# Patient Record
Sex: Male | Born: 1940 | Race: Black or African American | Hispanic: No | Smoking: Former smoker
Health system: Southern US, Community
[De-identification: ages and names within clinical notes are randomized; demographics above are authoritative.]

## PROBLEM LIST (undated history)

## (undated) DIAGNOSIS — I10 Essential (primary) hypertension: Secondary | ICD-10-CM

## (undated) DIAGNOSIS — M889 Osteitis deformans of unspecified bone: Secondary | ICD-10-CM

## (undated) DIAGNOSIS — N200 Calculus of kidney: Secondary | ICD-10-CM

## (undated) DIAGNOSIS — F209 Schizophrenia, unspecified: Secondary | ICD-10-CM

## (undated) DIAGNOSIS — W19XXXA Unspecified fall, initial encounter: Secondary | ICD-10-CM

## (undated) DIAGNOSIS — E7800 Pure hypercholesterolemia, unspecified: Secondary | ICD-10-CM

## (undated) DIAGNOSIS — IMO0001 Reserved for inherently not codable concepts without codable children: Secondary | ICD-10-CM

## (undated) DIAGNOSIS — Z87898 Personal history of other specified conditions: Secondary | ICD-10-CM

## (undated) DIAGNOSIS — K219 Gastro-esophageal reflux disease without esophagitis: Secondary | ICD-10-CM

## (undated) DIAGNOSIS — K8020 Calculus of gallbladder without cholecystitis without obstruction: Secondary | ICD-10-CM

## (undated) DIAGNOSIS — K565 Intestinal adhesions [bands], unspecified as to partial versus complete obstruction: Secondary | ICD-10-CM

## (undated) HISTORY — PX: BACK SURGERY: SHX140

## (undated) HISTORY — DX: Gastro-esophageal reflux disease without esophagitis: K21.9

## (undated) HISTORY — PX: PELVIC FRACTURE SURGERY: SHX119

## (undated) HISTORY — DX: Personal history of other specified conditions: Z87.898

## (undated) HISTORY — DX: Intestinal adhesions (bands), unspecified as to partial versus complete obstruction: K56.50

## (undated) HISTORY — DX: Calculus of kidney: N20.0

## (undated) HISTORY — PX: TOTAL HIP ARTHROPLASTY: SHX124

## (undated) HISTORY — PX: LIVER SURGERY: SHX698

## (undated) HISTORY — DX: Calculus of gallbladder without cholecystitis without obstruction: K80.20

## (undated) HISTORY — DX: Osteitis deformans of unspecified bone: M88.9

---

## 2013-03-16 ENCOUNTER — Emergency Department (HOSPITAL_COMMUNITY): Payer: Medicare (Managed Care)

## 2013-03-16 ENCOUNTER — Encounter (HOSPITAL_COMMUNITY): Payer: Self-pay | Admitting: Emergency Medicine

## 2013-03-16 ENCOUNTER — Inpatient Hospital Stay (HOSPITAL_COMMUNITY)
Admission: EM | Admit: 2013-03-16 | Discharge: 2013-03-27 | DRG: 388 | Disposition: A | Discharge status: Skilled Nursing Facility | Payer: Medicare (Managed Care) | Attending: Internal Medicine | Admitting: Internal Medicine

## 2013-03-16 DIAGNOSIS — E872 Acidosis, unspecified: Secondary | ICD-10-CM | POA: Diagnosis present

## 2013-03-16 DIAGNOSIS — E785 Hyperlipidemia, unspecified: Secondary | ICD-10-CM | POA: Diagnosis present

## 2013-03-16 DIAGNOSIS — F209 Schizophrenia, unspecified: Secondary | ICD-10-CM | POA: Diagnosis present

## 2013-03-16 DIAGNOSIS — Z7902 Long term (current) use of antithrombotics/antiplatelets: Secondary | ICD-10-CM

## 2013-03-16 DIAGNOSIS — Z96649 Presence of unspecified artificial hip joint: Secondary | ICD-10-CM

## 2013-03-16 DIAGNOSIS — K5900 Constipation, unspecified: Secondary | ICD-10-CM | POA: Diagnosis present

## 2013-03-16 DIAGNOSIS — K8020 Calculus of gallbladder without cholecystitis without obstruction: Secondary | ICD-10-CM | POA: Diagnosis present

## 2013-03-16 DIAGNOSIS — I129 Hypertensive chronic kidney disease with stage 1 through stage 4 chronic kidney disease, or unspecified chronic kidney disease: Secondary | ICD-10-CM | POA: Diagnosis present

## 2013-03-16 DIAGNOSIS — Z87891 Personal history of nicotine dependence: Secondary | ICD-10-CM

## 2013-03-16 DIAGNOSIS — Z79899 Other long term (current) drug therapy: Secondary | ICD-10-CM

## 2013-03-16 DIAGNOSIS — E8729 Other acidosis: Secondary | ICD-10-CM | POA: Diagnosis present

## 2013-03-16 DIAGNOSIS — N179 Acute kidney failure, unspecified: Secondary | ICD-10-CM | POA: Diagnosis present

## 2013-03-16 DIAGNOSIS — N289 Disorder of kidney and ureter, unspecified: Secondary | ICD-10-CM

## 2013-03-16 DIAGNOSIS — I959 Hypotension, unspecified: Secondary | ICD-10-CM

## 2013-03-16 DIAGNOSIS — E876 Hypokalemia: Secondary | ICD-10-CM | POA: Diagnosis not present

## 2013-03-16 DIAGNOSIS — I252 Old myocardial infarction: Secondary | ICD-10-CM

## 2013-03-16 DIAGNOSIS — N390 Urinary tract infection, site not specified: Secondary | ICD-10-CM | POA: Diagnosis present

## 2013-03-16 DIAGNOSIS — I10 Essential (primary) hypertension: Secondary | ICD-10-CM

## 2013-03-16 DIAGNOSIS — F2089 Other schizophrenia: Secondary | ICD-10-CM | POA: Diagnosis present

## 2013-03-16 DIAGNOSIS — R339 Retention of urine, unspecified: Secondary | ICD-10-CM | POA: Diagnosis not present

## 2013-03-16 DIAGNOSIS — K56609 Unspecified intestinal obstruction, unspecified as to partial versus complete obstruction: Principal | ICD-10-CM | POA: Diagnosis present

## 2013-03-16 DIAGNOSIS — N2581 Secondary hyperparathyroidism of renal origin: Secondary | ICD-10-CM | POA: Diagnosis present

## 2013-03-16 DIAGNOSIS — N189 Chronic kidney disease, unspecified: Secondary | ICD-10-CM | POA: Diagnosis present

## 2013-03-16 DIAGNOSIS — R17 Unspecified jaundice: Secondary | ICD-10-CM | POA: Diagnosis present

## 2013-03-16 DIAGNOSIS — N200 Calculus of kidney: Secondary | ICD-10-CM

## 2013-03-16 DIAGNOSIS — N170 Acute kidney failure with tubular necrosis: Secondary | ICD-10-CM | POA: Diagnosis present

## 2013-03-16 HISTORY — DX: Essential (primary) hypertension: I10

## 2013-03-16 HISTORY — DX: Schizophrenia, unspecified: F20.9

## 2013-03-16 HISTORY — DX: Pure hypercholesterolemia, unspecified: E78.00

## 2013-03-16 LAB — CBC WITH DIFFERENTIAL/PLATELET
Basophils Absolute: 0 10*3/uL (ref 0.0–0.1)
Eosinophils Absolute: 0.1 10*3/uL (ref 0.0–0.7)
Hemoglobin: 14.1 g/dL (ref 13.0–17.0)
Lymphs Abs: 0.6 10*3/uL — ABNORMAL LOW (ref 0.7–4.0)
MCH: 29.9 pg (ref 26.0–34.0)
MCHC: 36 g/dL (ref 30.0–36.0)
Monocytes Relative: 20 % — ABNORMAL HIGH (ref 3–12)
Neutro Abs: 4.5 10*3/uL (ref 1.7–7.7)
Neutrophils Relative %: 70 % (ref 43–77)
Platelets: 148 10*3/uL — ABNORMAL LOW (ref 150–400)
RBC: 4.72 MIL/uL (ref 4.22–5.81)
WBC: 6.4 10*3/uL (ref 4.0–10.5)

## 2013-03-16 LAB — COMPREHENSIVE METABOLIC PANEL
ALT: 12 U/L (ref 0–53)
AST: 28 U/L (ref 0–37)
Alkaline Phosphatase: 118 U/L — ABNORMAL HIGH (ref 39–117)
CO2: 15 mEq/L — ABNORMAL LOW (ref 19–32)
Chloride: 99 mEq/L (ref 96–112)
GFR calc non Af Amer: 10 mL/min — ABNORMAL LOW (ref >90)
Potassium: 4.4 mEq/L (ref 3.5–5.1)
Sodium: 135 mEq/L (ref 135–145)
Total Bilirubin: 3.3 mg/dL — ABNORMAL HIGH (ref 0.3–1.2)

## 2013-03-16 LAB — URINALYSIS, ROUTINE W REFLEX MICROSCOPIC
Hgb urine dipstick: NEGATIVE
Ketones, ur: 15 mg/dL — AB
Nitrite: POSITIVE — AB
Protein, ur: NEGATIVE mg/dL
Urobilinogen, UA: 1 mg/dL (ref 0.0–1.0)

## 2013-03-16 LAB — URINE MICROSCOPIC-ADD ON

## 2013-03-16 LAB — POCT I-STAT TROPONIN I

## 2013-03-16 MED ORDER — MIRTAZAPINE 30 MG PO TABS
30.0000 mg | ORAL_TABLET | Freq: Every day | ORAL | Status: DC
  Administered 2013-03-17 – 2013-03-26 (×10): 30 mg via ORAL
  Filled 2013-03-16 (×14): qty 1

## 2013-03-16 MED ORDER — SODIUM CHLORIDE 0.9 % IV BOLUS (SEPSIS)
1000.0000 mL | Freq: Once | INTRAVENOUS | Status: AC
  Administered 2013-03-16: 1000 mL via INTRAVENOUS

## 2013-03-16 MED ORDER — SODIUM CHLORIDE 0.9 % IV SOLN
INTRAVENOUS | Status: DC
  Administered 2013-03-16: 18:00:00 via INTRAVENOUS

## 2013-03-16 MED ORDER — ONDANSETRON HCL 4 MG PO TABS: 4.0000 mg | ORAL_TABLET | Freq: Four times a day (QID) | ORAL | Status: DC | PRN

## 2013-03-16 MED ORDER — HEPARIN SODIUM (PORCINE) 5000 UNIT/ML IJ SOLN
5000.0000 [IU] | Freq: Three times a day (TID) | INTRAMUSCULAR | Status: DC
  Administered 2013-03-17 – 2013-03-27 (×32): 5000 [IU] via SUBCUTANEOUS
  Filled 2013-03-16 (×39): qty 1

## 2013-03-16 MED ORDER — RISPERIDONE 3 MG PO TABS
3.0000 mg | ORAL_TABLET | Freq: Every day | ORAL | Status: DC
  Administered 2013-03-17 – 2013-03-26 (×10): 3 mg via ORAL
  Filled 2013-03-16 (×15): qty 1

## 2013-03-16 MED ORDER — ONDANSETRON HCL 4 MG/2ML IJ SOLN
4.0000 mg | Freq: Once | INTRAMUSCULAR | Status: AC
  Administered 2013-03-16: 4 mg via INTRAVENOUS
  Filled 2013-03-16: qty 2

## 2013-03-16 MED ORDER — ONDANSETRON HCL 4 MG/2ML IJ SOLN: 4.0000 mg | Freq: Four times a day (QID) | INTRAMUSCULAR | Status: DC | PRN

## 2013-03-16 MED ORDER — SODIUM CHLORIDE 0.9 % IV SOLN
INTRAVENOUS | Status: DC
  Administered 2013-03-17: 02:00:00 via INTRAVENOUS

## 2013-03-16 MED ORDER — DEXTROSE 5 % IV SOLN
1.0000 g | Freq: Once | INTRAVENOUS | Status: AC
  Administered 2013-03-16: 1 g via INTRAVENOUS
  Filled 2013-03-16: qty 10

## 2013-03-16 MED ORDER — FENTANYL CITRATE 0.05 MG/ML IJ SOLN
12.5000 ug | Freq: Once | INTRAMUSCULAR | Status: AC
  Administered 2013-03-16: 12.5 ug via INTRAVENOUS
  Filled 2013-03-16: qty 2

## 2013-03-16 MED ORDER — SODIUM CHLORIDE 0.9 % IV SOLN: INTRAVENOUS | Status: DC

## 2013-03-16 MED ORDER — PANTOPRAZOLE SODIUM 40 MG PO TBEC: 40.0000 mg | DELAYED_RELEASE_TABLET | Freq: Every day | ORAL | Status: DC

## 2013-03-16 NOTE — Consult Note (Signed)
Reason for Consult:SBO Referring Physician: Dr Gwyneth Sprout  Norman Davis is an 72 y.o. male.  HPI: pt is a poor historian.  Asked to see at request of Dr Anitra Lauth for abdominal pain.  Pt states 3 week history of abdominal pain diffuse constant.  Last BM two  weeks ago.  Nausea and vomiting. History fragmented and he is unclear on the events. Previous surgery includes some sort of liver  And  bowel resection after accident. CT shows SBO and a lot of stool in colon as well.  Not clear if he is having flatus.    Past Medical History  Diagnosis Date  . Hypertension   . Hypercholesteremia   . Myocardial infarction   . Schizophrenia     Past Surgical History  Procedure Laterality Date  . Total hip arthroplasty    . Pelvic fracture surgery    . Liver surgery      History reviewed. No pertinent family history.  Social History:  reports that he has quit smoking. He has never used smokeless tobacco. He reports that he does not drink alcohol or use illicit drugs.  Allergies:  Allergies  Allergen Reactions  . Aspirin     unknown  . Penicillins     unknown    Medications: I have reviewed the patient's current medications.  Results for orders placed during the hospital encounter of 03/16/13 (from the past 48 hour(s))  CBC WITH DIFFERENTIAL     Status: Abnormal   Collection Time    03/16/13 12:19 PM      Result Value Range   WBC 6.4  4.0 - 10.5 K/uL   RBC 4.72  4.22 - 5.81 MIL/uL   Hemoglobin 14.1  13.0 - 17.0 g/dL   HCT 16.1  09.6 - 04.5 %   MCV 83.1  78.0 - 100.0 fL   MCH 29.9  26.0 - 34.0 pg   MCHC 36.0  30.0 - 36.0 g/dL   RDW 40.9  81.1 - 91.4 %   Platelets 148 (*) 150 - 400 K/uL   Neutrophils Relative % 70  43 - 77 %   Neutro Abs 4.5  1.7 - 7.7 K/uL   Lymphocytes Relative 9 (*) 12 - 46 %   Lymphs Abs 0.6 (*) 0.7 - 4.0 K/uL   Monocytes Relative 20 (*) 3 - 12 %   Monocytes Absolute 1.3 (*) 0.1 - 1.0 K/uL   Eosinophils Relative 1  0 - 5 %   Eosinophils Absolute 0.1   0.0 - 0.7 K/uL   Basophils Relative 0  0 - 1 %   Basophils Absolute 0.0  0.0 - 0.1 K/uL   WBC Morphology INCREASED BANDS (>20% BANDS)     Comment: MILD LEFT SHIFT (1-5% METAS, OCC MYELO, OCC BANDS)     TOXIC GRANULATION   RBC Morphology BURR CELLS    COMPREHENSIVE METABOLIC PANEL     Status: Abnormal   Collection Time    03/16/13 12:19 PM      Result Value Range   Sodium 135  135 - 145 mEq/L   Potassium 4.4  3.5 - 5.1 mEq/L   Chloride 99  96 - 112 mEq/L   CO2 15 (*) 19 - 32 mEq/L   Glucose, Bld 136 (*) 70 - 99 mg/dL   BUN 88 (*) 6 - 23 mg/dL   Creatinine, Ser 7.82 (*) 0.50 - 1.35 mg/dL   Calcium 7.5 (*) 8.4 - 10.5 mg/dL   Total Protein 7.6  6.0 -  8.3 g/dL   Albumin 3.1 (*) 3.5 - 5.2 g/dL   AST 28  0 - 37 U/L   ALT 12  0 - 53 U/L   Alkaline Phosphatase 118 (*) 39 - 117 U/L   Total Bilirubin 3.3 (*) 0.3 - 1.2 mg/dL   GFR calc non Af Amer 10 (*) >90 mL/min   GFR calc Af Amer 12 (*) >90 mL/min   Comment: (NOTE)     The eGFR has been calculated using the CKD EPI equation.     This calculation has not been validated in all clinical situations.     eGFR's persistently <90 mL/min signify possible Chronic Kidney     Disease.  POCT I-STAT TROPONIN I     Status: None   Collection Time    03/16/13 12:36 PM      Result Value Range   Troponin i, poc 0.02  0.00 - 0.08 ng/mL   Comment 3            Comment: Due to the release kinetics of cTnI,     a negative result within the first hours     of the onset of symptoms does not rule out     myocardial infarction with certainty.     If myocardial infarction is still suspected,     repeat the test at appropriate intervals.  CG4 I-STAT (LACTIC ACID)     Status: None   Collection Time    03/16/13 12:38 PM      Result Value Range   Lactic Acid, Venous 1.90  0.5 - 2.2 mmol/L  URINALYSIS, ROUTINE W REFLEX MICROSCOPIC     Status: Abnormal   Collection Time    03/16/13  4:02 PM      Result Value Range   Color, Urine RED (*) YELLOW   Comment:  BIOCHEMICALS MAY BE AFFECTED BY COLOR   APPearance CLOUDY (*) CLEAR   Specific Gravity, Urine 1.028  1.005 - 1.030   pH 5.0  5.0 - 8.0   Glucose, UA NEGATIVE  NEGATIVE mg/dL   Hgb urine dipstick NEGATIVE  NEGATIVE   Bilirubin Urine MODERATE (*) NEGATIVE   Ketones, ur 15 (*) NEGATIVE mg/dL   Protein, ur NEGATIVE  NEGATIVE mg/dL   Urobilinogen, UA 1.0  0.0 - 1.0 mg/dL   Nitrite POSITIVE (*) NEGATIVE   Leukocytes, UA SMALL (*) NEGATIVE  URINE MICROSCOPIC-ADD ON     Status: Abnormal   Collection Time    03/16/13  4:02 PM      Result Value Range   Casts HYALINE CASTS (*) NEGATIVE   Comment: GRANULAR CAST   Crystals CA OXALATE CRYSTALS (*) NEGATIVE    Ct Abdomen Pelvis Wo Contrast  03/16/2013   CLINICAL DATA:  Lower abdominal pain, distention, nausea and vomiting, constipation and diarrhea  EXAM: CT ABDOMEN AND PELVIS WITHOUT CONTRAST  TECHNIQUE: Multidetector CT imaging of the abdomen and pelvis was performed following the standard protocol without intravenous contrast.  COMPARISON:  Left rib radiographic series-earlier same day; abdominal radiographs - earlier same day  FINDINGS: Nearly all of the ingested enteric contrast remains within the stomach and duodenum. There is moderate-to-marked fluid in gaseous distention of the small bowel all with abrupt transition point suspected within the central aspect of the left mid abdomen (axial image 63, series 2; coronal images the 114 and 118) with associated decompression of the more distal downstream loops of small bowel, findings worrisome for small bowel obstruction.  Additionally, there is a large  amount of stool colon with associated apparent wall thickening primarily involving the descending and sigmoid colon (representative axial images 59, 60, and 73). No pneumoperitoneum, pneumatosis or portal venous gas. Normal noncontrast appearance of the appendix. No definable fluid collection within the abdomen or pelvis.  Normal hepatic contour. Layering  gallstones within a distended but otherwise normal-appearing gallbladder. No definite pericholecystic fluid. No ascites.  There is an approximately 0.6 x 0.4 cm nonobstructing stone within the mid aspect of the right kidney (image 42, series 2). No definite left-sided renal stones. No stones are seen along the expected course of either ureter or within the urinary bladder which is underdistended. Normal noncontrast appearance of the bilateral adrenal glands. The pancreas is largely atrophic. Normal noncontrast appearance of the spleen.  Scattered atherosclerotic plaque within normal-caliber abdominal aorta. An IVC filter is tilted within in the infrarenal IVC. Several of the legs of the IVC filter extend beyond the walls of the IVC. There is no associated adjacent stranding or evidence of hemorrhage. Scattered shotty retroperitoneal lymph nodes are not individually not enlarged by size criteria. No definite retroperitoneal, mesenteric, pelvic or inguinal lymphadenopathy on this noncontrast examination.  Limited visualization of the lower thorax demonstrates advanced mixed centrilobular and paraseptal emphysematous change. There are 2 adjacent pneumatic seal/ cysts adjacent to the right heart border with dominant cysts measuring approximately 4.8 x 3.2 cm. No definite pneumothorax. There is minimal subsegmental atelectasis within the imaged bilateral lung bases, left greater than right. No definite pleural effusion. Borderline cardiomegaly. Coronary artery calcifications. No pericardial effusion.  No acute osseous abnormalities with special attention paid to the imaged right-sided ribs. 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  IMPRESSION: 1. Findings worrisome for small bowel obstruction with apparent transition point located within the ventral left mid hemi abdomen,  the etiology of which is not depicted on this examination and thus presumably secondary to adhesions. 2. Large amount of stool within the colon with associated nonspecific wall thickening of primarily involving the descending and sigmoid colon. Correlation for symptoms of concomitant enteritis is recommended. Further evaluation with colonoscopy after the resolution of acute symptoms may be performed as clinically indicated 3. 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 4. Right-sided nonobstructing nephrolithiasis. 5. Cholelithiasis without evidence of cholecystitis on this noncontrast examination.   Electronically Signed   By: Simonne Come M.D.   On: 03/16/2013 17:09   Dg Ribs Unilateral W/chest Left  03/16/2013   CLINICAL DATA:  Left rib pain.  EXAM: LEFT RIBS AND CHEST - 3+ VIEW  COMPARISON:  None.  FINDINGS: No fracture or other bone lesions are seen involving the ribs. There is no evidence of pneumothorax or pleural effusion. There is no focal consolidation, pleural effusion or pneumothorax. There is bilateral diffuse mild interstitial thickening likely chronic. Heart size and mediastinal contours are within normal limits.  IMPRESSION: No acute osseous injury of the left ribs.   Electronically Signed   By: Elige Ko   On: 03/16/2013 14:03   Dg Hip Complete Left  03/16/2013   CLINICAL DATA:  Recent fall 1 week on the left side from bed. Most painful along the left lateral aspect of the hip.  EXAM: LEFT HIP - COMPLETE 2+ VIEW  COMPARISON:  None.  FINDINGS: There is posttraumatic deformity of the knee. Left ilium with numerous screws and multiple  malleable plates transfixing a healed ilium fracture. There are at least 2 screws which traverse the left sacroiliac joint. There is posterior spinal fusion at L5-S1.  There is generalized osteopenia. There are moderate degenerative changes of bilateral hips, left greater than right. There is no acute fracture or  dislocation.  There is relative trabecular thickening of free right proximal femur and the pelvis as can be seen with Paget's disease.  IMPRESSION: No acute osseous injury of the left hip.  Prior posttraumatic deformity with internal fixation of the left ilium.   Electronically Signed   By: Elige Ko   On: 03/16/2013 14:00   Dg Abd 2 Views  03/16/2013   CLINICAL DATA:  Distended abdomen.  EXAM: ABDOMEN - 2 VIEW  COMPARISON:  None.  FINDINGS: There is gaseous distention of small bowel and colon. There is no pneumatosis, pneumoperitoneum or portal venous gas. There are no pathologic calcifications.  An IVC filter is noted. Again noted is orthopedic hardware transfixing a healed iliac fracture and transfixing the left sacroiliac joint. There is posterior spinal fixation hardware at L5-S1. There is trabecular thickening of the right proximal femur and pelvis as can be seen with Paget's disease.  IMPRESSION: Nonspecific gaseous distention of small bowel and colon which may reflect an ileus versus low bowel obstruction.   Electronically Signed   By: Elige Ko   On: 03/16/2013 14:02    Review of Systems  Unable to perform ROS  Blood pressure 112/68, pulse 110, temperature 98.3 F (36.8 C), temperature source Oral, resp. rate 20, SpO2 93.00%. Physical Exam  Constitutional: He is oriented to person, place, and time. No distress.  HENT:  Head: Normocephalic and atraumatic.  Eyes: No scleral icterus.  Neck: Normal range of motion. Neck supple.  Cardiovascular: Tachycardia present.   No murmur heard. Respiratory: Effort normal and breath sounds normal.  GI: He exhibits distension. There is generalized tenderness. There is no rigidity, no rebound and no guarding. No hernia.  Musculoskeletal: Normal range of motion.  Neurological: He is alert and oriented to person, place, and time.  Skin: Skin is warm and dry.  Psychiatric: His affect is blunt. His speech is delayed. He is slowed. Cognition and  memory are impaired. He exhibits abnormal recent memory.    Assessment/Plan: SBO ARF ?CRI  Schizophrenia  Dehydration History of traumatic accident requiring abdominal surgery Needs NGT and fluid replacement Needs IM care Repeat films in am NPO No peritonitis at this point. Question constipation as well.    Elvan Ebron A. 03/16/2013, 5:53 PM
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
## 2013-03-16 NOTE — ED Notes (Signed)
Patient is unable to give an urine specimen at this time. The patient has been advised to use call light for assistance. The tech has reported to the RN in charge.
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
## 2013-03-16 NOTE — ED Notes (Signed)
1st cup of contrast completed; starting 2nd CT contrast cup

## 2013-03-16 NOTE — Progress Notes (Signed)
Pt. pulled NGT out stated "it was chocking me and I feel better now" not open for reinsertion at this time...will try again later

## 2013-03-16 NOTE — ED Provider Notes (Addendum)
CSN: [id number]     Arrival date & time 03/16/13  1203 History   First MD Initiated Contact with Patient 03/16/13 1204     Chief Complaint  Patient presents with  . Abdominal Pain   (Consider location/radiation/quality/duration/timing/severity/associated sxs/prior Treatment) Patient is a 72 y.o. male presenting with abdominal pain and fall. The history is provided by the patient.  Abdominal Pain Pain location:  Generalized Pain quality: aching, bloating, cramping and fullness   Pain radiates to:  Does not radiate Pain severity:  Severe Onset quality:  Gradual Duration:  1 week Timing:  Constant Progression:  Worsening Chronicity:  New Context comment:  Worse with eating and has not had BM for 1 week Relieved by:  Nothing Worsened by:  Eating Ineffective treatments:  Vomiting Associated symptoms: anorexia, constipation, flatus, nausea, shortness of breath and vomiting   Associated symptoms: no chest pain, no cough, no diarrhea and no dysuria   Risk factors: being elderly   Risk factors: no alcohol abuse and no NSAID use   Fall This is a new problem. The current episode started yesterday. The problem has been resolved. Associated symptoms include abdominal pain and shortness of breath. Pertinent negatives include no chest pain. Associated symptoms comments: Larey Seat out of bed yesterday and since that time has had left rib pain and left hip pain. The symptoms are aggravated by walking. The treatment provided no relief.    No past medical history on file. No past surgical history on file. No family history on file. History  Substance Use Topics  . Smoking status: Not on file  . Smokeless tobacco: Not on file  . Alcohol Use: Not on file    Review of Systems  Respiratory: Positive for shortness of breath. Negative for cough.   Cardiovascular: Negative for chest pain.  Gastrointestinal: Positive for nausea, vomiting, abdominal pain, constipation, anorexia and flatus. Negative for  diarrhea.  Genitourinary: Negative for dysuria.  All other systems reviewed and are negative.    Allergies  Review of patient's allergies indicates not on file.  Home Medications   Current Outpatient Rx  Name  Route  Sig  Dispense  Refill  . amLODipine (NORVASC) 10 MG tablet   Oral   Take 10 mg by mouth daily.         . brimonidine (ALPHAGAN) 0.2 % ophthalmic solution   Both Eyes   Place 1 drop into both eyes 2 (two) times daily.         . clopidogrel (PLAVIX) 75 MG tablet   Oral   Take 75 mg by mouth daily with breakfast.         . doxazosin (CARDURA) 8 MG tablet   Oral   Take 8 mg by mouth daily.         . finasteride (PROSCAR) 5 MG tablet   Oral   Take 5 mg by mouth daily.         . mirtazapine (REMERON) 30 MG tablet   Oral   Take 30 mg by mouth at bedtime.         . pantoprazole (PROTONIX) 40 MG tablet   Oral   Take 40 mg by mouth daily.         . pravastatin (PRAVACHOL) 40 MG tablet   Oral   Take 40 mg by mouth daily.         . risperiDONE (RISPERDAL) 3 MG tablet   Oral   Take 3 mg by mouth at bedtime.         Marland Kitchen  senna (SENOKOT) 8.6 MG TABS tablet   Oral   Take 1 tablet by mouth daily.         . sulindac (CLINORIL) 200 MG tablet   Oral   Take 200 mg by mouth 2 (two) times daily.         . traZODone (DESYREL) 50 MG tablet   Oral   Take 50 mg by mouth at bedtime.         . trospium (SANCTURA) 20 MG tablet   Oral   Take 20 mg by mouth 2 (two) times daily.          BP 88/72  Pulse 110  Temp(Src) 98.3 F (36.8 C) (Oral)  Resp 19  SpO2 94% Physical Exam  Nursing note and vitals reviewed. Constitutional: He is oriented to person, place, and time. He appears well-developed and well-nourished. No distress.  HENT:  Head: Normocephalic and atraumatic.  Mouth/Throat: Oropharynx is clear and moist.  Eyes: Conjunctivae and EOM are normal. Pupils are equal, round, and reactive to light.  Neck: Normal range of motion. Neck  supple.  Cardiovascular: Regular rhythm and intact distal pulses.  Tachycardia present.   No murmur heard. Pulmonary/Chest: Effort normal and breath sounds normal. No respiratory distress. He has no wheezes. He has no rales. He exhibits tenderness. He exhibits no crepitus.    Abdominal: Soft. He exhibits distension. Bowel sounds are absent. There is tenderness. There is no rebound and no guarding.  Musculoskeletal: Normal range of motion. He exhibits tenderness. He exhibits no edema.       Left hip: He exhibits tenderness and bony tenderness. He exhibits normal range of motion and no deformity.  Pt can flex/extend the hip and pain with internal and external rotation  Neurological: He is alert and oriented to person, place, and time.  Skin: Skin is warm and dry. No rash noted. No erythema.  Psychiatric: He has a normal mood and affect. His behavior is normal.    ED Course  Procedures (including critical care time) Labs Review Labs Reviewed  CBC WITH DIFFERENTIAL - Abnormal; Notable for the following:    Platelets 148 (*)    Lymphocytes Relative 9 (*)    Lymphs Abs 0.6 (*)    Monocytes Relative 20 (*)    Monocytes Absolute 1.3 (*)    All other components within normal limits  COMPREHENSIVE METABOLIC PANEL - Abnormal; Notable for the following:    CO2 15 (*)    Glucose, Bld 136 (*)    BUN 88 (*)    Creatinine, Ser 4.93 (*)    Calcium 7.5 (*)    Albumin 3.1 (*)    Alkaline Phosphatase 118 (*)    Total Bilirubin 3.3 (*)    GFR calc non Af Amer 10 (*)    GFR calc Af Amer 12 (*)    All other components within normal limits  URINALYSIS, ROUTINE W REFLEX MICROSCOPIC  POCT I-STAT TROPONIN I  CG4 I-STAT (LACTIC ACID)   Imaging Review Ct Abdomen Pelvis Wo Contrast  03/16/2013   CLINICAL DATA:  Lower abdominal pain, distention, nausea and vomiting, constipation and diarrhea  EXAM: CT ABDOMEN AND PELVIS WITHOUT CONTRAST  TECHNIQUE: Multidetector CT imaging of the abdomen and pelvis was  performed following the standard protocol without intravenous contrast.  COMPARISON:  Left rib radiographic series-earlier same day; abdominal radiographs - earlier same day  FINDINGS: Nearly all of the ingested enteric contrast remains within the stomach and duodenum. There is moderate-to-marked fluid in  gaseous distention of the small bowel all with abrupt transition point suspected within the central aspect of the left mid abdomen (axial image 63, series 2; coronal images the 114 and 118) with associated decompression of the more distal downstream loops of small bowel, findings worrisome for small bowel obstruction.  Additionally, there is a large amount of stool colon with associated apparent wall thickening primarily involving the descending and sigmoid colon (representative axial images 59, 60, and 73). No pneumoperitoneum, pneumatosis or portal venous gas. Normal noncontrast appearance of the appendix. No definable fluid collection within the abdomen or pelvis.  Normal hepatic contour. Layering gallstones within a distended but otherwise normal-appearing gallbladder. No definite pericholecystic fluid. No ascites.  There is an approximately 0.6 x 0.4 cm nonobstructing stone within the mid aspect of the right kidney (image 42, series 2). No definite left-sided renal stones. No stones are seen along the expected course of either ureter or within the urinary bladder which is underdistended. Normal noncontrast appearance of the bilateral adrenal glands. The pancreas is largely atrophic. Normal noncontrast appearance of the spleen.  Scattered atherosclerotic plaque within normal-caliber abdominal aorta. An IVC filter is tilted within in the infrarenal IVC. Several of the legs of the IVC filter extend beyond the walls of the IVC. There is no associated adjacent stranding or evidence of hemorrhage. Scattered shotty retroperitoneal lymph nodes are not individually not enlarged by size criteria. No definite  retroperitoneal, mesenteric, pelvic or inguinal lymphadenopathy on this noncontrast examination.  Limited visualization of the lower thorax demonstrates advanced mixed centrilobular and paraseptal emphysematous change. There are 2 adjacent pneumatic seal/ cysts adjacent to the right heart border with dominant cysts measuring approximately 4.8 x 3.2 cm. No definite pneumothorax. There is minimal subsegmental atelectasis within the imaged bilateral lung bases, left greater than right. No definite pleural effusion. Borderline cardiomegaly. Coronary artery calcifications. No pericardial effusion.  No acute osseous abnormalities with special attention paid to the imaged right-sided ribs. 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  IMPRESSION: 1. Findings worrisome for small bowel obstruction with apparent transition point located within the ventral left mid hemi abdomen, the etiology of which is not depicted on this examination and thus presumably secondary to adhesions. 2. Large amount of stool within the colon with associated nonspecific wall thickening of primarily involving the descending and sigmoid colon. Correlation for symptoms of concomitant enteritis is recommended. Further evaluation with colonoscopy after the resolution of acute symptoms may be performed as clinically indicated 3. 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 4. Right-sided nonobstructing nephrolithiasis. 5. Cholelithiasis without evidence of cholecystitis on this noncontrast examination.   Electronically Signed   By: Simonne Come M.D.   On: 03/16/2013 17:09   Dg Ribs Unilateral W/chest Left  03/16/2013   CLINICAL DATA:  Left rib pain.  EXAM: LEFT RIBS AND CHEST - 3+ VIEW  COMPARISON:  None.  FINDINGS: No fracture or other bone lesions are seen  involving the ribs. There is no evidence of pneumothorax or pleural effusion. There is no focal consolidation, pleural effusion or pneumothorax. There is bilateral diffuse mild interstitial thickening likely chronic. Heart size and mediastinal contours are within normal limits.  IMPRESSION: No acute osseous injury of the left ribs.   Electronically Signed   By: Elige Ko   On: 03/16/2013 14:03   Dg Hip Complete  Left  03/16/2013   CLINICAL DATA:  Recent fall 1 week on the left side from bed. Most painful along the left lateral aspect of the hip.  EXAM: LEFT HIP - COMPLETE 2+ VIEW  COMPARISON:  None.  FINDINGS: There is posttraumatic deformity of the knee. Left ilium with numerous screws and multiple malleable plates transfixing a healed ilium fracture. There are at least 2 screws which traverse the left sacroiliac joint. There is posterior spinal fusion at L5-S1.  There is generalized osteopenia. There are moderate degenerative changes of bilateral hips, left greater than right. There is no acute fracture or dislocation.  There is relative trabecular thickening of free right proximal femur and the pelvis as can be seen with Paget's disease.  IMPRESSION: No acute osseous injury of the left hip.  Prior posttraumatic deformity with internal fixation of the left ilium.   Electronically Signed   By: Elige Ko   On: 03/16/2013 14:00   Dg Abd 2 Views  03/16/2013   CLINICAL DATA:  Distended abdomen.  EXAM: ABDOMEN - 2 VIEW  COMPARISON:  None.  FINDINGS: There is gaseous distention of small bowel and colon. There is no pneumatosis, pneumoperitoneum or portal venous gas. There are no pathologic calcifications.  An IVC filter is noted. Again noted is orthopedic hardware transfixing a healed iliac fracture and transfixing the left sacroiliac joint. There is posterior spinal fixation hardware at L5-S1. There is trabecular thickening of the right proximal femur and pelvis as can be seen with Paget's disease.   IMPRESSION: Nonspecific gaseous distention of small bowel and colon which may reflect an ileus versus low bowel obstruction.   Electronically Signed   By: Elige Ko   On: 03/16/2013 14:02    EKG Interpretation     Ventricular Rate:  109 PR Interval:  158 QRS Duration: 148 QT Interval:  387 QTC Calculation: 521 R Axis:   -101 Text Interpretation:  Sinus tachycardia Nonspecific IVCD with LAD roaming baseline Nonspecific ST abnormality No previous tracing            MDM   1. Renal insufficiency   2. Small bowel obstruction   3. Hypotension     Patient presented with symptoms concerning for a bowel obstruction with abdominal distention, vomiting and no bowel movement for one week. She is tachycardic and hypotensive and states that he has not eaten in several days. However he has normal mental status denies chest pain but is complaining of mild shortness of breath. 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 Also he fell out of bed yesterday and is complaining of left hip and left rib pain. He denies any leg pain and can range his left hip with mild tenderness and no deformity.  Possible injury from father bed will image the left hip and left ribs. Abdominal series, CBC, CMP, UA, troponin, lactate pending. EKG shows right bundle branch block with roaming baseline. Troponin is within normal limits and lactate is within normal limits. Patient started on IV fluids hopefully improved blood pressure and tachycardia.  2:25 PM Labs show renal failure however unclear what baseline creatinine is (pt gets most of his care at the [place]).  Plain films concerning for obstruction.  CT ordered.  CXR and hip films neg for acute injury.  5:14 PM Ct with signs of SBO most likely from adhesion and NGT will be placed.  Will discuss with surgery.  UA also consistent with UTI and will give rocephin.   After IVF BP improved to 120's systolic.    5:37 PM Spoke with Dr. Luisa Hart with surgery and they will consult on the pt.  Gwyneth Sprout, MD 03/16/13 1715  Gwyneth Sprout, MD 03/16/13 1737  Gwyneth Sprout, MD 03/16/13 [phone]

## 2013-03-16 NOTE — ED Notes (Signed)
NOTIFIED DR. PLUNKETT IN PERSON OF PATIENTS LAB RESULTS OF CG4 LACTIC ACID 1.90 mmoI/L @12 :55PM , 03/16/2013.

## 2013-03-17 ENCOUNTER — Inpatient Hospital Stay (HOSPITAL_COMMUNITY): Payer: Medicare (Managed Care)

## 2013-03-17 DIAGNOSIS — N200 Calculus of kidney: Secondary | ICD-10-CM

## 2013-03-17 DIAGNOSIS — R109 Unspecified abdominal pain: Secondary | ICD-10-CM

## 2013-03-17 DIAGNOSIS — E872 Acidosis: Secondary | ICD-10-CM

## 2013-03-17 DIAGNOSIS — K8020 Calculus of gallbladder without cholecystitis without obstruction: Secondary | ICD-10-CM | POA: Insufficient documentation

## 2013-03-17 DIAGNOSIS — K56609 Unspecified intestinal obstruction, unspecified as to partial versus complete obstruction: Secondary | ICD-10-CM

## 2013-03-17 DIAGNOSIS — N179 Acute kidney failure, unspecified: Secondary | ICD-10-CM

## 2013-03-17 LAB — BASIC METABOLIC PANEL
CO2: 15 mEq/L — ABNORMAL LOW (ref 19–32)
Calcium: 6.6 mg/dL — ABNORMAL LOW (ref 8.4–10.5)
GFR calc Af Amer: 15 mL/min — ABNORMAL LOW (ref >90)
GFR calc non Af Amer: 13 mL/min — ABNORMAL LOW (ref >90)
Potassium: 3.2 mEq/L — ABNORMAL LOW (ref 3.5–5.1)
Sodium: 139 mEq/L (ref 135–145)

## 2013-03-17 LAB — COMPREHENSIVE METABOLIC PANEL
ALT: 14 U/L (ref 0–53)
AST: 31 U/L (ref 0–37)
Albumin: 2.6 g/dL — ABNORMAL LOW (ref 3.5–5.2)
Alkaline Phosphatase: 115 U/L (ref 39–117)
BUN: 99 mg/dL — ABNORMAL HIGH (ref 6–23)
Chloride: 103 mEq/L (ref 96–112)
GFR calc non Af Amer: 12 mL/min — ABNORMAL LOW (ref >90)
Potassium: 4.5 mEq/L (ref 3.5–5.1)
Total Protein: 7.2 g/dL (ref 6.0–8.3)

## 2013-03-17 LAB — CBC
HCT: 37.1 % — ABNORMAL LOW (ref 39.0–52.0)
MCH: 28.9 pg (ref 26.0–34.0)
MCHC: 34.2 g/dL (ref 30.0–36.0)
MCV: 84.3 fL (ref 78.0–100.0)
Platelets: 195 10*3/uL (ref 150–400)
RBC: 4.4 MIL/uL (ref 4.22–5.81)
WBC: 7.2 10*3/uL (ref 4.0–10.5)

## 2013-03-17 LAB — LACTATE DEHYDROGENASE: LDH: 319 U/L — ABNORMAL HIGH (ref 94–250)

## 2013-03-17 LAB — PHOSPHORUS: Phosphorus: 4.9 mg/dL — ABNORMAL HIGH (ref 2.3–4.6)

## 2013-03-17 LAB — LACTIC ACID, PLASMA: Lactic Acid, Venous: 1.3 mmol/L (ref 0.5–2.2)

## 2013-03-17 MED ORDER — CIPROFLOXACIN IN D5W 400 MG/200ML IV SOLN
400.0000 mg | Freq: Two times a day (BID) | INTRAVENOUS | Status: DC
  Administered 2013-03-17 – 2013-03-21 (×10): 400 mg via INTRAVENOUS
  Filled 2013-03-17 (×12): qty 200

## 2013-03-17 MED ORDER — METRONIDAZOLE IN NACL 5-0.79 MG/ML-% IV SOLN
500.0000 mg | Freq: Three times a day (TID) | INTRAVENOUS | Status: DC
  Administered 2013-03-17 – 2013-03-22 (×14): 500 mg via INTRAVENOUS
  Filled 2013-03-17 (×17): qty 100

## 2013-03-17 MED ORDER — POTASSIUM CHLORIDE 10 MEQ/100ML IV SOLN
10.0000 meq | INTRAVENOUS | Status: AC
  Administered 2013-03-17 (×2): 10 meq via INTRAVENOUS
  Filled 2013-03-17: qty 100

## 2013-03-17 MED ORDER — PANTOPRAZOLE SODIUM 40 MG IV SOLR
40.0000 mg | Freq: Two times a day (BID) | INTRAVENOUS | Status: DC
  Administered 2013-03-17 – 2013-03-23 (×13): 40 mg via INTRAVENOUS
  Filled 2013-03-17 (×15): qty 40

## 2013-03-17 MED ORDER — SODIUM CHLORIDE 0.9 % IV BOLUS (SEPSIS)
250.0000 mL | Freq: Once | INTRAVENOUS | Status: AC
  Administered 2013-03-17: 250 mL via INTRAVENOUS

## 2013-03-17 MED ORDER — MORPHINE SULFATE 2 MG/ML IJ SOLN
0.5000 mg | INTRAMUSCULAR | Status: DC | PRN
  Administered 2013-03-17: 0.5 mg via INTRAVENOUS
  Filled 2013-03-17: qty 1

## 2013-03-17 MED ORDER — PNEUMOCOCCAL VAC POLYVALENT 25 MCG/0.5ML IJ INJ
0.5000 mL | INJECTION | INTRAMUSCULAR | Status: AC
  Administered 2013-03-18: 0.5 mL via INTRAMUSCULAR
  Filled 2013-03-17: qty 0.5

## 2013-03-17 MED ORDER — MORPHINE SULFATE 2 MG/ML IJ SOLN
2.0000 mg | INTRAMUSCULAR | Status: DC | PRN
  Administered 2013-03-18: 2 mg via INTRAVENOUS
  Filled 2013-03-17: qty 1

## 2013-03-17 MED ORDER — INFLUENZA VAC SPLIT QUAD 0.5 ML IM SUSP
0.5000 mL | INTRAMUSCULAR | Status: AC
  Administered 2013-03-18: 0.5 mL via INTRAMUSCULAR
  Filled 2013-03-17: qty 0.5

## 2013-03-17 MED ORDER — STERILE WATER FOR INJECTION IV SOLN: INTRAVENOUS | Status: DC

## 2013-03-17 MED ORDER — SODIUM BICARBONATE 8.4 % IV SOLN
INTRAVENOUS | Status: DC
  Administered 2013-03-17 – 2013-03-19 (×5): via INTRAVENOUS
  Filled 2013-03-17 (×7): qty 100

## 2013-03-17 NOTE — Progress Notes (Signed)
Pt sounds wheezy, ? Fluid overload.  O2 sat = 94%, pulse 106.  O2 started at 2L n/c.  Nurse will notify MD.

## 2013-03-17 NOTE — Progress Notes (Signed)
CRITICAL VALUE ALERT  Critical value received: co2 10 Date of notification:  03/17/2013  Time of notification:  0815  Critical value read back:{yes  Nurse who received alert Terri RN MD notified (1st page):  [place]  Time of first page:  0817  MD notified (2nd page):  Time of second page:  Responding MD:  Sunnie Nielsen  Time MD responded:  [phone]

## 2013-03-17 NOTE — Progress Notes (Signed)
AKI (acute kidney injury)  Assessment: Abd pain , relatively diffuse with: Apparent partial SBO Cholelithiasis slt elevation bilirubin Thickened descending sigmoid colon, large amount of retained feces Renal insufficiency, baseline creatinine unknown Low CO2, repeat lactic acid pending Repeat abd films pending  Plan: Agree with starting antibiotics. Repeat films labs pending. He has bile out NG and over a liter so should have some improvement. Abd still somewhat tender, but wbc is normal.    Subjective: Still with abd pain, not well localized, no nausea with ng  Objective: Vital signs in last 24 hours: Temp:  [97.7 F (36.5 C)-98.3 F (36.8 C)] 97.7 F (36.5 C) (11/16 0440) Pulse Rate:  [104-111] 106 (11/16 0440) Resp:  [16-24] 18 (11/16 0440) BP: (88-128)/(50-92) 100/74 mmHg (11/16 0440) SpO2:  [89 %-100 %] 93 % (11/16 0440) Weight:  [212 lb (96.163 kg)] 212 lb (96.163 kg) (11/15 2052) Last BM Date: 03/16/13  Intake/Output from previous day: 11/15 0701 - 11/16 0700 In: 1333 [I.V.:1333] Out: 1150 [Urine:700; Emesis/NG output:300]  General appearance: alert, cooperative and mild distress Resp: clear to auscultation bilaterally GI: Distended mainly soft, some tender throughout, but more on the right than the left. No BS  Lab Results:  Results for orders placed during the hospital encounter of 03/16/13 (from the past 24 hour(s))  CBC WITH DIFFERENTIAL     Status: Abnormal   Collection Time    03/16/13 12:19 PM      Result Value Range   WBC 6.4  4.0 - 10.5 K/uL   RBC 4.72  4.22 - 5.81 MIL/uL   Hemoglobin 14.1  13.0 - 17.0 g/dL   HCT 86.5  78.4 - 69.6 %   MCV 83.1  78.0 - 100.0 fL   MCH 29.9  26.0 - 34.0 pg   MCHC 36.0  30.0 - 36.0 g/dL   RDW 29.5  28.4 - 13.2 %   Platelets 148 (*) 150 - 400 K/uL   Neutrophils Relative % 70  43 - 77 %   Neutro Abs 4.5  1.7 - 7.7 K/uL   Lymphocytes Relative 9 (*) 12 - 46 %   Lymphs Abs 0.6 (*) 0.7 - 4.0 K/uL   Monocytes Relative  20 (*) 3 - 12 %   Monocytes Absolute 1.3 (*) 0.1 - 1.0 K/uL   Eosinophils Relative 1  0 - 5 %   Eosinophils Absolute 0.1  0.0 - 0.7 K/uL   Basophils Relative 0  0 - 1 %   Basophils Absolute 0.0  0.0 - 0.1 K/uL   WBC Morphology INCREASED BANDS (>20% BANDS)     RBC Morphology BURR CELLS    COMPREHENSIVE METABOLIC PANEL     Status: Abnormal   Collection Time    03/16/13 12:19 PM      Result Value Range   Sodium 135  135 - 145 mEq/L   Potassium 4.4  3.5 - 5.1 mEq/L   Chloride 99  96 - 112 mEq/L   CO2 15 (*) 19 - 32 mEq/L   Glucose, Bld 136 (*) 70 - 99 mg/dL   BUN 88 (*) 6 - 23 mg/dL   Creatinine, Ser 4.40 (*) 0.50 - 1.35 mg/dL   Calcium 7.5 (*) 8.4 - 10.5 mg/dL   Total Protein 7.6  6.0 - 8.3 g/dL   Albumin 3.1 (*) 3.5 - 5.2 g/dL   AST 28  0 - 37 U/L   ALT 12  0 - 53 U/L   Alkaline Phosphatase 118 (*) 39 -  117 U/L   Total Bilirubin 3.3 (*) 0.3 - 1.2 mg/dL   GFR calc non Af Amer 10 (*) >90 mL/min   GFR calc Af Amer 12 (*) >90 mL/min  POCT I-STAT TROPONIN I     Status: None   Collection Time    03/16/13 12:36 PM      Result Value Range   Troponin i, poc 0.02  0.00 - 0.08 ng/mL   Comment 3           CG4 I-STAT (LACTIC ACID)     Status: None   Collection Time    03/16/13 12:38 PM      Result Value Range   Lactic Acid, Venous 1.90  0.5 - 2.2 mmol/L  URINALYSIS, ROUTINE W REFLEX MICROSCOPIC     Status: Abnormal   Collection Time    03/16/13  4:02 PM      Result Value Range   Color, Urine RED (*) YELLOW   APPearance CLOUDY (*) CLEAR   Specific Gravity, Urine 1.028  1.005 - 1.030   pH 5.0  5.0 - 8.0   Glucose, UA NEGATIVE  NEGATIVE mg/dL   Hgb urine dipstick NEGATIVE  NEGATIVE   Bilirubin Urine MODERATE (*) NEGATIVE   Ketones, ur 15 (*) NEGATIVE mg/dL   Protein, ur NEGATIVE  NEGATIVE mg/dL   Urobilinogen, UA 1.0  0.0 - 1.0 mg/dL   Nitrite POSITIVE (*) NEGATIVE   Leukocytes, UA SMALL (*) NEGATIVE  URINE MICROSCOPIC-ADD ON     Status: Abnormal   Collection Time    03/16/13   4:02 PM      Result Value Range   Casts HYALINE CASTS (*) NEGATIVE   Crystals CA OXALATE CRYSTALS (*) NEGATIVE  COMPREHENSIVE METABOLIC PANEL     Status: Abnormal   Collection Time    03/17/13  5:45 AM      Result Value Range   Sodium 135  135 - 145 mEq/L   Potassium 4.5  3.5 - 5.1 mEq/L   Chloride 103  96 - 112 mEq/L   CO2 10 (*) 19 - 32 mEq/L   Glucose, Bld 133 (*) 70 - 99 mg/dL   BUN 99 (*) 6 - 23 mg/dL   Creatinine, Ser 1.61 (*) 0.50 - 1.35 mg/dL   Calcium 6.7 (*) 8.4 - 10.5 mg/dL   Total Protein 7.2  6.0 - 8.3 g/dL   Albumin 2.6 (*) 3.5 - 5.2 g/dL   AST 31  0 - 37 U/L   ALT 14  0 - 53 U/L   Alkaline Phosphatase 115  39 - 117 U/L   Total Bilirubin 2.5 (*) 0.3 - 1.2 mg/dL   GFR calc non Af Amer 12 (*) >90 mL/min   GFR calc Af Amer 14 (*) >90 mL/min  CBC     Status: Abnormal   Collection Time    03/17/13  5:45 AM      Result Value Range   WBC 7.2  4.0 - 10.5 K/uL   RBC 4.40  4.22 - 5.81 MIL/uL   Hemoglobin 12.7 (*) 13.0 - 17.0 g/dL   HCT 09.6 (*) 04.5 - 40.9 %   MCV 84.3  78.0 - 100.0 fL   MCH 28.9  26.0 - 34.0 pg   MCHC 34.2  30.0 - 36.0 g/dL   RDW 81.1 (*) 91.4 - 78.2 %   Platelets 195  150 - 400 K/uL     Studies/Results Radiology     MEDS, Scheduled . ciprofloxacin  400  mg Intravenous Q12H  . heparin  5,000 Units Subcutaneous Q8H  . [START ON 03/18/2013] influenza vac split quadrivalent PF  0.5 mL Intramuscular Tomorrow-1000  . metronidazole  500 mg Intravenous Q8H  . mirtazapine  30 mg Oral QHS  . pantoprazole (PROTONIX) IV  40 mg Intravenous Q12H  . [START ON 03/18/2013] pneumococcal 23 valent vaccine  0.5 mL Intramuscular Tomorrow-1000  . risperiDONE  3 mg Oral QHS  . sodium chloride  250 mL Intravenous Once       LOS: 1 day    Currie Paris, MD, [facility], [place] [phone]   03/17/2013 9:30 AM

## 2013-03-17 NOTE — Consult Note (Signed)
[facility] Renal Consultation Note  Requesting MD: Regalado Indication for Consultation:  Elevated creatinine  HPI:  Norman Davis is a 72 y.o. male with no previous interactions with the [facility] system.  He has a reported PMhx of HTN not on an Ace- schizophrenia, hyperlipidemia and multiple previous abdominal surgeries that started after an accident.  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  He presented to the ER yesterday with complaints of abdominal pain and distention. He had also not been able to keep PO's down.  He continued to take his BP meds.    His creatinine was noted to be 4.93 and baseline is unknown. He says that he goes to the [place] and they do check blood work and he is not aware of any previous kidney weakness.   He had been on sulindac to assist with his abdominal pain and was taking it.  He has now been admitted for a bowel obstruction, had NGT placed with much output.  His initial BP was low SBP of 88, did get fluids overnight and is making reasonable urine.  BP is still soft and he is tachycardic indicating he may still be dry.  He was started on a bicarb drip for metabolic acidosis.  Creatinine today is 4.5.  U/A was negative for blood and protein, he did have some crystals and hyaline casts.  CT scan did show a non obstructing stone on the right.    Creatinine, Ser  Date/Time Value Range Status  03/17/2013  5:45 AM 4.50* 0.50 - 1.35 mg/dL Final  81/19/1478 29:56 PM 4.93* 0.50 - 1.35 mg/dL Final     PMHx:   Past Medical History  Diagnosis Date  . Hypertension   . Hypercholesteremia   . Myocardial infarction   . Schizophrenia     Past Surgical History  Procedure Laterality Date  . Total hip arthroplasty    . Pelvic fracture surgery    . Liver surgery      Family Hx:  Family History  Problem Relation Age of Onset  . Cancer Mother   . Cancer Father     Social History:  reports that he has quit smoking. His  smoking use included Cigarettes. He smoked 1.00 pack per day. He has never used smokeless tobacco. He reports that he does not drink alcohol or use illicit drugs.  Allergies:  Allergies  Allergen Reactions  . Aspirin     unknown  . Penicillins     unknown    Medications: Prior to Admission medications   Medication Sig Start Date End Date Taking? Authorizing Provider  amLODipine (NORVASC) 10 MG tablet Take 10 mg by mouth daily.   Yes Historical Provider, MD  brimonidine (ALPHAGAN) 0.2 % ophthalmic solution Place 1 drop into both eyes 2 (two) times daily.   Yes Historical Provider, MD  clopidogrel (PLAVIX) 75 MG tablet Take 75 mg by mouth daily with breakfast.   Yes Historical Provider, MD  doxazosin (CARDURA) 8 MG tablet Take 8 mg by mouth daily.   Yes Historical Provider, MD  finasteride (PROSCAR) 5 MG tablet Take 5 mg by mouth daily.   Yes Historical Provider, MD  mirtazapine (REMERON) 30 MG tablet Take 30 mg by mouth at bedtime.   Yes Historical Provider, MD  pantoprazole (PROTONIX) 40 MG tablet Take 40 mg by mouth daily.   Yes Historical Provider, MD  pravastatin (PRAVACHOL) 40 MG tablet Take 40 mg by mouth daily.  Yes Historical Provider, MD  risperiDONE (RISPERDAL) 3 MG tablet Take 3 mg by mouth at bedtime.   Yes Historical Provider, MD  senna (SENOKOT) 8.6 MG TABS tablet Take 1 tablet by mouth daily.   Yes Historical Provider, MD  sulindac (CLINORIL) 200 MG tablet Take 200 mg by mouth 2 (two) times daily.   Yes Historical Provider, MD  traZODone (DESYREL) 50 MG tablet Take 50 mg by mouth at bedtime.   Yes Historical Provider, MD  trospium (SANCTURA) 20 MG tablet Take 20 mg by mouth 2 (two) times daily.   Yes Historical Provider, MD    I have reviewed the patient's current medications.  Labs:  Results for orders placed during the hospital encounter of 03/16/13 (from the past 48 hour(s))  CBC WITH DIFFERENTIAL     Status: Abnormal   Collection Time    03/16/13 12:19 PM       Result Value Range   WBC 6.4  4.0 - 10.5 K/uL   RBC 4.72  4.22 - 5.81 MIL/uL   Hemoglobin 14.1  13.0 - 17.0 g/dL   HCT 30.8  65.7 - 84.6 %   MCV 83.1  78.0 - 100.0 fL   MCH 29.9  26.0 - 34.0 pg   MCHC 36.0  30.0 - 36.0 g/dL   RDW 96.2  95.2 - 84.1 %   Platelets 148 (*) 150 - 400 K/uL   Neutrophils Relative % 70  43 - 77 %   Neutro Abs 4.5  1.7 - 7.7 K/uL   Lymphocytes Relative 9 (*) 12 - 46 %   Lymphs Abs 0.6 (*) 0.7 - 4.0 K/uL   Monocytes Relative 20 (*) 3 - 12 %   Monocytes Absolute 1.3 (*) 0.1 - 1.0 K/uL   Eosinophils Relative 1  0 - 5 %   Eosinophils Absolute 0.1  0.0 - 0.7 K/uL   Basophils Relative 0  0 - 1 %   Basophils Absolute 0.0  0.0 - 0.1 K/uL   WBC Morphology INCREASED BANDS (>20% BANDS)     Comment: MILD LEFT SHIFT (1-5% METAS, OCC MYELO, OCC BANDS)     TOXIC GRANULATION   RBC Morphology BURR CELLS    COMPREHENSIVE METABOLIC PANEL     Status: Abnormal   Collection Time    03/16/13 12:19 PM      Result Value Range   Sodium 135  135 - 145 mEq/L   Potassium 4.4  3.5 - 5.1 mEq/L   Chloride 99  96 - 112 mEq/L   CO2 15 (*) 19 - 32 mEq/L   Glucose, Bld 136 (*) 70 - 99 mg/dL   BUN 88 (*) 6 - 23 mg/dL   Creatinine, Ser 3.24 (*) 0.50 - 1.35 mg/dL   Calcium 7.5 (*) 8.4 - 10.5 mg/dL   Total Protein 7.6  6.0 - 8.3 g/dL   Albumin 3.1 (*) 3.5 - 5.2 g/dL   AST 28  0 - 37 U/L   ALT 12  0 - 53 U/L   Alkaline Phosphatase 118 (*) 39 - 117 U/L   Total Bilirubin 3.3 (*) 0.3 - 1.2 mg/dL   GFR calc non Af Amer 10 (*) >90 mL/min   GFR calc Af Amer 12 (*) >90 mL/min   Comment: (NOTE)     The eGFR has been calculated using the CKD EPI equation.     This calculation has not been validated in all clinical situations.     eGFR's persistently <90 mL/min signify possible  Chronic Kidney     Disease.  POCT I-STAT TROPONIN I     Status: None   Collection Time    03/16/13 12:36 PM      Result Value Range   Troponin i, poc 0.02  0.00 - 0.08 ng/mL   Comment 3            Comment: Due to  the release kinetics of cTnI,     a negative result within the first hours     of the onset of symptoms does not rule out     myocardial infarction with certainty.     If myocardial infarction is still suspected,     repeat the test at appropriate intervals.  CG4 I-STAT (LACTIC ACID)     Status: None   Collection Time    03/16/13 12:38 PM      Result Value Range   Lactic Acid, Venous 1.90  0.5 - 2.2 mmol/L  URINALYSIS, ROUTINE W REFLEX MICROSCOPIC     Status: Abnormal   Collection Time    03/16/13  4:02 PM      Result Value Range   Color, Urine RED (*) YELLOW   Comment: BIOCHEMICALS MAY BE AFFECTED BY COLOR   APPearance CLOUDY (*) CLEAR   Specific Gravity, Urine 1.028  1.005 - 1.030   pH 5.0  5.0 - 8.0   Glucose, UA NEGATIVE  NEGATIVE mg/dL   Hgb urine dipstick NEGATIVE  NEGATIVE   Bilirubin Urine MODERATE (*) NEGATIVE   Ketones, ur 15 (*) NEGATIVE mg/dL   Protein, ur NEGATIVE  NEGATIVE mg/dL   Urobilinogen, UA 1.0  0.0 - 1.0 mg/dL   Nitrite POSITIVE (*) NEGATIVE   Leukocytes, UA SMALL (*) NEGATIVE  URINE MICROSCOPIC-ADD ON     Status: Abnormal   Collection Time    03/16/13  4:02 PM      Result Value Range   Casts HYALINE CASTS (*) NEGATIVE   Comment: GRANULAR CAST   Crystals CA OXALATE CRYSTALS (*) NEGATIVE  COMPREHENSIVE METABOLIC PANEL     Status: Abnormal   Collection Time    03/17/13  5:45 AM      Result Value Range   Sodium 135  135 - 145 mEq/L   Potassium 4.5  3.5 - 5.1 mEq/L   Comment: SLIGHT HEMOLYSIS   Chloride 103  96 - 112 mEq/L   CO2 10 (*) 19 - 32 mEq/L   Comment: CRITICAL RESULT CALLED TO, READ BACK BY AND VERIFIED WITH:     DENTONTRN [id number] MCCAULEG   Glucose, Bld 133 (*) 70 - 99 mg/dL   BUN 99 (*) 6 - 23 mg/dL   Creatinine, Ser 2.13 (*) 0.50 - 1.35 mg/dL   Calcium 6.7 (*) 8.4 - 10.5 mg/dL   Total Protein 7.2  6.0 - 8.3 g/dL   Albumin 2.6 (*) 3.5 - 5.2 g/dL   AST 31  0 - 37 U/L   ALT 14  0 - 53 U/L   Alkaline Phosphatase 115  39 - 117 U/L    Total Bilirubin 2.5 (*) 0.3 - 1.2 mg/dL   GFR calc non Af Amer 12 (*) >90 mL/min   GFR calc Af Amer 14 (*) >90 mL/min   Comment: (NOTE)     The eGFR has been calculated using the CKD EPI equation.     This calculation has not been validated in all clinical situations.     eGFR's persistently <90 mL/min signify possible Chronic Kidney  Disease.  CBC     Status: Abnormal   Collection Time    03/17/13  5:45 AM      Result Value Range   WBC 7.2  4.0 - 10.5 K/uL   Comment: WHITE COUNT CONFIRMED ON SMEAR   RBC 4.40  4.22 - 5.81 MIL/uL   Hemoglobin 12.7 (*) 13.0 - 17.0 g/dL   HCT 16.1 (*) 09.6 - 04.5 %   MCV 84.3  78.0 - 100.0 fL   MCH 28.9  26.0 - 34.0 pg   MCHC 34.2  30.0 - 36.0 g/dL   RDW 40.9 (*) 81.1 - 91.4 %   Platelets 195  150 - 400 K/uL   Comment: PLATELET COUNT CONFIRMED BY SMEAR  PHOSPHORUS     Status: Abnormal   Collection Time    03/17/13 10:20 AM      Result Value Range   Phosphorus 4.9 (*) 2.3 - 4.6 mg/dL  LACTIC ACID, PLASMA     Status: None   Collection Time    03/17/13 10:28 AM      Result Value Range   Lactic Acid, Venous 1.3  0.5 - 2.2 mmol/L     ROS:  A comprehensive review of systems was negative except for: Gastrointestinal: positive for abdominal pain and vomiting  Physical Exam: Filed Vitals:   03/17/13 0440  BP: 100/74  Pulse: 106  Temp: 97.7 F (36.5 C)  Resp: 18     General: well developed BM, is alert and can answer my questions.  He has a slightly elevated respiratory rate- NGT in place HEENT: PERRLA, mucous membranes are moist Neck: no jvd, no bruits, no LAD Heart: tachy, no murmer Lungs: mostly clear Abdomen: very distended, rebound tenderness.  Decreased BS Extremities: no obvious edema Skin: warm and dry  Neuro: alert, the remainder of the neurologic exam is nonfocal  Assessment/Plan: 72 year old BM with HTN, schizophrenia and multiple episodes of bowel obstruction presents with recurrent bowel obstruction with volume  depletion/hypotension and what is presumed to be AKI 1.Renal- No baseline information is known regarding kidney function.  Patient reports that he has had part of kidney removed but this is not noted on the CT scan.  He has a fairly bland urinalysis and is non oliguric at this time.  Hopefully these are good signs.  I agree with holding the NSAID as well as his BP meds and hydrating aggressively.  CT scan does not show obstruction.  There are no current indications for HD.  We will continue to follow along with you.   2. Hypertension/volume  - seems dry to me.  I agree with aggressive hydration at this time and holding BP meds 3. Acidosis  - agree with bicarb drip and watching 4. Anemia- not evident at this time, likely because is hemoconcentrated.  Continue to follow   Thank you for this consult.  Renal will continue to follow along with you.    Seynabou Fults A 03/17/2013, 12:59 PM

## 2013-03-17 NOTE — Progress Notes (Signed)
[facility] PROGRESS NOTE  Norman Davis JXB:[id number] DOB: 1941/01/29 DOA: 03/16/2013 PCP: No PCP Per Patient  Assessment/Plan: 1-Acute Renal Failure: unclear if on chronic. Suspect ATN secondary to hypotension, decrease volume, NSAID use. Also component of post obstructive uropathy in setting of SBO. Will change IV fluids to D 5 with bicarb secondary to  decrease CO-2. Awaiting records from [place]. Continue to follow I and O. IV bolus depending on balance and out put. Repeat B-met this afternoon.   2-SBO: continue with NGT, IV fluids, IV protonix. CT show nonspecific wall thickening of primarily involving the descending and sigmoid colon. I will start IV cipro and IV flagyl. Patient will need Colonoscopy at some point.   3-UTI ?: UA with positive nitrates. Will cover with Ciprofloxacin. Follow urine culture.  4- Metabolic acidosis: increase anion gap in setting renal failure. Metabolic acidosis could be related to intestinal loss also. IV fluids with bicarb. Repeat lactic acid. Check phosphorus level.  5-Urinary retention: in setting of SBO. Will insert foley catheter.  6-Schizophrenia in remission: will probably have to hold medications in setting of SBO.  7-Hypertension: hold Norvasc.  8-DVT prophylaxis: Heparin.  9-Hyperbilirubinemia: trending down, could be hepatic congestion. Follow trend.    Code Status: Full Code.  Family Communication: care discussed with patient.  Disposition Plan: Remain inpatient.    Consultants:  [facility]  Renal  Procedures:  none  Antibiotics:  Ciprofloxacin 11-16  Flagyl 11-16  HPI/Subjective: Patient complaining of abdominal pain 8/10, not worse or better than yesterday.  He denies dyspnea.   Objective: Filed Vitals:   03/17/13 0440  BP: 100/74  Pulse: 106  Temp: 97.7 F (36.5 C)  Resp: 18    Intake/Output Summary (Last 24 hours) at 03/17/13 0842 Last data filed at 03/17/13 0500  Gross per 24 hour  Intake   1333 ml  Output    1150 ml  Net    183 ml   Filed Weights   03/16/13 2052  Weight: 96.163 kg (212 lb)    Exam:   General:  Alert, awake, appears acutely  ill, no distress. NG tube in place.   Cardiovascular: S 1, S 2 RRR  Respiratory: bilateral ronchus, crackles.   Abdomen: Bs decrease, distended, generalized tender to palpation.   Musculoskeletal: trace edema.   Data Reviewed: Basic Metabolic Panel:  Recent Labs Lab 03/16/13 1219 03/17/13 0545  NA 135 135  K 4.4 4.5  CL 99 103  CO2 15* 10*  GLUCOSE 136* 133*  BUN 88* 99*  CREATININE 4.93* 4.50*  CALCIUM 7.5* 6.7*   Liver Function Tests:  Recent Labs Lab 03/16/13 1219 03/17/13 0545  AST 28 31  ALT 12 14  ALKPHOS 118* 115  BILITOT 3.3* 2.5*  PROT 7.6 7.2  ALBUMIN 3.1* 2.6*   No results found for this basename: LIPASE, AMYLASE,  in the last 168 hours No results found for this basename: AMMONIA,  in the last 168 hours CBC:  Recent Labs Lab 03/16/13 1219 03/17/13 0545  WBC 6.4 7.2  NEUTROABS 4.5  --   HGB 14.1 12.7*  HCT 39.2 37.1*  MCV 83.1 84.3  PLT 148* 195   Cardiac Enzymes: No results found for this basename: CKTOTAL, CKMB, CKMBINDEX, TROPONINI,  in the last 168 hours BNP (last 3 results) No results found for this basename: PROBNP,  in the last 8760 hours CBG: No results found for this basename: GLUCAP,  in the last 168 hours  No results found for this or any previous  visit (from the past 240 hour(s)).   Studies: Ct Abdomen Pelvis Wo Contrast  03/16/2013   CLINICAL DATA:  Lower abdominal pain, distention, nausea and vomiting, constipation and diarrhea  EXAM: CT ABDOMEN AND PELVIS WITHOUT CONTRAST  TECHNIQUE: Multidetector CT imaging of the abdomen and pelvis was performed following the standard protocol without intravenous contrast.  COMPARISON:  Left rib radiographic series-earlier same day; abdominal radiographs - earlier same day  FINDINGS: Nearly all of the ingested enteric contrast remains within the  stomach and duodenum. There is moderate-to-marked fluid in gaseous distention of the small bowel all with abrupt transition point suspected within the central aspect of the left mid abdomen (axial image 63, series 2; coronal images the 114 and 118) with associated decompression of the more distal downstream loops of small bowel, findings worrisome for small bowel obstruction.  Additionally, there is a large amount of stool colon with associated apparent wall thickening primarily involving the descending and sigmoid colon (representative axial images 59, 60, and 73). No pneumoperitoneum, pneumatosis or portal venous gas. Normal noncontrast appearance of the appendix. No definable fluid collection within the abdomen or pelvis.  Normal hepatic contour. Layering gallstones within a distended but otherwise normal-appearing gallbladder. No definite pericholecystic fluid. No ascites.  There is an approximately 0.6 x 0.4 cm nonobstructing stone within the mid aspect of the right kidney (image 42, series 2). No definite left-sided renal stones. No stones are seen along the expected course of either ureter or within the urinary bladder which is underdistended. Normal noncontrast appearance of the bilateral adrenal glands. The pancreas is largely atrophic. Normal noncontrast appearance of the spleen.  Scattered atherosclerotic plaque within normal-caliber abdominal aorta. An IVC filter is tilted within in the infrarenal IVC. Several of the legs of the IVC filter extend beyond the walls of the IVC. There is no associated adjacent stranding or evidence of hemorrhage. Scattered shotty retroperitoneal lymph nodes are not individually not enlarged by size criteria. No definite retroperitoneal, mesenteric, pelvic or inguinal lymphadenopathy on this noncontrast examination.  Limited visualization of the lower thorax demonstrates advanced mixed centrilobular and paraseptal emphysematous change. There are 2 adjacent pneumatic seal/  cysts adjacent to the right heart border with dominant cysts measuring approximately 4.8 x 3.2 cm. No definite pneumothorax. There is minimal subsegmental atelectasis within the imaged bilateral lung bases, left greater than right. No definite pleural effusion. Borderline cardiomegaly. Coronary artery calcifications. No pericardial effusion.  No acute osseous abnormalities with special attention paid to the imaged right-sided ribs. 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  IMPRESSION: 1. Findings worrisome for small bowel obstruction with apparent transition point located within the ventral left mid hemi abdomen, the etiology of which is not depicted on this examination and thus presumably secondary to adhesions. 2. Large amount of stool within the colon with associated nonspecific wall thickening of primarily involving the descending and sigmoid colon. Correlation for symptoms of concomitant enteritis is recommended. Further evaluation with colonoscopy after the resolution of acute symptoms may be performed as clinically indicated 3. 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 4. Right-sided nonobstructing nephrolithiasis. 5. Cholelithiasis without evidence of cholecystitis on this noncontrast examination.   Electronically Signed   By: Simonne Come M.D.   On: 03/16/2013 17:09   Dg Ribs Unilateral W/chest Left  03/16/2013   CLINICAL DATA:  Left rib  pain.  EXAM: LEFT RIBS AND CHEST - 3+ VIEW  COMPARISON:  None.  FINDINGS: No fracture or other bone lesions are seen involving the ribs. There is no evidence of pneumothorax or pleural effusion. There is no focal consolidation, pleural effusion or pneumothorax. There is bilateral diffuse mild interstitial thickening likely chronic. Heart size and mediastinal contours are within  normal limits.  IMPRESSION: No acute osseous injury of the left ribs.   Electronically Signed   By: Elige Ko   On: 03/16/2013 14:03   Dg Hip Complete Left  03/16/2013   CLINICAL DATA:  Recent fall 1 week on the left side from bed. Most painful along the left lateral aspect of the hip.  EXAM: LEFT HIP - COMPLETE 2+ VIEW  COMPARISON:  None.  FINDINGS: There is posttraumatic deformity of the knee. Left ilium with numerous screws and multiple malleable plates transfixing a healed ilium fracture. There are at least 2 screws which traverse the left sacroiliac joint. There is posterior spinal fusion at L5-S1.  There is generalized osteopenia. There are moderate degenerative changes of bilateral hips, left greater than right. There is no acute fracture or dislocation.  There is relative trabecular thickening of free right proximal femur and the pelvis as can be seen with Paget's disease.  IMPRESSION: No acute osseous injury of the left hip.  Prior posttraumatic deformity with internal fixation of the left ilium.   Electronically Signed   By: Elige Ko   On: 03/16/2013 14:00   Dg Abd 2 Views  03/16/2013   CLINICAL DATA:  Distended abdomen.  EXAM: ABDOMEN - 2 VIEW  COMPARISON:  None.  FINDINGS: There is gaseous distention of small bowel and colon. There is no pneumatosis, pneumoperitoneum or portal venous gas. There are no pathologic calcifications.  An IVC filter is noted. Again noted is orthopedic hardware transfixing a healed iliac fracture and transfixing the left sacroiliac joint. There is posterior spinal fixation hardware at L5-S1. There is trabecular thickening of the right proximal femur and pelvis as can be seen with Paget's disease.  IMPRESSION: Nonspecific gaseous distention of small bowel and colon which may reflect an ileus versus low bowel obstruction.   Electronically Signed   By: Elige Ko   On: 03/16/2013 14:02    Scheduled Meds: . ciprofloxacin  400 mg Intravenous Q12H  . heparin   5,000 Units Subcutaneous Q8H  . [START ON 03/18/2013] influenza vac split quadrivalent PF  0.5 mL Intramuscular Tomorrow-1000  . metronidazole  500 mg Intravenous Q8H  . mirtazapine  30 mg Oral QHS  . pantoprazole (PROTONIX) IV  40 mg Intravenous Q12H  . [START ON 03/18/2013] pneumococcal 23 valent vaccine  0.5 mL Intramuscular Tomorrow-1000  . risperiDONE  3 mg Oral QHS   Continuous Infusions:   Principal Problem:   AKI (acute kidney injury) Active Problems:   SBO (small bowel obstruction)   High anion gap metabolic acidosis   Schizophrenia in remission    Time spent: 35 minutes.     Allissa Albright  [facility] Pager [phone]. If 7PM-7AM, please contact night-coverage at [email or web address], password [facility] 03/17/2013, 8:42 AM  LOS: 1 day

## 2013-03-18 ENCOUNTER — Inpatient Hospital Stay (HOSPITAL_COMMUNITY): Payer: Medicare (Managed Care)

## 2013-03-18 LAB — COMPREHENSIVE METABOLIC PANEL
ALT: 14 U/L (ref 0–53)
Albumin: 2.2 g/dL — ABNORMAL LOW (ref 3.5–5.2)
Alkaline Phosphatase: 95 U/L (ref 39–117)
BUN: 80 mg/dL — ABNORMAL HIGH (ref 6–23)
CO2: 18 mEq/L — ABNORMAL LOW (ref 19–32)
Calcium: 7 mg/dL — ABNORMAL LOW (ref 8.4–10.5)
GFR calc Af Amer: 26 mL/min — ABNORMAL LOW (ref >90)
GFR calc non Af Amer: 22 mL/min — ABNORMAL LOW (ref >90)
Potassium: 3.1 mEq/L — ABNORMAL LOW (ref 3.5–5.1)
Sodium: 142 mEq/L (ref 135–145)
Total Protein: 6.3 g/dL (ref 6.0–8.3)

## 2013-03-18 LAB — CBC
Hemoglobin: 12 g/dL — ABNORMAL LOW (ref 13.0–17.0)
MCH: 28.7 pg (ref 26.0–34.0)
MCV: 82.3 fL (ref 78.0–100.0)
Platelets: 136 10*3/uL — ABNORMAL LOW (ref 150–400)
RBC: 4.18 MIL/uL — ABNORMAL LOW (ref 4.22–5.81)
WBC: 6.3 10*3/uL (ref 4.0–10.5)

## 2013-03-18 LAB — URINE CULTURE

## 2013-03-18 LAB — MAGNESIUM: Magnesium: 2.8 mg/dL — ABNORMAL HIGH (ref 1.5–2.5)

## 2013-03-18 MED ORDER — POTASSIUM CHLORIDE 10 MEQ/100ML IV SOLN
10.0000 meq | INTRAVENOUS | Status: AC
  Administered 2013-03-18 (×3): 10 meq via INTRAVENOUS
  Filled 2013-03-18: qty 100

## 2013-03-18 MED ORDER — FLUCONAZOLE 100MG IVPB
100.0000 mg | INTRAVENOUS | Status: DC
  Administered 2013-03-18 – 2013-03-19 (×2): 100 mg via INTRAVENOUS
  Filled 2013-03-18 (×4): qty 50

## 2013-03-18 MED ORDER — BISACODYL 10 MG RE SUPP
10.0000 mg | Freq: Every day | RECTAL | Status: DC | PRN
  Administered 2013-03-20: 10 mg via RECTAL
  Filled 2013-03-18: qty 1

## 2013-03-18 NOTE — Progress Notes (Signed)
Hopefully will get better on abx alone, agree with above

## 2013-03-18 NOTE — Progress Notes (Signed)
Subjective: Interval History: has complaints dry mouth, stom still sore, no gas.  Objective: Vital signs in last 24 hours: Temp:  [97.4 F (36.3 C)-99.4 F (37.4 C)] 98.9 F (37.2 C) (11/17 0938) Pulse Rate:  [98-106] 98 (11/17 0938) Resp:  [16-20] 17 (11/17 0938) BP: (112-128)/(75-85) 114/79 mmHg (11/17 0938) SpO2:  [94 %-98 %] 95 % (11/17 0938) Weight change:   Intake/Output from previous day: 11/16 0701 - 11/17 0700 In: 1014.6 [I.V.:464.6; IV Piggyback:550] Out: 4900 [Urine:2600; Emesis/NG output:2300] Intake/Output this shift:    General appearance: alert, cooperative, mild distress and moderately obese Resp: diminished breath sounds bilaterally Cardio: S1, S2 normal and systolic murmur: holosystolic 2/6, blowing at apex GI: distended, no bs, tender Extremities: edema 1+  Lab Results:  Recent Labs  03/17/13 0545 03/18/13 0558  WBC 7.2 6.3  HGB 12.7* 12.0*  HCT 37.1* 34.4*  PLT 195 136*   BMET:  Recent Labs  03/17/13 1325 03/18/13 0558  NA 139 142  K 3.2* 3.1*  CL 105 109  CO2 15* 18*  GLUCOSE 137* 144*  BUN 96* 80*  CREATININE 4.04* 2.63*  CALCIUM 6.6* 7.0*   No results found for this basename: PTH,  in the last 72 hours Iron Studies: No results found for this basename: IRON, TIBC, TRANSFERRIN, FERRITIN,  in the last 72 hours  Studies/Results: Ct Abdomen Pelvis Wo Contrast  03/16/2013   CLINICAL DATA:  Lower abdominal pain, distention, nausea and vomiting, constipation and diarrhea  EXAM: CT ABDOMEN AND PELVIS WITHOUT CONTRAST  TECHNIQUE: Multidetector CT imaging of the abdomen and pelvis was performed following the standard protocol without intravenous contrast.  COMPARISON:  Left rib radiographic series-earlier same day; abdominal radiographs - earlier same day  FINDINGS: Nearly all of the ingested enteric contrast remains within the stomach and duodenum. There is moderate-to-marked fluid in gaseous distention of the small bowel all with abrupt  transition point suspected within the central aspect of the left mid abdomen (axial image 63, series 2; coronal images the 114 and 118) with associated decompression of the more distal downstream loops of small bowel, findings worrisome for small bowel obstruction.  Additionally, there is a large amount of stool colon with associated apparent wall thickening primarily involving the descending and sigmoid colon (representative axial images 59, 60, and 73). No pneumoperitoneum, pneumatosis or portal venous gas. Normal noncontrast appearance of the appendix. No definable fluid collection within the abdomen or pelvis.  Normal hepatic contour. Layering gallstones within a distended but otherwise normal-appearing gallbladder. No definite pericholecystic fluid. No ascites.  There is an approximately 0.6 x 0.4 cm nonobstructing stone within the mid aspect of the right kidney (image 42, series 2). No definite left-sided renal stones. No stones are seen along the expected course of either ureter or within the urinary bladder which is underdistended. Normal noncontrast appearance of the bilateral adrenal glands. The pancreas is largely atrophic. Normal noncontrast appearance of the spleen.  Scattered atherosclerotic plaque within normal-caliber abdominal aorta. An IVC filter is tilted within in the infrarenal IVC. Several of the legs of the IVC filter extend beyond the walls of the IVC. There is no associated adjacent stranding or evidence of hemorrhage. Scattered shotty retroperitoneal lymph nodes are not individually not enlarged by size criteria. No definite retroperitoneal, mesenteric, pelvic or inguinal lymphadenopathy on this noncontrast examination.  Limited visualization of the lower thorax demonstrates advanced mixed centrilobular and paraseptal emphysematous change. There are 2 adjacent pneumatic seal/ cysts adjacent to the right heart border with dominant  cysts measuring approximately 4.8 x 3.2 cm. No definite  pneumothorax. There is minimal subsegmental atelectasis within the imaged bilateral lung bases, left greater than right. No definite pleural effusion. Borderline cardiomegaly. Coronary artery calcifications. No pericardial effusion.  No acute osseous abnormalities with special attention paid to the imaged right-sided ribs. 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  IMPRESSION: 1. Findings worrisome for small bowel obstruction with apparent transition point located within the ventral left mid hemi abdomen, the etiology of which is not depicted on this examination and thus presumably secondary to adhesions. 2. Large amount of stool within the colon with associated nonspecific wall thickening of primarily involving the descending and sigmoid colon. Correlation for symptoms of concomitant enteritis is recommended. Further evaluation with colonoscopy after the resolution of acute symptoms may be performed as clinically indicated 3. 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 4. Right-sided nonobstructing nephrolithiasis. 5. Cholelithiasis without evidence of cholecystitis on this noncontrast examination.   Electronically Signed   By: Simonne Come M.D.   On: 03/16/2013 17:09   Dg Ribs Unilateral W/chest Left  03/16/2013   CLINICAL DATA:  Left rib pain.  EXAM: LEFT RIBS AND CHEST - 3+ VIEW  COMPARISON:  None.  FINDINGS: No fracture or other bone lesions are seen involving the ribs. There is no evidence of pneumothorax or pleural effusion. There is no focal consolidation, pleural effusion or pneumothorax. There is bilateral diffuse mild interstitial thickening likely chronic. Heart size and mediastinal contours are within normal limits.  IMPRESSION: No acute osseous injury of the left ribs.   Electronically Signed   By: Elige Ko   On: 03/16/2013 14:03   Dg Hip Complete Left  03/16/2013   CLINICAL DATA:  Recent fall 1 week on the left side from bed. Most painful along the left lateral aspect of the hip.  EXAM: LEFT HIP - COMPLETE 2+ VIEW  COMPARISON:  None.  FINDINGS: There is posttraumatic deformity of the knee. Left ilium with numerous screws and multiple malleable plates transfixing a healed ilium fracture. There are at least 2 screws which traverse the left sacroiliac joint. There is posterior spinal fusion at L5-S1.  There is generalized osteopenia. There are moderate degenerative changes of bilateral hips, left greater than right. There is no acute fracture or dislocation.  There is relative trabecular thickening of free right proximal femur and the pelvis as can be seen with Paget's disease.  IMPRESSION: No acute osseous injury of the left hip.  Prior posttraumatic deformity with internal fixation of the left ilium.   Electronically Signed   By: Elige Ko   On: 03/16/2013 14:00   Dg Abd 2 Views  03/18/2013   CLINICAL DATA:  Abdominal distention  EXAM: ABDOMEN - 2 VIEW  COMPARISON:  03/17/2013  FINDINGS: Old dilated loops of small bowel are again noted. There is air in stool within a normal caliber colon and in the rectum. There are only a few air-fluid levels on the decubitus view. No free air.  Nasogastric tube is stable. Changes from a lumbar spine fusion and the ORIF of a left pelvic fracture are unchanged. Stable vena cava filter.  IMPRESSION: Persistent small bowel obstruction. No significant change from the previous exam. No free air.   Electronically Signed   By: Amie Portland M.D.   On: 03/18/2013 07:50  Dg Abd 2 Views  03/17/2013   CLINICAL DATA:  Followup small bowel obstruction.  EXAM: ABDOMEN - 2 VIEW  COMPARISON:  CT, 03/16/2013  FINDINGS: Distended loops of small bowel with air-fluid levels are noted consistent with the partial small bowel obstruction noted on the previous day's CT. The degree of  obstruction is similar. Some air in stool is seen within nondistended colon. There is no free air.  A nasogastric tube passes below the diaphragm into the stomach.  IMPRESSION: Persistent high-grade partial small bowel obstruction.  No free air.   Electronically Signed   By: Amie Portland M.D.   On: 03/17/2013 10:05   Dg Abd 2 Views  03/16/2013   CLINICAL DATA:  Distended abdomen.  EXAM: ABDOMEN - 2 VIEW  COMPARISON:  None.  FINDINGS: There is gaseous distention of small bowel and colon. There is no pneumatosis, pneumoperitoneum or portal venous gas. There are no pathologic calcifications.  An IVC filter is noted. Again noted is orthopedic hardware transfixing a healed iliac fracture and transfixing the left sacroiliac joint. There is posterior spinal fixation hardware at L5-S1. There is trabecular thickening of the right proximal femur and pelvis as can be seen with Paget's disease.  IMPRESSION: Nonspecific gaseous distention of small bowel and colon which may reflect an ileus versus low bowel obstruction.   Electronically Signed   By: Elige Ko   On: 03/16/2013 14:02    I have reviewed the patient's current medications.  Assessment/Plan: 1 AKI improving with vol and away from NSAIDS. Still acidemic but better with recovery and bicarb 2 SBO ongoing ???cause 3 Hx schizo P lower ivf, cont bicarb.    LOS: 2 days   Norman Davis L 03/18/2013,12:01 PM

## 2013-03-18 NOTE — Progress Notes (Signed)
Subjective: Notes some improvement in pain, nausea, vomiting.  Had out of NG/24hours tube.  Hasn't ambulated much.  Urinating, last BM 2 days ago.  ?flatus.     Objective: Vital signs in last 24 hours: Temp:  [97.4 F (36.3 C)-99.4 F (37.4 C)] 98.4 F (36.9 C) (11/17 0617) Pulse Rate:  [99-106] 99 (11/17 0617) Resp:  [16-20] 16 (11/17 0216) BP: (112-128)/(75-85) 128/85 mmHg (11/17 0617) SpO2:  [94 %-98 %] 97 % (11/17 0617) Last BM Date: 03/16/13  Intake/Output from previous day: 11/16 0701 - 11/17 0700 In: 1014.6 [I.V.:464.6; IV Piggyback:550] Out: 4900 [Urine:2600; Emesis/NG output:2300] Intake/Output this shift:    PE: Gen:  Alert, NAD, pleasant Abd: Soft, mild tenderness diffusely, moderately distended and tympanic, +BS, no HSM, large RUQ abdominal scar noted   Lab Results:   Recent Labs  03/17/13 0545 03/18/13 0558  WBC 7.2 6.3  HGB 12.7* 12.0*  HCT 37.1* 34.4*  PLT 195 136*   BMET  Recent Labs  03/17/13 1325 03/18/13 0558  NA 139 142  K 3.2* 3.1*  CL 105 109  CO2 15* 18*  GLUCOSE 137* 144*  BUN 96* 80*  CREATININE 4.04* 2.63*  CALCIUM 6.6* 7.0*   PT/INR No results found for this basename: LABPROT, INR,  in the last 72 hours CMP     Component Value Date/Time   NA 142 03/18/2013 0558   K 3.1* 03/18/2013 0558   CL 109 03/18/2013 0558   CO2 18* 03/18/2013 0558   GLUCOSE 144* 03/18/2013 0558   BUN 80* 03/18/2013 0558   CREATININE 2.63* 03/18/2013 0558   CALCIUM 7.0* 03/18/2013 0558   PROT 6.3 03/18/2013 0558   ALBUMIN 2.2* 03/18/2013 0558   AST 20 03/18/2013 0558   ALT 14 03/18/2013 0558   ALKPHOS 95 03/18/2013 0558   BILITOT 0.5 03/18/2013 0558   GFRNONAA 22* 03/18/2013 0558   GFRAA 26* 03/18/2013 0558   Lipase  No results found for this basename: lipase       Studies/Results: Ct Abdomen Pelvis Wo Contrast  03/16/2013   CLINICAL DATA:  Lower abdominal pain, distention, nausea and vomiting, constipation and diarrhea   EXAM: CT ABDOMEN AND PELVIS WITHOUT CONTRAST  TECHNIQUE: Multidetector CT imaging of the abdomen and pelvis was performed following the standard protocol without intravenous contrast.  COMPARISON:  Left rib radiographic series-earlier same day; abdominal radiographs - earlier same day  FINDINGS: Nearly all of the ingested enteric contrast remains within the stomach and duodenum. There is moderate-to-marked fluid in gaseous distention of the small bowel all with abrupt transition point suspected within the central aspect of the left mid abdomen (axial image 63, series 2; coronal images the 114 and 118) with associated decompression of the more distal downstream loops of small bowel, findings worrisome for small bowel obstruction.  Additionally, there is a large amount of stool colon with associated apparent wall thickening primarily involving the descending and sigmoid colon (representative axial images 59, 60, and 73). No pneumoperitoneum, pneumatosis or portal venous gas. Normal noncontrast appearance of the appendix. No definable fluid collection within the abdomen or pelvis.  Normal hepatic contour. Layering gallstones within a distended but otherwise normal-appearing gallbladder. No definite pericholecystic fluid. No ascites.  There is an approximately 0.6 x 0.4 cm nonobstructing stone within the mid aspect of the right kidney (image 42, series 2). No definite left-sided renal stones. No stones are seen along the expected course of either ureter or within the urinary bladder which is underdistended. Normal noncontrast  appearance of the bilateral adrenal glands. The pancreas is largely atrophic. Normal noncontrast appearance of the spleen.  Scattered atherosclerotic plaque within normal-caliber abdominal aorta. An IVC filter is tilted within in the infrarenal IVC. Several of the legs of the IVC filter extend beyond the walls of the IVC. There is no associated adjacent stranding or evidence of hemorrhage.  Scattered shotty retroperitoneal lymph nodes are not individually not enlarged by size criteria. No definite retroperitoneal, mesenteric, pelvic or inguinal lymphadenopathy on this noncontrast examination.  Limited visualization of the lower thorax demonstrates advanced mixed centrilobular and paraseptal emphysematous change. There are 2 adjacent pneumatic seal/ cysts adjacent to the right heart border with dominant cysts measuring approximately 4.8 x 3.2 cm. No definite pneumothorax. There is minimal subsegmental atelectasis within the imaged bilateral lung bases, left greater than right. No definite pleural effusion. Borderline cardiomegaly. Coronary artery calcifications. No pericardial effusion.  No acute osseous abnormalities with special attention paid to the imaged right-sided ribs. 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  IMPRESSION: 1. Findings worrisome for small bowel obstruction with apparent transition point located within the ventral left mid hemi abdomen, the etiology of which is not depicted on this examination and thus presumably secondary to adhesions. 2. Large amount of stool within the colon with associated nonspecific wall thickening of primarily involving the descending and sigmoid colon. Correlation for symptoms of concomitant enteritis is recommended. Further evaluation with colonoscopy after the resolution of acute symptoms may be performed as clinically indicated 3. 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 4. Right-sided nonobstructing nephrolithiasis. 5. Cholelithiasis without evidence of cholecystitis on this noncontrast examination.   Electronically Signed   By: Simonne Come M.D.   On: 03/16/2013 17:09   Dg Ribs Unilateral W/chest Left  03/16/2013   CLINICAL DATA:  Left rib pain.  EXAM:  LEFT RIBS AND CHEST - 3+ VIEW  COMPARISON:  None.  FINDINGS: No fracture or other bone lesions are seen involving the ribs. There is no evidence of pneumothorax or pleural effusion. There is no focal consolidation, pleural effusion or pneumothorax. There is bilateral diffuse mild interstitial thickening likely chronic. Heart size and mediastinal contours are within normal limits.  IMPRESSION: No acute osseous injury of the left ribs.   Electronically Signed   By: Elige Ko   On: 03/16/2013 14:03   Dg Hip Complete Left  03/16/2013   CLINICAL DATA:  Recent fall 1 week on the left side from bed. Most painful along the left lateral aspect of the hip.  EXAM: LEFT HIP - COMPLETE 2+ VIEW  COMPARISON:  None.  FINDINGS: There is posttraumatic deformity of the knee. Left ilium with numerous screws and multiple malleable plates transfixing a healed ilium fracture. There are at least 2 screws which traverse the left sacroiliac joint. There is posterior spinal fusion at L5-S1.  There is generalized osteopenia. There are moderate degenerative changes of bilateral hips, left greater than right. There is no acute fracture or dislocation.  There is relative trabecular thickening of free right proximal femur and the pelvis as can be seen with Paget's disease.  IMPRESSION: No acute osseous injury of the left hip.  Prior posttraumatic deformity with internal fixation of the left ilium.   Electronically Signed   By: Elige Ko   On: 03/16/2013 14:00   Dg Abd 2 Views  03/18/2013   CLINICAL DATA:  Abdominal distention  EXAM: ABDOMEN - 2 VIEW  COMPARISON:  03/17/2013  FINDINGS: Old dilated loops of small bowel are again noted. There is air in stool within a normal caliber colon and in the rectum. There are only a few air-fluid levels on the decubitus view. No free air.  Nasogastric tube is stable. Changes from a lumbar spine fusion and the ORIF of a left pelvic fracture are unchanged. Stable vena cava filter.  IMPRESSION:  Persistent small bowel obstruction. No significant change from the previous exam. No free air.   Electronically Signed   By: Amie Portland M.D.   On: 03/18/2013 07:50   Dg Abd 2 Views  03/17/2013   CLINICAL DATA:  Followup small bowel obstruction.  EXAM: ABDOMEN - 2 VIEW  COMPARISON:  CT, 03/16/2013  FINDINGS: Distended loops of small bowel with air-fluid levels are noted consistent with the partial small bowel obstruction noted on the previous day's CT. The degree of obstruction is similar. Some air in stool is seen within nondistended colon. There is no free air.  A nasogastric tube passes below the diaphragm into the stomach.  IMPRESSION: Persistent high-grade partial small bowel obstruction.  No free air.   Electronically Signed   By: Amie Portland M.D.   On: 03/17/2013 10:05   Dg Abd 2 Views  03/16/2013   CLINICAL DATA:  Distended abdomen.  EXAM: ABDOMEN - 2 VIEW  COMPARISON:  None.  FINDINGS: There is gaseous distention of small bowel and colon. There is no pneumatosis, pneumoperitoneum or portal venous gas. There are no pathologic calcifications.  An IVC filter is noted. Again noted is orthopedic hardware transfixing a healed iliac fracture and transfixing the left sacroiliac joint. There is posterior spinal fixation hardware at L5-S1. There is trabecular thickening of the right proximal femur and pelvis as can be seen with Paget's disease.  IMPRESSION: Nonspecific gaseous distention of small bowel and colon which may reflect an ileus versus low bowel obstruction.   Electronically Signed   By: Elige Ko   On: 03/16/2013 14:02    Anti-infectives: Anti-infectives   Start     Dose/Rate Route Frequency Ordered Stop   03/17/13 0845  ciprofloxacin (CIPRO) IVPB 400 mg     400 mg 200 mL/hr over 60 Minutes Intravenous Every 12 hours 03/17/13 0841     03/17/13 0845  metroNIDAZOLE (FLAGYL) IVPB 500 mg     500 mg 100 mL/hr over 60 Minutes Intravenous Every 8 hours 03/17/13 0841     03/16/13 1715   cefTRIAXone (ROCEPHIN) 1 g in dextrose 5 % 50 mL IVPB     1 g 100 mL/hr over 30 Minutes Intravenous  Once 03/16/13 1713 03/16/13 1804       Assessment/Plan pSBO  Abdominal pain Cholelithiasis Slight elevated bilirubin - normal today Thickened descending sigmoid colon, large amount of retained feses ARF ?CRI - Cr down to 2.63 today Schizophrenia  History of traumatic accident requiring abdominal surgery Hypokalemia & Hypomagnesemia - per primary service  Plan: 1.  Conservative mgt - IVF, pain control, antiemetics, on antibiotics (cipro/flagyl) 2.  Ambulate and IS 3.  SCD's and heparin 4.  Hopefully will resolve without surgery 5.  Try dulcolax    LOS: 2 days    Aris Georgia 03/18/2013, 8:52 AM Pager: [phone]

## 2013-03-18 NOTE — Progress Notes (Signed)
[facility] PROGRESS NOTE  Norman Davis ZOX:[id number] DOB: [birth year] DOA: 03/16/2013 PCP: No PCP Per Patient  Assessment/Plan: 1-Acute Renal Failure: Unclear if on chronic. Suspect ATN secondary to hypotension, decrease volume, NSAID use. Also component of post obstructive uropathy in setting of SBO. -Renal function improving with IV fluids, bicarb.  -Cr peak to 4.9 on admission. Cr has decrease to 2.6 today.  -Appreciate renal help.  -Urine out put 2.6 L.  -replete k with IV 3 runs.   2-SBO: Continue with NGT, IV fluids, IV protonix. CT show nonspecific wall thickening of primarily involving the descending and sigmoid colon. Continue with V cipro and IV flagyl day 2.  Patient will need Colonoscopy at some point.   3-UTI ?: UA with positive nitrates. Will cover with Ciprofloxacin.  urine culture pending.   4- Metabolic acidosis: increase anion gap in setting renal failure. Metabolic acidosis could be related to intestinal loss also. IV fluids with bicarb. Lactic acid 1.3.  5-Urinary retention: in setting of SBO. Continue with foley. Need voiding trial when SBO resolved.    6-Schizophrenia in remission: will probably have to hold medications in setting of SBO.  7-Hypertension: hold Norvasc.  8-DVT prophylaxis: Heparin.  9-Hyperbilirubinemia: resolved. could be hepatic congestion.    Code Status: Full Code.  Family Communication: care discussed with patient.  Disposition Plan: Remain inpatient.    Consultants:  [facility]  Renal  Procedures:  none  Antibiotics:  Ciprofloxacin 11-16  Flagyl 11-16  HPI/Subjective: Feels better today. Abdomen less distended. Pain some what improved.   Objective: Filed Vitals:   03/18/13 0938  BP: 114/79  Pulse: 98  Temp: 98.9 F (37.2 C)  Resp: 17    Intake/Output Summary (Last 24 hours) at 03/18/13 1337 Last data filed at 03/18/13 0500  Gross per 24 hour  Intake 1014.58 ml  Output   3250 ml  Net -2235.42 ml   Filed  Weights   03/16/13 2052  Weight: 96.163 kg (212 lb)    Exam:   General:  Alert, awake, appears acutely  ill, no distress. NG tube in place.   Cardiovascular: S 1, S 2 RRR  Respiratory: bilateral ronchus, crackles.   Abdomen: Bs present, soft, less distended. Mild tender.   Musculoskeletal: trace edema.   Data Reviewed: Basic Metabolic Panel:  Recent Labs Lab 03/16/13 1219 03/17/13 0545 03/17/13 1020 03/17/13 1325 03/18/13 0558  NA 135 135  --  139 142  K 4.4 4.5  --  3.2* 3.1*  CL 99 103  --  105 109  CO2 15* 10*  --  15* 18*  GLUCOSE 136* 133*  --  137* 144*  BUN 88* 99*  --  96* 80*  CREATININE 4.93* 4.50*  --  4.04* 2.63*  CALCIUM 7.5* 6.7*  --  6.6* 7.0*  MG  --   --   --   --  2.8*  PHOS  --   --  4.9*  --  2.8   Liver Function Tests:  Recent Labs Lab 03/16/13 1219 03/17/13 0545 03/18/13 0558  AST 28 31 20   ALT 12 14 14   ALKPHOS 118* 115 95  BILITOT 3.3* 2.5* 0.5  PROT 7.6 7.2 6.3  ALBUMIN 3.1* 2.6* 2.2*   No results found for this basename: LIPASE, AMYLASE,  in the last 168 hours No results found for this basename: AMMONIA,  in the last 168 hours CBC:  Recent Labs Lab 03/16/13 1219 03/17/13 0545 03/18/13 0558  WBC 6.4 7.2 6.3  NEUTROABS  4.5  --   --   HGB 14.1 12.7* 12.0*  HCT 39.2 37.1* 34.4*  MCV 83.1 84.3 82.3  PLT 148* 195 136*   Cardiac Enzymes: No results found for this basename: CKTOTAL, CKMB, CKMBINDEX, TROPONINI,  in the last 168 hours BNP (last 3 results) No results found for this basename: PROBNP,  in the last 8760 hours CBG: No results found for this basename: GLUCAP,  in the last 168 hours  No results found for this or any previous visit (from the past 240 hour(s)).   Studies: Ct Abdomen Pelvis Wo Contrast  03/16/2013   CLINICAL DATA:  Lower abdominal pain, distention, nausea and vomiting, constipation and diarrhea  EXAM: CT ABDOMEN AND PELVIS WITHOUT CONTRAST  TECHNIQUE: Multidetector CT imaging of the abdomen and  pelvis was performed following the standard protocol without intravenous contrast.  COMPARISON:  Left rib radiographic series-earlier same day; abdominal radiographs - earlier same day  FINDINGS: Nearly all of the ingested enteric contrast remains within the stomach and duodenum. There is moderate-to-marked fluid in gaseous distention of the small bowel all with abrupt transition point suspected within the central aspect of the left mid abdomen (axial image 63, series 2; coronal images the 114 and 118) with associated decompression of the more distal downstream loops of small bowel, findings worrisome for small bowel obstruction.  Additionally, there is a large amount of stool colon with associated apparent wall thickening primarily involving the descending and sigmoid colon (representative axial images 59, 60, and 73). No pneumoperitoneum, pneumatosis or portal venous gas. Normal noncontrast appearance of the appendix. No definable fluid collection within the abdomen or pelvis.  Normal hepatic contour. Layering gallstones within a distended but otherwise normal-appearing gallbladder. No definite pericholecystic fluid. No ascites.  There is an approximately 0.6 x 0.4 cm nonobstructing stone within the mid aspect of the right kidney (image 42, series 2). No definite left-sided renal stones. No stones are seen along the expected course of either ureter or within the urinary bladder which is underdistended. Normal noncontrast appearance of the bilateral adrenal glands. The pancreas is largely atrophic. Normal noncontrast appearance of the spleen.  Scattered atherosclerotic plaque within normal-caliber abdominal aorta. An IVC filter is tilted within in the infrarenal IVC. Several of the legs of the IVC filter extend beyond the walls of the IVC. There is no associated adjacent stranding or evidence of hemorrhage. Scattered shotty retroperitoneal lymph nodes are not individually not enlarged by size criteria. No definite  retroperitoneal, mesenteric, pelvic or inguinal lymphadenopathy on this noncontrast examination.  Limited visualization of the lower thorax demonstrates advanced mixed centrilobular and paraseptal emphysematous change. There are 2 adjacent pneumatic seal/ cysts adjacent to the right heart border with dominant cysts measuring approximately 4.8 x 3.2 cm. No definite pneumothorax. There is minimal subsegmental atelectasis within the imaged bilateral lung bases, left greater than right. No definite pleural effusion. Borderline cardiomegaly. Coronary artery calcifications. No pericardial effusion.  No acute osseous abnormalities with special attention paid to the imaged right-sided ribs. 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  IMPRESSION: 1. Findings worrisome for small bowel obstruction with apparent transition point located within the ventral left mid hemi abdomen, the etiology of which is not depicted on this examination and thus presumably secondary to adhesions. 2. Large amount of stool within the colon with associated nonspecific wall thickening of primarily involving the descending  and sigmoid colon. Correlation for symptoms of concomitant enteritis is recommended. Further evaluation with colonoscopy after the resolution of acute symptoms may be performed as clinically indicated 3. 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 4. Right-sided nonobstructing nephrolithiasis. 5. Cholelithiasis without evidence of cholecystitis on this noncontrast examination.   Electronically Signed   By: Simonne Come M.D.   On: 03/16/2013 17:09   Dg Ribs Unilateral W/chest Left  03/16/2013   CLINICAL DATA:  Left rib pain.  EXAM: LEFT RIBS AND CHEST - 3+ VIEW  COMPARISON:  None.  FINDINGS: No fracture or other bone lesions are seen  involving the ribs. There is no evidence of pneumothorax or pleural effusion. There is no focal consolidation, pleural effusion or pneumothorax. There is bilateral diffuse mild interstitial thickening likely chronic. Heart size and mediastinal contours are within normal limits.  IMPRESSION: No acute osseous injury of the left ribs.   Electronically Signed   By: Elige Ko   On: 03/16/2013 14:03   Dg Hip Complete Left  03/16/2013   CLINICAL DATA:  Recent fall 1 week on the left side from bed. Most painful along the left lateral aspect of the hip.  EXAM: LEFT HIP - COMPLETE 2+ VIEW  COMPARISON:  None.  FINDINGS: There is posttraumatic deformity of the knee. Left ilium with numerous screws and multiple malleable plates transfixing a healed ilium fracture. There are at least 2 screws which traverse the left sacroiliac joint. There is posterior spinal fusion at L5-S1.  There is generalized osteopenia. There are moderate degenerative changes of bilateral hips, left greater than right. There is no acute fracture or dislocation.  There is relative trabecular thickening of free right proximal femur and the pelvis as can be seen with Paget's disease.  IMPRESSION: No acute osseous injury of the left hip.  Prior posttraumatic deformity with internal fixation of the left ilium.   Electronically Signed   By: Elige Ko   On: 03/16/2013 14:00   Dg Abd 2 Views  03/18/2013   CLINICAL DATA:  Abdominal distention  EXAM: ABDOMEN - 2 VIEW  COMPARISON:  03/17/2013  FINDINGS: Old dilated loops of small bowel are again noted. There is air in stool within a normal caliber colon and in the rectum. There are only a few air-fluid levels on the decubitus view. No free air.  Nasogastric tube is stable. Changes from a lumbar spine fusion and the ORIF of a left pelvic fracture are unchanged. Stable vena cava filter.  IMPRESSION: Persistent small bowel obstruction. No significant change from the previous exam. No free air.    Electronically Signed   By: Amie Portland M.D.   On: 03/18/2013 07:50   Dg Abd 2 Views  03/17/2013   CLINICAL DATA:  Followup small bowel obstruction.  EXAM: ABDOMEN - 2 VIEW  COMPARISON:  CT, 03/16/2013  FINDINGS: Distended loops of small bowel with air-fluid levels are noted consistent with the partial small bowel obstruction noted on the previous day's CT. The degree of obstruction is similar. Some air in stool is seen within nondistended colon. There is no free air.  A nasogastric tube passes below the diaphragm into the stomach.  IMPRESSION: Persistent high-grade partial small bowel obstruction.  No free air.   Electronically Signed   By: Amie Portland M.D.   On: 03/17/2013 10:05   Dg Abd 2 Views  03/16/2013   CLINICAL DATA:  Distended abdomen.  EXAM: ABDOMEN - 2 VIEW  COMPARISON:  None.  FINDINGS: There is gaseous distention of small bowel and colon. There is no pneumatosis, pneumoperitoneum or portal venous gas. There are no pathologic calcifications.  An IVC filter is noted. Again noted is orthopedic hardware transfixing a healed iliac fracture and transfixing the left sacroiliac joint. There is posterior spinal fixation hardware at L5-S1. There is trabecular thickening of the right proximal femur and pelvis as can be seen with Paget's disease.  IMPRESSION: Nonspecific gaseous distention of small bowel and colon which may reflect an ileus versus low bowel obstruction.   Electronically Signed   By: Elige Ko   On: 03/16/2013 14:02    Scheduled Meds: . ciprofloxacin  400 mg Intravenous Q12H  . fluconazole (DIFLUCAN) IV  100 mg Intravenous Q24H  . heparin  5,000 Units Subcutaneous Q8H  . metronidazole  500 mg Intravenous Q8H  . mirtazapine  30 mg Oral QHS  . pantoprazole (PROTONIX) IV  40 mg Intravenous Q12H  . risperiDONE  3 mg Oral QHS   Continuous Infusions: .  sodium bicarbonate  infusion 1000 mL 125 mL/hr at 03/18/13 1130    Principal Problem:   AKI (acute kidney injury) Active  Problems:   SBO (small bowel obstruction)   High anion gap metabolic acidosis   Schizophrenia in remission    Time spent: 30 minutes.     Fredericka Bottcher  [facility] Pager [phone]. If 7PM-7AM, please contact night-coverage at [email or web address], password [facility] 03/18/2013, 1:37 PM  LOS: 2 days

## 2013-03-19 ENCOUNTER — Inpatient Hospital Stay (HOSPITAL_COMMUNITY): Payer: Medicare (Managed Care)

## 2013-03-19 DIAGNOSIS — I959 Hypotension, unspecified: Secondary | ICD-10-CM

## 2013-03-19 DIAGNOSIS — K56609 Unspecified intestinal obstruction, unspecified as to partial versus complete obstruction: Principal | ICD-10-CM

## 2013-03-19 DIAGNOSIS — N289 Disorder of kidney and ureter, unspecified: Secondary | ICD-10-CM

## 2013-03-19 DIAGNOSIS — N179 Acute kidney failure, unspecified: Secondary | ICD-10-CM

## 2013-03-19 DIAGNOSIS — E872 Acidosis: Secondary | ICD-10-CM

## 2013-03-19 LAB — IRON AND TIBC
Iron: 79 ug/dL (ref 42–135)
Saturation Ratios: 55 % (ref 20–55)
UIBC: 64 ug/dL — ABNORMAL LOW (ref 125–400)

## 2013-03-19 LAB — CBC
HCT: 35.1 % — ABNORMAL LOW (ref 39.0–52.0)
Hemoglobin: 12.4 g/dL — ABNORMAL LOW (ref 13.0–17.0)
MCHC: 35.3 g/dL (ref 30.0–36.0)
MCV: 82.4 fL (ref 78.0–100.0)
RBC: 4.26 MIL/uL (ref 4.22–5.81)
RDW: 15.6 % — ABNORMAL HIGH (ref 11.5–15.5)
WBC: 7.4 10*3/uL (ref 4.0–10.5)

## 2013-03-19 LAB — COMPREHENSIVE METABOLIC PANEL
ALT: 14 U/L (ref 0–53)
Alkaline Phosphatase: 93 U/L (ref 39–117)
BUN: 47 mg/dL — ABNORMAL HIGH (ref 6–23)
CO2: 27 mEq/L (ref 19–32)
GFR calc Af Amer: 45 mL/min — ABNORMAL LOW (ref >90)
GFR calc non Af Amer: 38 mL/min — ABNORMAL LOW (ref >90)
Glucose, Bld: 123 mg/dL — ABNORMAL HIGH (ref 70–99)
Potassium: 3.2 mEq/L — ABNORMAL LOW (ref 3.5–5.1)
Total Bilirubin: 0.5 mg/dL (ref 0.3–1.2)
Total Protein: 6.1 g/dL (ref 6.0–8.3)

## 2013-03-19 MED ORDER — KCL IN DEXTROSE-NACL 20-5-0.45 MEQ/L-%-% IV SOLN
INTRAVENOUS | Status: DC
  Administered 2013-03-19 – 2013-03-21 (×3): via INTRAVENOUS
  Filled 2013-03-19 (×7): qty 1000

## 2013-03-19 MED ORDER — POTASSIUM CHLORIDE 10 MEQ/100ML IV SOLN
10.0000 meq | INTRAVENOUS | Status: AC
  Administered 2013-03-19 (×2): 10 meq via INTRAVENOUS
  Filled 2013-03-19 (×3): qty 100

## 2013-03-19 MED ORDER — DEXTROSE 5 % IV SOLN
INTRAVENOUS | Status: DC
  Administered 2013-03-19: 12:00:00 via INTRAVENOUS

## 2013-03-19 NOTE — Progress Notes (Signed)
Subjective: Interval History: has complaints concern of operation.  Objective: Vital signs in last 24 hours: Temp:  [98.6 F (37 C)-98.8 F (37.1 C)] 98.8 F (37.1 C) 04-16-2023 0518) Pulse Rate:  [91-96] 91 2023-04-16 0518) Resp:  [15-18] 18 2023-04-16 0518) BP: (115-125)/(70-79) 125/72 mmHg 2023/04/16 0518) SpO2:  [95 %-98 %] 95 % 16-Apr-2023 0518) Weight change:   Intake/Output from previous day: 11/17 0701 - 04-16-23 0700 In: 4710.4 [I.V.:3960.4; IV Piggyback:750] Out: 3000 [Urine:2100; Emesis/NG output:900] Intake/Output this shift:    General appearance: cooperative, mildly obese and slowed mentation Resp: diminished breath sounds bilaterally Cardio: S1, S2 normal and systolic murmur: holosystolic 2/6, blowing at apex GI: distended, no bs tender Extremities: extremities normal, atraumatic, no cyanosis or edema  Lab Results:  Recent Labs  03/18/13 0558 April 15, 2013 0512  WBC 6.3 7.4  HGB 12.0* 12.4*  HCT 34.4* 35.1*  PLT 136* 138*   BMET:  Recent Labs  03/18/13 0558 04-15-2013 0512  NA 142 147*  K 3.1* 3.2*  CL 109 111  CO2 18* 27  GLUCOSE 144* 123*  BUN 80* 47*  CREATININE 2.63* 1.68*  CALCIUM 7.0* 8.2*   No results found for this basename: PTH,  in the last 72 hours Iron Studies:  Recent Labs  04/15/2013 0512  IRON 79  TIBC 143*    Studies/Results: Dg Abd 2 Views  2013/04/15   CLINICAL DATA:  Abdominal pain.  History of small bowel obstruction.  EXAM: ABDOMEN - 2 VIEW  COMPARISON:  03/18/2013.  FINDINGS: There is a nasogastric tube with the tip in the stomach. There is gaseous distention of small bowel and colon. The left lateral aspect the abdomen is excluded from the field of view. There is interval decrease in the amount a gas is distention of small bowel.  Again noted is orthopedic hardware transfixing a old healed comminuted left iliac fracture. There is posterior spinal fusion at L5-S1. There is an IVC filter noted.  There are degenerative changes of bilateral hips.   IMPRESSION: Nasogastric tube in the stomach. Interval improvement in the degree of small bowel distension.   Electronically Signed   By: Elige Ko   On: 04/15/2013 11:18   Dg Abd 2 Views  03/18/2013   CLINICAL DATA:  Abdominal distention  EXAM: ABDOMEN - 2 VIEW  COMPARISON:  03/17/2013  FINDINGS: Old dilated loops of small bowel are again noted. There is air in stool within a normal caliber colon and in the rectum. There are only a few air-fluid levels on the decubitus view. No free air.  Nasogastric tube is stable. Changes from a lumbar spine fusion and the ORIF of a left pelvic fracture are unchanged. Stable vena cava filter.  IMPRESSION: Persistent small bowel obstruction. No significant change from the previous exam. No free air.   Electronically Signed   By: Amie Portland M.D.   On: 03/18/2013 07:50    I have reviewed the patient's current medications.  Assessment/Plan: 1 AKI resolving . Related to vol depletion and NSAID  . Can replace what is losing now.  Bicarb better with recovery and replacement. Needs k for losses 2 SBO primary issue  P will s/o, change to D5 1/2 NS with K   LOS: 3 days   Norman Davis April 15, 2013,1:31 PM

## 2013-03-19 NOTE — Progress Notes (Signed)
Subjective: States no flatus or bm, unchanged   Objective: Vital signs in last 24 hours: Temp:  [98.6 F (37 C)-98.9 F (37.2 C)] 98.8 F (37.1 C) (11/18 0518) Pulse Rate:  [91-98] 91 (11/18 0518) Resp:  [15-18] 18 (11/18 0518) BP: (114-125)/(70-79) 125/72 mmHg (11/18 0518) SpO2:  [95 %-98 %] 95 % (11/18 0518) Last BM Date: 03/16/13  Intake/Output from previous day: 16-Apr-2023 0701 - 11/18 0700 In: 4710.4 [I.V.:3960.4; IV Piggyback:750] Out: 3000 [Urine:2100; Emesis/NG output:900] Intake/Output this shift:    General appearance: no distress GI: distended, mild tenderness llq, no bs present  Lab Results:   Recent Labs  04-15-13 0558 03/19/13 0512  WBC 6.3 7.4  HGB 12.0* 12.4*  HCT 34.4* 35.1*  PLT 136* 138*   BMET  Recent Labs  April 15, 2013 0558 03/19/13 0512  NA 142 147*  K 3.1* 3.2*  CL 109 111  CO2 18* 27  GLUCOSE 144* 123*  BUN 80* 47*  CREATININE 2.63* 1.68*  CALCIUM 7.0* 8.2*   PT/INR No results found for this basename: LABPROT, INR,  in the last 72 hours ABG No results found for this basename: PHART, PCO2, PO2, HCO3,  in the last 72 hours  Studies/Results: Dg Abd 2 Views  Apr 15, 2013   CLINICAL DATA:  Abdominal distention  EXAM: ABDOMEN - 2 VIEW  COMPARISON:  03/17/2013  FINDINGS: Old dilated loops of small bowel are again noted. There is air in stool within a normal caliber colon and in the rectum. There are only a few air-fluid levels on the decubitus view. No free air.  Nasogastric tube is stable. Changes from a lumbar spine fusion and the ORIF of a left pelvic fracture are unchanged. Stable vena cava filter.  IMPRESSION: Persistent small bowel obstruction. No significant change from the previous exam. No free air.   Electronically Signed   By: Amie Portland M.D.   On: Apr 15, 2013 07:50   Dg Abd 2 Views  03/17/2013   CLINICAL DATA:  Followup small bowel obstruction.  EXAM: ABDOMEN - 2 VIEW  COMPARISON:  CT, 03/16/2013  FINDINGS: Distended loops of  small bowel with air-fluid levels are noted consistent with the partial small bowel obstruction noted on the previous day's CT. The degree of obstruction is similar. Some air in stool is seen within nondistended colon. There is no free air.  A nasogastric tube passes below the diaphragm into the stomach.  IMPRESSION: Persistent high-grade partial small bowel obstruction.  No free air.   Electronically Signed   By: Amie Portland M.D.   On: 03/17/2013 10:05    Anti-infectives: Anti-infectives   Start     Dose/Rate Route Frequency Ordered Stop   04/15/13 1400  fluconazole (DIFLUCAN) IVPB 100 mg     100 mg 50 mL/hr over 60 Minutes Intravenous Every 24 hours April 15, 2013 1153     03/17/13 0845  ciprofloxacin (CIPRO) IVPB 400 mg     400 mg 200 mL/hr over 60 Minutes Intravenous Every 12 hours 03/17/13 0841     03/17/13 0845  metroNIDAZOLE (FLAGYL) IVPB 500 mg     500 mg 100 mL/hr over 60 Minutes Intravenous Every 8 hours 03/17/13 0841     03/16/13 1715  cefTRIAXone (ROCEPHIN) 1 g in dextrose 5 % 50 mL IVPB     1 g 100 mL/hr over 30 Minutes Intravenous  Once 03/16/13 1713 03/16/13 1804      Assessment/Plan: sbo  I think he will likely need surgery. He has some films pending this am.  If these are unchanged I will discuss elap with loa with him which is what I think he will likely need.  I am unsure of indication for flagyl that he had started previously.  [facility] 03/19/2013

## 2013-03-19 NOTE — Progress Notes (Signed)
[facility] PROGRESS NOTE  Norman Davis WUJ:[id number] DOB: 1940/12/04 DOA: 03/16/2013 PCP: No PCP Per Patient  Assessment/Plan: 1-Acute Renal Failure: Unclear if on chronic. Suspect ATN secondary to hypotension, decrease volume, NSAID use. Also component of post obstructive uropathy in setting of SBO. -Renal function improving with IV fluids. Will discontinue Bicarb Gtt. Change IV fluids to D 5 due to increase sodium level.  -Cr peak to 4.9 on admission. Cr has decrease to 1.6 today.  -Appreciate renal help.  -Urine out put 2.1 L.  -replete k with IV 3 runs.   2-SBO: Continue with NGT, IV fluids, IV protonix. CT show nonspecific wall thickening of primarily involving the descending and sigmoid colon. Continue with V cipro and IV flagyl day 3. Patient will need Colonoscopy at some point. Appreciate surgery evaluation. KUB with decrease small bowel distension.   3-UTI ?: UA with positive nitrates. Will cover with Ciprofloxacin.  urine culture no growth.   4- Metabolic acidosis: increase anion gap in setting renal failure. Metabolic acidosis could be related to intestinal loss also. Resolved with IV bicarb. I will discontinue Bicarb gtt today, CO 2 at 27.  5-Urinary retention: in setting of SBO. Continue with foley. Need voiding trial when SBO resolved.    6-Schizophrenia in remission: will probably have to hold medications in setting of SBO.  7-Hypertension: hold Norvasc.  8-DVT prophylaxis: Heparin.  9-Hyperbilirubinemia: resolved. could be hepatic congestion.    Code Status: Full Code.  Family Communication: care discussed with patient.  Disposition Plan: Remain inpatient.    Consultants:  [facility]  Renal  Procedures:  none  Antibiotics:  Ciprofloxacin 11-16  Flagyl 11-16  HPI/Subjective: Feels better today. Abdomen less distended. Pain some what improved.   Objective: Filed Vitals:   03/19/13 0518  BP: 125/72  Pulse: 91  Temp: 98.8 F (37.1 C)  Resp: 18     Intake/Output Summary (Last 24 hours) at 03/19/13 0917 Last data filed at 03/19/13 0600  Gross per 24 hour  Intake 4710.42 ml  Output   3000 ml  Net 1710.42 ml   Filed Weights   03/16/13 2052  Weight: 96.163 kg (212 lb)    Exam:   General:  Alert, awake, appears acutely  ill, no distress. NG tube in place.   Cardiovascular: S 1, S 2 RRR  Respiratory: bilateral air movement,  Abdomen: Bs present, soft, less distended. Mild tender.   Musculoskeletal: trace edema.   Data Reviewed: Basic Metabolic Panel:  Recent Labs Lab 03/16/13 1219 03/17/13 0545 03/17/13 1020 03/17/13 1325 03/18/13 0558 03/19/13 0512  NA 135 135  --  139 142 147*  K 4.4 4.5  --  3.2* 3.1* 3.2*  CL 99 103  --  105 109 111  CO2 15* 10*  --  15* 18* 27  GLUCOSE 136* 133*  --  137* 144* 123*  BUN 88* 99*  --  96* 80* 47*  CREATININE 4.93* 4.50*  --  4.04* 2.63* 1.68*  CALCIUM 7.5* 6.7*  --  6.6* 7.0* 8.2*  MG  --   --   --   --  2.8*  --   PHOS  --   --  4.9*  --  2.8 2.6   Liver Function Tests:  Recent Labs Lab 03/16/13 1219 03/17/13 0545 03/18/13 0558 03/19/13 0512  AST 28 31 20 18   ALT 12 14 14 14   ALKPHOS 118* 115 95 93  BILITOT 3.3* 2.5* 0.5 0.5  PROT 7.6 7.2 6.3 6.1  ALBUMIN  3.1* 2.6* 2.2* 2.2*   No results found for this basename: LIPASE, AMYLASE,  in the last 168 hours No results found for this basename: AMMONIA,  in the last 168 hours CBC:  Recent Labs Lab 03/16/13 1219 03/17/13 0545 03/18/13 0558 03/19/13 0512  WBC 6.4 7.2 6.3 7.4  NEUTROABS 4.5  --   --   --   HGB 14.1 12.7* 12.0* 12.4*  HCT 39.2 37.1* 34.4* 35.1*  MCV 83.1 84.3 82.3 82.4  PLT 148* 195 136* 138*   Cardiac Enzymes: No results found for this basename: CKTOTAL, CKMB, CKMBINDEX, TROPONINI,  in the last 168 hours BNP (last 3 results) No results found for this basename: PROBNP,  in the last 8760 hours CBG: No results found for this basename: GLUCAP,  in the last 168 hours  Recent Results  (from the past 240 hour(s))  URINE CULTURE     Status: None   Collection Time    03/17/13  9:13 AM      Result Value Range Status   Specimen Description URINE, CATHETERIZED   Final   Special Requests NONE   Final   Culture  Setup Time     Final   Value: 03/17/2013 18:26     Performed at [facility]   Culture     Final   Value: NO GROWTH     Performed at [facility]   Report Status 03/18/2013 FINAL   Final     Studies: Dg Abd 2 Views  03/18/2013   CLINICAL DATA:  Abdominal distention  EXAM: ABDOMEN - 2 VIEW  COMPARISON:  03/17/2013  FINDINGS: Old dilated loops of small bowel are again noted. There is air in stool within a normal caliber colon and in the rectum. There are only a few air-fluid levels on the decubitus view. No free air.  Nasogastric tube is stable. Changes from a lumbar spine fusion and the ORIF of a left pelvic fracture are unchanged. Stable vena cava filter.  IMPRESSION: Persistent small bowel obstruction. No significant change from the previous exam. No free air.   Electronically Signed   By: Amie Portland M.D.   On: 03/18/2013 07:50   Dg Abd 2 Views  03/17/2013   CLINICAL DATA:  Followup small bowel obstruction.  EXAM: ABDOMEN - 2 VIEW  COMPARISON:  CT, 03/16/2013  FINDINGS: Distended loops of small bowel with air-fluid levels are noted consistent with the partial small bowel obstruction noted on the previous day's CT. The degree of obstruction is similar. Some air in stool is seen within nondistended colon. There is no free air.  A nasogastric tube passes below the diaphragm into the stomach.  IMPRESSION: Persistent high-grade partial small bowel obstruction.  No free air.   Electronically Signed   By: Amie Portland M.D.   On: 03/17/2013 10:05    Scheduled Meds: . ciprofloxacin  400 mg Intravenous Q12H  . fluconazole (DIFLUCAN) IV  100 mg Intravenous Q24H  . heparin  5,000 Units Subcutaneous Q8H  . metronidazole  500 mg Intravenous Q8H  . mirtazapine   30 mg Oral QHS  . pantoprazole (PROTONIX) IV  40 mg Intravenous Q12H  . potassium chloride  10 mEq Intravenous Q1 Hr x 3  . risperiDONE  3 mg Oral QHS   Continuous Infusions: . dextrose      Principal Problem:   AKI (acute kidney injury) Active Problems:   SBO (small bowel obstruction)   High anion gap metabolic acidosis   Schizophrenia  in remission    Time spent: 30 minutes.     REGALADO,BELKYS  [facility] Pager [phone]. If 7PM-7AM, please contact night-coverage at [email or web address], password [facility] 03/19/2013, 9:17 AM  LOS: 3 days

## 2013-03-20 ENCOUNTER — Inpatient Hospital Stay (HOSPITAL_COMMUNITY): Payer: Medicare (Managed Care)

## 2013-03-20 DIAGNOSIS — I10 Essential (primary) hypertension: Secondary | ICD-10-CM

## 2013-03-20 LAB — CBC
HCT: 35.5 % — ABNORMAL LOW (ref 39.0–52.0)
Hemoglobin: 11.8 g/dL — ABNORMAL LOW (ref 13.0–17.0)
MCH: 28.2 pg (ref 26.0–34.0)
MCHC: 33.2 g/dL (ref 30.0–36.0)
MCV: 84.7 fL (ref 78.0–100.0)
RDW: 16.2 % — ABNORMAL HIGH (ref 11.5–15.5)

## 2013-03-20 LAB — BASIC METABOLIC PANEL
BUN: 28 mg/dL — ABNORMAL HIGH (ref 6–23)
CO2: 24 mEq/L (ref 19–32)
GFR calc non Af Amer: 56 mL/min — ABNORMAL LOW (ref >90)
Glucose, Bld: 154 mg/dL — ABNORMAL HIGH (ref 70–99)
Potassium: 3.3 mEq/L — ABNORMAL LOW (ref 3.5–5.1)

## 2013-03-20 MED ORDER — IOHEXOL 300 MG/ML  SOLN
200.0000 mL | Freq: Once | INTRAMUSCULAR | Status: AC | PRN
  Administered 2013-03-20: 200 mL

## 2013-03-20 MED ORDER — BIOTENE DRY MOUTH MT LIQD
15.0000 mL | Freq: Two times a day (BID) | OROMUCOSAL | Status: DC
  Administered 2013-03-20 – 2013-03-22 (×6): 15 mL via OROMUCOSAL

## 2013-03-20 MED ORDER — CHLORHEXIDINE GLUCONATE 0.12 % MT SOLN
15.0000 mL | Freq: Two times a day (BID) | OROMUCOSAL | Status: DC
  Administered 2013-03-20 – 2013-03-22 (×6): 15 mL via OROMUCOSAL
  Filled 2013-03-20 (×5): qty 15
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
## 2013-03-20 NOTE — Progress Notes (Signed)
I think he will need surgery today.  Does not want to call family.  Will await film and then discuss.

## 2013-03-21 DIAGNOSIS — K5900 Constipation, unspecified: Secondary | ICD-10-CM

## 2013-03-21 LAB — BASIC METABOLIC PANEL
BUN: 16 mg/dL (ref 6–23)
Calcium: 8.5 mg/dL (ref 8.4–10.5)
Creatinine, Ser: 1.08 mg/dL (ref 0.50–1.35)
GFR calc non Af Amer: 66 mL/min — ABNORMAL LOW (ref >90)
Glucose, Bld: 162 mg/dL — ABNORMAL HIGH (ref 70–99)
Potassium: 3.5 mEq/L (ref 3.5–5.1)
Sodium: 147 mEq/L — ABNORMAL HIGH (ref 135–145)

## 2013-03-21 MED ORDER — HYDROCODONE-ACETAMINOPHEN 5-325 MG PO TABS
1.0000 | ORAL_TABLET | ORAL | Status: DC | PRN
  Administered 2013-03-25 – 2013-03-26 (×2): 1 via ORAL
  Filled 2013-03-21 (×2): qty 1

## 2013-03-21 MED ORDER — TAMSULOSIN HCL 0.4 MG PO CAPS
0.4000 mg | ORAL_CAPSULE | Freq: Every day | ORAL | Status: DC
  Administered 2013-03-21 – 2013-03-22 (×2): 0.4 mg via ORAL
  Filled 2013-03-21 (×3): qty 1

## 2013-03-21 MED ORDER — DEXTROSE 5 % IV SOLN
INTRAVENOUS | Status: DC
  Administered 2013-03-21: 09:00:00 via INTRAVENOUS

## 2013-03-21 MED ORDER — POTASSIUM CL IN DEXTROSE 5% 20 MEQ/L IV SOLN
20.0000 meq | INTRAVENOUS | Status: DC
  Administered 2013-03-21 – 2013-03-22 (×2): 20 meq via INTRAVENOUS
  Filled 2013-03-21 (×5): qty 1000

## 2013-03-21 NOTE — Progress Notes (Signed)
His sbft still has dilated bowel but contrast does flow through and clinically he really looks well this am.  He is passing flatus and having numerous bms.  He is hungry. Has no abdominal pain, abdomen is soft. I think he is at risk of failing still but given clinical status I think that dc ng and give him trial would be reasonable.
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
## 2013-03-22 ENCOUNTER — Inpatient Hospital Stay (HOSPITAL_COMMUNITY): Payer: Medicare (Managed Care)

## 2013-03-22 LAB — BASIC METABOLIC PANEL
BUN: 11 mg/dL (ref 6–23)
Calcium: 8.2 mg/dL — ABNORMAL LOW (ref 8.4–10.5)
Chloride: 110 mEq/L (ref 96–112)
Creatinine, Ser: 0.85 mg/dL (ref 0.50–1.35)
GFR calc Af Amer: >90 mL/min (ref >90)
GFR calc non Af Amer: 84 mL/min — ABNORMAL LOW (ref >90)
Potassium: 3.3 mEq/L — ABNORMAL LOW (ref 3.5–5.1)

## 2013-03-22 MED ORDER — AMLODIPINE BESYLATE 2.5 MG PO TABS
2.5000 mg | ORAL_TABLET | Freq: Every day | ORAL | Status: DC
  Administered 2013-03-22 – 2013-03-27 (×6): 2.5 mg via ORAL
  Filled 2013-03-22 (×6): qty 1

## 2013-03-22 MED ORDER — FINASTERIDE 5 MG PO TABS
5.0000 mg | ORAL_TABLET | Freq: Every day | ORAL | Status: DC
  Administered 2013-03-22 – 2013-03-27 (×6): 5 mg via ORAL
  Filled 2013-03-22 (×6): qty 1

## 2013-03-22 MED ORDER — POTASSIUM CL IN DEXTROSE 5% 20 MEQ/L IV SOLN
20.0000 meq | INTRAVENOUS | Status: DC
  Administered 2013-03-22: 20 meq via INTRAVENOUS
  Filled 2013-03-22: qty 1000
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
## 2013-03-22 NOTE — Care Management Note (Signed)
  Page 1 of 1   03/22/2013     3:56:11 PM   CARE MANAGEMENT NOTE 03/22/2013  Patient:  Norman Davis,Norman Davis   Account Number:  [id number]  Date Initiated:  03/22/2013  Documentation initiated by:  Ronny Flurry  Subjective/Objective Assessment:     Action/Plan:   Anticipated DC Date:  03/23/2013   Anticipated DC Plan:  Home health   In-house referral  Clinical Social Worker         Choice offered to / List presented to:             Status of service:   Medicare Important Message given?   (If response is "NO", the following Medicare IM given date fields will be blank) Date Medicare IM given:   Date Additional Medicare IM given:    Discharge Disposition:    Per UR Regulation:    If discussed at Long Length of Stay Meetings, dates discussed:    Comments:  03-22-13 Patient and sister refusing SNF , Patient  is a VA patient . Patient and sister unsure of VA MD name . Sister provided patient's VA nurse name Carllita [phone] ,called same left voice mail just for her to call me back .  Called April at [facility] [phone] ext [phone] , patinet ' s [place] MD is Dr Ena Dawley at [facility] , VA social worker is Victorino Dike [phone] ext [phone] left voice mail for [employer] .  Spoke with [facility] , she is leaving Dr Ena Dawley a note to order home health PT . Per [place] a [place] nurse will follow up with patient on Monday November 224, 2014 and set up home health .  Patient and sister Norman Davis ) aware .  Ronny Flurry RN BSN  [phone]

## 2013-03-22 NOTE — Progress Notes (Signed)
Agree with above.  Clinically he is back to his normal self he states.  I think he is at risk for recurrence but would not offer any surgery to him unless needed.
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
## 2013-03-23 MED ORDER — TAMSULOSIN HCL 0.4 MG PO CAPS: 0.4000 mg | ORAL_CAPSULE | Freq: Every day | ORAL | Status: DC

## 2013-03-23 MED ORDER — DOCUSATE SODIUM 100 MG PO CAPS: 100.0000 mg | ORAL_CAPSULE | Freq: Two times a day (BID) | ORAL | Status: DC | PRN

## 2013-03-23 MED ORDER — DOXAZOSIN MESYLATE 4 MG PO TABS
4.0000 mg | ORAL_TABLET | Freq: Every day | ORAL | Status: DC
  Administered 2013-03-23: 4 mg via ORAL
  Filled 2013-03-23 (×2): qty 1

## 2013-03-23 MED ORDER — PANTOPRAZOLE SODIUM 40 MG PO TBEC
40.0000 mg | DELAYED_RELEASE_TABLET | Freq: Two times a day (BID) | ORAL | Status: DC
  Administered 2013-03-23 – 2013-03-27 (×8): 40 mg via ORAL
  Filled 2013-03-23 (×8): qty 1

## 2013-03-23 MED ORDER — CLOPIDOGREL BISULFATE 75 MG PO TABS
75.0000 mg | ORAL_TABLET | Freq: Every day | ORAL | Status: DC
  Administered 2013-03-24 – 2013-03-27 (×4): 75 mg via ORAL
  Filled 2013-03-23 (×5): qty 1

## 2013-03-23 MED ORDER — BISACODYL 10 MG RE SUPP: 10.0000 mg | Freq: Every day | RECTAL | Status: DC | PRN
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
## 2013-03-23 NOTE — Progress Notes (Signed)
Clinical Social Work Department BRIEF PSYCHOSOCIAL ASSESSMENT 03/23/2013  Patient:  Norman Davis, Norman Davis     Account Number:  [id number]     Admit date:  03/16/2013  Clinical Social Worker:  Hendricks Milo  Date/Time:  03/23/2013 03:41 PM  Referred by:  Physician  Date Referred:  03/23/2013 Referred for  SNF Placement   Other Referral:   Interview type:  Family Other interview type:    PSYCHOSOCIAL DATA Living Status:  [place] Admitted from facility:   Level of care:   Primary support name:  Norman Davis [phone] Primary support relationship to patient:  SIBLING Degree of support available:   Very supportive, patient lives with his sister Norman Davis.    CURRENT CONCERNS  Other Concerns:    SOCIAL WORK ASSESSMENT / PLAN Clinical Child psychotherapist (CSW) received call from RN stating that patient was suppose to D/C to home today but the sister reported to the RN that she could not care for the patient if he can't walk. CSW contacted sister Norman Davis to discuss a safe D/C plan for patient. Sister was agreeable to SNF search in [place].   Assessment/plan status:  Psychosocial Support/Ongoing Assessment of Needs Other assessment/ plan:   Information/referral to community resources:    PATIENT'S/FAMILY'S RESPONSE TO PLAN OF CARE: Sister thanked CSW for phone call.

## 2013-03-23 NOTE — Progress Notes (Signed)
Clinical Social Work Department CLINICAL SOCIAL WORK PLACEMENT NOTE 03/23/2013  Patient:  LAMEL, MCCARLEY  Account Number:  [id number] Admit date:  03/16/2013  Clinical Social Worker:  Jetta Lout, Theresia Majors  Date/time:  03/23/2013 03:47 PM  Clinical Social Work is seeking post-discharge placement for this patient at the following level of care:   SKILLED NURSING   (*CSW will update this form in Epic as items are completed)   03/23/2013  Patient/family provided with [facility] Department of Clinical Social Work's list of facilities offering this level of care within the geographic area requested by the patient (or if unable, by the patient's family).  03/23/2013  Patient/family informed of their freedom to choose among providers that offer the needed level of care, that participate in Medicare, Medicaid or managed care program needed by the patient, have an available bed and are willing to accept the patient.  03/23/2013  Patient/family informed of [facility]' ownership interest in [facility], as well as of the fact that they are under no obligation to receive care at this facility.  PASARR submitted to [employer] on  PASARR number received from EDS on   FL2 transmitted to all facilities in geographic area requested by pt/family on  03/23/2013 FL2 transmitted to all facilities within larger geographic area on   Patient informed that his/her managed care company has contracts with or will negotiate with  certain facilities, including the following:     Patient/family informed of bed offers received:   Patient chooses bed at  Physician recommends and patient chooses bed at    Patient to be transferred to  on   Patient to be transferred to facility by   The following physician request were entered in Epic:   Additional Comments: CSW waited to submit PASARR with a signed 30 day note. CSW put 30 day note on shadow chart.
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
## 2013-03-23 NOTE — Progress Notes (Signed)
Called sister to inform abouit the discharge , patient asked this writer if the patient is walking on his own at this time,  This Clinical research associate stated that I havent seen patient walked, per Pt recommendation is SNF placement but i told her that she had a discussion with SW about it and that she is refusing SNF. Patient sister stated that she is not able to care for the patient if the patient is not able to walk and be able to go to the bathroom by himself.SW made aware.

## 2013-03-23 NOTE — Plan of Care (Signed)
Problem: Phase II Progression Outcomes Goal: Obtain order to discontinue catheter if appropriate Outcome: Not Progressing Patient having urinary retention

## 2013-03-24 MED ORDER — DOXAZOSIN MESYLATE 8 MG PO TABS
8.0000 mg | ORAL_TABLET | Freq: Every day | ORAL | Status: DC
  Administered 2013-03-24 – 2013-03-27 (×4): 8 mg via ORAL
  Filled 2013-03-24 (×3): qty 1

## 2013-03-24 NOTE — Progress Notes (Signed)
Pt has not voided since foley discontinued at 1330.  Bladder scanned for 98 ml.  Dr. York Spaniel texted and informed.  We will try to not insert foley but if pt has >250 cc, may insert foley for urinary retention.
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
## 2013-03-25 MED ORDER — DSS 100 MG PO CAPS: 100.0000 mg | ORAL_CAPSULE | Freq: Two times a day (BID) | ORAL | Status: DC | PRN

## 2013-03-25 NOTE — Clinical Social Work Note (Signed)
CSW spoke to pt's sister, Teresa Pelton ([phone]), regarding pt's SNF bed offers. Jonna Clark stated that she is legally blind and cannot tour facilities, and expressed concern to CSW about placing brother without any knowledge regarding SNF's. CSW informed Jonna Clark that CSW is not able to provide opinions or recommendations to SNF placement and advised Jonna Clark to reach out to friends and family for help. Jonna Clark stated that she does have friends and family, but was not "comfortable" with them touring since they did not "know my brother like I do." CSW and Jonna Clark agreed on a compromise for SNF whom offered a bed to pt to be contacted by CSW and request for the SNF to call and speak with Jonna Clark about their facility. Jonna Clark was understanding and agreeable to this compromise. CSW contacted [facility], [facility], and [facility] (the bed placement options for pt). These facilities are to call and speak to [place] regarding their facilities. CSW to continue to follow and assist with discharge planning needs.  Darlyn Chamber, LCSWA Clinical Social Worker [phone]
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
## 2013-03-25 NOTE — Clinical Social Work Note (Addendum)
CSW and [facility] medical team have completed necessary paperwork items in order for pt to discharge to SNF on 03/25/2013. Per pt's sister, Teresa Pelton, pt's sister has chosen [facility] as SNF placement for pt. Currently [facility] has a bed available for pt, but it clarifying insurance information. Pt's sister and SNF are working together to [employer] approval. Due to shift change for CSW, admissions coordinator is to call 6N RN when facility is ready for RN to call for transportation with [facility] ([phone]).  CSW followed-up with RN regarding information above. RN is aware that CSW has completed discharge packet and transportation form for discharge to SNF. RN was given [facility] number. CSW has also electronically sent pt's discharge information to [facility]. If pt does not discharge this evening, CSW will follow-up with SNF in the morning (03/26/2013).  03/26/2013 2:18pm CSW received call from [place] liaison regarding insurance information. CSW called Marylene Land [phone], *[phone]) with [employer]. Marylene Land stated that [employer] needed PT and OT notes from the last 24 hours for pt.  CSW faxed PT notes Marylene Land, and informed RN of need for OT notes. CSW to fax OT notes to Marylene Land once completed by RN. CSW to continue to follow and assist with discharge planning needs.  3:29pm CSW received call from [place] with [facility], stating that pt's PT notes were not "sufficient information." CSW requested that Marylene Land speak with pt's RN regarding mobility for pt. Marylene Land was agreeable to calling [facility] 6N unit to speak with pt's RN. CSW to continue to follow and assist with discharge planning needs.  Darlyn Chamber, LCSWA Clinical Social Worker [phone]
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
## 2013-03-26 NOTE — Progress Notes (Signed)
Patient needs maximum assist with his ADL's such as grooming, bathing, toileting and dressing and eating.
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
## 2013-03-27 NOTE — Progress Notes (Signed)
Discharge to [facility]. Report given to [place]. RN . Patient was transported by [facility] Alert and oriented, not in any distress during transfer,

## 2013-03-27 NOTE — Clinical Social Work Note (Signed)
CSW received call from admission liaison for [facility] regarding pt's approval from insurance for admission to SNF today 03/27/2013. CSW updated discharge packet and sent clinicals to [facility]. CSW followed-up with RN and has been given approval to call for [facility]. [facility] has been called.  Darlyn Chamber, LCSWA Clinical Social Worker [phone]

## 2013-04-02 ENCOUNTER — Encounter: Payer: Self-pay | Admitting: Internal Medicine

## 2013-04-02 ENCOUNTER — Non-Acute Institutional Stay (SKILLED_NURSING_FACILITY): Payer: Medicare Other | Admitting: Internal Medicine

## 2013-04-02 DIAGNOSIS — F209 Schizophrenia, unspecified: Secondary | ICD-10-CM

## 2013-04-02 DIAGNOSIS — K56609 Unspecified intestinal obstruction, unspecified as to partial versus complete obstruction: Secondary | ICD-10-CM

## 2013-04-02 DIAGNOSIS — Z87898 Personal history of other specified conditions: Secondary | ICD-10-CM | POA: Insufficient documentation

## 2013-04-02 DIAGNOSIS — N179 Acute kidney failure, unspecified: Secondary | ICD-10-CM

## 2013-04-02 DIAGNOSIS — E7800 Pure hypercholesterolemia, unspecified: Secondary | ICD-10-CM

## 2013-04-02 DIAGNOSIS — E872 Acidosis: Secondary | ICD-10-CM

## 2013-04-02 DIAGNOSIS — I10 Essential (primary) hypertension: Secondary | ICD-10-CM

## 2013-04-02 DIAGNOSIS — I219 Acute myocardial infarction, unspecified: Secondary | ICD-10-CM | POA: Insufficient documentation

## 2013-04-02 DIAGNOSIS — Z87448 Personal history of other diseases of urinary system: Secondary | ICD-10-CM

## 2013-04-02 NOTE — Assessment & Plan Note (Signed)
On plavix because allergy to ASA

## 2013-04-02 NOTE — Assessment & Plan Note (Signed)
On risperdol;will continue

## 2013-04-02 NOTE — Assessment & Plan Note (Signed)
Resolved in hosp with IVF and bicarb drip

## 2013-04-02 NOTE — Assessment & Plan Note (Signed)
From ATN/NSAID- had hyper Na, hypo K+ AG acidosis - resolved with IVF and bicarb drip

## 2013-04-02 NOTE — Progress Notes (Signed)
MRN: [id number] Name: Norman Davis  Sex: male Age: 72 y.o. DOB: 18-Jul-1940  PSC #: Ronni Rumble Facility/Room:  Level Of Care: SNF Provider: Merrilee Seashore D Emergency Contacts: Extended Emergency Contact Information Primary Emergency Contact: Matthews,Lillie Address: [address]          [place], [place] [address] [place] Home Phone: [phone] Relation: Sister Secondary Emergency Contact: Leona Carry Address: [address]          [place], [place] [address] [place] Home Phone: [phone] Mobile Phone: [phone] Relation: Relative  Code Status:   Allergies: Aspirin and Penicillins  Chief Complaint  Patient presents with  . nursing home admission    HPI: Patient is 72 y.o. male who is admitted after hospitalization for SBO and ARF.  Past Medical History  Diagnosis Date  . Schizophrenia   . Hypercholesteremia   . Hypertension   . H/O urinary retention   . Myocardial infarction     Past Surgical History  Procedure Laterality Date  . Total hip arthroplasty    . Pelvic fracture surgery    . Liver surgery        Medication List       This list is accurate as of: 04/02/13  9:51 PM.  Always use your most recent med list.               amLODipine 10 MG tablet  Commonly known as:  NORVASC  Take 10 mg by mouth daily.     bisacodyl 10 MG suppository  Commonly known as:  DULCOLAX  Place 1 suppository (10 mg total) rectally daily as needed for moderate constipation.     brimonidine 0.2 % ophthalmic solution  Commonly known as:  ALPHAGAN  Place 1 drop into both eyes 2 (two) times daily.     clopidogrel 75 MG tablet  Commonly known as:  PLAVIX  Take 75 mg by mouth daily with breakfast.     doxazosin 8 MG tablet  Commonly known as:  CARDURA  Take 8 mg by mouth daily.     DSS 100 MG Caps  Take 100 mg by mouth 2 (two) times daily as needed for mild constipation.     finasteride 5 MG tablet  Commonly  known as:  PROSCAR  Take 5 mg by mouth daily.     mirtazapine 30 MG tablet  Commonly known as:  REMERON  Take 30 mg by mouth at bedtime.     pantoprazole 40 MG tablet  Commonly known as:  PROTONIX  Take 40 mg by mouth daily.     pravastatin 40 MG tablet  Commonly known as:  PRAVACHOL  Take 40 mg by mouth daily.     risperiDONE 3 MG tablet  Commonly known as:  RISPERDAL  Take 3 mg by mouth at bedtime.     senna 8.6 MG Tabs tablet  Commonly known as:  SENOKOT  Take 1 tablet by mouth daily.     traZODone 50 MG tablet  Commonly known as:  DESYREL  Take 50 mg by mouth at bedtime.        No orders of the defined types were placed in this encounter.    Immunization History  Administered Date(s) Administered  . Influenza,inj,Quad PF,36+ Mos 03/18/2013  . Pneumococcal Polysaccharide-23 03/18/2013    History  Substance Use Topics  . Smoking status: Former Smoker -- 1.00 packs/day    Types: Cigarettes  . Smokeless tobacco: Never Used  . Alcohol  Use: No    Family history is noncontributory    Review of Systems - unable to obtain meaningful sec to dementia   Filed Vitals:   04/02/13 1409  BP: 140/79  Pulse: 80  Temp: 98 F (36.7 C)  Resp: 20    Physical Exam  GENERAL APPEARANCE: Alert,minimally  conversant. Appropriately groomed. No acute distress.  SKIN: No diaphoresis rash HEAD: Normocephalic, atraumatic  EYES: Conjunctiva/lids clear. Pupils round, reactive. EOMs intact.  EARS: External exam WNL, canals clear. Hearing grossly normal.  NOSE: No deformity or discharge.  MOUTH/THROAT: Lips w/o lesions.   RESPIRATORY: Breathing is even, unlabored. Lung sounds are clear   CARDIOVASCULAR: Heart RRR no murmurs, rubs or gallops. trace peripheral edema.  GASTROINTESTINAL: Abdomen is soft, non-tender, not distended w/ normal bowel sounds. GENITOURINARY: Bladder non tender, not distended  MUSCULOSKELETAL: No abnormal joints or musculature NEUROLOGIC: Oriented X1.  Cranial nerves 2-12 grossly intact. Moves all extremities no tremor. PSYCHIATRIC: Mood and affect appropriate to situation, no behavioral issues  Patient Active Problem List   Diagnosis Date Noted  . Hypercholesteremia   . Hypertension   . H/O urinary retention   . Myocardial infarction   . Gallstones 03/17/2013  . Renal stone 03/17/2013  . SBO (small bowel obstruction) 03/16/2013  . AKI (acute kidney injury) 03/16/2013  . High anion gap metabolic acidosis 03/16/2013  . Schizophrenia in remission 03/16/2013    CBC    Component Value Date/Time   WBC 8.1 03/20/2013 0845   RBC 4.19* 03/20/2013 0845   HGB 11.8* 03/20/2013 0845   HCT 35.5* 03/20/2013 0845   PLT 149* 03/20/2013 0845   MCV 84.7 03/20/2013 0845   LYMPHSABS 0.6* 03/16/2013 1219   MONOABS 1.3* 03/16/2013 1219   EOSABS 0.1 03/16/2013 1219   BASOSABS 0.0 03/16/2013 1219    CMP     Component Value Date/Time   NA 141 03/22/2013 0605   K 3.3* 03/22/2013 0605   CL 110 03/22/2013 0605   CO2 22 03/22/2013 0605   GLUCOSE 105* 03/22/2013 0605   BUN 11 03/22/2013 0605   CREATININE 0.85 03/22/2013 0605   CALCIUM 8.2* 03/22/2013 0605   PROT 6.1 03/19/2013 0512   ALBUMIN 2.2* 03/19/2013 0512   AST 18 03/19/2013 0512   ALT 14 03/19/2013 0512   ALKPHOS 93 03/19/2013 0512   BILITOT 0.5 03/19/2013 0512   GFRNONAA 84* 03/22/2013 0605   GFRAA >90 03/22/2013 0605    Assessment and Plan  SBO (small bowel obstruction) Presented with nausea and vomiting-resolved in the hospital-able to eat now  AKI (acute kidney injury) From ATN/NSAID- had hyper Na, hypo K+ AG acidosis - resolved with IVF and bicarb drip  High anion gap metabolic acidosis Resolved in hosp with IVF and bicarb drip  Schizophrenia in remission On risperdol;will continue  Hypertension Continue Norvasc  H/O urinary retention Continue proscar and cardura  Hypercholesteremia Continue pravachol  Myocardial infarction On plavix because allergy to  ASA    Margit Hanks, MD

## 2013-04-02 NOTE — Assessment & Plan Note (Signed)
Presented with nausea and vomiting-resolved in the hospital-able to eat now

## 2013-04-02 NOTE — Assessment & Plan Note (Signed)
Continue pravachol.  

## 2013-04-02 NOTE — Assessment & Plan Note (Signed)
Continue Norvasc

## 2013-04-02 NOTE — Assessment & Plan Note (Signed)
Continue proscar and cardura  

## 2013-04-09 ENCOUNTER — Encounter: Payer: Self-pay | Admitting: Internal Medicine

## 2013-04-11 ENCOUNTER — Non-Acute Institutional Stay (SKILLED_NURSING_FACILITY): Payer: Medicare Other | Admitting: Internal Medicine

## 2013-04-11 ENCOUNTER — Encounter: Payer: Self-pay | Admitting: Internal Medicine

## 2013-04-11 DIAGNOSIS — J449 Chronic obstructive pulmonary disease, unspecified: Secondary | ICD-10-CM

## 2013-04-11 DIAGNOSIS — Z87448 Personal history of other diseases of urinary system: Secondary | ICD-10-CM

## 2013-04-11 DIAGNOSIS — Z87898 Personal history of other specified conditions: Secondary | ICD-10-CM

## 2013-04-11 DIAGNOSIS — N179 Acute kidney failure, unspecified: Secondary | ICD-10-CM

## 2013-04-11 DIAGNOSIS — F209 Schizophrenia, unspecified: Secondary | ICD-10-CM

## 2013-04-11 DIAGNOSIS — E7800 Pure hypercholesterolemia, unspecified: Secondary | ICD-10-CM

## 2013-04-11 DIAGNOSIS — I10 Essential (primary) hypertension: Secondary | ICD-10-CM

## 2013-04-11 NOTE — Progress Notes (Signed)
MRN: [id number] Name: Norman Davis  Sex: male Age: 72 y.o. DOB: 1940/12/16  PSC #: Ronni Rumble Facility/Room: 125A Level Of Care: SNF Provider: Merrilee Seashore D Emergency Contacts: Extended Emergency Contact Information Primary Emergency Contact: Matthews,Lillie Address: [address]          [place], [place] [address] [place] Home Phone: [phone] Relation: Sister Secondary Emergency Contact: Leona Carry Address: [address]          [place], [place] [address] [place] Home Phone: [phone] Mobile Phone: [phone] Relation: Relative  Code Status: Full  Allergies: Aspirin and Penicillins  Chief Complaint  Patient presents with  . Discharge Note    HPI: Patient is 72 y.o. male who was admitted after a SBO and ARF who is now ready to be discharged to home. Past Medical History  Diagnosis Date  . Schizophrenia   . Hypercholesteremia   . Hypertension   . H/O urinary retention   . Myocardial infarction   . GERD (gastroesophageal reflux disease)   . Small bowel obstruction due to adhesions   . 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 Nephrolithiasis     right  . Cholelithiasis     Past Surgical History  Procedure Laterality Date  . Total hip arthroplasty    . Pelvic fracture surgery    . Liver surgery        Medication List       This list is accurate as of: 04/11/13  4:12 PM.  Always use your most recent med list.               albuterol 108 (90 BASE) MCG/ACT inhaler  Commonly known as:  PROVENTIL HFA;VENTOLIN HFA  Inhale 2 puffs into the lungs every 4 (four) hours as needed for wheezing or shortness of breath.     amLODipine 10 MG tablet  Commonly known as:  NORVASC  Take 10 mg by mouth daily.     bisacodyl 10 MG suppository  Commonly known as:  DULCOLAX  Place 1 suppository (10 mg total) rectally daily as needed for moderate constipation.     brimonidine 0.2 % ophthalmic  solution  Commonly known as:  ALPHAGAN  Place 1 drop into both eyes 2 (two) times daily.     clopidogrel 75 MG tablet  Commonly known as:  PLAVIX  Take 75 mg by mouth daily with breakfast.     doxazosin 8 MG tablet  Commonly known as:  CARDURA  Take 8 mg by mouth daily.     finasteride 5 MG tablet  Commonly known as:  PROSCAR  Take 5 mg by mouth daily.     mirtazapine 30 MG tablet  Commonly known as:  REMERON  Take 30 mg by mouth at bedtime.     omeprazole 40 MG capsule  Commonly known as:  PRILOSEC  Take 40 mg by mouth daily.     pravastatin 40 MG tablet  Commonly known as:  PRAVACHOL  Take 40 mg by mouth daily.     ranitidine 150 MG capsule  Commonly known as:  ZANTAC  Take 150 mg by mouth every evening.     risperiDONE 3 MG tablet  Commonly known as:  RISPERDAL  Take 3 mg by mouth at bedtime.     senna 8.6 MG Tabs tablet  Commonly known as:  SENOKOT  Take 1 tablet by mouth daily.     traZODone 50 MG  tablet  Commonly known as:  DESYREL  Take 50 mg by mouth at bedtime.        Meds ordered this encounter  Medications  . omeprazole (PRILOSEC) 40 MG capsule    Sig: Take 40 mg by mouth daily.  . ranitidine (ZANTAC) 150 MG capsule    Sig: Take 150 mg by mouth every evening.  Marland Kitchen albuterol (PROVENTIL HFA;VENTOLIN HFA) 108 (90 BASE) MCG/ACT inhaler    Sig: Inhale 2 puffs into the lungs every 4 (four) hours as needed for wheezing or shortness of breath.    Immunization History  Administered Date(s) Administered  . Influenza,inj,Quad PF,36+ Mos 03/18/2013  . Pneumococcal Polysaccharide-23 03/18/2013    History  Substance Use Topics  . Smoking status: Former Smoker -- 1.00 packs/day    Types: Cigarettes  . Smokeless tobacco: Never Used  . Alcohol Use: No    Filed Vitals:   04/11/13 1407  BP: 132/70  Pulse: 70  Temp: 98.1 F (36.7 C)  Resp: 20    Physical Exam  GENERAL APPEARANCE: Alert, conversant. Appropriately groomed. No acute distress.   HEENT: Unremarkable. RESPIRATORY: Breathing is even, unlabored. Lung sounds are clear   CARDIOVASCULAR: Heart RRR no murmurs, rubs or gallops. No peripheral edema.  GASTROINTESTINAL: Abdomen is soft, non-tender, not distended w/ normal bowel sounds.  NEUROLOGIC: Cranial nerves 2-12 grossly intact. Moves all extremities no tremor.  Patient Active Problem List   Diagnosis Date Noted  . Hypercholesteremia   . Hypertension   . H/O urinary retention   . Myocardial infarction   . Gallstones 03/17/2013  . Renal stone 03/17/2013  . SBO (small bowel obstruction) 03/16/2013  . AKI (acute kidney injury) 03/16/2013  . High anion gap metabolic acidosis 03/16/2013  . Schizophrenia in remission 03/16/2013    CBC    Component Value Date/Time   WBC 8.1 03/20/2013 0845   RBC 4.19* 03/20/2013 0845   HGB 11.8* 03/20/2013 0845   HCT 35.5* 03/20/2013 0845   PLT 149* 03/20/2013 0845   MCV 84.7 03/20/2013 0845   LYMPHSABS 0.6* 03/16/2013 1219   MONOABS 1.3* 03/16/2013 1219   EOSABS 0.1 03/16/2013 1219   BASOSABS 0.0 03/16/2013 1219    CMP     Component Value Date/Time   NA 141 03/22/2013 0605   K 3.3* 03/22/2013 0605   CL 110 03/22/2013 0605   CO2 22 03/22/2013 0605   GLUCOSE 105* 03/22/2013 0605   BUN 11 03/22/2013 0605   CREATININE 0.85 03/22/2013 0605   CALCIUM 8.2* 03/22/2013 0605   PROT 6.1 03/19/2013 0512   ALBUMIN 2.2* 03/19/2013 0512   AST 18 03/19/2013 0512   ALT 14 03/19/2013 0512   ALKPHOS 93 03/19/2013 0512   BILITOT 0.5 03/19/2013 0512   GFRNONAA 84* 03/22/2013 0605   GFRAA >90 03/22/2013 0605    Assessment and Plan  Patient is being discharged to home in stable and improved condition with HH, PT, and OT. During exam pt stated he had been using an alb MDI before he went to the hospital so alb MDI was ordered and written for pt today.  Margit Hanks, MD

## 2013-04-12 ENCOUNTER — Ambulatory Visit: Payer: Medicare (Managed Care) | Admitting: Internal Medicine

## 2015-04-28 ENCOUNTER — Emergency Department (HOSPITAL_COMMUNITY): Payer: Medicare Other

## 2015-04-28 ENCOUNTER — Encounter (HOSPITAL_COMMUNITY): Payer: Self-pay | Admitting: Vascular Surgery

## 2015-04-28 ENCOUNTER — Inpatient Hospital Stay (HOSPITAL_COMMUNITY)
Admission: EM | Admit: 2015-04-28 | Discharge: 2015-05-01 | DRG: 281 | Disposition: A | Discharge status: Skilled Nursing Facility | Payer: Medicare Other | Attending: Internal Medicine | Admitting: Internal Medicine

## 2015-04-28 ENCOUNTER — Other Ambulatory Visit: Payer: Self-pay

## 2015-04-28 ENCOUNTER — Emergency Department (HOSPITAL_COMMUNITY): Admit: 2015-04-28 | Discharge: 2015-04-28 | Disposition: A | Discharge status: Home / Self Care | Payer: Medicare Other

## 2015-04-28 DIAGNOSIS — E872 Acidosis: Secondary | ICD-10-CM

## 2015-04-28 DIAGNOSIS — R079 Chest pain, unspecified: Secondary | ICD-10-CM | POA: Diagnosis not present

## 2015-04-28 DIAGNOSIS — R7989 Other specified abnormal findings of blood chemistry: Secondary | ICD-10-CM | POA: Insufficient documentation

## 2015-04-28 DIAGNOSIS — R791 Abnormal coagulation profile: Secondary | ICD-10-CM | POA: Diagnosis not present

## 2015-04-28 DIAGNOSIS — N179 Acute kidney failure, unspecified: Secondary | ICD-10-CM | POA: Diagnosis not present

## 2015-04-28 DIAGNOSIS — F259 Schizoaffective disorder, unspecified: Secondary | ICD-10-CM | POA: Diagnosis present

## 2015-04-28 DIAGNOSIS — F329 Major depressive disorder, single episode, unspecified: Secondary | ICD-10-CM | POA: Diagnosis present

## 2015-04-28 DIAGNOSIS — Z886 Allergy status to analgesic agent status: Secondary | ICD-10-CM

## 2015-04-28 DIAGNOSIS — K219 Gastro-esophageal reflux disease without esophagitis: Secondary | ICD-10-CM | POA: Diagnosis present

## 2015-04-28 DIAGNOSIS — R29898 Other symptoms and signs involving the musculoskeletal system: Secondary | ICD-10-CM | POA: Diagnosis present

## 2015-04-28 DIAGNOSIS — I2510 Atherosclerotic heart disease of native coronary artery without angina pectoris: Secondary | ICD-10-CM | POA: Diagnosis present

## 2015-04-28 DIAGNOSIS — E785 Hyperlipidemia, unspecified: Secondary | ICD-10-CM | POA: Diagnosis present

## 2015-04-28 DIAGNOSIS — F209 Schizophrenia, unspecified: Secondary | ICD-10-CM | POA: Diagnosis not present

## 2015-04-28 DIAGNOSIS — J449 Chronic obstructive pulmonary disease, unspecified: Secondary | ICD-10-CM | POA: Diagnosis present

## 2015-04-28 DIAGNOSIS — Z880 Allergy status to penicillin: Secondary | ICD-10-CM

## 2015-04-28 DIAGNOSIS — K8020 Calculus of gallbladder without cholecystitis without obstruction: Secondary | ICD-10-CM

## 2015-04-28 DIAGNOSIS — I319 Disease of pericardium, unspecified: Secondary | ICD-10-CM | POA: Diagnosis present

## 2015-04-28 DIAGNOSIS — R109 Unspecified abdominal pain: Secondary | ICD-10-CM | POA: Diagnosis present

## 2015-04-28 DIAGNOSIS — M4806 Spinal stenosis, lumbar region: Secondary | ICD-10-CM | POA: Diagnosis not present

## 2015-04-28 DIAGNOSIS — K56609 Unspecified intestinal obstruction, unspecified as to partial versus complete obstruction: Secondary | ICD-10-CM

## 2015-04-28 DIAGNOSIS — I214 Non-ST elevation (NSTEMI) myocardial infarction: Secondary | ICD-10-CM | POA: Diagnosis not present

## 2015-04-28 DIAGNOSIS — M79605 Pain in left leg: Secondary | ICD-10-CM | POA: Diagnosis present

## 2015-04-28 DIAGNOSIS — Z87891 Personal history of nicotine dependence: Secondary | ICD-10-CM

## 2015-04-28 DIAGNOSIS — E860 Dehydration: Secondary | ICD-10-CM | POA: Diagnosis present

## 2015-04-28 DIAGNOSIS — M7989 Other specified soft tissue disorders: Secondary | ICD-10-CM | POA: Diagnosis not present

## 2015-04-28 DIAGNOSIS — M549 Dorsalgia, unspecified: Secondary | ICD-10-CM | POA: Diagnosis present

## 2015-04-28 DIAGNOSIS — Z96649 Presence of unspecified artificial hip joint: Secondary | ICD-10-CM | POA: Diagnosis present

## 2015-04-28 DIAGNOSIS — G544 Lumbosacral root disorders, not elsewhere classified: Secondary | ICD-10-CM | POA: Diagnosis present

## 2015-04-28 DIAGNOSIS — M791 Myalgia: Secondary | ICD-10-CM | POA: Diagnosis present

## 2015-04-28 DIAGNOSIS — N400 Enlarged prostate without lower urinary tract symptoms: Secondary | ICD-10-CM | POA: Diagnosis present

## 2015-04-28 DIAGNOSIS — I509 Heart failure, unspecified: Secondary | ICD-10-CM | POA: Diagnosis not present

## 2015-04-28 DIAGNOSIS — Z7902 Long term (current) use of antithrombotics/antiplatelets: Secondary | ICD-10-CM | POA: Diagnosis not present

## 2015-04-28 DIAGNOSIS — J439 Emphysema, unspecified: Secondary | ICD-10-CM | POA: Diagnosis not present

## 2015-04-28 DIAGNOSIS — E7800 Pure hypercholesterolemia, unspecified: Secondary | ICD-10-CM

## 2015-04-28 DIAGNOSIS — M79609 Pain in unspecified limb: Secondary | ICD-10-CM

## 2015-04-28 DIAGNOSIS — I10 Essential (primary) hypertension: Secondary | ICD-10-CM

## 2015-04-28 DIAGNOSIS — N200 Calculus of kidney: Secondary | ICD-10-CM

## 2015-04-28 DIAGNOSIS — Z87898 Personal history of other specified conditions: Secondary | ICD-10-CM

## 2015-04-28 DIAGNOSIS — E8729 Other acidosis: Secondary | ICD-10-CM

## 2015-04-28 DIAGNOSIS — Z79899 Other long term (current) drug therapy: Secondary | ICD-10-CM | POA: Diagnosis not present

## 2015-04-28 HISTORY — DX: Reserved for inherently not codable concepts without codable children: IMO0001

## 2015-04-28 LAB — CBC WITH DIFFERENTIAL/PLATELET
Basophils Absolute: 0 10*3/uL (ref 0.0–0.1)
Basophils Relative: 0 %
Eosinophils Absolute: 0.1 10*3/uL (ref 0.0–0.7)
Eosinophils Relative: 1 %
HCT: 40.5 % (ref 39.0–52.0)
Hemoglobin: 13 g/dL (ref 13.0–17.0)
Lymphocytes Relative: 23 %
Lymphs Abs: 1.5 10*3/uL (ref 0.7–4.0)
MCH: 28.5 pg (ref 26.0–34.0)
MCHC: 32.1 g/dL (ref 30.0–36.0)
MCV: 88.8 fL (ref 78.0–100.0)
Monocytes Absolute: 0.5 10*3/uL (ref 0.1–1.0)
Monocytes Relative: 8 %
Neutro Abs: 4.4 10*3/uL (ref 1.7–7.7)
Neutrophils Relative %: 68 %
Platelets: 166 10*3/uL (ref 150–400)
RBC: 4.56 MIL/uL (ref 4.22–5.81)
RDW: 15.4 % (ref 11.5–15.5)
WBC: 6.4 10*3/uL (ref 4.0–10.5)

## 2015-04-28 LAB — BASIC METABOLIC PANEL
Anion gap: 8 (ref 5–15)
BUN: 17 mg/dL (ref 6–20)
CO2: 23 mmol/L (ref 22–32)
Calcium: 9.2 mg/dL (ref 8.9–10.3)
Chloride: 115 mmol/L — ABNORMAL HIGH (ref 101–111)
Creatinine, Ser: 1.25 mg/dL — ABNORMAL HIGH (ref 0.61–1.24)
GFR calc Af Amer: >60 mL/min (ref >60)
GFR calc non Af Amer: 54 mL/min — ABNORMAL LOW (ref >60)
Glucose, Bld: 108 mg/dL — ABNORMAL HIGH (ref 65–99)
Potassium: 3.8 mmol/L (ref 3.5–5.1)
Sodium: 146 mmol/L — ABNORMAL HIGH (ref 135–145)

## 2015-04-28 LAB — URINALYSIS, ROUTINE W REFLEX MICROSCOPIC
Bilirubin Urine: NEGATIVE
Glucose, UA: NEGATIVE mg/dL
Hgb urine dipstick: NEGATIVE
Ketones, ur: 15 mg/dL — AB
Leukocytes, UA: NEGATIVE
Nitrite: NEGATIVE
Protein, ur: 30 mg/dL — AB
Specific Gravity, Urine: 1.025 (ref 1.005–1.030)
pH: 6 (ref 5.0–8.0)

## 2015-04-28 LAB — URINE MICROSCOPIC-ADD ON
RBC / HPF: NONE SEEN RBC/hpf (ref 0–5)
WBC, UA: NONE SEEN WBC/hpf (ref 0–5)

## 2015-04-28 LAB — BRAIN NATRIURETIC PEPTIDE: B Natriuretic Peptide: 23.1 pg/mL (ref 0.0–100.0)

## 2015-04-28 LAB — TROPONIN I: Troponin I: 0.11 ng/mL — ABNORMAL HIGH (ref <0.031)

## 2015-04-28 MED ORDER — FLUOXETINE HCL 20 MG PO CAPS
20.0000 mg | ORAL_CAPSULE | Freq: Every day | ORAL | Status: DC
  Administered 2015-04-29 – 2015-05-01 (×3): 20 mg via ORAL
  Filled 2015-04-28 (×3): qty 1

## 2015-04-28 MED ORDER — ALBUTEROL SULFATE (2.5 MG/3ML) 0.083% IN NEBU: 2.5000 mg | INHALATION_SOLUTION | RESPIRATORY_TRACT | Status: DC | PRN

## 2015-04-28 MED ORDER — LORATADINE 10 MG PO TABS
10.0000 mg | ORAL_TABLET | Freq: Every day | ORAL | Status: DC
  Administered 2015-04-29 – 2015-05-01 (×3): 10 mg via ORAL
  Filled 2015-04-28 (×3): qty 1

## 2015-04-28 MED ORDER — NITROGLYCERIN 0.4 MG SL SUBL
0.4000 mg | SUBLINGUAL_TABLET | SUBLINGUAL | Status: AC | PRN
  Administered 2015-04-28 (×3): 0.4 mg via SUBLINGUAL
  Filled 2015-04-28: qty 1

## 2015-04-28 MED ORDER — NITROGLYCERIN 0.4 MG SL SUBL: 0.4000 mg | SUBLINGUAL_TABLET | SUBLINGUAL | Status: DC | PRN

## 2015-04-28 MED ORDER — MORPHINE SULFATE (PF) 2 MG/ML IV SOLN: 2.0000 mg | INTRAVENOUS | Status: DC | PRN

## 2015-04-28 MED ORDER — PRAVASTATIN SODIUM 40 MG PO TABS
40.0000 mg | ORAL_TABLET | Freq: Every day | ORAL | Status: DC
  Administered 2015-04-29 – 2015-04-30 (×2): 40 mg via ORAL
  Filled 2015-04-28 (×2): qty 1

## 2015-04-28 MED ORDER — SODIUM CHLORIDE 0.9 % IV SOLN
INTRAVENOUS | Status: DC
  Administered 2015-04-29 (×2): via INTRAVENOUS

## 2015-04-28 MED ORDER — HEPARIN (PORCINE) IN NACL 100-0.45 UNIT/ML-% IJ SOLN
1100.0000 [IU]/h | INTRAMUSCULAR | Status: DC
Start: 2015-04-28 — End: 2015-04-30
  Administered 2015-04-29 (×2): 1100 [IU]/h via INTRAVENOUS
  Filled 2015-04-28 (×2): qty 250

## 2015-04-28 MED ORDER — CLOPIDOGREL BISULFATE 75 MG PO TABS
75.0000 mg | ORAL_TABLET | Freq: Every day | ORAL | Status: DC
  Administered 2015-04-29 – 2015-05-01 (×3): 75 mg via ORAL
  Filled 2015-04-28 (×3): qty 1

## 2015-04-28 MED ORDER — BARIUM SULFATE 2.1 % PO SUSP
ORAL | Status: AC
  Administered 2015-04-28: 1 mL
  Filled 2015-04-28: qty 2

## 2015-04-28 MED ORDER — ALBUTEROL SULFATE HFA 108 (90 BASE) MCG/ACT IN AERS: 2.0000 | INHALATION_SPRAY | RESPIRATORY_TRACT | Status: DC | PRN

## 2015-04-28 MED ORDER — FINASTERIDE 5 MG PO TABS
5.0000 mg | ORAL_TABLET | Freq: Every day | ORAL | Status: DC
  Administered 2015-04-29 – 2015-05-01 (×3): 5 mg via ORAL
  Filled 2015-04-28 (×3): qty 1

## 2015-04-28 MED ORDER — OXYCODONE-ACETAMINOPHEN 5-325 MG PO TABS
1.0000 | ORAL_TABLET | ORAL | Status: DC | PRN
  Administered 2015-04-29 – 2015-05-01 (×3): 1 via ORAL
  Filled 2015-04-28 (×3): qty 1

## 2015-04-28 MED ORDER — PANTOPRAZOLE SODIUM 40 MG PO TBEC
40.0000 mg | DELAYED_RELEASE_TABLET | Freq: Every day | ORAL | Status: DC
  Administered 2015-04-29 – 2015-04-30 (×2): 40 mg via ORAL
  Filled 2015-04-28 (×2): qty 1

## 2015-04-28 MED ORDER — AMLODIPINE BESYLATE 10 MG PO TABS
10.0000 mg | ORAL_TABLET | Freq: Every day | ORAL | Status: DC
  Administered 2015-04-29 – 2015-05-01 (×3): 10 mg via ORAL
  Filled 2015-04-28 (×3): qty 1

## 2015-04-28 MED ORDER — BRIMONIDINE TARTRATE 0.2 % OP SOLN
1.0000 [drp] | Freq: Two times a day (BID) | OPHTHALMIC | Status: DC
  Administered 2015-04-29 – 2015-05-01 (×6): 1 [drp] via OPHTHALMIC
  Filled 2015-04-28: qty 5

## 2015-04-28 MED ORDER — CARVEDILOL 3.125 MG PO TABS
3.1250 mg | ORAL_TABLET | Freq: Two times a day (BID) | ORAL | Status: DC
  Administered 2015-04-29 – 2015-05-01 (×6): 3.125 mg via ORAL
  Filled 2015-04-28 (×6): qty 1

## 2015-04-28 MED ORDER — MIRTAZAPINE 30 MG PO TABS
30.0000 mg | ORAL_TABLET | Freq: Every day | ORAL | Status: DC
  Administered 2015-04-29 – 2015-04-30 (×3): 30 mg via ORAL
  Filled 2015-04-28 (×4): qty 1

## 2015-04-28 MED ORDER — MIRABEGRON ER 25 MG PO TB24
25.0000 mg | ORAL_TABLET | Freq: Every day | ORAL | Status: DC
  Administered 2015-04-29 – 2015-05-01 (×3): 25 mg via ORAL
  Filled 2015-04-28 (×3): qty 1

## 2015-04-28 MED ORDER — TIOTROPIUM BROMIDE MONOHYDRATE 18 MCG IN CAPS
18.0000 ug | ORAL_CAPSULE | Freq: Every day | RESPIRATORY_TRACT | Status: DC
  Administered 2015-04-29 – 2015-05-01 (×3): 18 ug via RESPIRATORY_TRACT
  Filled 2015-04-28: qty 5

## 2015-04-28 MED ORDER — SODIUM CHLORIDE 0.9 % IJ SOLN
3.0000 mL | Freq: Two times a day (BID) | INTRAMUSCULAR | Status: DC
  Administered 2015-04-30: 3 mL via INTRAVENOUS

## 2015-04-28 MED ORDER — HYDROXYZINE HCL 50 MG/ML IM SOLN: 25.0000 mg | Freq: Four times a day (QID) | INTRAMUSCULAR | Status: DC | PRN

## 2015-04-28 MED ORDER — HEPARIN BOLUS VIA INFUSION
4000.0000 [IU] | Freq: Once | INTRAVENOUS | Status: AC
  Administered 2015-04-29: 4000 [IU] via INTRAVENOUS
  Filled 2015-04-28: qty 4000

## 2015-04-28 NOTE — Progress Notes (Signed)
*  PRELIMINARY RESULTS* Vascular Ultrasound Lower extremity venous duplex has been completed.  Preliminary findings: No evidence of acute DVT or baker's cyst. Areas of chronic sparing are noted in bilateral common femoral veins, right gastroc vein, and left femoral vein. Again, no evidence of acute DVT.   Farrel DemarkJill Eunice, RDMS, RVT  04/28/2015, 7:08 PM

## 2015-04-28 NOTE — Progress Notes (Signed)
Patient admitted to room, second dose of contrast just given. Oriented to room and equipment, patient aware of scan. Call light within reach.

## 2015-04-28 NOTE — ED Provider Notes (Signed)
This patient's care was assumed from Crisler, PA-C at shift change. Please see his note for further. This patient presented with chronic worsening lower back pain with bilateral lower extremity weakness. Patient is able to ambulate in the room with me with his cane. Patient also complains of chest tightness and minimally for the past week and returned since 6 PM today. Potential change patient is wearing blood work and MRI of his lumbar spine. To me the patient denies any loss of bowel or bladder control, difficulty urinating, numbness, tingling. He does report pain with ambulation. He is able to ambulate in the room with me with his cane. Patient endorses chest tightness sick since p.m. today. Troponin is elevated 0.11. Patient is allergic to aspirin. EKG shows no STEMI. Will admit for ACS rule out. Not concern for cauda equina at this time. This patient was accepted for admission by Dr. Clyde Lundborg. He requested Tele bed temporary orders.   This patient was discussed with and evaluated by Dr. Ranae Palms who agrees with assessment and plan.   Results for orders placed or performed during the hospital encounter of 04/28/15  Basic metabolic panel  Result Value Ref Range   Sodium 146 (H) 135 - 145 mmol/L   Potassium 3.8 3.5 - 5.1 mmol/L   Chloride 115 (H) 101 - 111 mmol/L   CO2 23 22 - 32 mmol/L   Glucose, Bld 108 (H) 65 - 99 mg/dL   BUN 17 6 - 20 mg/dL   Creatinine, Ser 1.61 (H) 0.61 - 1.24 mg/dL   Calcium 9.2 8.9 - 09.6 mg/dL   GFR calc non Af Amer 54 (L) >60 mL/min   GFR calc Af Amer >60 >60 mL/min   Anion gap 8 5 - 15  CBC with Differential  Result Value Ref Range   WBC 6.4 4.0 - 10.5 K/uL   RBC 4.56 4.22 - 5.81 MIL/uL   Hemoglobin 13.0 13.0 - 17.0 g/dL   HCT 04.5 40.9 - 81.1 %   MCV 88.8 78.0 - 100.0 fL   MCH 28.5 26.0 - 34.0 pg   MCHC 32.1 30.0 - 36.0 g/dL   RDW 91.4 78.2 - 95.6 %   Platelets 166 150 - 400 K/uL   Neutrophils Relative % 68 %   Neutro Abs 4.4 1.7 - 7.7 K/uL   Lymphocytes  Relative 23 %   Lymphs Abs 1.5 0.7 - 4.0 K/uL   Monocytes Relative 8 %   Monocytes Absolute 0.5 0.1 - 1.0 K/uL   Eosinophils Relative 1 %   Eosinophils Absolute 0.1 0.0 - 0.7 K/uL   Basophils Relative 0 %   Basophils Absolute 0.0 0.0 - 0.1 K/uL  Urinalysis, Routine w reflex microscopic  Result Value Ref Range   Color, Urine AMBER (A) YELLOW   APPearance CLEAR CLEAR   Specific Gravity, Urine 1.025 1.005 - 1.030   pH 6.0 5.0 - 8.0   Glucose, UA NEGATIVE NEGATIVE mg/dL   Hgb urine dipstick NEGATIVE NEGATIVE   Bilirubin Urine NEGATIVE NEGATIVE   Ketones, ur 15 (A) NEGATIVE mg/dL   Protein, ur 30 (A) NEGATIVE mg/dL   Nitrite NEGATIVE NEGATIVE   Leukocytes, UA NEGATIVE NEGATIVE  Troponin I  Result Value Ref Range   Troponin I 0.11 (H) <0.031 ng/mL  Brain natriuretic peptide  Result Value Ref Range   B Natriuretic Peptide 23.1 0.0 - 100.0 pg/mL  Urine microscopic-add on  Result Value Ref Range   Squamous Epithelial / LPF 0-5 (A) NONE SEEN   WBC,  UA NONE SEEN 0 - 5 WBC/hpf   RBC / HPF NONE SEEN 0 - 5 RBC/hpf   Bacteria, UA RARE (A) NONE SEEN   Casts HYALINE CASTS (A) NEGATIVE   Dg Chest 2 View  04/28/2015  CLINICAL DATA:  Shortness of Breath EXAM: CHEST - 2 VIEW COMPARISON:  03/16/2013 FINDINGS: Cardiac shadow is within normal limits. The thoracic aorta is mildly tortuous. Emphysematous changes are noted with bullous formation particularly on the right. No focal infiltrate or sizable effusion is seen. No bony abnormality is noted. IMPRESSION: Emphysematous changes without acute abnormality. Electronically Signed   By: Alcide CleverMark  Lukens M.D.   On: 04/28/2015 18:54   Mr Lumbar Spine Wo Contrast  04/28/2015  CLINICAL DATA:  Pain in both legs with weakness since December 21st. Some shortness of breath since that time. EXAM: MRI LUMBAR SPINE WITHOUT CONTRAST TECHNIQUE: Multiplanar, multisequence MR imaging of the lumbar spine was performed. No intravenous contrast was administered. COMPARISON:   CT abdomen and pelvis 03/16/2013. No prior lumbar cross-sectional imaging available. FINDINGS: Segmentation: 5 lumbar type vertebral bodies are assumed. Alignment: The patient has undergone posterior and interbody fusion at L5-S1. Within limits for assessment on MR, the fusion appears solid. There is 4 mm of degenerative anterolisthesis at L4-5. Vertebrae: No worrisome osseous lesion.Fatty replaced marrow above and below L4-5 reflect chronic degenerative disc disease. Acute bone marrow edema accompanies a Schmorl's node projecting superiorly from L3-4. Multiple screws related to LEFT sacroiliac joint fusion. Conus medullaris: Normal in size, signal, and location. Serpiginous nerve roots are indicative of severe stenosis. Paraspinal tissues: No evidence for hydronephrosis or paravertebral mass. Disc levels: L1-L2:  Mild bulge.  Mild facet arthropathy.  No impingement. L2-L3: Central and leftward protrusion extends to the foramen and beyond. BILATERAL facet arthropathy. Moderate stenosis. LEFT greater than RIGHT subarticular zone and foraminal zone narrowing affecting the L2 and L3 nerve roots. L3-L4: No disc protrusion centrally, but annular bulging and osteophytic ridging in conjunction with severe facet arthropathy and ligamentum flavum hypertrophy contribute to subarticular zone and foraminal zone narrowing. There is moderate to severe stenosis. BILATERAL L3 and L4 nerve root impingement are likely. L4-L5: 4 mm anterolisthesis. Severe disc space narrowing with osseous ridging. Mild annular bulging centrally. Severe facet arthropathy. BILATERAL subarticular zone and foraminal zone narrowing affect the L4 and L5 nerve roots. L5-S1:  No residual impingement. 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 BILATERAL L4 and L5 nerve root impingement are noted. Please note that these changes could be worse with patient  standing particularly in flexion, if there is dynamic instability. Moderate to severe stenosis at L3-4, related to annular bulging and osseous ridging in conjunction with posterior element hypertrophy. Moderate stenosis at L2-3 related to central and leftward protrusion as well as posterior element hypertrophy. Solid appearing instrumented posterior and interbody fusion L5-S1. Electronically Signed   By: Elsie StainJohn T Curnes M.D.   On: 04/28/2015 20:50      Everlene FarrierWilliam Georgina Krist, PA-C 04/28/15 2223  Loren Raceravid Yelverton, MD 05/06/15 [phone]

## 2015-04-28 NOTE — ED Notes (Signed)
Pt reports to the ED for eval of left leg pain and giving out since 12/21. Pt had an injury to the left leg 30 years ago and has trouble with it occasionally. 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 Pt denies any recent injury. Also reports it is tingling. Pt alert, resp e/u, and skin warm and dry.

## 2015-04-28 NOTE — H&P (Signed)
[facility] History and Physical  Norman Davis ZOX:[id number] DOB: 06/06/1940 DOA: 04/28/2015  Referring physician: ED physician PCP: Bufford Spikes, DO  Specialists:   Chief Complaint: Left leg pain, chest pain, abdominal pain  HPI: Norman Davis is a 74 y.o. male with PMH of hypertension, hyperlipidemia, COPD, depression, GERD, schizophrenia, CAD, MI, kidney stone, gallstone, BPH, who presents with left leg pain, chest pain and abdominal pain.  Patient reports that he has been having left leg pain for about one week. He also has leg tingling sensations, but no weakness. Pt states that he had an injury to the left leg 30 years ago and has trouble with it occasionally. 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 Pt denies any recent injury.   Patient also reports having abdominal pain and nausea which have been going on for about one week. No diarrhea or vomiting. His abdominal pain is diffuse, constant, 8 out of 10 in severity. He had a normal bowel movement today.  Patient reports that he has been having chest pain for about one week. It is located in the substernal area, intermittent, 8 out of 10 in severity, nonradiating. It happens 3 times a day, each time it lasts for about 45 minutes and resolves spontaneously. It is associated with shortness of breath, but no cough, fever or chills. Patient does not have symptoms of UTI, unilateral weakness, rashes.  In ED, patient was found to have troponin 0.11, lipase 22, negative urinalysis, WBC 6.4, temperature normal, no tachycardia, acute renal injury with creatinine 1.25. Chest x-ray showed emphysematous, but no infiltration.  MRI of lumbar spine showed severe multifactorial spinal stenosis and bilateral L4 and L5 nerve root impingement. Patient is admitted to inpatient for further evaluation and treatment.  Where does patient live?   At home   Can patient participate in ADLs?   Little  Review of Systems:   General: no fevers, chills, no changes in body weight, has poor appetite, has fatigue HEENT: no blurry vision, hearing changes or sore throat Pulm: no dyspnea, coughing, wheezing CV: has chest pain, palpitations Abd: has nausea, abdominal pain, no diarrhea,  vomiting, constipation GU: no dysuria, burning on urination, increased urinary frequency, hematuria  Ext: no leg edema. Has left left pain. Neuro: has left leg tingling. No vision change or hearing loss Skin: no rash MSK: No muscle spasm, no deformity, no limitation of range of movement in spin Heme: No easy bruising.  Travel history: No recent long distant travel.  Allergy:  Allergies  Allergen Reactions  . Aspirin     unknown  . Penicillins     Unknown Has patient had a PCN reaction causing immediate rash, facial/tongue/throat swelling, SOB or lightheadedness with hypotension: NO Has patient had a PCN reaction causing severe rash involving mucus membranes or skin necrosis: NO Has patient had a PCN reaction that required hospitalization NO Has patient had a PCN reaction occurring within the last 10 years: NO If all of the above answers are "NO", then may proceed with Cephalosporin use.    Past Medical History  Diagnosis Date  . Schizophrenia (HCC)   . Hypercholesteremia   . Hypertension   . H/O urinary retention   . Myocardial infarction (HCC)   . GERD (gastroesophageal reflux disease)   . Small bowel obstruction due to adhesions (HCC)   . 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 Nephrolithiasis  right  . Cholelithiasis     Past Surgical History  Procedure Laterality Date  . Total hip arthroplasty    . Pelvic fracture surgery    . Liver surgery      Social History:  reports that he has quit smoking. His smoking use included Cigarettes. He smoked 1.00 pack per day. He has never used smokeless tobacco. He reports that he does not drink alcohol or use illicit  drugs.  Family History:  Family History  Problem Relation Age of Onset  . Cancer Mother   . Cancer Father      Prior to Admission medications   Medication Sig Start Date End Date Taking? Authorizing Provider  albuterol (PROVENTIL HFA;VENTOLIN HFA) 108 (90 BASE) MCG/ACT inhaler Inhale 2 puffs into the lungs every 4 (four) hours as needed for wheezing or shortness of breath.   Yes Historical Provider, MD  amLODipine (NORVASC) 10 MG tablet Take 10 mg by mouth daily.   Yes Historical Provider, MD  brimonidine (ALPHAGAN) 0.2 % ophthalmic solution Place 1 drop into both eyes 2 (two) times daily.   Yes Historical Provider, MD  cetirizine (ZYRTEC) 10 MG tablet Take 10 mg by mouth daily.   Yes Historical Provider, MD  clopidogrel (PLAVIX) 75 MG tablet Take 75 mg by mouth daily with breakfast.   Yes Historical Provider, MD  finasteride (PROSCAR) 5 MG tablet Take 5 mg by mouth daily.   Yes Historical Provider, MD  FLUoxetine (PROZAC) 20 MG capsule Take 20 mg by mouth daily.   Yes Historical Provider, MD  meloxicam (MOBIC) 7.5 MG tablet Take 7.5 mg by mouth daily.   Yes Historical Provider, MD  Mirabegron (MYRBETRIQ PO) Take 1 tablet by mouth daily.   Yes Historical Provider, MD  mirtazapine (REMERON) 30 MG tablet Take 30 mg by mouth at bedtime.   Yes Historical Provider, MD  pantoprazole (PROTONIX) 40 MG tablet Take 40 mg by mouth daily.   Yes Historical Provider, MD  pravastatin (PRAVACHOL) 40 MG tablet Take 40 mg by mouth daily.   Yes Historical Provider, MD  tiotropium (SPIRIVA) 18 MCG inhalation capsule Place 18 mcg into inhaler and inhale daily.   Yes Historical Provider, MD  bisacodyl (DULCOLAX) 10 MG suppository Place 1 suppository (10 mg total) rectally daily as needed for moderate constipation. Patient not taking: Reported on 04/28/2015 03/23/13   Esperanza Sheets, MD    Physical Exam: Filed Vitals:   04/28/15 2200 04/28/15 2215 04/28/15 2220 04/28/15 2309  BP: 131/94 134/97  127/86   Pulse: 79 83  67  Temp:    97.6 F (36.4 C)  TempSrc:    Oral  Resp: Height:   5' 8.9" (1.75 m)   Weight:   89.812 kg (198 lb)   SpO2: 95% 97%  97%   General: Not in acute distress HEENT:       Eyes: PERRL, EOMI, no scleral icterus.       ENT: No discharge from the ears and nose, no pharynx injection, no tonsillar enlargement.        Neck: No JVD, no bruit, no mass felt. Heme: No neck lymph node enlargement. Cardiac: S1/S2, RRR, No murmurs, No gallops or rubs. Pulm:  No rales, wheezing, rhonchi or rubs. Has reproducible left chest wall tenderness. Abd: distended and diffusely tender, no rebound pain, no organomegaly, BS present. Ext: No pitting leg edema bilaterally. 2+DP/PT pulse bilaterally. Musculoskeletal: No joint deformities, No joint redness or warmth, no limitation  of ROM in spin. Has left leg pain and tingling Skin: No rashes.  Neuro: Alert, oriented X3, cranial nerves II-XII grossly intact, muscle strength 5/5 in all extremities, sensation to light touch intact. Knee reflex 1+ bilaterally. Psych: Patient is not psychotic, no suicidal or hemocidal ideation.  Labs on Admission:  Basic Metabolic Panel:  Recent Labs Lab 04/28/15 1838  NA 146*  K 3.8  CL 115*  CO2 23  GLUCOSE 108*  BUN 17  CREATININE 1.25*  CALCIUM 9.2   Liver Function Tests: No results for input(s): AST, ALT, ALKPHOS, BILITOT, PROT, ALBUMIN in the last 168 hours.  Recent Labs Lab 04/28/15 2355  LIPASE 22   No results for input(s): AMMONIA in the last 168 hours. CBC:  Recent Labs Lab 04/28/15 1838  WBC 6.4  NEUTROABS 4.4  HGB 13.0  HCT 40.5  MCV 88.8  PLT 166   Cardiac Enzymes:  Recent Labs Lab 04/28/15 1838 04/28/15 2355  TROPONINI 0.11* 0.13*    BNP (last 3 results)  Recent Labs  04/28/15 1838  BNP 23.1    ProBNP (last 3 results) No results for input(s): PROBNP in the last 8760 hours.  CBG: No results for input(s): GLUCAP in the last 168  hours.  Radiological Exams on Admission: Ct Abdomen Pelvis Wo Contrast  04/29/2015  CLINICAL DATA:  Acute onset of shortness of breath, bilateral leg pain and weakness. Initial encounter. EXAM: CT ABDOMEN AND PELVIS WITHOUT CONTRAST TECHNIQUE: Multidetector CT imaging of the abdomen and pelvis was performed following the standard protocol without IV contrast. COMPARISON:  CT of the abdomen and pelvis from 03/16/2013, and MRI of the lumbar spine performed 04/28/2015 FINDINGS: The visualized lung bases are clear. The liver and spleen are unremarkable in appearance. Stones are seen layering dependently within the gallbladder. The gallbladder is otherwise unremarkable. The pancreas and adrenal glands are unremarkable. The kidneys are unremarkable in appearance. There is no evidence of hydronephrosis. No renal or ureteral stones are seen. Mild nonspecific perinephric stranding is noted bilaterally. No free fluid is identified. The small bowel is unremarkable in appearance. The stomach is within normal limits. No acute vascular abnormalities are seen. Minimal calcification is noted along the abdominal aorta. An IVC filter is noted. The appendix is difficult to fully assess but appears grossly unremarkable, without evidence of appendicitis. Contrast progresses to the level of the transverse colon. The colon is unremarkable in appearance. The bladder is mildly distended and grossly unremarkable. The prostate remains normal in size. No inguinal lymphadenopathy is seen. No acute osseous abnormalities are identified. Diffuse sclerotic change is noted within the pelvis. Postoperative change is noted at the left iliac bone. The patient is status post lumbar spinal fusion at L5-S1. An apparent large Schmorl's node is noted at the inferior endplate of L3, as noted on prior MRI. IMPRESSION: 1. No acute abnormality seen within the abdomen or pelvis. 2. Cholelithiasis.  Gallbladder otherwise unremarkable. Electronically Signed    By: Roanna RaiderJeffery  Chang M.D.   On: 04/29/2015 02:43   Dg Chest 2 View  04/28/2015  CLINICAL DATA:  Shortness of Breath EXAM: CHEST - 2 VIEW COMPARISON:  03/16/2013 FINDINGS: Cardiac shadow is within normal limits. The thoracic aorta is mildly tortuous. Emphysematous changes are noted with bullous formation particularly on the right. No focal infiltrate or sizable effusion is seen. No bony abnormality is noted. IMPRESSION: Emphysematous changes without acute abnormality. Electronically Signed   By: Alcide CleverMark  Lukens M.D.   On: 04/28/2015 18:54   Mr Lumbar  Spine Wo Contrast  04/28/2015  CLINICAL DATA:  Pain in both legs with weakness since December 21st. Some shortness of breath since that time. EXAM: MRI LUMBAR SPINE WITHOUT CONTRAST TECHNIQUE: Multiplanar, multisequence MR imaging of the lumbar spine was performed. No intravenous contrast was administered. COMPARISON:  CT abdomen and pelvis 03/16/2013. No prior lumbar cross-sectional imaging available. FINDINGS: Segmentation: 5 lumbar type vertebral bodies are assumed. Alignment: The patient has undergone posterior and interbody fusion at L5-S1. Within limits for assessment on MR, the fusion appears solid. There is 4 mm of degenerative anterolisthesis at L4-5. Vertebrae: No worrisome osseous lesion.Fatty replaced marrow above and below L4-5 reflect chronic degenerative disc disease. Acute bone marrow edema accompanies a Schmorl's node projecting superiorly from L3-4. Multiple screws related to LEFT sacroiliac joint fusion. Conus medullaris: Normal in size, signal, and location. Serpiginous nerve roots are indicative of severe stenosis. Paraspinal tissues: No evidence for hydronephrosis or paravertebral mass. Disc levels: L1-L2:  Mild bulge.  Mild facet arthropathy.  No impingement. L2-L3: Central and leftward protrusion extends to the foramen and beyond. BILATERAL facet arthropathy. Moderate stenosis. LEFT greater than RIGHT subarticular zone and foraminal zone  narrowing affecting the L2 and L3 nerve roots. L3-L4: No disc protrusion centrally, but annular bulging and osteophytic ridging in conjunction with severe facet arthropathy and ligamentum flavum hypertrophy contribute to subarticular zone and foraminal zone narrowing. There is moderate to severe stenosis. BILATERAL L3 and L4 nerve root impingement are likely. L4-L5: 4 mm anterolisthesis. Severe disc space narrowing with osseous ridging. Mild annular bulging centrally. Severe facet arthropathy. BILATERAL subarticular zone and foraminal zone narrowing affect the L4 and L5 nerve roots. L5-S1:  No residual impingement. 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 BILATERAL L4 and L5 nerve root impingement are noted. Please note that these changes could be worse with patient standing particularly in flexion, if there is dynamic instability. Moderate to severe stenosis at L3-4, related to annular bulging and osseous ridging in conjunction with posterior element hypertrophy. Moderate stenosis at L2-3 related to central and leftward protrusion as well as posterior element hypertrophy. Solid appearing instrumented posterior and interbody fusion L5-S1. Electronically Signed   By: Elsie Stain M.D.   On: 04/28/2015 20:50    EKG: Independently reviewed.  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 There is some component of musculoskeletal pain given tenderness in the chest wall, but he also has elevated troponin 0.11, indicating non-STEMI  - will  admit to Tele bed  - start IV heparin - cycle CE q6 x3 and repeat her EKG in the am  - Nitroglycerin, Morphine, pravastatin, plavix - Patient is allergic to aspirin - Start low-dose Coreg 3.125 mg twice a day - Risk factor stratification: will check FLP, UDS and A1C  - 2d echo - Need to call cardiology consult in AM.  Abdominal pain: Etiology is unclear. Lipase is negative. -CT abdomen/pelvis -When necessary morphine for pain -When necessary hydroxyzine for nausea  AKI: Likely due to prerenal secondary to dehydration and continuation of NSAIDs - IVF: Normal saline 75  mL per hour - Check FeNa - f/u CT-abd/pelvis to r/o hydronephrosis - Follow up renal function by BMP - Hold mobic  Schizophrenia: in remission (HCC) -No acute issues  HLD: Last LDL was not on record -Continue home medications: Pravastatin -Check FLP  HTN: -Coreg -Amlodipine  COPD (chronic obstructive pulmonary disease) (HCC): stable -prn albuterol and Spiriva inhaler  Depression: Stable, no suicidal or homicidal ideations. -Continue home medications: Prozac, Remeron  GERD: -Protonix  Leg pain, left: most likely due to severe multifactorial spinal stenosis and nerve root impingement as evidenced by MRI-lumbar. LE venous doppler is negative for DVT. - prn percocet  DVT ppx: on IV Heparin  Code Status: Full code Family Communication: None at bed side.   Disposition Plan: Admit to inpatient   Date of Service 04/29/2015    Lorretta Harp [facility] Pager [phone]  If 7PM-7AM, please contact night-coverage [email or web address] Password [email or web address] 04/29/2015, 3:15 AM

## 2015-04-28 NOTE — Progress Notes (Signed)
ANTICOAGULATION CONSULT NOTE - Initial Consult  Pharmacy Consult for Heparin Indication: chest pain/ACS  Allergies  Allergen Reactions  . Aspirin     unknown  . Penicillins     Unknown Has patient had a PCN reaction causing immediate rash, facial/tongue/throat swelling, SOB or lightheadedness with hypotension: NO Has patient had a PCN reaction causing severe rash involving mucus membranes or skin necrosis: NO Has patient had a PCN reaction that required hospitalization NO Has patient had a PCN reaction occurring within the last 10 years: NO If all of the above answers are "NO", then may proceed with Cephalosporin use.    Patient Measurements: Height: 5' 8.9" (175 cm) Weight: 198 lb (89.812 kg) IBW/kg (Calculated) : 70.47   Vital Signs: Temp: 98.7 F (37.1 C) (12/27 1531) Temp Source: Oral (12/27 1531) BP: 134/97 mmHg (12/27 2215) Pulse Rate: 83 (12/27 2215)  Labs:  Recent Labs  04/28/15 1838  HGB 13.0  HCT 40.5  PLT 166  CREATININE 1.25*  TROPONINI 0.11*    Estimated Creatinine Clearance: 55.6 mL/min (by C-G formula based on Cr of 1.25).   Medical History: Past Medical History  Diagnosis Date  . Schizophrenia (HCC)   . Hypercholesteremia   . Hypertension   . H/O urinary retention   . Myocardial infarction (HCC)   . GERD (gastroesophageal reflux disease)   . Small bowel obstruction due to adhesions (HCC)   . 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 Nephrolithiasis     right  . Cholelithiasis     Medications:  Albuterol  Norvasc  Alphagan  Zyrtec  Plavix  Proscar  Prozac  Mobic  Myrbetriq  Remeron  Protonix  Pravachol  Spiriva    Assessment: 74 y.o. male with chest pain, elevated cardiac markers, for heparin  Goal of Therapy:  Heparin level 0.3-0.7 units/ml Monitor platelets by anticoagulation protocol: Yes   Plan:  Heparin 4000 units IV bolus, then start heparin 1100 units/hr Follow-up am labs.   Lazarus [place]bury, Gary FleetGregory  Vernon 04/28/2015,10:59 PM

## 2015-04-28 NOTE — ED Provider Notes (Signed)
CSN: [id number]     Arrival date & time 04/28/15  1448 History   First MD Initiated Contact with Patient 04/28/15 1811     Chief Complaint  Patient presents with  . Leg Pain     (Consider location/radiation/quality/duration/timing/severity/associated sxs/prior Treatment) HPI Patient presents to the emergency department with pain in both legs and weakness in his legs giving out since December 21.  The patient states that he is also has some shortness of breath since that time.  The patient is pretty vague with his symptoms.  Patient states that movement and palpation make the pain worse, states that he is also having back pain.  Patient denies fever, nausea, vomiting, headache, blurred vision, neck pain, chest pain, shortness breath, fever, cough, dysuria, incontinence, bloody stool, hematemesis, near syncope or syncope.  Patient states that he did not take any medications prior to arrival Past Medical History  Diagnosis Date  . Schizophrenia (HCC)   . Hypercholesteremia   . Hypertension   . H/O urinary retention   . Myocardial infarction (HCC)   . GERD (gastroesophageal reflux disease)   . Small bowel obstruction due to adhesions (HCC)   . 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 Nephrolithiasis     right  . Cholelithiasis    Past Surgical History  Procedure Laterality Date  . Total hip arthroplasty    . Pelvic fracture surgery    . Liver surgery     Family History  Problem Relation Age of Onset  . Cancer Mother   . Cancer Father    Social History  Substance Use Topics  . Smoking status: Former Smoker -- 1.00 packs/day    Types: Cigarettes  . Smokeless tobacco: Never Used  . Alcohol Use: No    Review of Systems  Level V caveat applies due to poor historian  Allergies  Aspirin and Penicillins  Home Medications   Prior to Admission medications   Medication Sig Start Date End Date Taking? Authorizing Provider  albuterol (PROVENTIL HFA;VENTOLIN HFA)  108 (90 BASE) MCG/ACT inhaler Inhale 2 puffs into the lungs every 4 (four) hours as needed for wheezing or shortness of breath.    Historical Provider, MD  amLODipine (NORVASC) 10 MG tablet Take 10 mg by mouth daily.    Historical Provider, MD  bisacodyl (DULCOLAX) 10 MG suppository Place 1 suppository (10 mg total) rectally daily as needed for moderate constipation. 03/23/13   Esperanza Sheets, MD  brimonidine (ALPHAGAN) 0.2 % ophthalmic solution Place 1 drop into both eyes 2 (two) times daily.    Historical Provider, MD  clopidogrel (PLAVIX) 75 MG tablet Take 75 mg by mouth daily with breakfast.    Historical Provider, MD  doxazosin (CARDURA) 8 MG tablet Take 8 mg by mouth daily.    Historical Provider, MD  finasteride (PROSCAR) 5 MG tablet Take 5 mg by mouth daily.    Historical Provider, MD  mirtazapine (REMERON) 30 MG tablet Take 30 mg by mouth at bedtime.    Historical Provider, MD  omeprazole (PRILOSEC) 40 MG capsule Take 40 mg by mouth daily.    Historical Provider, MD  pravastatin (PRAVACHOL) 40 MG tablet Take 40 mg by mouth daily.    Historical Provider, MD  ranitidine (ZANTAC) 150 MG capsule Take 150 mg by mouth every evening.    Historical Provider, MD  risperiDONE (RISPERDAL) 3 MG tablet Take 3 mg by mouth at bedtime.    Historical Provider, MD  senna (SENOKOT) 8.6 MG TABS tablet Take 1 tablet by mouth daily.    Historical Provider, MD  traZODone (DESYREL) 50 MG tablet Take 50 mg by mouth at bedtime.    Historical Provider, MD   BP 138/93 mmHg  Pulse 73  Temp(Src) 98.7 F (37.1 C) (Oral)  Resp 20  SpO2 96% Physical Exam  Constitutional: He is oriented to person, place, and time. He appears well-developed and well-nourished. No distress.  HENT:  Head: Normocephalic and atraumatic.  Mouth/Throat: Oropharynx is clear and moist.  Eyes: Pupils are equal, round, and reactive to light.  Neck: Normal range of motion. Neck supple.  Cardiovascular: Normal rate, regular rhythm and  normal heart sounds.  Exam reveals no gallop and no friction rub.   No murmur heard. Pulmonary/Chest: Effort normal and breath sounds normal. No respiratory distress. He has no wheezes.  Abdominal: Soft. Bowel sounds are normal. He exhibits no distension. There is no tenderness.  Musculoskeletal: He exhibits edema.  Neurological: He is alert and oriented to person, place, and time. He exhibits normal muscle tone. Coordination normal.  Patient was able to stand at the bedside and get into bed.  The patient has some bilateral weakness in his lower extremities, but not significant.  He has 4-5 strength, able to test reflexes  Skin: Skin is warm and dry. No rash noted. No erythema.  Psychiatric: He has a normal mood and affect. His behavior is normal.  Nursing note and vitals reviewed.   ED Course  Procedures (including critical care time) Labs Review Labs Reviewed  CBC WITH DIFFERENTIAL/PLATELET  BASIC METABOLIC PANEL  URINALYSIS, ROUTINE W REFLEX MICROSCOPIC ([facility])  TROPONIN I  BRAIN NATRIURETIC PEPTIDE    Imaging Review Dg Chest 2 View  04/28/2015  CLINICAL DATA:  Shortness of Breath EXAM: CHEST - 2 VIEW COMPARISON:  03/16/2013 FINDINGS: Cardiac shadow is within normal limits. The thoracic aorta is mildly tortuous. Emphysematous changes are noted with bullous formation particularly on the right. No focal infiltrate or sizable effusion is seen. No bony abnormality is noted. IMPRESSION: Emphysematous changes without acute abnormality. Electronically Signed   By: Alcide CleverMark  Lukens M.D.   On: 04/28/2015 18:54   I have personally reviewed and evaluated these images and lab results as part of my medical decision-making.   EKG Interpretation None      MDM   Final diagnoses:  Leg weakness, bilateral   Patient is, negative DVT study other than chronic appearing clots in both common femoral veins.    Charlestine NightChristopher Enoc Getter, PA-C 04/28/15 1954  Loren Raceravid Yelverton, MD 05/02/15 [phone]

## 2015-04-29 ENCOUNTER — Inpatient Hospital Stay (HOSPITAL_COMMUNITY): Payer: Medicare Other

## 2015-04-29 ENCOUNTER — Encounter (HOSPITAL_COMMUNITY): Payer: Self-pay | Admitting: Radiology

## 2015-04-29 DIAGNOSIS — I10 Essential (primary) hypertension: Secondary | ICD-10-CM

## 2015-04-29 DIAGNOSIS — M4806 Spinal stenosis, lumbar region: Secondary | ICD-10-CM

## 2015-04-29 DIAGNOSIS — R079 Chest pain, unspecified: Secondary | ICD-10-CM

## 2015-04-29 DIAGNOSIS — I509 Heart failure, unspecified: Secondary | ICD-10-CM

## 2015-04-29 DIAGNOSIS — R109 Unspecified abdominal pain: Secondary | ICD-10-CM

## 2015-04-29 LAB — COMPREHENSIVE METABOLIC PANEL
ALBUMIN: 3 g/dL — AB (ref 3.5–5.0)
ALT: 11 U/L — ABNORMAL LOW (ref 17–63)
AST: 19 U/L (ref 15–41)
Alkaline Phosphatase: 110 U/L (ref 38–126)
Anion gap: 9 (ref 5–15)
BUN: 14 mg/dL (ref 6–20)
CHLORIDE: 112 mmol/L — AB (ref 101–111)
CO2: 24 mmol/L (ref 22–32)
Calcium: 8.7 mg/dL — ABNORMAL LOW (ref 8.9–10.3)
Creatinine, Ser: 1.04 mg/dL (ref 0.61–1.24)
GFR calc Af Amer: >60 mL/min (ref >60)
GFR calc non Af Amer: >60 mL/min (ref >60)
GLUCOSE: 94 mg/dL (ref 65–99)
POTASSIUM: 3.2 mmol/L — AB (ref 3.5–5.1)
Sodium: 145 mmol/L (ref 135–145)
Total Bilirubin: 0.7 mg/dL (ref 0.3–1.2)
Total Protein: 5.9 g/dL — ABNORMAL LOW (ref 6.5–8.1)

## 2015-04-29 LAB — CBC
HEMATOCRIT: 35.9 % — AB (ref 39.0–52.0)
Hemoglobin: 11.7 g/dL — ABNORMAL LOW (ref 13.0–17.0)
MCH: 29 pg (ref 26.0–34.0)
MCHC: 32.6 g/dL (ref 30.0–36.0)
MCV: 88.9 fL (ref 78.0–100.0)
PLATELETS: 143 10*3/uL — AB (ref 150–400)
RBC: 4.04 MIL/uL — ABNORMAL LOW (ref 4.22–5.81)
RDW: 15.2 % (ref 11.5–15.5)
WBC: 5.3 10*3/uL (ref 4.0–10.5)

## 2015-04-29 LAB — ABO/RH: ABO/RH(D): B POS

## 2015-04-29 LAB — TROPONIN I
TROPONIN I: 0.13 ng/mL — AB (ref <0.031)
TROPONIN I: 0.13 ng/mL — AB (ref <0.031)
Troponin I: 0.11 ng/mL — ABNORMAL HIGH (ref <0.031)

## 2015-04-29 LAB — PROTIME-INR
INR: 1.23 (ref 0.00–1.49)
Prothrombin Time: 15.6 seconds — ABNORMAL HIGH (ref 11.6–15.2)

## 2015-04-29 LAB — TYPE AND SCREEN
ABO/RH(D): B POS
ANTIBODY SCREEN: NEGATIVE

## 2015-04-29 LAB — HEPARIN LEVEL (UNFRACTIONATED): HEPARIN UNFRACTIONATED: 0.65 [IU]/mL (ref 0.30–0.70)

## 2015-04-29 LAB — LIPID PANEL
CHOL/HDL RATIO: 2.7 ratio
Cholesterol: 122 mg/dL (ref 0–200)
HDL: 46 mg/dL (ref >40)
LDL Cholesterol: 70 mg/dL (ref 0–99)
Triglycerides: 31 mg/dL (ref <150)
VLDL: 6 mg/dL (ref 0–40)

## 2015-04-29 LAB — APTT: APTT: 26 s (ref 24–37)

## 2015-04-29 LAB — LIPASE, BLOOD: LIPASE: 22 U/L (ref 11–51)

## 2015-04-29 LAB — D-DIMER, QUANTITATIVE: D-Dimer, Quant: 0.93 ug/mL-FEU — ABNORMAL HIGH (ref 0.00–0.50)

## 2015-04-29 MED ORDER — IOHEXOL 350 MG/ML SOLN
100.0000 mL | Freq: Once | INTRAVENOUS | Status: AC | PRN
  Administered 2015-04-29: 100 mL via INTRAVENOUS

## 2015-04-29 MED ORDER — POTASSIUM CHLORIDE CRYS ER 20 MEQ PO TBCR
40.0000 meq | EXTENDED_RELEASE_TABLET | Freq: Once | ORAL | Status: AC
  Administered 2015-04-29: 40 meq via ORAL
  Filled 2015-04-29: qty 2

## 2015-04-29 MED ORDER — COLCHICINE 0.6 MG PO TABS
0.6000 mg | ORAL_TABLET | Freq: Every day | ORAL | Status: DC
  Administered 2015-04-29 – 2015-05-01 (×3): 0.6 mg via ORAL
  Filled 2015-04-29 (×3): qty 1

## 2015-04-29 MED ORDER — IBUPROFEN 600 MG PO TABS
600.0000 mg | ORAL_TABLET | Freq: Three times a day (TID) | ORAL | Status: DC
  Administered 2015-04-29 – 2015-05-01 (×6): 600 mg via ORAL
  Filled 2015-04-29 (×6): qty 1

## 2015-04-29 NOTE — Progress Notes (Signed)
Patient back from CT scan, fluids started as ordered, heparin bolus given. Will give rest of meds once received from pharmacy. Call light within reach.

## 2015-04-29 NOTE — Progress Notes (Signed)
Patient ID: Norman Davis, male   DOB: 10-06-1940, 74 y.o.   MRN: [id number] Busy with PT

## 2015-04-29 NOTE — NC FL2 (Signed)
[place] MEDICAID FL2 LEVEL OF CARE SCREENING TOOL     IDENTIFICATION  Patient Name: Norman Davis Birthdate: 10-11-1940 Sex: male Admission Date (Current Location): 04/28/2015  [facility] and [place] Number:  Producer, television/film/video and Address:  [facility] [place]. [facility], [address], [place], [place] [address]      Provider Number: [id number]  Attending Physician Name and Address:  [name], DO  Relative Name and Phone Number:       Current Level of Care: Hospital Recommended Level of Care: Skilled Nursing Facility Prior Approval Number:    Date Approved/Denied:   PASRR Number:    Discharge Plan: SNF    Current Diagnoses: Patient Active Problem List   Diagnosis Date Noted  . Leg pain, left 04/28/2015  . Chest pain 04/28/2015  . NSTEMI (non-ST elevated myocardial infarction) (HCC) 04/28/2015  . Abdominal pain 04/28/2015  . COPD (chronic obstructive pulmonary disease) (HCC) 04/11/2013  . Hypercholesteremia   . Hypertension   . H/O urinary retention   . Myocardial infarction (HCC)   . Gallstones 03/17/2013  . Renal stone 03/17/2013  . SBO (small bowel obstruction) (HCC) 03/16/2013  . AKI (acute kidney injury) (HCC) 03/16/2013  . High anion gap metabolic acidosis 03/16/2013  . 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  BEHAVIORAL SYMPTOMS/MOOD NEUROLOGICAL BOWEL NUTRITION STATUS      Continent Diet  AMBULATORY STATUS COMMUNICATION OF NEEDS Skin   Limited Assist Verbally Normal                       Personal Care Assistance Level of Assistance  Bathing, Dressing Bathing Assistance: Limited assistance   Dressing Assistance: Limited assistance     Functional Limitations Info             SPECIAL CARE FACTORS FREQUENCY  PT (By licensed PT)     PT Frequency: 5/wk              Contractures       Additional Factors Info  Code Status, Allergies, Psychotropic Code Status Info: FULL Allergies Info: Aspirin, Penicillins Psychotropic Info: prozac, remeron         Current Medications (04/29/2015):  This is the current hospital active medication list Current Facility-Administered Medications  Medication Dose Route Frequency Provider Last Rate Last Dose  . 0.9 %  sodium chloride infusion   Intravenous Continuous Lorretta Harp, MD 75 mL/hr at 04/29/15 0203    . albuterol (PROVENTIL) (2.5 MG/3ML) 0.083% nebulizer solution 2.5 mg  2.5 mg Inhalation Q4H PRN Loren Racer, MD      . amLODipine (NORVASC) tablet 10 mg  10 mg Oral Daily Lorretta Harp, MD   10 mg at 04/29/15 0959  . brimonidine (ALPHAGAN) 0.2 % ophthalmic solution 1 drop  1 drop Both Eyes BID Lorretta Harp, MD   1 drop at 04/29/15 1000  . carvedilol (COREG) tablet 3.125 mg  3.125 mg Oral BID WC Lorretta Harp, MD   3.125 mg at 04/29/15 1757  . clopidogrel (PLAVIX) tablet 75 mg  75 mg Oral Q breakfast Lorretta Harp, MD   75 mg at 04/29/15 8119  . colchicine tablet 0.6 mg  0.6 mg Oral Daily Janetta Hora, PA-C   0.6 mg at 04/29/15 1547  . finasteride (PROSCAR) tablet 5 mg  5 mg Oral Daily Lorretta Harp,  MD   5 mg at 04/29/15 0959  . FLUoxetine (PROZAC) capsule 20 mg  20 mg Oral Daily Lorretta HarpXilin Niu, MD   20 mg at 04/29/15 1000  . heparin ADULT infusion 100 units/mL (25000 units/250 mL)  1,100 Units/hr Intravenous Continuous Lorretta HarpXilin Niu, MD 11 mL/hr at 04/29/15 0209 1,100 Units/hr at 04/29/15 0209  . hydrOXYzine (VISTARIL) injection 25 mg  25 mg Intramuscular Q6H PRN Lorretta HarpXilin Niu, MD      . ibuprofen (ADVIL,MOTRIN) tablet 600 mg  600 mg Oral TID WC Janetta HoraKathryn R Thompson, PA-C   600 mg at 04/29/15 1547  . loratadine (CLARITIN) tablet 10 mg  10 mg Oral Daily Lorretta HarpXilin Niu, MD   10 mg at 04/29/15 1000  . mirabegron ER (MYRBETRIQ) tablet 25 mg  25 mg Oral Daily Lorretta HarpXilin Niu, MD   25 mg at 04/29/15 1000  . mirtazapine (REMERON) tablet 30 mg  30 mg Oral QHS Lorretta HarpXilin Niu, MD    30 mg at 04/29/15 [id number]  . morphine 2 MG/ML injection 2 mg  2 mg Intravenous Q4H PRN Lorretta HarpXilin Niu, MD      . nitroGLYCERIN (NITROSTAT) SL tablet 0.4 mg  0.4 mg Sublingual Q5 Min x 3 PRN Lorretta HarpXilin Niu, MD      . oxyCODONE-acetaminophen (PERCOCET/ROXICET) 5-325 MG per tablet 1 tablet  1 tablet Oral Q4H PRN Lorretta HarpXilin Niu, MD   1 tablet at 04/29/15 1004  . pantoprazole (PROTONIX) EC tablet 40 mg  40 mg Oral Daily Lorretta HarpXilin Niu, MD   40 mg at 04/29/15 0959  . pravastatin (PRAVACHOL) tablet 40 mg  40 mg Oral q1800 Lorretta HarpXilin Niu, MD   40 mg at 04/29/15 1757  . sodium chloride 0.9 % injection 3 mL  3 mL Intravenous Q12H Lorretta HarpXilin Niu, MD   3 mL at 04/29/15 0218  . tiotropium (SPIRIVA) inhalation capsule 18 mcg  18 mcg Inhalation Daily Lorretta HarpXilin Niu, MD   18 mcg at 04/29/15 [id number]     Discharge Medications: Please see discharge summary for a list of discharge medications.  Relevant Imaging Results:  Relevant Lab Results:   Additional Information SS#: [id number]  Izora RibasHoloman, Mairlyn Tegtmeyer M, [place]

## 2015-04-29 NOTE — Evaluation (Addendum)
Occupational Therapy Evaluation Patient Details Name: Norman Davis MRN: [id number] DOB: 1940/07/09 Today's Date: 04/29/2015    History of Present Illness 74 y.o. male with PMH of hypertension, hyperlipidemia, COPD, depression, GERD, schizophrenia, CAD, MI, kidney stone, gallstone, BPH, who presented with left leg pain, chest pain and abdominal pain.   Clinical Impression   Pt admitted with above. Pt getting assist with ADLs, PTA. Feel pt will benefit from acute OT to increase independence prior to d/c. Recommending SNF at this time unless pt progresses.    Follow Up Recommendations  SNF    Equipment Recommendations  Other (comment) (defer to next venue)    Recommendations for Other Services       Precautions / Restrictions Precautions Precautions: Fall Restrictions Weight Bearing Restrictions: No      Mobility Bed Mobility Overal bed mobility: Needs Assistance Bed Mobility: Rolling;Sidelying to Sit Rolling: Supervision Sidelying to sit: Mod assist       General bed mobility comments: assist with trunk and positioning of LB.  Transfers Overall transfer level: Needs assistance Equipment used: Straight cane Transfers: Sit to/from Stand Sit to Stand: Min assist         General transfer comment: Min guard-Min assist for sit to stand. Pt ambulated short distance and turned to sit in chair    Balance    Min assist to transfer to chair-pt used cane. Decreased sitting balance on EOB-OT gave assist.                                        ADL Overall ADL's : Needs assistance/impaired                     Lower Body Dressing: Moderate assistance;Sit to/from stand   Toilet Transfer: Minimal assistance;Ambulation (cane; sit to stand from bed)           Functional mobility during ADLs: Minimal assistance;Cane       Vision     Perception     Praxis      Pertinent Vitals/Pain Pain Assessment: 0-10 Pain Score: 8  Pain Location:  lower back and legs Pain Intervention(s): Monitored during session;Repositioned     Hand Dominance     Extremity/Trunk Assessment Upper Extremity Assessment Upper Extremity Assessment:  (decreased AROM bilateral shoulder flexion; shoulder flexors-able to with withstand resistance with strength testing)    Lower Extremity Assessment Lower Extremity Assessment: Defer to PT evaluation       Communication Communication Communication: No difficulties   Cognition Arousal/Alertness: Awake/alert Behavior During Therapy: WFL for tasks assessed/performed Overall Cognitive Status: No family/caregiver present to determine baseline cognitive functioning (seemed to have slow processing and had decreased safety awareness)                     General Comments       Exercises       Shoulder Instructions      Home Living Family/patient expects to be discharged to:: Private residence (agreeable to SNF) Living Arrangements: Other relatives (sister) Available Help at Discharge: Family;Available 24 hours/day (sister uses a cane) Type of Home: House Home Access: Stairs to enter [employer] of Steps: 4 Entrance Stairs-Rails: None Home Layout: Two level;Bed/bath upstairs Alternate Level Stairs-Number of Steps: 16 Alternate Level Stairs-Rails: Right;Left;Can reach both Bathroom Shower/Tub: Tub/shower unit         Home Equipment: Environmental consultant - 2 wheels;Cane -  single point;Shower seat   Additional [facility]      Prior Functioning/Environment Level of Independence: Needs assistance  Gait / Transfers Assistance Needed: uses straight cane ADL's / Homemaking Assistance Needed: assist with bathing and dressing; [facility]        OT Diagnosis: Acute pain   OT Problem List: Decreased strength;Pain;Decreased knowledge of precautions;Decreased knowledge of use of DME or AE;Decreased safety  awareness;Decreased cognition;Impaired balance (sitting and/or standing);Decreased range of motion;Decreased activity tolerance   OT Treatment/Interventions: DME and/or AE instruction;Therapeutic activities;Cognitive remediation/compensation;Balance training;Patient/family education;Self-care/ADL training    OT Goals(Current goals can be found in the care plan section) Acute Rehab OT Goals Patient Stated Goal: get out of here OT Goal Formulation: With patient Time For Goal Achievement: 05/06/15 Potential to Achieve Goals: Good ADL Goals Pt Will Perform Lower Body Bathing: with adaptive equipment;sit to/from stand;with min guard assist Pt Will Perform Lower Body Dressing: sit to/from stand;with adaptive equipment;with min guard assist Pt Will Transfer to Toilet: with min guard assist;ambulating Pt Will Perform Toileting - Clothing Manipulation and hygiene: with min guard assist;sit to/from stand  OT Frequency: Min 2X/week   Barriers to D/C:            Co-evaluation              End of Session Equipment Utilized During Treatment: Gait belt (cane) Nurse Communication: Mobility status (notified tech and nurse to put chair alarm under pt.)  Activity Tolerance: Patient limited by pain Patient left: in chair;with call bell/phone within reach   Time: 1157-1214 OT Time Calculation (min): 17 min Charges:  OT General Charges $OT Visit: 1 Procedure OT Evaluation $Initial OT Evaluation Tier I: 1 Procedure G-CodesEarlie Raveling:    Aslyn Cottman L OTR/L [phone] 04/29/2015, 12:44 PM

## 2015-04-29 NOTE — Progress Notes (Signed)
Orthopedic Tech Progress Note Patient Details:  Norman Davis Oct 17, 1940 [id number]  Patient ID: Norman Davis, male   DOB: Oct 17, 1940, 74 y.o.   MRN: [id number] Completed by bio-tech vendor  Trinna PostMartinez, Kaiden Pech J 04/29/2015, 4:17 PM

## 2015-04-29 NOTE — Clinical Social Work Note (Signed)
Clinical Social Work Assessment  Patient Details  Name: Norman Davis MRN: [id number] Date of Birth: 07/17/1940  Date of referral:  04/29/15               Reason for consult:  Facility Placement                Permission sought to share information with:  Family Supports Permission granted to share information::  Yes, Verbal Permission Granted  Name::        [facility]  Relationship::     Contact Information:     Housing/Transportation Living arrangements for the past 2 months:  Single Family Home Source of Information:  Patient Patient Interpreter Needed:  None Criminal Activity/Legal Involvement Pertinent to Current Situation/Hospitalization:  No - Comment as needed Significant Relationships:  Siblings Lives with:  Siblings Do you feel safe going back to the place where you live?  No Need for family participation in patient care:  Yes (Comment)  Care giving concerns:  Pt lives at home with sister- per PT sister walks with cane and could not offer must assist   Social Worker assessment / plan: CSW spoke with pt concerning PT recommendation for SNF stay prior to return home.  Employment status:  Retired Health and safety inspectornsurance information:  Medicare PT Recommendations:  Skilled Nursing Facility Information / Referral to community resources:  Skilled Nursing Facility  Patient/Family's Response to care:  Pt is agreeable to SNF and states he has been in the past- pt recognizes that he is weaker than baseline and would like to increase mobility prior to returning home with sister.  Patient/Family's Understanding of and Emotional Response to Diagnosis, Current Treatment, and Prognosis:  No questions or concerns at this time.  Emotional Assessment Appearance:  Appears younger than stated age Attitude/Demeanor/Rapport:    Affect (typically observed):  Appropriate Orientation:  Oriented to Situation, Oriented to  Time, Oriented to Place, Oriented to Self Alcohol / Substance  use:  Not Applicable Psych involvement (Current and /or in the community):     Discharge Needs  Concerns to be addressed:  Care Coordination Readmission within the last 30 days:  No Current discharge risk:  Physical Impairment Barriers to Discharge:  Continued Medical Work up   Norman RibasHoloman, Lujuana Kapler Davis, Norman MtLCSW 04/29/2015, 6:39 PM

## 2015-04-29 NOTE — Consult Note (Signed)
NAMJoya San:  Davis, Norman                ACCOUNT NO.:  [id number]  MEDICAL RECORD NO.:  [id number]  LOCATION:  2W17C                        FACILITY:  [facility]  PHYSICIAN:  Hilda LiasErnesto Tresea Heine, M.D.   DATE OF BIRTH:  [birth year]  DATE OF CONSULTATION:  04/29/2015 DATE OF DISCHARGE:                                CONSULTATION   HISTORY OF PRESENT ILLNESS:  I was called to see Mr. Norman Davis because of history of back pain radiating to both legs.  I came twice and he was quite busy.  It was difficult to get detailed history from him, but it seemed that about 30 years ago, he underwent fusion in his lumbar spine followed by fusion to the pelvis.  For a long time, he is complaining of back pain but now the pain is going to both lower extremities associated with weakness and typical neurogenic claudication.  The patient had MRI and we were called for evaluation.  Clinically, the straight leg raising was positive at 60 degrees.  Reflexes 1+.  Sensation seemed to be normal.  He has weakness of dorsiflexion of both feet.  The MRI showed that he has a solid fusion but a bulge that is degenerative disk disease with spondylolisthesis about 4 mm.  We will have x-rays with flexion- extension but only the MRI.  Above that area at the level of 2-3 and 3-4 is also degenerative disk disease with stenosis.  CLINICAL IMPRESSION:  Status post fusion.  Fusion from the spine to the pelvis.  Progressive degenerative disk disease with spondylolisthesis being worse at L4-5.  RECOMMENDATION:  He lives with a sister.  The plan is for him that once he is stable, he can go home.  We can follow him in the office for a followup to see what will be the best way to take care of him.  One will be probably try to do a lumbar myelogram to see how many affected and what type of surgery he will require.  The other possibility is if there is a medical contraindication, we can refer him to the Pain Clinic.     ______________________________ Hilda LiasErnesto Sally-Anne Wamble, M.D.     EB/MEDQ  D:  04/29/2015  T:  04/29/2015  Job:  [id number]

## 2015-04-29 NOTE — Progress Notes (Signed)
Patient ID: Norman Davis, male   DOB: March 29, 1941, 74 y.o.   MRN: [id number] Came to see. Busy undergoing an ECHO

## 2015-04-29 NOTE — Evaluation (Signed)
Physical Therapy Evaluation Patient Details Name: Norman Davis MRN: [id number] DOB: Apr 21, 1941 Today's Date: 04/29/2015   History of Present Illness  74 y.o. male with PMH of hypertension, hyperlipidemia, COPD, depression, GERD, schizophrenia, CAD, MI, kidney stone, gallstone, BPH, who presented with left leg pain, chest pain and abdominal pain.  Clinical Impression  Pt with L LE pain causing instability with ambulation and transfers requiring assist. Pt with h/o falls due to L LE buckling.Recommend ST-SNF to achieve safe mod I for safe transition home.    Follow Up Recommendations SNF;Supervision/Assistance - 24 hour    Equipment Recommendations  Rolling walker with 5" wheels    Recommendations for Other Services       Precautions / Restrictions Precautions Precautions: Fall Restrictions Weight Bearing Restrictions: No      Mobility  Bed Mobility Overal bed mobility: Needs Assistance Bed Mobility: Rolling;Sit to Supine Rolling: Supervision Sidelying to sit: Mod assist   Sit to supine: Min assist   General bed mobility comments: assist for LE management into bed and to achieve midline position  Transfers Overall transfer level: Needs assistance Equipment used: Straight cane Transfers: Sit to/from Stand Sit to Stand: Min assist         General transfer comment: v/c's for safe hand placement and walker management  Ambulation/Gait Ambulation/Gait assistance: Min assist Ambulation Distance (Feet): 20 Feet Assistive device: Rolling walker (2 wheeled) Gait Pattern/deviations: Step-to pattern;Decreased stride length;Shuffle;Trunk flexed Gait velocity: decreased   General Gait Details: minA for safe walker management as pt pushed it out to far in front of self. Pt reports RW to provide more stability, PT agrees however unsafe to use independently due to decreased safety awareness  Stairs            Wheelchair Mobility    Modified Rankin (Stroke Patients  Only)       Balance Overall balance assessment: Needs assistance Sitting-balance support: Feet supported;No upper extremity supported Sitting balance-Leahy Scale: Fair     Standing balance support: Single extremity supported Standing balance-Leahy Scale: Fair Standing balance comment: pt okay statically but requires UE support for anything dynamic                             Pertinent Vitals/Pain Pain Assessment: 0-10 Pain Score: 8  Pain Location: left leg in groin area Pain Intervention(s): Monitored during session    Home Living Family/patient expects to be discharged to:: Private residence (agreeable to SNF) Living Arrangements: Other relatives (sister) Available Help at Discharge: Family;Available 24 hours/day (sister uses a cane) Type of Home: House Home Access: Stairs to enter Entrance Stairs-Rails: None Entrance Stairs-Number of Steps: 4 Home Layout: Two level;Bed/bath upstairs Home Equipment: Walker - 2 wheels;Cane - single point;Shower seat Additional [facility]    Prior Function Level of Independence: Needs assistance   Gait / Transfers Assistance Needed: uses straight cane  ADL's / Homemaking Assistance Needed: assist with bathing and dressing; [facility]        Hand Dominance        Extremity/Trunk Assessment   Upper Extremity Assessment: Defer to OT evaluation           Lower Extremity Assessment: Generalized weakness (due to pain)         Communication   Communication: No difficulties  Cognition Arousal/Alertness: Awake/alert Behavior During Therapy: WFL for tasks assessed/performed Overall Cognitive Status:  No family/caregiver present to determine baseline cognitive functioning (seemed to have slow processing)                      General Comments      Exercises        Assessment/Plan    PT Assessment Patient needs  continued PT services  PT Diagnosis Difficulty walking;Generalized weakness   PT Problem List Decreased strength;Decreased range of motion;Decreased activity tolerance;Decreased balance;Decreased mobility  PT Treatment Interventions DME instruction;Gait training;Stair training;Functional mobility training;Therapeutic activities;Therapeutic exercise   PT Goals (Current goals can be found in the Care Plan section) Acute Rehab PT Goals Patient Stated Goal: get out of here PT Goal Formulation: With patient Time For Goal Achievement: 05/06/15 Potential to Achieve Goals: Good    Frequency Min 2X/week   Barriers to discharge        Co-evaluation               End of Session Equipment Utilized During Treatment: Gait belt Activity Tolerance: Patient tolerated treatment well Patient left: in bed;with call bell/phone within reach;with bed alarm set Nurse Communication: Mobility status         Time: [id number] PT Time Calculation (min) (ACUTE ONLY): 17 min   Charges:   PT Evaluation $Initial PT Evaluation Tier I: 1 Procedure     PT G CodesMarcene Brawn:        [place] Marie 04/29/2015, 3:49 PM   Lewis ShockAshly Jarad Barth, PT, DPT Pager #: [phone] Office #: [phone]

## 2015-04-29 NOTE — Clinical Social Work Placement (Signed)
   CLINICAL SOCIAL WORK PLACEMENT  NOTE  Date:  04/29/2015  Patient Details  Name: Joya Sanmmette Krukowski MRN: [id number] Date of Birth: 06-24-1940  Clinical Social Work is seeking post-discharge placement for this patient at the Skilled  Nursing Facility level of care (*CSW will initial, date and re-position this form in  chart as items are completed):  Yes   Patient/family provided with [place] Clinical Social Work Department's list of facilities offering this level of care within the geographic area requested by the patient (or if unable, by the patient's family).  Yes   Patient/family informed of their freedom to choose among providers that offer the needed level of care, that participate in Medicare, Medicaid or managed care program needed by the patient, have an available bed and are willing to accept the patient.  Yes   Patient/family informed of [facility] ownership interest in [facility] and [facility], as well as of the fact that they are under no obligation to receive care at these facilities.  PASRR submitted to EDS on       PASRR number received on       Existing PASRR number confirmed on       FL2 transmitted to all facilities in geographic area requested by pt/family on 04/29/15     FL2 transmitted to all facilities within larger geographic area on       Patient informed that his/her managed care company has contracts with or will negotiate with certain facilities, including the following:            Patient/family informed of bed offers received.  Patient chooses bed at       Physician recommends and patient chooses bed at      Patient to be transferred to   on  .  Patient to be transferred to facility by       Patient family notified on   of transfer.  Name of family member notified:        PHYSICIAN Please sign FL2     Additional Comment:    _______________________________________________ Izora RibasHoloman, Earsie Humm M, LCSW 04/29/2015, 6:41 PM

## 2015-04-29 NOTE — Progress Notes (Signed)
NP Schorr notified of patient's ddimer results.  New orders given.  Will continue to monitor patient closely.  Erenest Rasheraldwell,Tailer Volkert B, RN

## 2015-04-29 NOTE — Progress Notes (Signed)
Orthopedic Tech Progress Note Patient Details:  Norman Davis 11-09-1940 [id number] Called bio-tech for brace order. Patient ID: Norman Davis, male   DOB: 11-09-1940, 74 y.o.   MRN: [id number]   Norman Davis, Norman Davis 04/29/2015, 3:47 PM

## 2015-04-29 NOTE — Progress Notes (Signed)
PT Cancellation Note  Patient Details Name: Norman Davis MRN: [id number] DOB: 1941-03-15   Cancelled Treatment:    Reason Eval/Treat Not Completed: Patient at procedure or test/unavailable. Pt undergoing beside ECHO. PT to return as able.   Marcene BrawnChadwell, Jakie Debow Marie 04/29/2015, 10:49 AM  Lewis ShockAshly Davison Ohms, PT, DPT Pager #: [phone] Office #: [phone]

## 2015-04-29 NOTE — Progress Notes (Signed)
Patient ID: Norman Davis, male   DOB: 1940/08/22, 74 y.o.   MRN: [id number] Seen along with the mri. Note dictated.patient lives with her sister. We can see him in my office once he is stable. The choices will be surgery after we do a lumbar myelogram or refer him to a pain clinic. Will ger a lumbar brace

## 2015-04-29 NOTE — Progress Notes (Signed)
PROGRESS NOTE  Norman Davis WUJ:[id number] DOB: Dec 03, 1940 DOA: 04/28/2015 PCP: Bufford Spikes, DO  Assessment/Plan: Chest pain and NSTEMI:  - start IV heparin Cards consult -echo done - cycle CE  - Nitroglycerin, Morphine, pravastatin, plavix - Patient is allergic to aspirin - Start low-dose Coreg 3.125 mg twice a day  Abdominal pain: Etiology is unclear. Lipase is negative. -CT abdomen/pelvis negative -? Referred from back?  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 -Continue home medications: Prozac, Remeron  GERD: -Protonix   PT rec SNF   Code Status: full Family Communication: patient Disposition Plan:    Consultants:  Cards  NS  Procedures:      HPI/Subjective: Chest pain cleared, back pain still there  Objective: Filed Vitals:   04/28/15 2309 04/29/15 0812  BP: 127/86 143/87  Pulse: 67 76  Temp: 97.6 F (36.4 C)   Resp: 18    No intake or output data in the 24 hours ending 04/29/15 1435 Filed Weights   04/28/15 2220 04/28/15 2309  Weight: 89.812 kg (198 lb) 82.7 kg (182 lb 5.1 oz)    Exam:   General:  Awake, NAD  Cardiovascular: rrr  Respiratory: clear  Abdomen: +BS, soft  Musculoskeletal: no edema,. Left leg weaker than right  Data Reviewed: Basic Metabolic Panel:  Recent Labs Lab 04/28/15 1838 04/29/15 0419  NA 146* 145  K 3.8 3.2*  CL 115* 112*  CO2 23 24  GLUCOSE 108* 94  BUN 17 14  CREATININE 1.25* 1.04  CALCIUM 9.2 8.7*   Liver  Function Tests:  Recent Labs Lab 04/29/15 0419  AST 19  ALT 11*  ALKPHOS 110  BILITOT 0.7  PROT 5.9*  ALBUMIN 3.0*    Recent Labs Lab 04/28/15 2355  LIPASE 22   No results for input(s): AMMONIA in the last 168 hours. CBC:  Recent Labs Lab 04/28/15 1838 04/29/15 0419  WBC 6.4 5.3  NEUTROABS 4.4  --   HGB 13.0 11.7*  HCT 40.5 35.9*  MCV 88.8 88.9  PLT 166 143*   Cardiac Enzymes:  Recent Labs Lab 04/28/15 1838 04/28/15 2355 04/29/15 0440 04/29/15 1111  TROPONINI 0.11* 0.13* 0.13* 0.11*   BNP (last 3 results)  Recent Labs  04/28/15 1838  BNP 23.1    ProBNP (last 3 results) No results for input(s): PROBNP in the last 8760 hours.  CBG: No results for input(s): GLUCAP in the last 168 hours.  No results found for this or any previous visit (from the past 240 hour(s)).   Studies: Ct Abdomen Pelvis Wo Contrast  04/29/2015  CLINICAL DATA:  Acute onset of shortness of breath, bilateral leg pain and weakness. Initial encounter. EXAM: CT ABDOMEN AND PELVIS WITHOUT CONTRAST TECHNIQUE: Multidetector CT imaging of the abdomen and pelvis was performed following the standard protocol without IV contrast. COMPARISON:  CT of the abdomen and pelvis from 03/16/2013, and MRI of the lumbar spine performed 04/28/2015 FINDINGS: The visualized lung bases are clear. The liver and spleen are unremarkable in appearance. Stones are seen layering dependently within the gallbladder. The gallbladder is  otherwise unremarkable. The pancreas and adrenal glands are unremarkable. The kidneys are unremarkable in appearance. There is no evidence of hydronephrosis. No renal or ureteral stones are seen. Mild nonspecific perinephric stranding is noted bilaterally. No free fluid is identified. The small bowel is unremarkable in appearance. The stomach is within normal limits. No acute vascular abnormalities are seen. Minimal calcification is noted along the abdominal aorta. An IVC filter is noted. The  appendix is difficult to fully assess but appears grossly unremarkable, without evidence of appendicitis. Contrast progresses to the level of the transverse colon. The colon is unremarkable in appearance. The bladder is mildly distended and grossly unremarkable. The prostate remains normal in size. No inguinal lymphadenopathy is seen. No acute osseous abnormalities are identified. Diffuse sclerotic change is noted within the pelvis. Postoperative change is noted at the left iliac bone. The patient is status post lumbar spinal fusion at L5-S1. An apparent large Schmorl's node is noted at the inferior endplate of L3, as noted on prior MRI. IMPRESSION: 1. No acute abnormality seen within the abdomen or pelvis. 2. Cholelithiasis.  Gallbladder otherwise unremarkable. Electronically Signed   By: Roanna Raider M.D.   On: 04/29/2015 02:43   Dg Chest 2 View  04/28/2015  CLINICAL DATA:  Shortness of Breath EXAM: CHEST - 2 VIEW COMPARISON:  03/16/2013 FINDINGS: Cardiac shadow is within normal limits. The thoracic aorta is mildly tortuous. Emphysematous changes are noted with bullous formation particularly on the right. No focal infiltrate or sizable effusion is seen. No bony abnormality is noted. IMPRESSION: Emphysematous changes without acute abnormality. Electronically Signed   By: Alcide Clever M.D.   On: 04/28/2015 18:54   Mr Lumbar Spine Wo Contrast  04/28/2015  CLINICAL DATA:  Pain in both legs with weakness since December 21st. Some shortness of breath since that time. EXAM: MRI LUMBAR SPINE WITHOUT CONTRAST TECHNIQUE: Multiplanar, multisequence MR imaging of the lumbar spine was performed. No intravenous contrast was administered. COMPARISON:  CT abdomen and pelvis 03/16/2013. No prior lumbar cross-sectional imaging available. FINDINGS: Segmentation: 5 lumbar type vertebral bodies are assumed. Alignment: The patient has undergone posterior and interbody fusion at L5-S1. Within limits for assessment on MR, the  fusion appears solid. There is 4 mm of degenerative anterolisthesis at L4-5. Vertebrae: No worrisome osseous lesion.Fatty replaced marrow above and below L4-5 reflect chronic degenerative disc disease. Acute bone marrow edema accompanies a Schmorl's node projecting superiorly from L3-4. Multiple screws related to LEFT sacroiliac joint fusion. Conus medullaris: Normal in size, signal, and location. Serpiginous nerve roots are indicative of severe stenosis. Paraspinal tissues: No evidence for hydronephrosis or paravertebral mass. Disc levels: L1-L2:  Mild bulge.  Mild facet arthropathy.  No impingement. L2-L3: Central and leftward protrusion extends to the foramen and beyond. BILATERAL facet arthropathy. Moderate stenosis. LEFT greater than RIGHT subarticular zone and foraminal zone narrowing affecting the L2 and L3 nerve roots. L3-L4: No disc protrusion centrally, but annular bulging and osteophytic ridging in conjunction with severe facet arthropathy and ligamentum flavum hypertrophy contribute to subarticular zone and foraminal zone narrowing. There is moderate to severe stenosis. BILATERAL L3 and L4 nerve root impingement are likely. L4-L5: 4 mm anterolisthesis. Severe disc space narrowing with osseous ridging. Mild annular bulging centrally. Severe facet arthropathy. BILATERAL subarticular zone and foraminal zone narrowing affect the L4 and L5 nerve roots. L5-S1:  No residual impingement. 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 BILATERAL L4 and L5  nerve root impingement are noted. Please note that these changes could be worse with patient standing particularly in flexion, if there is dynamic instability. Moderate to severe stenosis at L3-4, related to annular bulging and osseous ridging in conjunction with posterior element hypertrophy. Moderate stenosis at L2-3 related to central and leftward protrusion  as well as posterior element hypertrophy. Solid appearing instrumented posterior and interbody fusion L5-S1. Electronically Signed   By: Elsie StainJohn T Curnes M.D.   On: 04/28/2015 20:50    Scheduled Meds: . amLODipine  10 mg Oral Daily  . brimonidine  1 drop Both Eyes BID  . carvedilol  3.125 mg Oral BID WC  . clopidogrel  75 mg Oral Q breakfast  . colchicine  0.6 mg Oral Daily  . finasteride  5 mg Oral Daily  . FLUoxetine  20 mg Oral Daily  . ibuprofen  600 mg Oral TID WC  . loratadine  10 mg Oral Daily  . mirabegron ER  25 mg Oral Daily  . mirtazapine  30 mg Oral QHS  . pantoprazole  40 mg Oral Daily  . pravastatin  40 mg Oral q1800  . sodium chloride  3 mL Intravenous Q12H  . tiotropium  18 mcg Inhalation Daily   Continuous Infusions: . sodium chloride 75 mL/hr at 04/29/15 0203  . heparin 1,100 Units/hr (04/29/15 0209)   Antibiotics Given (last 72 hours)    None      Principal Problem:   Chest pain Active Problems:   AKI (acute kidney injury) (HCC)   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)   Hypercholesteremia   Hypertension   COPD (chronic obstructive pulmonary disease) (HCC)   Leg pain, left   NSTEMI (non-ST elevated myocardial infarction) (HCC)   Abdominal pain    Time spent: 25 min    Lala Been [facility]  [facility] Pager [phone]. If 7PM-7AM, please contact night-coverage at [email or web address], password [email or web address] 04/29/2015, 2:35 PM  LOS: 1 day

## 2015-04-29 NOTE — Progress Notes (Signed)
ANTICOAGULATION CONSULT NOTE - Follow Up Consult  Pharmacy Consult for heparin Indication: chest pain/ACS  Allergies  Allergen Reactions  . Aspirin     unknown  . Penicillins     Unknown Has patient had a PCN reaction causing immediate rash, facial/tongue/throat swelling, SOB or lightheadedness with hypotension: NO Has patient had a PCN reaction causing severe rash involving mucus membranes or skin necrosis: NO Has patient had a PCN reaction that required hospitalization NO Has patient had a PCN reaction occurring within the last 10 years: NO If all of the above answers are "NO", then may proceed with Cephalosporin use.    Patient Measurements: Height: 5' 8.9" (175 cm) Weight: 182 lb 5.1 oz (82.7 kg) IBW/kg (Calculated) : 70.47  Vital Signs: Temp: 97.6 F (36.4 C) (12/27 2309) Temp Source: Oral (12/27 2309) BP: 143/87 mmHg (12/28 0812) Pulse Rate: 76 (12/28 0812)  Labs:  Recent Labs  04/28/15 1838 04/28/15 2355 04/29/15 0419 04/29/15 0440 04/29/15 0728  HGB 13.0  --  11.7*  --   --   HCT 40.5  --  35.9*  --   --   PLT 166  --  143*  --   --   APTT  --  26  --   --   --   LABPROT  --  15.6*  --   --   --   INR  --  1.23  --   --   --   HEPARINUNFRC  --   --   --   --  0.65  CREATININE 1.25*  --  1.04  --   --   TROPONINI 0.11* 0.13*  --  0.13*  --     Estimated Creatinine Clearance: 60.3 mL/min (by C-G formula based on Cr of 1.04).  Assessment: 74 yo M on heparin for chest tightness, r/o ACS. Troponin 0.11,0.13, 0.13.  EKG neg for STEMI.  His heparin level is therapeutic at 0.65 after a 4000 unit bolus and drip at 1100 units/hr.  Hg 13>11.7, PLTC [id number]>143.  Goal of Therapy:  Heparin level 0.3-0.7 units/ml Monitor platelets by anticoagulation protocol: Yes   Plan:  - continue heparin 1100 units/hr - daily HL/CBC  Herby AbrahamMichelle T. Hendrick Pavich, Pharm.D. [phone] 04/29/2015 10:55 AM

## 2015-04-29 NOTE — Progress Notes (Signed)
  Echocardiogram 2D Echocardiogram has been performed.  Norman Davis, Norman Davis 04/29/2015, 11:47 AM

## 2015-04-29 NOTE — Consult Note (Signed)
CARDIOLOGY CONSULT NOTE   Patient ID: Norman Davis MRN: [id number] DOB/AGE: May 16, 1940 73 y.o.  Admit date: 04/28/2015  Primary Physician   REED, Elmarie Shiley, DO Primary Cardiologist   New  Reason for Consultation   Chest pain and positive troponin   HPI: Norman Davis is a 74 y.o. male with a history of HLD, HTN, GERD, and schizoaffective disorder who presented to [facility] on 04/28/15 with chest pain, left leg pain and abdominal pain.   He presented with the above complaints since Wednesday. He used to live in [place] but has lived with his sister in [place] for the last couple years. Per his sister, he has a history of a "heart attack" but she doesn't think he has ever had a heart catheterization. (possibly from ECG). 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   Patient reports having lower leg and abdominal pain and nausea which have been going on for about one week. No diarrhea or vomiting. His abdominal pain is diffuse, constant, 8 out of 10 in severity.   Patient reports that he has been having chest pain for about one week. It is located in the substernal area, intermittent, 8/10 inseverity, nonradiating. It is a sharp pain that is worse with deep inspiration. Not worse when lying flat. Not exertional, but patient is not very active. He currently has no chest pain but he said it would hurt if he were to take a deep breath. History is limited as patient is not a good historian ( question psych disorder vs mild mental retardation). Of note, he is allergic to ASA.    Past Medical History  Diagnosis Date  . Schizophrenia (HCC)   . Hypercholesteremia   . Hypertension   . H/O urinary retention   . Myocardial infarction (HCC)   . GERD (gastroesophageal reflux disease)   . Small bowel obstruction due to adhesions (HCC)   . 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 Nephrolithiasis     right  . Cholelithiasis       Past Surgical History  Procedure Laterality Date  . Total hip arthroplasty    . Pelvic fracture surgery    . Liver surgery      Allergies  Allergen Reactions  . Aspirin     unknown  . Penicillins     Unknown Has patient had a PCN reaction causing immediate rash, facial/tongue/throat swelling, SOB or lightheadedness with hypotension: NO Has patient had a PCN reaction causing severe rash involving mucus membranes or skin necrosis: NO Has patient had a PCN reaction that required hospitalization NO Has patient had a PCN reaction occurring within the last 10 years: NO If all of the above answers are "NO", then may proceed with Cephalosporin use.    I have reviewed the patient's current medications . amLODipine  10 mg Oral Daily  . brimonidine  1 drop Both Eyes BID  . carvedilol  3.125 mg Oral BID WC  . clopidogrel  75 mg Oral Q breakfast  . finasteride  5 mg Oral Daily  . FLUoxetine  20 mg Oral Daily  . loratadine  10 mg Oral Daily  . mirabegron ER  25 mg Oral Daily  . mirtazapine  30 mg Oral QHS  . pantoprazole  40 mg Oral Daily  . pravastatin  40 mg Oral q1800  . sodium chloride  3 mL Intravenous Q12H  . tiotropium  18 mcg Inhalation Daily   . sodium chloride 75 mL/hr at 04/29/15 0203  . heparin 1,100 Units/hr (04/29/15 0209)   albuterol, hydrOXYzine, morphine injection, nitroGLYCERIN, oxyCODONE-acetaminophen  Prior to Admission medications   Medication Sig Start Date End Date Taking? Authorizing Provider  albuterol (PROVENTIL HFA;VENTOLIN HFA) 108 (90 BASE) MCG/ACT inhaler Inhale 2 puffs into the lungs every 4 (four) hours as needed for wheezing or shortness of breath.   Yes Historical Provider, MD  amLODipine (NORVASC) 10 MG tablet Take 10 mg by mouth daily.   Yes Historical Provider, MD  brimonidine (ALPHAGAN) 0.2 % ophthalmic solution Place 1 drop into both eyes 2 (two) times daily.   Yes Historical Provider, MD  cetirizine (ZYRTEC) 10 MG tablet Take 10 mg by mouth  daily.   Yes Historical Provider, MD  clopidogrel (PLAVIX) 75 MG tablet Take 75 mg by mouth daily with breakfast.   Yes Historical Provider, MD  finasteride (PROSCAR) 5 MG tablet Take 5 mg by mouth daily.   Yes Historical Provider, MD  FLUoxetine (PROZAC) 20 MG capsule Take 20 mg by mouth daily.   Yes Historical Provider, MD  meloxicam (MOBIC) 7.5 MG tablet Take 7.5 mg by mouth daily.   Yes Historical Provider, MD  Mirabegron (MYRBETRIQ PO) Take 1 tablet by mouth daily.   Yes Historical Provider, MD  mirtazapine (REMERON) 30 MG tablet Take 30 mg by mouth at bedtime.   Yes Historical Provider, MD  pantoprazole (PROTONIX) 40 MG tablet Take 40 mg by mouth daily.   Yes Historical Provider, MD  pravastatin (PRAVACHOL) 40 MG tablet Take 40 mg by mouth daily.   Yes Historical Provider, MD  tiotropium (SPIRIVA) 18 MCG inhalation capsule Place 18 mcg into inhaler and inhale daily.   Yes Historical Provider, MD  bisacodyl (DULCOLAX) 10 MG suppository Place 1 suppository (10 mg total) rectally daily as needed for moderate constipation. Patient not taking: Reported on 04/28/2015 03/23/13   Esperanza Sheets, MD     Social History   Social History  . Marital Status: Divorced    Spouse Name: N/A  . Number of Children: N/A  . Years of Education: N/A   Occupational History  . Not on file.   Social History Main Topics  . Smoking status: Former Smoker -- 1.00 packs/day    Types: Cigarettes  . Smokeless tobacco: Never Used  . Alcohol Use: No  . Drug Use: No  . Sexual Activity: No   Other Topics Concern  . Not on file   Social History Narrative    Family Status  Relation Status Death Age  . Mother Deceased   . Father Deceased    Family History  Problem Relation Age of Onset  . Cancer Mother   . Cancer Father      ROS:  Full 14 point review of systems complete and found to be negative unless listed above.  Physical Exam: Blood pressure 143/87, pulse 76, temperature 97.6 F (36.4 C),  temperature source Oral, resp. rate 18, height 5' 8.9" (1.75 m), weight 182 lb 5.1 oz (82.7 kg), SpO2 97 %.  General: Well developed, well nourished, male in no acute distress Head: Eyes PERRLA, No xanthomas.   Normocephalic and atraumatic, oropharynx without edema or exudate.  Lungs: CTAB Heart: HRRR S1 S2, no rub/gallop, Heart regular rate and rhythm with S1, S2  murmur. pulses are 2+ extrem.  Tender to palpation Neck: No carotid bruits. No lymphadenopathy. No JVD. Abdomen: Bowel sounds present, abdomen soft and non-tender  without masses or hernias noted. Msk:  No spine or cva tenderness. No weakness, no joint deformities or effusions. Extremities: No clubbing or cyanosis.  No edema.  Neuro: Alert and oriented X 3. No focal deficits noted. Psych:  Good affect, responds appropriately Skin: No rashes or lesions noted.  Labs:   Lab Results  Component Value Date   WBC 5.3 04/29/2015   HGB 11.7* 04/29/2015   HCT 35.9* 04/29/2015   MCV 88.9 04/29/2015   PLT 143* 04/29/2015    Recent Labs  04/28/15 2355  INR 1.23    Recent Labs Lab 04/29/15 0419  NA 145  K 3.2*  CL 112*  CO2 24  BUN 14  CREATININE 1.04  CALCIUM 8.7*  PROT 5.9*  BILITOT 0.7  ALKPHOS 110  ALT 11*  AST 19  GLUCOSE 94  ALBUMIN 3.0*   MAGNESIUM  Date Value Ref Range Status  03/18/2013 2.8* 1.5 - 2.5 mg/dL Final    Recent Labs  16/10/96 1838 04/28/15 2355 04/29/15 0440  TROPONINI 0.11* 0.13* 0.13*   No results for input(s): TROPIPOC in the last 72 hours. No results found for: PROBNP Lab Results  Component Value Date   CHOL 122 04/29/2015   HDL 46 04/29/2015   LDLCALC 70 04/29/2015   TRIG 31 04/29/2015   No results found for: DDIMER LIPASE  Date/Time Value Ref Range Status  04/28/2015 11:55 PM 22 11 - 51 U/L Final    Echo: 04/29/2015 LV EF: 50 - 55% Study Conclusions - Left ventricle: Abnormal septal motion The cavity size was normal. Wall thickness was increased in a pattern of  mild LVH. Systolic function was normal. The estimated ejection fraction was in the range of 50% to 55%. - Mitral valve: Calcified annulus. Mildly thickened leaflets . - Atrial septum: No defect or patent foramen ovale was identified.  ECG:  HR 69. Sinus rhythm RBBB and LAFB  Radiology:  Ct Abdomen Pelvis Wo Contrast  04/29/2015  CLINICAL DATA:  Acute onset of shortness of breath, bilateral leg pain and weakness. Initial encounter. EXAM: CT ABDOMEN AND PELVIS WITHOUT CONTRAST TECHNIQUE: Multidetector CT imaging of the abdomen and pelvis was performed following the standard protocol without IV contrast. COMPARISON:  CT of the abdomen and pelvis from 03/16/2013, and MRI of the lumbar spine performed 04/28/2015 FINDINGS: The visualized lung bases are clear. The liver and spleen are unremarkable in appearance. Stones are seen layering dependently within the gallbladder. The gallbladder is otherwise unremarkable. The pancreas and adrenal glands are unremarkable. The kidneys are unremarkable in appearance. There is no evidence of hydronephrosis. No renal or ureteral stones are seen. Mild nonspecific perinephric stranding is noted bilaterally. No free fluid is identified. The small bowel is unremarkable in appearance. The stomach is within normal limits. No acute vascular abnormalities are seen. Minimal calcification is noted along the abdominal aorta. An IVC filter is noted. The appendix is difficult to fully assess but appears grossly unremarkable, without evidence of appendicitis. Contrast progresses to the level of the transverse colon. The colon is unremarkable in appearance. The bladder is mildly distended and grossly unremarkable. The prostate remains normal in size. No inguinal lymphadenopathy is seen. No acute osseous abnormalities are identified. Diffuse sclerotic change is noted within the pelvis. Postoperative change is noted at the left iliac bone. The patient is status post lumbar spinal fusion  at L5-S1. An apparent large Schmorl's node is noted at the inferior endplate of L3, as noted on prior MRI. IMPRESSION: 1. No acute  abnormality seen within the abdomen or pelvis. 2. Cholelithiasis.  Gallbladder otherwise unremarkable. Electronically Signed   By: Roanna Raider M.D.   On: 04/29/2015 02:43   Dg Chest 2 View  04/28/2015  CLINICAL DATA:  Shortness of Breath EXAM: CHEST - 2 VIEW COMPARISON:  03/16/2013 FINDINGS: Cardiac shadow is within normal limits. The thoracic aorta is mildly tortuous. Emphysematous changes are noted with bullous formation particularly on the right. No focal infiltrate or sizable effusion is seen. No bony abnormality is noted. IMPRESSION: Emphysematous changes without acute abnormality. Electronically Signed   By: Alcide Clever M.D.   On: 04/28/2015 18:54   Mr Lumbar Spine Wo Contrast  04/28/2015  CLINICAL DATA:  Pain in both legs with weakness since December 21st. Some shortness of breath since that time. EXAM: MRI LUMBAR SPINE WITHOUT CONTRAST TECHNIQUE: Multiplanar, multisequence MR imaging of the lumbar spine was performed. No intravenous contrast was administered. COMPARISON:  CT abdomen and pelvis 03/16/2013. No prior lumbar cross-sectional imaging available. FINDINGS: Segmentation: 5 lumbar type vertebral bodies are assumed. Alignment: The patient has undergone posterior and interbody fusion at L5-S1. Within limits for assessment on MR, the fusion appears solid. There is 4 mm of degenerative anterolisthesis at L4-5. Vertebrae: No worrisome osseous lesion.Fatty replaced marrow above and below L4-5 reflect chronic degenerative disc disease. Acute bone marrow edema accompanies a Schmorl's node projecting superiorly from L3-4. Multiple screws related to LEFT sacroiliac joint fusion. Conus medullaris: Normal in size, signal, and location. Serpiginous nerve roots are indicative of severe stenosis. Paraspinal tissues: No evidence for hydronephrosis or paravertebral mass. Disc  levels: L1-L2:  Mild bulge.  Mild facet arthropathy.  No impingement. L2-L3: Central and leftward protrusion extends to the foramen and beyond. BILATERAL facet arthropathy. Moderate stenosis. LEFT greater than RIGHT subarticular zone and foraminal zone narrowing affecting the L2 and L3 nerve roots. L3-L4: No disc protrusion centrally, but annular bulging and osteophytic ridging in conjunction with severe facet arthropathy and ligamentum flavum hypertrophy contribute to subarticular zone and foraminal zone narrowing. There is moderate to severe stenosis. BILATERAL L3 and L4 nerve root impingement are likely. L4-L5: 4 mm anterolisthesis. Severe disc space narrowing with osseous ridging. Mild annular bulging centrally. Severe facet arthropathy. BILATERAL subarticular zone and foraminal zone narrowing affect the L4 and L5 nerve roots. L5-S1:  No residual impingement. 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 BILATERAL L4 and L5 nerve root impingement are noted. Please note that these changes could be worse with patient standing particularly in flexion, if there is dynamic instability. Moderate to severe stenosis at L3-4, related to annular bulging and osseous ridging in conjunction with posterior element hypertrophy. Moderate stenosis at L2-3 related to central and leftward protrusion as well as posterior element hypertrophy. Solid appearing instrumented posterior and interbody fusion L5-S1. Electronically Signed   By: Elsie Stain M.D.   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 male with a history of HLD, HTN, GERD, and schizoaffective disorder who presented to [facility] on 04/28/15 with chest pain, left leg pain and abdominal pain.  Chest pain/Elevated troponin: troponin 0.11--> 0.13--> 0.13--> 0.11. Flat.  -- chest pain is atypical. ECG with no acute ST or TW changes. Will check a Ddimer as chest pain is worse with a deep breath and PE can cause mildly elevated troponin. 2D ECHO reassuring with normal LV function with no RWMAs. LE dopplers negative for DVT.  -- Will also empirically treat for pericarditis with colchicine and Ibuprofen. If his pain does not improve, will plan for myoview. Continue IV heparin for now.   SignedAllena Katz: THOMPSON, KATHRYN R, PA-C 04/29/2015 12:34 PM  Pager [phone]  Co-Sign MD  Attending Note:   The patient was seen and examined.  Agree with assessment and plan as noted above.  Changes made to the above note as needed.  His CP is very atypical for ACS He has chest wall tenderness and pleuretic CP. Will try motrin and colchicine  Will check d-dimer     Vesta MixerPhilip J. Nahser, Montez HagemanJr., MD, Mercy HospitalFACC 04/29/2015, 3:00 PM [address],  [address] Office [phone] Pager [phone]

## 2015-04-30 DIAGNOSIS — J439 Emphysema, unspecified: Secondary | ICD-10-CM

## 2015-04-30 DIAGNOSIS — R791 Abnormal coagulation profile: Secondary | ICD-10-CM

## 2015-04-30 DIAGNOSIS — R7989 Other specified abnormal findings of blood chemistry: Secondary | ICD-10-CM | POA: Insufficient documentation

## 2015-04-30 LAB — CBC
HEMATOCRIT: 36.3 % — AB (ref 39.0–52.0)
Hemoglobin: 11.5 g/dL — ABNORMAL LOW (ref 13.0–17.0)
MCH: 28 pg (ref 26.0–34.0)
MCHC: 31.7 g/dL (ref 30.0–36.0)
MCV: 88.5 fL (ref 78.0–100.0)
PLATELETS: 143 10*3/uL — AB (ref 150–400)
RBC: 4.1 MIL/uL — AB (ref 4.22–5.81)
RDW: 15 % (ref 11.5–15.5)
WBC: 5.1 10*3/uL (ref 4.0–10.5)

## 2015-04-30 LAB — BASIC METABOLIC PANEL
ANION GAP: 6 (ref 5–15)
BUN: 11 mg/dL (ref 6–20)
CALCIUM: 8.7 mg/dL — AB (ref 8.9–10.3)
CHLORIDE: 110 mmol/L (ref 101–111)
CO2: 25 mmol/L (ref 22–32)
Creatinine, Ser: 0.99 mg/dL (ref 0.61–1.24)
GFR calc Af Amer: >60 mL/min (ref >60)
GFR calc non Af Amer: >60 mL/min (ref >60)
GLUCOSE: 101 mg/dL — AB (ref 65–99)
Potassium: 3.7 mmol/L (ref 3.5–5.1)
Sodium: 141 mmol/L (ref 135–145)

## 2015-04-30 LAB — HEPARIN LEVEL (UNFRACTIONATED): Heparin Unfractionated: 0.65 IU/mL (ref 0.30–0.70)

## 2015-04-30 LAB — HEMOGLOBIN A1C
HEMOGLOBIN A1C: 5.8 % — AB (ref 4.8–5.6)
Mean Plasma Glucose: 120 mg/dL

## 2015-04-30 MED ORDER — PANTOPRAZOLE SODIUM 40 MG PO TBEC
40.0000 mg | DELAYED_RELEASE_TABLET | Freq: Two times a day (BID) | ORAL | Status: DC
  Administered 2015-04-30 – 2015-05-01 (×2): 40 mg via ORAL
  Filled 2015-04-30 (×2): qty 1

## 2015-04-30 NOTE — Progress Notes (Signed)
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 Denies CP. However it still hurts with a deep breath in. Tender to palpation  CURRENT MEDS . amLODipine  10 mg Oral Daily  . brimonidine  1 drop Both Eyes BID  . carvedilol  3.125 mg Oral BID WC  . clopidogrel  75 mg Oral Q breakfast  . colchicine  0.6 mg Oral Daily  . finasteride  5 mg Oral Daily  . FLUoxetine  20 mg Oral Daily  . ibuprofen  600 mg Oral TID WC  . loratadine  10 mg Oral Daily  . mirabegron ER  25 mg Oral Daily  . mirtazapine  30 mg Oral QHS  . pantoprazole  40 mg Oral Daily  . pravastatin  40 mg Oral q1800  . sodium chloride  3 mL Intravenous Q12H  . tiotropium  18 mcg Inhalation Daily    OBJECTIVE  Filed Vitals:   04/29/15 0812 04/29/15 1439 04/29/15 2100 04/30/15 0517  BP: 143/87 132/82 136/88 153/90  Pulse: 76 86 79 65  Temp:  97.6 F (36.4 C) 98.2 F (36.8 C) 98 F (36.7 C)  TempSrc:  Axillary Oral Oral  Resp:  17 18 18   Height:      Weight:      SpO2:  98% 93% 94%    Intake/Output Summary (Last 24 hours) at 04/30/15 1216 Last data filed at 04/29/15 1759  Gross per 24 hour  Intake    240 ml  Output    350 ml  Net   -110 ml   Filed Weights   04/28/15 2220 04/28/15 2309  Weight: 198 lb (89.812 kg) 182 lb 5.1 oz (82.7 kg)    PHYSICAL EXAM General: Well developed, well nourished, male in no acute distress Head: Eyes PERRLA, No xanthomas. Normocephalic and atraumatic, oropharynx without edema or exudate.  Lungs: CTAB Heart: HRRR S1 S2, no rub/gallop, Heart regular rate and rhythm with S1, S2 murmur. pulses are 2+ extrem. Tender to  palpation Neck: No carotid bruits. No lymphadenopathy. No JVD. Abdomen: Bowel sounds present, abdomen soft and non-tender without masses or hernias noted. Msk: No spine or cva tenderness. No weakness, no joint deformities or effusions. Extremities: No clubbing or cyanosis. No edema.  Neuro: Alert and oriented X 3. No focal deficits noted. Psych: Good affect, responds appropriately Skin: No rashes or lesions noted.  Accessory Clinical Findings  CBC  Recent Labs  04/28/15 1838 04/29/15 0419 04/30/15 0310  WBC 6.4 5.3 5.1  NEUTROABS 4.4  --   --   HGB 13.0 11.7* 11.5*  HCT 40.5 35.9* 36.3*  MCV 88.8 88.9 88.5  PLT 166 143* 143*   Basic Metabolic Panel  Recent Labs  04/29/15 0419 04/30/15 0310  NA 145 141  K 3.2* 3.7  CL 112* 110  CO2 24 25  GLUCOSE 94 101*  BUN 14 11  CREATININE 1.04 0.99  CALCIUM 8.7* 8.7*   Liver Function Tests  Recent Labs  04/29/15 0419  AST 19  ALT 11*  ALKPHOS 110  BILITOT 0.7  PROT 5.9*  ALBUMIN 3.0*    Recent Labs  04/28/15 2355  LIPASE 22   Cardiac Enzymes  Recent Labs  04/28/15 2355 04/29/15 0440 04/29/15 1111  TROPONINI 0.13* 0.13* 0.11*   BNP Invalid input(s): POCBNP D-Dimer  Recent Labs  04/29/15 1412  DDIMER 0.93*   Hemoglobin A1C  Recent Labs  04/29/15 0419  HGBA1C 5.8*   Fasting Lipid Panel  Recent Labs  04/29/15 0500  CHOL 122  HDL 46  LDLCALC 70  TRIG 31  CHOLHDL 2.7   Thyroid Function Tests No results for input(s): TSH, T4TOTAL, T3FREE, THYROIDAB in the last 72 hours.  Invalid input(s): FREET3  TELE  NSR  Radiology/Studies  Ct Abdomen Pelvis Wo Contrast  04/29/2015  CLINICAL DATA:  Acute onset of shortness of breath, bilateral leg pain and weakness. Initial encounter. EXAM: CT ABDOMEN AND PELVIS WITHOUT CONTRAST TECHNIQUE: Multidetector CT imaging of the abdomen and pelvis was performed following the standard protocol without IV contrast. COMPARISON:  CT of the abdomen and  pelvis from 03/16/2013, and MRI of the lumbar spine performed 04/28/2015 FINDINGS: The visualized lung bases are clear. The liver and spleen are unremarkable in appearance. Stones are seen layering dependently within the gallbladder. The gallbladder is otherwise unremarkable. The pancreas and adrenal glands are unremarkable. The kidneys are unremarkable in appearance. There is no evidence of hydronephrosis. No renal or ureteral stones are seen. Mild nonspecific perinephric stranding is noted bilaterally. No free fluid is identified. The small bowel is unremarkable in appearance. The stomach is within normal limits. No acute vascular abnormalities are seen. Minimal calcification is noted along the abdominal aorta. An IVC filter is noted. The appendix is difficult to fully assess but appears grossly unremarkable, without evidence of appendicitis. Contrast progresses to the level of the transverse colon. The colon is unremarkable in appearance. The bladder is mildly distended and grossly unremarkable. The prostate remains normal in size. No inguinal lymphadenopathy is seen. No acute osseous abnormalities are identified. Diffuse sclerotic change is noted within the pelvis. Postoperative change is noted at the left iliac bone. The patient is status post lumbar spinal fusion at L5-S1. An apparent large Schmorl's node is noted at the inferior endplate of L3, as noted on prior MRI. IMPRESSION: 1. No acute abnormality seen within the abdomen or pelvis. 2. Cholelithiasis.  Gallbladder otherwise unremarkable. Electronically Signed   By: Roanna Raider M.D.   On: 04/29/2015 02:43   Dg Chest 2 View  04/28/2015  CLINICAL DATA:  Shortness of Breath EXAM: CHEST - 2 VIEW COMPARISON:  03/16/2013 FINDINGS: Cardiac shadow is within normal limits. The thoracic aorta is mildly tortuous. Emphysematous changes are noted with bullous formation particularly on the right. No focal infiltrate or sizable effusion is seen. No bony  abnormality is noted. IMPRESSION: Emphysematous changes without acute abnormality. Electronically Signed   By: Alcide Clever M.D.   On: 04/28/2015 18:54   Ct Angio Chest Pe W/cm &/or Wo Cm  04/29/2015  CLINICAL DATA:  Acute onset of mid chest pain and elevated D-dimer. Mild shortness of breath. Initial encounter. EXAM: CT ANGIOGRAPHY CHEST WITH CONTRAST TECHNIQUE: Multidetector CT imaging of the chest was performed using the standard protocol during bolus administration of intravenous contrast. Multiplanar CT image reconstructions and MIPs were obtained to evaluate the vascular anatomy. CONTRAST:  OMNIPAQUE IOHEXOL 350 MG/ML SOLN COMPARISON:  Chest radiograph performed 04/28/2015 FINDINGS: There is no evidence of pulmonary embolus. Trace bilateral pleural fluid is noted. Bilateral emphysematous change is seen, most prominent at the upper lung lobes, with prominent bullae. Mild underlying  scarring is seen, particularly at the lower lung lobes. There is no evidence of pneumothorax. No masses are identified; no abnormal focal contrast enhancement is seen. Scattered coronary artery calcifications are seen. The ascending thoracic aorta is borderline normal in caliber. No mediastinal lymphadenopathy is seen. No pericardial effusion is identified. A small 2.5 cm cystic focus at the periaortic region may reflect a small pericardial cyst. There appears to be focal irregular wall thickening at the mid esophagus, with surrounding tiny vessels. This is unusual, and an underlying esophageal mass with angiogenesis cannot be excluded. The great vessels are grossly unremarkable in appearance. No axillary lymphadenopathy is seen. The visualized portions of the thyroid gland are unremarkable in appearance. The visualized portions of the liver and spleen are unremarkable. The visualized portions of the pancreas, gallbladder, stomach, adrenal glands and kidneys are within normal limits. There is contrast filling collateral  vasculature throughout the left side of the neck, left chest wall and back. No acute osseous abnormalities are seen. Review of the MIP images confirms the above findings. IMPRESSION: 1. No evidence of pulmonary embolus. 2. Apparent focal irregular wall thickening at the mid esophagus, with surrounding tiny vessels. This is unusual, and underlying esophageal mass with associated angiogenesis is a concern. Endoscopy would be helpful for further evaluation, when and as deemed clinically appropriate. 3. Trace bilateral pleural fluid noted. Bilateral emphysematous change, most prominent at the upper lung lobes, with prominent bullae. Mild underlying scarring, particularly at the lower lung lobes. 4. 2.5 cm cystic focus at the periaortic region may reflect a small pericardial cyst. 5. Scattered coronary artery calcifications seen. Electronically Signed   By: Roanna Raider M.D.   On: 04/29/2015 22:01   Mr Lumbar Spine Wo Contrast  04/28/2015  CLINICAL DATA:  Pain in both legs with weakness since December 21st. Some shortness of breath since that time. EXAM: MRI LUMBAR SPINE WITHOUT CONTRAST TECHNIQUE: Multiplanar, multisequence MR imaging of the lumbar spine was performed. No intravenous contrast was administered. COMPARISON:  CT abdomen and pelvis 03/16/2013. No prior lumbar cross-sectional imaging available. FINDINGS: Segmentation: 5 lumbar type vertebral bodies are assumed. Alignment: The patient has undergone posterior and interbody fusion at L5-S1. Within limits for assessment on MR, the fusion appears solid. There is 4 mm of degenerative anterolisthesis at L4-5. Vertebrae: No worrisome osseous lesion.Fatty replaced marrow above and below L4-5 reflect chronic degenerative disc disease. Acute bone marrow edema accompanies a Schmorl's node projecting superiorly from L3-4. Multiple screws related to LEFT sacroiliac joint fusion. Conus medullaris: Normal in size, signal, and location. Serpiginous nerve roots are  indicative of severe stenosis. Paraspinal tissues: No evidence for hydronephrosis or paravertebral mass. Disc levels: L1-L2:  Mild bulge.  Mild facet arthropathy.  No impingement. L2-L3: Central and leftward protrusion extends to the foramen and beyond. BILATERAL facet arthropathy. Moderate stenosis. LEFT greater than RIGHT subarticular zone and foraminal zone narrowing affecting the L2 and L3 nerve roots. L3-L4: No disc protrusion centrally, but annular bulging and osteophytic ridging in conjunction with severe facet arthropathy and ligamentum flavum hypertrophy contribute to subarticular zone and foraminal zone narrowing. There is moderate to severe stenosis. BILATERAL L3 and L4 nerve root impingement are likely. L4-L5: 4 mm anterolisthesis. Severe disc space narrowing with osseous ridging. Mild annular bulging centrally. Severe facet arthropathy. BILATERAL subarticular zone and foraminal zone narrowing affect the L4 and L5 nerve roots. L5-S1:  No residual impingement. 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 BILATERAL L4 and L5 nerve root impingement are noted. Please note that these changes could be worse with patient standing particularly in flexion, if there is dynamic instability. Moderate to severe stenosis at L3-4, related to annular bulging and osseous ridging in conjunction with posterior element hypertrophy. Moderate stenosis at L2-3 related to central and leftward protrusion as well as posterior element hypertrophy. Solid appearing instrumented posterior and interbody fusion L5-S1. Electronically Signed   By: Elsie Stain M.D.   On: 04/28/2015 20:50    ASSESSMENT AND PLAN Norman Davis is a 74 y.o. male with a history of HLD, HTN, GERD, and schizoaffective disorder who presented to [facility] on 04/28/15 with chest pain, left leg pain and abdominal pain.   Chest pain/Elevated troponin: troponin  0.11--> 0.13--> 0.13--> 0.11. Flat.  -- Chest pain is atypical. ECG with no acute ST or TW changes. 2D ECHO reassuring with normal LV function with no RWMAs. He is very tender to palpation. Still with some discomfort with a deep breath in.  -- Empirically treated for pericarditis with colchicine and Ibuprofen. Pain seems to be the same although this is not his primary complaint. CTA done for PE with some scattered coronary artery calcifications. At this point, he would not be a good candidate for further cardiac evaluation.   Elevated D Dimer: CTA negative for PE. LE dopplers negative for DVT.   Esophageal thickening: seen on CTA. Apparent focal irregular wall thickening at the mid esophagus, with surrounding tiny vessels. This is unusual, and underlying esophageal mass with associated angiogenesis is a concern. Endoscopy would be helpful for further evaluation, when and as deemed clinically appropriate.  Dispo: likely stable to be discharged to SNF. Dr Elease Hashimoto to see.   Billy Fischer PA-C  Pager [phone]  Attending Note:   The patient was seen and examined.  Agree with assessment and plan as noted above.  Changes made to the above note as needed.  His CP is noncardiac.  Troponin levels are flat. - not c/w ACS.  Has chest wall tenderness     Vesta Mixer, Montez Hageman., MD, [facility] 04/30/2015, 1:13 PM [address],  [address] Office [phone] Pager [phone]

## 2015-04-30 NOTE — Progress Notes (Addendum)
PROGRESS NOTE  Norman Davis ZOX:[id number] DOB: 02-01-1941 DOA: 04/28/2015 PCP: No primary care provider on file.    Assessment/Plan: Chest pain and NSTEMI:  Cards consult -echo done - cycle CE flat - Nitroglycerin, Morphine, pravastatin, plavix - Patient is allergic to aspirin - Start low-dose Coreg 3.125 mg twice a day -d dimer elevated but CTA negative  Abdominal pain: Etiology is unclear. Lipase is negative. -CT abdomen/pelvis negative -CTA shows: focal irregular wall thickening at the mid esophagus, with surrounding tiny vessels. This is unusual, and underlying esophageal mass with associated angiogenesis is a concern. Endoscopy would be helpful for further evaluation, when and as deemed clinically appropriate. 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 -Continue home medications: Prozac, Remeron  GERD: -Protonix   PT rec SNF   Code Status: full Family Communication: patient- called all family members no answer Disposition Plan: SNF in AM   Consultants:  Cards  NS  Procedures:      HPI/Subjective: No chest pain  Objective: Filed Vitals:   04/30/15 0517 04/30/15 1243  BP: 153/90 133/90  Pulse: 65 73  Temp: 98 F (36.7 C) 98 F (36.7 C)  Resp: 18 19    Intake/Output Summary (Last 24 hours) at 04/30/15 1356 Last data filed at 04/30/15 1244  Gross per 24 hour  Intake    480 ml  Output    350 ml  Net    130 ml   Filed Weights   04/28/15  2220 04/28/15 2309  Weight: 89.812 kg (198 lb) 82.7 kg (182 lb 5.1 oz)    Exam:   General:  Awake, NAD  Cardiovascular: rrr  Respiratory: clear  Abdomen: +BS, soft  Musculoskeletal: no edema,. Left leg weaker than right  Data Reviewed: Basic Metabolic Panel:  Recent Labs Lab 04/28/15 1838 04/29/15 0419 04/30/15 0310  NA 146* 145 141  K 3.8 3.2* 3.7  CL 115* 112* 110  CO2 GLUCOSE 108* 94 101*  BUN CREATININE 1.25* 1.04 0.99  CALCIUM 9.2 8.7* 8.7*   Liver Function Tests:  Recent Labs Lab 04/29/15 0419  AST 19  ALT 11*  ALKPHOS 110  BILITOT 0.7  PROT 5.9*  ALBUMIN 3.0*    Recent Labs Lab 04/28/15 2355  LIPASE 22   No results for input(s): AMMONIA in the last 168 hours. CBC:  Recent Labs Lab 04/28/15 1838 04/29/15 0419 04/30/15 0310  WBC 6.4 5.3 5.1  NEUTROABS 4.4  --   --   HGB 13.0 11.7* 11.5*  HCT 40.5 35.9* 36.3*  MCV 88.8 88.9 88.5  PLT 166 143* 143*   Cardiac Enzymes:  Recent Labs Lab 04/28/15 1838 04/28/15 2355 04/29/15 0440 04/29/15 1111  TROPONINI 0.11* 0.13* 0.13* 0.11*   BNP (last 3 results)  Recent Labs  04/28/15 1838  BNP 23.1    ProBNP (last 3 results) No results for input(s): PROBNP in the last 8760 hours.  CBG: No results for input(s): GLUCAP in the last 168 hours.  No results found for this or  any previous visit (from the past 240 hour(s)).   Studies: Ct Abdomen Pelvis Wo Contrast  04/29/2015  CLINICAL DATA:  Acute onset of shortness of breath, bilateral leg pain and weakness. Initial encounter. EXAM: CT ABDOMEN AND PELVIS WITHOUT CONTRAST TECHNIQUE: Multidetector CT imaging of the abdomen and pelvis was performed following the standard protocol without IV contrast. COMPARISON:  CT of the abdomen and pelvis from 03/16/2013, and MRI of the lumbar spine performed 04/28/2015 FINDINGS: The visualized lung bases are clear. The liver and spleen are unremarkable in appearance. Stones are seen  layering dependently within the gallbladder. The gallbladder is otherwise unremarkable. The pancreas and adrenal glands are unremarkable. The kidneys are unremarkable in appearance. There is no evidence of hydronephrosis. No renal or ureteral stones are seen. Mild nonspecific perinephric stranding is noted bilaterally. No free fluid is identified. The small bowel is unremarkable in appearance. The stomach is within normal limits. No acute vascular abnormalities are seen. Minimal calcification is noted along the abdominal aorta. An IVC filter is noted. The appendix is difficult to fully assess but appears grossly unremarkable, without evidence of appendicitis. Contrast progresses to the level of the transverse colon. The colon is unremarkable in appearance. The bladder is mildly distended and grossly unremarkable. The prostate remains normal in size. No inguinal lymphadenopathy is seen. No acute osseous abnormalities are identified. Diffuse sclerotic change is noted within the pelvis. Postoperative change is noted at the left iliac bone. The patient is status post lumbar spinal fusion at L5-S1. An apparent large Schmorl's node is noted at the inferior endplate of L3, as noted on prior MRI. IMPRESSION: 1. No acute abnormality seen within the abdomen or pelvis. 2. Cholelithiasis.  Gallbladder otherwise unremarkable. Electronically Signed   By: Jeffery  Chang M.D.   On: 04/29/2015 02:43   Dg Chest 2 View  04/28/2015  CLINICAL DATA:  Shortness of Breath EXAM: CHEST - 2 VIEW COMPARISON:  03/16/2013 FINDINGS: Cardiac shadow is within normal limits. The thoracic aorta is mildly tortuous. Emphysematous changes are noted with bullous formation particularly on the right. No focal infiltrate or sizable effusion is seen. No bony abnormality is noted. IMPRESSION: Emphysematous changes without acute abnormality. Electronically Signed   By: Mark  Lukens M.D.   On: 04/28/2015 18:54   Ct Angio Chest Pe W/cm &/or Wo  Cm  04/29/2015  CLINICAL DATA:  Acute onset of mid chest pain and elevated D-dimer. Mild shortness of breath. Initial encounter. EXAM: CT ANGIOGRAPHY CHEST WITH CONTRAST TECHNIQUE: Multidetector CT imaging of the chest was performed using the standard protocol during bolus administration of intravenous contrast. Multiplanar CT image reconstructions and MIPs were obtained to evaluate the vascular anatomy. CONTRAST:  <MEASUREMEngCalifornia Hospital Medical Center - Los AngelesFEliberto Ivo103 West Hig43m409Maryl<MEASUREMENEngJCarrusSelect Specialty Hospital - [facility]ville ([facility])REliberto Ivo86 Manch51m40(3Maryl<MEASUREMENEngJGrand RaSoutheast Louisiana Veterans Health Care SystemiEliberto Ivo9611m403Maryl<MEASUREMENEngJCjw Medical Center JoPalm BeLargo Medical CentercEliberto Ivo452 Glen28m403Maryl<MEASUREMENEngJFaulktoWilliam Bee Ririe HospitalLEliberto Ivo89056m40(2Maryl<MEASUREMENEngJSaint Andrews HospiSouth Florida Ambulatory Surgical Center LLCaEliberto Ivo3764m406Maryl<MEASUREMENEngJSt AlAdGreat Lakes Eye Surgery Center LLCiEliberto Ivo338 West 21m403Maryl<MEASUREMENEngJCoastaBCalifornia Pacific Med Ctr-Davies CampuseEliberto Ivo49 East80m409Maryl<MEASUREMENEngJMiMclaren Greater LansingsEliberto Ivo[address]. 74m402Maryl<MEASUREMENEngJFroedtert South KSt Charles PrinevilleSEliberto Ivo9341 Gl23m406Maryl<MEASUREMENEngJPipestone Our Lady Of Fatima HospitalrEliberto Ivo30 N73m402Maryl<MEASUREMENEngJGreTexas Health Resource Preston Plaza Surgery CenternEliberto Ivo43m40(7Maryl<MEFolsom Sierra Endoscopy CenterSEliberto Iv21m4Maryl<MEASUREMENEngJTitus ReVibra Hospital Of BoiseMEliberto Ivo477 Hi28m4Maryl<MEASUChristus Santa Rosa Physicians Ambulatory Surgery Center IvEEliberto Ivo7590 We80m40(9Maryl<MEASUREMENEngJEKaiser Fnd Hosp - RosevillesEliberto Ivo7975 De38m402Maryl<MEASUREMENEngJDesert Springs HosLakCrane Creek Surgical Partners LLCvEliberto Ivo596 North 44m408Maryl<MEASUREMENEngJCharlotteLoFlorence Surgery Center LPeEliberto Ivo18 North 54m402Maryl<MEASUREMENEngJLahaye Center For Advanced EReading HospitaleEliberto Ivo60 West P65m406Maryland60 501 4187elette InctemsG/ML SOLN COMPARISON:  Chest radiograph performed 04/28/2015 FINDINGS: There is no evidence of pulmonary embolus. Trace bilateral pleural fluid is noted. Bilateral emphysematous change is seen, most prominent at the upper lung lobes, with prominent bullae. Mild underlying scarring is seen, particularly at the lower lung lobes. There is no evidence of pneumothorax. No masses are identified; no abnormal focal contrast enhancement is seen. Scattered coronary artery calcifications are seen. The ascending thoracic aorta is borderline normal in caliber. No mediastinal lymphadenopathy is seen. No pericardial effusion is identified. A small 2.5 cm cystic focus at the periaortic region may reflect a small pericardial cyst. There appears to be focal irregular wall thickening at the mid esophagus, with surrounding tiny vessels. This is unusual, and an underlying esophageal mass with angiogenesis cannot be excluded. The  great vessels are grossly unremarkable in appearance. No axillary lymphadenopathy is seen. The visualized portions of the thyroid gland are unremarkable in appearance. The visualized portions of the liver and spleen are unremarkable. The visualized portions of the pancreas, gallbladder, stomach, adrenal glands and kidneys are within normal limits. There is contrast filling collateral vasculature throughout the left side of the neck, left chest wall and back. No acute osseous abnormalities are seen. Review of the MIP images confirms the above findings. IMPRESSION: 1. No  evidence of pulmonary embolus. 2. Apparent focal irregular wall thickening at the mid esophagus, with surrounding tiny vessels. This is unusual, and underlying esophageal mass with associated angiogenesis is a concern. Endoscopy would be helpful for further evaluation, when and as deemed clinically appropriate. 3. Trace bilateral pleural fluid noted. Bilateral emphysematous change, most prominent at the upper lung lobes, with prominent bullae. Mild underlying scarring, particularly at the lower lung lobes. 4. 2.5 cm cystic focus at the periaortic region may reflect a small pericardial cyst. 5. Scattered coronary artery calcifications seen. Electronically Signed   By: Roanna Raider M.D.   On: 04/29/2015 22:01   Mr Lumbar Spine Wo Contrast  04/28/2015  CLINICAL DATA:  Pain in both legs with weakness since December 21st. Some shortness of breath since that time. EXAM: MRI LUMBAR SPINE WITHOUT CONTRAST TECHNIQUE: Multiplanar, multisequence MR imaging of the lumbar spine was performed. No intravenous contrast was administered. COMPARISON:  CT abdomen and pelvis 03/16/2013. No prior lumbar cross-sectional imaging available. FINDINGS: Segmentation: 5 lumbar type vertebral bodies are assumed. Alignment: The patient has undergone posterior and interbody fusion at L5-S1. Within limits for assessment on MR, the fusion appears solid. There is 4 mm of degenerative anterolisthesis at L4-5. Vertebrae: No worrisome osseous lesion.Fatty replaced marrow above and below L4-5 reflect chronic degenerative disc disease. Acute bone marrow edema accompanies a Schmorl's node projecting superiorly from L3-4. Multiple screws related to LEFT sacroiliac joint fusion. Conus medullaris: Normal in size, signal, and location. Serpiginous nerve roots are indicative of severe stenosis. Paraspinal tissues: No evidence for hydronephrosis or paravertebral mass. Disc levels: L1-L2:  Mild bulge.  Mild facet arthropathy.  No impingement. L2-L3:  Central and leftward protrusion extends to the foramen and beyond. BILATERAL facet arthropathy. Moderate stenosis. LEFT greater than RIGHT subarticular zone and foraminal zone narrowing affecting the L2 and L3 nerve roots. L3-L4: No disc protrusion centrally, but annular bulging and osteophytic ridging in conjunction with severe facet arthropathy and ligamentum flavum hypertrophy contribute to subarticular zone and foraminal zone narrowing. There is moderate to severe stenosis. BILATERAL L3 and L4 nerve root impingement are likely. L4-L5: 4 mm anterolisthesis. Severe disc space narrowing with osseous ridging. Mild annular bulging centrally. Severe facet arthropathy. BILATERAL subarticular zone and foraminal zone narrowing affect the L4 and L5 nerve roots. L5-S1:  No residual impingement. 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 BILATERAL L4 and L5 nerve root impingement are noted. Please note that these changes could be worse with patient standing particularly in flexion, if there is dynamic instability. Moderate to severe stenosis at L3-4, related to annular bulging and osseous ridging in conjunction with posterior element hypertrophy. Moderate stenosis at L2-3 related to central and leftward protrusion as well as posterior element hypertrophy. Solid appearing instrumented posterior and interbody fusion L5-S1. Electronically Signed   By: Elsie Stain M.D.   On: 04/28/2015 20:50    Scheduled Meds: . amLODipine  10 mg  Oral Daily  . brimonidine  1 drop Both Eyes BID  . carvedilol  3.125 mg Oral BID WC  . clopidogrel  75 mg Oral Q breakfast  . colchicine  0.6 mg Oral Daily  . finasteride  5 mg Oral Daily  . FLUoxetine  20 mg Oral Daily  . ibuprofen  600 mg Oral TID WC  . loratadine  10 mg Oral Daily  . mirabegron ER  25 mg Oral Daily  . mirtazapine  30 mg Oral QHS  . pantoprazole  40 mg Oral Daily   . pravastatin  40 mg Oral q1800  . sodium chloride  3 mL Intravenous Q12H  . tiotropium  18 mcg Inhalation Daily   Continuous Infusions: . sodium chloride 75 mL/hr at 04/29/15 2010   Antibiotics Given (last 72 hours)    None      Principal Problem:   Chest pain Active Problems:   AKI (acute kidney injury) (HCC)   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)   Hypercholesteremia   Hypertension   COPD (chronic obstructive pulmonary disease) (HCC)   Leg pain, left   NSTEMI (non-ST elevated myocardial infarction) (HCC)   Abdominal pain    Time spent: 25 min    JESSICA U [facility]  [facility] Pager [phone]. If 7PM-7AM, please contact night-coverage at [email or web address], password [facility] 04/30/2015, 1:56 PM  LOS: 2 days

## 2015-04-30 NOTE — Progress Notes (Signed)
ANTICOAGULATION CONSULT NOTE - Follow Up Consult  Pharmacy Consult for heparin Indication: chest pain/ACS  Allergies  Allergen Reactions  . Aspirin     unknown  . Penicillins     Unknown Has patient had a PCN reaction causing immediate rash, facial/tongue/throat swelling, SOB or lightheadedness with hypotension: NO Has patient had a PCN reaction causing severe rash involving mucus membranes or skin necrosis: NO Has patient had a PCN reaction that required hospitalization NO Has patient had a PCN reaction occurring within the last 10 years: NO If all of the above answers are "NO", then may proceed with Cephalosporin use.    Patient Measurements: Height: 5' 8.9" (175 cm) Weight: 182 lb 5.1 oz (82.7 kg) IBW/kg (Calculated) : 70.47  Vital Signs: Temp: 98 F (36.7 C) (12/29 0517) Temp Source: Oral (12/29 0517) BP: 153/90 mmHg (12/29 0517) Pulse Rate: 65 (12/29 0517)  Labs:  Recent Labs  04/28/15 1838 04/28/15 2355 04/29/15 0419 04/29/15 0440 04/29/15 0728 04/29/15 1111 04/30/15 0310  HGB 13.0  --  11.7*  --   --   --  11.5*  HCT 40.5  --  35.9*  --   --   --  36.3*  PLT 166  --  143*  --   --   --  143*  APTT  --  26  --   --   --   --   --   LABPROT  --  15.6*  --   --   --   --   --   INR  --  1.23  --   --   --   --   --   HEPARINUNFRC  --   --   --   --  0.65  --  0.65  CREATININE 1.25*  --  1.04  --   --   --  0.99  TROPONINI 0.11* 0.13*  --  0.13*  --  0.11*  --     Estimated Creatinine Clearance: 63.3 mL/min (by C-G formula based on Cr of 0.99).  Assessment: 74 yo M on heparin for chest tightness, r/o ACS. Troponin 0.11,0.13, 0.13.  EKG neg for STEMI.  His heparin level is therapeutic at 0.65 on heparin at 1100 units/hr.  Hg 13>11.7>11.7, PLTC 161>096>045166>143>143. CTA neg for PE.  .  Goal of Therapy:  Heparin level 0.3-0.7 units/ml Monitor platelets by anticoagulation protocol: Yes   Plan:  - continue heparin 1100 units/hr - daily HL/CBC ? Stop heparin  ??  Herby AbrahamMichelle T. Doris Mcgilvery, Pharm.D. [phone] 04/30/2015 11:59 AM

## 2015-04-30 NOTE — Progress Notes (Signed)
Patient ID: Norman Davis, male   DOB: Sep 20, 1940, 74 y.o.   MRN: [id number] Neuro stable. To see me in my office with his family for further spine care

## 2015-05-01 LAB — BASIC METABOLIC PANEL
Anion gap: 7 (ref 5–15)
BUN: 10 mg/dL (ref 6–20)
CHLORIDE: 110 mmol/L (ref 101–111)
CO2: 25 mmol/L (ref 22–32)
CREATININE: 1 mg/dL (ref 0.61–1.24)
Calcium: 9.1 mg/dL (ref 8.9–10.3)
GFR calc Af Amer: >60 mL/min (ref >60)
GFR calc non Af Amer: >60 mL/min (ref >60)
Glucose, Bld: 91 mg/dL (ref 65–99)
Potassium: 3.6 mmol/L (ref 3.5–5.1)
SODIUM: 142 mmol/L (ref 135–145)

## 2015-05-01 LAB — GLUCOSE, CAPILLARY: GLUCOSE-CAPILLARY: 77 mg/dL (ref 65–99)

## 2015-05-01 MED ORDER — CARVEDILOL 3.125 MG PO TABS: 3.1250 mg | ORAL_TABLET | Freq: Two times a day (BID) | ORAL | Status: DC

## 2015-05-01 MED ORDER — OXYCODONE-ACETAMINOPHEN 5-325 MG PO TABS: 1.0000 | ORAL_TABLET | ORAL | Status: DC | PRN

## 2015-05-01 NOTE — Progress Notes (Signed)
Assumed care from [place], [place].

## 2015-05-01 NOTE — Progress Notes (Signed)
[facility] Anticipated discharge date:12/30 Family notified: left message for sister Transportation by [facility]- scheduled for 2:30pm  CSW signing off.  Merlyn LotJenna Holoman, LCSWA Clinical Social Worker [phone]

## 2015-05-01 NOTE — Care Management Important Message (Signed)
Important Message  Patient Details  Name: Norman Davis MRN: [id number] Date of Birth: 09-Feb-1941   Medicare Important Message Given:  Yes    Kyla BalzarineShealy, Irfan Veal Abena 05/01/2015, 3:34 PM

## 2015-05-01 NOTE — Discharge Summary (Signed)
Physician Discharge Summary  Norman Davis:[id number] DOB: [birth year] DOA: 04/28/2015  PCP: No primary care provider on file.  Admit date: 04/28/2015 Discharge date: 05/01/2015  Time spent: 35 minutes  Recommendations for Outpatient Follow-up:  1. Outpatient follow up with Dr. Jeral Fruit 2. 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 male with PMH of hypertension, hyperlipidemia, COPD, depression, GERD, schizophrenia, CAD, MI, kidney stone, gallstone, BPH, who presents with left leg pain, chest pain and abdominal pain.  Patient reports that he has been having left leg pain for about one week. He also has leg tingling sensations, but no weakness. Pt states that he had an injury to the left leg 30 years ago and has trouble with it occasionally. 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 Pt denies any recent injury.   Patient also reports having abdominal pain and nausea which have been going on for about one week. No diarrhea or vomiting. His abdominal pain is diffuse, constant, 8 out of 10 in severity. He had a normal bowel movement today.  Patient reports that he has been having chest pain for about one week. It is located in the substernal area, intermittent, 8 out of 10 in severity, nonradiating. It happens 3 times a day, each time it lasts for about 45 minutes and resolves  spontaneously. It is associated with shortness of breath, but no cough, fever or chills. Patient does not have symptoms of UTI, unilateral weakness, rashes.  In ED, patient was found to have troponin 0.11, lipase 22, negative urinalysis, WBC 6.4, temperature normal, no tachycardia, acute renal injury with creatinine 1.25. Chest x-ray showed emphysematous, but no infiltration. MRI of lumbar spine showed severe multifactorial spinal stenosis and bilateral L4 and L5 nerve root impingement. Patient is admitted to inpatient for further evaluation and treatment.  Hospital Course:  Chest pain and NSTEMI:  Cards consult -echo done - cycle CE flat - Patient is allergic to aspirin - Start low-dose Coreg 3.125 mg twice a day -d dimer elevated but CTA negative -seen by cards- no further work up  Abdominal pain: Etiology is unclear. Lipase is negative. -CT abdomen/pelvis negative -CTA shows: focal irregular wall thickening at the mid esophagus, with surrounding tiny vessels. This is unusual, and underlying esophageal mass with associated angiogenesis is a concern. Endoscopy would be helpful for further evaluation, when and as deemed clinically appropriate. -outpatient EGD  Severe spinal stenosis -left leg weaker than right B/l tingling NS consult- botero- follow up outpatient  AKI: Likely due to prerenal secondary to dehydration and continuation of NSAIDs -resolved  Schizophrenia: in remission (HCC) -No acute issues  HLD: Last LDL was not on record -Continue home medications: Pravastatin   HTN: -Coreg -Amlodipine  COPD (chronic obstructive pulmonary disease) (HCC): stable -prn albuterol and Spiriva inhaler  Depression: Stable, no suicidal or homicidal ideations. -Continue home medications: Prozac,  Remeron  GERD: -Protonix  Procedures:  Echo: Study Conclusions  - Left ventricle: Abnormal septal motion The cavity size was normal. Wall thickness was increased in a pattern of  mild LVH. Systolic function was normal. The estimated ejection fraction was in the range of 50% to 55%. - Mitral valve: Calcified annulus. Mildly thickened leaflets . - Atrial septum: No defect or patent foramen ovale was identified  Consultations:  NS- botero  cardiology  Discharge Exam: Filed Vitals:   05/01/15 0821 05/01/15 0942  BP: 135/80 139/87  Pulse: 79   Temp:    Resp:      General: awake, NAD Cardiovascular: rrr Respiratory: clear  Discharge Instructions   Discharge Instructions    Diet - low sodium heart healthy    Complete by:  As directed      Diet Carb Modified    Complete by:  As directed      Discharge instructions    Complete by:  As directed   Outpatient follow up with Dr. Jeral Fruit Outpatient GI follow up for EGD     Increase activity slowly    Complete by:  As directed           Current Discharge Medication List    START taking these medications   Details  carvedilol (COREG) 3.125 MG tablet Take 1 tablet (3.125 mg total) by mouth 2 (two) times daily with a meal.    oxyCODONE-acetaminophen (PERCOCET/ROXICET) 5-325 MG tablet Take 1 tablet by mouth every 4 (four) hours as needed for moderate pain. Qty: 15 tablet, Refills: 0      CONTINUE these medications which have NOT CHANGED   Details  albuterol (PROVENTIL HFA;VENTOLIN HFA) 108 (90 BASE) MCG/ACT inhaler Inhale 2 puffs into the lungs every 4 (four) hours as needed for wheezing or shortness of breath.    amLODipine (NORVASC) 10 MG tablet Take 10 mg by mouth daily.    brimonidine (ALPHAGAN) 0.2 % ophthalmic solution Place 1 drop into both eyes 2 (two) times daily.    cetirizine (ZYRTEC) 10 MG tablet Take 10 mg by mouth daily.    clopidogrel (PLAVIX) 75 MG tablet Take 75 mg by mouth daily with breakfast.    finasteride (PROSCAR) 5 MG tablet Take 5 mg by mouth daily.    FLUoxetine (PROZAC) 20 MG capsule Take 20 mg by mouth daily.    meloxicam (MOBIC) 7.5 MG tablet Take 7.5 mg by  mouth daily.    Mirabegron (MYRBETRIQ PO) Take 1 tablet by mouth daily.    mirtazapine (REMERON) 30 MG tablet Take 30 mg by mouth at bedtime.    pantoprazole (PROTONIX) 40 MG tablet Take 40 mg by mouth daily.    pravastatin (PRAVACHOL) 40 MG tablet Take 40 mg by mouth daily.    tiotropium (SPIRIVA) 18 MCG inhalation capsule Place 18 mcg into inhaler and inhale daily.      STOP taking these medications     bisacodyl (DULCOLAX) 10 MG suppository        Allergies  Allergen Reactions  . Aspirin     unknown  . Penicillins     Unknown Has patient had a PCN reaction causing immediate rash, facial/tongue/throat swelling, SOB or lightheadedness with hypotension: NO Has patient had a PCN reaction causing severe rash involving mucus membranes or skin necrosis: NO Has patient had a PCN reaction that required hospitalization NO Has patient had a PCN reaction occurring within the last 10 years: NO If all of the above answers are "NO", then  may proceed with Cephalosporin use.      The results of significant diagnostics from this hospitalization (including imaging, microbiology, ancillary and laboratory) are listed below for reference.    Significant Diagnostic Studies: Ct Abdomen Pelvis Wo Contrast  04/29/2015  CLINICAL DATA:  Acute onset of shortness of breath, bilateral leg pain and weakness. Initial encounter. EXAM: CT ABDOMEN AND PELVIS WITHOUT CONTRAST TECHNIQUE: Multidetector CT imaging of the abdomen and pelvis was performed following the standard protocol without IV contrast. COMPARISON:  CT of the abdomen and pelvis from 03/16/2013, and MRI of the lumbar spine performed 04/28/2015 FINDINGS: The visualized lung bases are clear. The liver and spleen are unremarkable in appearance. Stones are seen layering dependently within the gallbladder. The gallbladder is otherwise unremarkable. The pancreas and adrenal glands are unremarkable. The kidneys are unremarkable in appearance. There is no  evidence of hydronephrosis. No renal or ureteral stones are seen. Mild nonspecific perinephric stranding is noted bilaterally. No free fluid is identified. The small bowel is unremarkable in appearance. The stomach is within normal limits. No acute vascular abnormalities are seen. Minimal calcification is noted along the abdominal aorta. An IVC filter is noted. The appendix is difficult to fully assess but appears grossly unremarkable, without evidence of appendicitis. Contrast progresses to the level of the transverse colon. The colon is unremarkable in appearance. The bladder is mildly distended and grossly unremarkable. The prostate remains normal in size. No inguinal lymphadenopathy is seen. No acute osseous abnormalities are identified. Diffuse sclerotic change is noted within the pelvis. Postoperative change is noted at the left iliac bone. The patient is status post lumbar spinal fusion at L5-S1. An apparent large Schmorl's node is noted at the inferior endplate of L3, as noted on prior MRI. IMPRESSION: 1. No acute abnormality seen within the abdomen or pelvis. 2. Cholelithiasis.  Gallbladder otherwise unremarkable. Electronically Signed   By: Roanna Raider M.D.   On: 04/29/2015 02:43   Dg Chest 2 View  04/28/2015  CLINICAL DATA:  Shortness of Breath EXAM: CHEST - 2 VIEW COMPARISON:  03/16/2013 FINDINGS: Cardiac shadow is within normal limits. The thoracic aorta is mildly tortuous. Emphysematous changes are noted with bullous formation particularly on the right. No focal infiltrate or sizable effusion is seen. No bony abnormality is noted. IMPRESSION: Emphysematous changes without acute abnormality. Electronically Signed   By: Alcide Clever M.D.   On: 04/28/2015 18:54   Ct Angio Chest Pe W/cm &/or Wo Cm  04/29/2015  CLINICAL DATA:  Acute onset of mid chest pain and elevated D-dimer. Mild shortness of breath. Initial encounter. EXAM: CT ANGIOGRAPHY CHEST WITH CONTRAST TECHNIQUE: Multidetector CT  imaging of the chest was performed using the standard protocol during bolus administration of intravenous contrast. Multiplanar CT image reconstructions and MIPs were obtained to evaluate the vascular anatomy. CONTRAST:  OMNIPAQUE IOHEXOL 350 MG/ML SOLN COMPARISON:  Chest radiograph performed 04/28/2015 FINDINGS: There is no evidence of pulmonary embolus. Trace bilateral pleural fluid is noted. Bilateral emphysematous change is seen, most prominent at the upper lung lobes, with prominent bullae. Mild underlying scarring is seen, particularly at the lower lung lobes. There is no evidence of pneumothorax. No masses are identified; no abnormal focal contrast enhancement is seen. Scattered coronary artery calcifications are seen. The ascending thoracic aorta is borderline normal in caliber. No mediastinal lymphadenopathy is seen. No pericardial effusion is identified. A small 2.5 cm cystic focus at the periaortic region may reflect a small pericardial cyst. There appears to be focal irregular  wall thickening at the mid esophagus, with surrounding tiny vessels. This is unusual, and an underlying esophageal mass with angiogenesis cannot be excluded. The great vessels are grossly unremarkable in appearance. No axillary lymphadenopathy is seen. The visualized portions of the thyroid gland are unremarkable in appearance. The visualized portions of the liver and spleen are unremarkable. The visualized portions of the pancreas, gallbladder, stomach, adrenal glands and kidneys are within normal limits. There is contrast filling collateral vasculature throughout the left side of the neck, left chest wall and back. No acute osseous abnormalities are seen. Review of the MIP images confirms the above findings. IMPRESSION: 1. No evidence of pulmonary embolus. 2. Apparent focal irregular wall thickening at the mid esophagus, with surrounding tiny vessels. This is unusual, and underlying esophageal mass with associated  angiogenesis is a concern. Endoscopy would be helpful for further evaluation, when and as deemed clinically appropriate. 3. Trace bilateral pleural fluid noted. Bilateral emphysematous change, most prominent at the upper lung lobes, with prominent bullae. Mild underlying scarring, particularly at the lower lung lobes. 4. 2.5 cm cystic focus at the periaortic region may reflect a small pericardial cyst. 5. Scattered coronary artery calcifications seen. Electronically Signed   By: Roanna Raider M.D.   On: 04/29/2015 22:01   Mr Lumbar Spine Wo Contrast  04/28/2015  CLINICAL DATA:  Pain in both legs with weakness since December 21st. Some shortness of breath since that time. EXAM: MRI LUMBAR SPINE WITHOUT CONTRAST TECHNIQUE: Multiplanar, multisequence MR imaging of the lumbar spine was performed. No intravenous contrast was administered. COMPARISON:  CT abdomen and pelvis 03/16/2013. No prior lumbar cross-sectional imaging available. FINDINGS: Segmentation: 5 lumbar type vertebral bodies are assumed. Alignment: The patient has undergone posterior and interbody fusion at L5-S1. Within limits for assessment on MR, the fusion appears solid. There is 4 mm of degenerative anterolisthesis at L4-5. Vertebrae: No worrisome osseous lesion.Fatty replaced marrow above and below L4-5 reflect chronic degenerative disc disease. Acute bone marrow edema accompanies a Schmorl's node projecting superiorly from L3-4. Multiple screws related to LEFT sacroiliac joint fusion. Conus medullaris: Normal in size, signal, and location. Serpiginous nerve roots are indicative of severe stenosis. Paraspinal tissues: No evidence for hydronephrosis or paravertebral mass. Disc levels: L1-L2:  Mild bulge.  Mild facet arthropathy.  No impingement. L2-L3: Central and leftward protrusion extends to the foramen and beyond. BILATERAL facet arthropathy. Moderate stenosis. LEFT greater than RIGHT subarticular zone and foraminal zone narrowing affecting  the L2 and L3 nerve roots. L3-L4: No disc protrusion centrally, but annular bulging and osteophytic ridging in conjunction with severe facet arthropathy and ligamentum flavum hypertrophy contribute to subarticular zone and foraminal zone narrowing. There is moderate to severe stenosis. BILATERAL L3 and L4 nerve root impingement are likely. L4-L5: 4 mm anterolisthesis. Severe disc space narrowing with osseous ridging. Mild annular bulging centrally. Severe facet arthropathy. BILATERAL subarticular zone and foraminal zone narrowing affect the L4 and L5 nerve roots. L5-S1:  No residual impingement. 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 BILATERAL L4 and L5 nerve root impingement are noted. Please note that these changes could be worse with patient standing particularly in flexion, if there is dynamic instability. Moderate to severe stenosis at L3-4, related to annular bulging and osseous ridging in conjunction with posterior element hypertrophy. Moderate stenosis at L2-3 related to central and leftward protrusion as well as posterior element hypertrophy. Solid appearing instrumented posterior and interbody fusion L5-S1.  Electronically Signed   By: Elsie StainJohn T Curnes M.D.   On: 04/28/2015 20:50    Microbiology: No results found for this or any previous visit (from the past 240 hour(s)).   Labs: Basic Metabolic Panel:  Recent Labs Lab 04/28/15 1838 04/29/15 0419 04/30/15 0310 05/01/15 0311  NA 146* 145 141 142  K 3.8 3.2* 3.7 3.6  CL 115* 112* 110 110  CO2 23 24 25 25   GLUCOSE 108* 94 101* 91  BUN 17 14 11 10   CREATININE 1.25* 1.04 0.99 1.00  CALCIUM 9.2 8.7* 8.7* 9.1   Liver Function Tests:  Recent Labs Lab 04/29/15 0419  AST 19  ALT 11*  ALKPHOS 110  BILITOT 0.7  PROT 5.9*  ALBUMIN 3.0*    Recent Labs Lab 04/28/15 2355  LIPASE 22   No results for input(s): AMMONIA in the  last 168 hours. CBC:  Recent Labs Lab 04/28/15 1838 04/29/15 0419 04/30/15 0310  WBC 6.4 5.3 5.1  NEUTROABS 4.4  --   --   HGB 13.0 11.7* 11.5*  HCT 40.5 35.9* 36.3*  MCV 88.8 88.9 88.5  PLT 166 143* 143*   Cardiac Enzymes:  Recent Labs Lab 04/28/15 1838 04/28/15 2355 04/29/15 0440 04/29/15 1111  TROPONINI 0.11* 0.13* 0.13* 0.11*   BNP: BNP (last 3 results)  Recent Labs  04/28/15 1838  BNP 23.1    ProBNP (last 3 results) No results for input(s): PROBNP in the last 8760 hours.  CBG:  Recent Labs Lab 05/01/15 0624  GLUCAP 77       Signed:  Decklyn Hyder U Alexande Sheerin DO  [facility] 05/01/2015, 12:29 PM

## 2015-05-01 NOTE — Progress Notes (Signed)
Patient was discharged by EMS [facility]. IV was d'cd on 04/30/15. Patient's back brace was on during transport.

## 2015-05-01 NOTE — Progress Notes (Signed)
Bed offers discussed with patient who deferred decision to his sister Norman PeltonLillie Davis. Spoke with [facility] via phone and [facility] [address]  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  Discussed with Dr. Benjamine MolaVann- anticipate d/c to SNF tomorrow if medically stable.  Bed will be available at facility.  Norman Davis, [place] [phone]

## 2015-05-07 ENCOUNTER — Non-Acute Institutional Stay (SKILLED_NURSING_FACILITY): Payer: Medicare Other | Admitting: Internal Medicine

## 2015-05-07 DIAGNOSIS — F209 Schizophrenia, unspecified: Secondary | ICD-10-CM | POA: Diagnosis not present

## 2015-05-07 DIAGNOSIS — I10 Essential (primary) hypertension: Secondary | ICD-10-CM

## 2015-05-07 DIAGNOSIS — J439 Emphysema, unspecified: Secondary | ICD-10-CM

## 2015-05-07 DIAGNOSIS — R079 Chest pain, unspecified: Secondary | ICD-10-CM | POA: Diagnosis not present

## 2015-05-07 DIAGNOSIS — R109 Unspecified abdominal pain: Secondary | ICD-10-CM | POA: Diagnosis not present

## 2015-05-07 DIAGNOSIS — M4800 Spinal stenosis, site unspecified: Secondary | ICD-10-CM

## 2015-05-15 ENCOUNTER — Non-Acute Institutional Stay (SKILLED_NURSING_FACILITY): Payer: Medicare Other | Admitting: Nurse Practitioner

## 2015-05-15 ENCOUNTER — Encounter: Payer: Self-pay | Admitting: Nurse Practitioner

## 2015-05-15 DIAGNOSIS — R109 Unspecified abdominal pain: Secondary | ICD-10-CM | POA: Diagnosis not present

## 2015-05-15 DIAGNOSIS — J439 Emphysema, unspecified: Secondary | ICD-10-CM

## 2015-05-15 DIAGNOSIS — I214 Non-ST elevation (NSTEMI) myocardial infarction: Secondary | ICD-10-CM | POA: Diagnosis not present

## 2015-05-15 DIAGNOSIS — Z87448 Personal history of other diseases of urinary system: Secondary | ICD-10-CM

## 2015-05-15 DIAGNOSIS — E7800 Pure hypercholesterolemia, unspecified: Secondary | ICD-10-CM | POA: Diagnosis not present

## 2015-05-15 DIAGNOSIS — I10 Essential (primary) hypertension: Secondary | ICD-10-CM

## 2015-05-15 DIAGNOSIS — Z87898 Personal history of other specified conditions: Secondary | ICD-10-CM

## 2015-05-15 NOTE — Progress Notes (Signed)
Nursing Home Location:  [facility]   Place of Service: SNF (31)  PCP: No primary care provider on file.  Allergies  Allergen Reactions  . Aspirin     unknown  . Penicillins     Unknown Has patient had a PCN reaction causing immediate rash, facial/tongue/throat swelling, SOB or lightheadedness with hypotension: NO Has patient had a PCN reaction causing severe rash involving mucus membranes or skin necrosis: NO Has patient had a PCN reaction that required hospitalization NO Has patient had a PCN reaction occurring within the last 10 years: NO If all of the above answers are "NO", then may proceed with Cephalosporin use.    Chief Complaint  Patient presents with  . Discharge Note    HPI:  Patient is a 75 y.o. male seen today at [facility] for discharge home. Pt with PMH of hypertension, hyperlipidemia, COPD, depression, GERD, schizophrenia, CAD, MI, kidney stone, gallstone, BPH, who was hospitalized with left leg pain, chest pain and abdominal pain. Pt found to have NSTEMI after cardiac workup, allergic to ASA and therefore started on coreg BID.  Patient also reports having abdominal pain and nausea which have been going on for ~ one week prior to hospitalization. 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 Endoscopy would be helpful for further evaluation and was recommended as outpatient. Patient currently doing well with therapy, now stable to discharge home with home health.   Review of Systems:  Review of Systems  Constitutional: Negative for activity change, appetite change, fatigue and unexpected weight change.  HENT: Negative for congestion and hearing loss.   Eyes: Negative.   Respiratory: Negative for cough and shortness of breath.   Cardiovascular: Negative for chest pain, palpitations and leg swelling.    Gastrointestinal: Positive for abdominal pain (ongoing, without worsening or change). Negative for diarrhea and constipation.  Genitourinary: Negative for dysuria and difficulty urinating.  Musculoskeletal: Positive for back pain. Negative for myalgias and arthralgias.  Skin: Negative for color change and wound.  Neurological: Negative for dizziness and weakness.  Psychiatric/Behavioral: Negative for behavioral problems, confusion and agitation.    Past Medical History  Diagnosis Date  . Schizophrenia (HCC)   . Hypercholesteremia   . Hypertension   . H/O urinary retention   . GERD (gastroesophageal reflux disease)   . Small bowel obstruction due to adhesions (HCC)   . 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 Nephrolithiasis     right  . Cholelithiasis   . Shortness of breath dyspnea    Past Surgical History  Procedure Laterality Date  . Total hip arthroplasty    . Pelvic fracture surgery    . Liver surgery     Social History:   reports that he has quit smoking. His smoking use included Cigarettes. He smoked 1.00 pack per day. He has never used smokeless tobacco. He reports that he does not drink alcohol or use illicit drugs.  Family History  Problem Relation Age of Onset  . Cancer Mother   . Cancer Father     Medications: Patient's Medications  New Prescriptions   No medications on file  Previous Medications   ALBUTEROL (PROVENTIL HFA;VENTOLIN HFA) 108 (90 BASE) MCG/ACT INHALER    Inhale 2 puffs into the lungs every 4 (four) hours as needed for wheezing or shortness of breath.  AMLODIPINE (NORVASC) 10 MG TABLET    Take 10 mg by mouth daily.   BRIMONIDINE (ALPHAGAN) 0.2 % OPHTHALMIC SOLUTION    Place 1 drop into both eyes 2 (two) times daily.   CARVEDILOL (COREG) 3.125 MG TABLET    Take 1 tablet (3.125 mg total) by mouth 2 (two) times daily with a meal.   CETIRIZINE (ZYRTEC) 10 MG TABLET    Take 10 mg by mouth daily.   CLOPIDOGREL (PLAVIX) 75 MG TABLET     Take 75 mg by mouth daily with breakfast.   FINASTERIDE (PROSCAR) 5 MG TABLET    Take 5 mg by mouth daily.   FLUOXETINE (PROZAC) 20 MG CAPSULE    Take 20 mg by mouth daily.   MELOXICAM (MOBIC) 7.5 MG TABLET    Take 7.5 mg by mouth daily.   MIRABEGRON (MYRBETRIQ PO)    Take 1 tablet by mouth daily.   MIRTAZAPINE (REMERON) 30 MG TABLET    Take 30 mg by mouth at bedtime.   OXYCODONE-ACETAMINOPHEN (PERCOCET/ROXICET) 5-325 MG TABLET    Take 1 tablet by mouth every 4 (four) hours as needed for moderate pain.   PANTOPRAZOLE (PROTONIX) 40 MG TABLET    Take 40 mg by mouth daily.   PRAVASTATIN (PRAVACHOL) 40 MG TABLET    Take 40 mg by mouth daily.   TIOTROPIUM (SPIRIVA) 18 MCG INHALATION CAPSULE    Place 18 mcg into inhaler and inhale daily.  Modified Medications   No medications on file  Discontinued Medications   No medications on file     Physical Exam: Filed Vitals:   05/15/15 0910  BP: 147/81  Pulse: 58  Temp: 98 F (36.7 C)  TempSrc: Oral  Resp: 20  Weight: 193 lb 6.4 oz (87.726 kg)    Physical Exam  Constitutional: He is oriented to person, place, and time. He appears well-developed and well-nourished. No distress.  HENT:  Head: Normocephalic and atraumatic.  Mouth/Throat: Oropharynx is clear and moist. No oropharyngeal exudate.  Eyes: Conjunctivae and EOM are normal. Pupils are equal, round, and reactive to light.  Neck: Normal range of motion. Neck supple.  Cardiovascular: Normal rate, regular rhythm and normal heart sounds.   Pulmonary/Chest: Effort normal and breath sounds normal.  Abdominal: Soft. Bowel sounds are normal.  Musculoskeletal: He exhibits no edema or tenderness.  Neurological: He is alert and oriented to person, place, and time.  Skin: Skin is warm and dry. He is not diaphoretic.  Psychiatric: He has a normal mood and affect.    Labs reviewed: Basic Metabolic Panel:  Recent Labs  16/10/96 0419 04/30/15 0310 05/01/15 0311  NA 145 141 142  K 3.2*  3.7 3.6  CL 112* 110 110  CO2 24 25 25   GLUCOSE 94 101* 91  BUN 14 11 10   CREATININE 1.04 0.99 1.00  CALCIUM 8.7* 8.7* 9.1   Liver Function Tests:  Recent Labs  04/29/15 0419  AST 19  ALT 11*  ALKPHOS 110  BILITOT 0.7  PROT 5.9*  ALBUMIN 3.0*    Recent Labs  04/28/15 2355  LIPASE 22   No results for input(s): AMMONIA in the last 8760 hours. CBC:  Recent Labs  04/28/15 1838 04/29/15 0419 04/30/15 0310  WBC 6.4 5.3 5.1  NEUTROABS 4.4  --   --   HGB 13.0 11.7* 11.5*  HCT 40.5 35.9* 36.3*  MCV 88.8 88.9 88.5  PLT 166 143* 143*   TSH: No results for input(s): TSH in the last  8760 hours. A1C: Lab Results  Component Value Date   HGBA1C 5.8* 04/29/2015   Lipid Panel:  Recent Labs  04/29/15 0500  CHOL 122  HDL 46  LDLCALC 70  TRIG 31  CHOLHDL 2.7    Echo: Study Conclusions  - Left ventricle: Abnormal septal motion The cavity size was normal. Wall thickness was increased in a pattern of mild LVH. Systolic function was normal. The estimated ejection fraction was in the range of 50% to 55%. - Mitral valve: Calcified annulus. Mildly thickened leaflets . - Atrial septum: No defect or patent foramen ovale was identified   Assessment/Plan 1. NSTEMI (non-ST elevated myocardial infarction) (HCC) Worked up by cardiology in the hospital, currently without chest pains, started and conts on coreg 3.125 mg BID, pt with allergy to ASA.   2. Essential hypertension HTN stable, conts on coreg and amlodipine   3. Pulmonary emphysema, unspecified emphysema type (HCC) COPD remains stable, cont spiriva and albuterol PRN  4. Abdominal pain, unspecified abdominal location Ongoing but stable, good BMs noted. Will need outpatient follow up by GI for further evaluation   5. H/O urinary retention Stable conts on proscar  6. Hypercholesteremia Stable, conts on pravastatin  pt is stable for discharge-will need PT/OT per home health. No DME needed. Rx written.   will need to follow up with PCP within 2 weeks and GI follow up.      Janene HarveyJessica K. Biagio BorgEubanks, AGNP  [facility] [phone](Monday-Friday 8 am - 5 pm) [phone] (after hours)

## 2015-06-12 ENCOUNTER — Inpatient Hospital Stay (HOSPITAL_COMMUNITY)
Admission: AD | Admit: 2015-06-12 | Discharge: 2015-06-15 | DRG: 190 | Disposition: A | Discharge status: Home Health | Payer: Medicare Other | Source: Other Acute Inpatient Hospital | Attending: Internal Medicine | Admitting: Internal Medicine

## 2015-06-12 ENCOUNTER — Inpatient Hospital Stay (HOSPITAL_COMMUNITY): Payer: Medicare Other

## 2015-06-12 DIAGNOSIS — J440 Chronic obstructive pulmonary disease with acute lower respiratory infection: Secondary | ICD-10-CM | POA: Diagnosis present

## 2015-06-12 DIAGNOSIS — R0602 Shortness of breath: Secondary | ICD-10-CM

## 2015-06-12 DIAGNOSIS — Z880 Allergy status to penicillin: Secondary | ICD-10-CM

## 2015-06-12 DIAGNOSIS — N401 Enlarged prostate with lower urinary tract symptoms: Secondary | ICD-10-CM | POA: Diagnosis present

## 2015-06-12 DIAGNOSIS — E785 Hyperlipidemia, unspecified: Secondary | ICD-10-CM | POA: Diagnosis present

## 2015-06-12 DIAGNOSIS — J189 Pneumonia, unspecified organism: Secondary | ICD-10-CM | POA: Diagnosis present

## 2015-06-12 DIAGNOSIS — J439 Emphysema, unspecified: Secondary | ICD-10-CM

## 2015-06-12 DIAGNOSIS — Z7902 Long term (current) use of antithrombotics/antiplatelets: Secondary | ICD-10-CM

## 2015-06-12 DIAGNOSIS — G4700 Insomnia, unspecified: Secondary | ICD-10-CM | POA: Diagnosis present

## 2015-06-12 DIAGNOSIS — I10 Essential (primary) hypertension: Secondary | ICD-10-CM | POA: Diagnosis present

## 2015-06-12 DIAGNOSIS — R748 Abnormal levels of other serum enzymes: Secondary | ICD-10-CM | POA: Diagnosis present

## 2015-06-12 DIAGNOSIS — R7989 Other specified abnormal findings of blood chemistry: Secondary | ICD-10-CM

## 2015-06-12 DIAGNOSIS — Z87898 Personal history of other specified conditions: Secondary | ICD-10-CM | POA: Diagnosis present

## 2015-06-12 DIAGNOSIS — J441 Chronic obstructive pulmonary disease with (acute) exacerbation: Secondary | ICD-10-CM | POA: Diagnosis present

## 2015-06-12 DIAGNOSIS — F329 Major depressive disorder, single episode, unspecified: Secondary | ICD-10-CM | POA: Diagnosis present

## 2015-06-12 DIAGNOSIS — Z87891 Personal history of nicotine dependence: Secondary | ICD-10-CM | POA: Diagnosis not present

## 2015-06-12 DIAGNOSIS — J45909 Unspecified asthma, uncomplicated: Secondary | ICD-10-CM | POA: Diagnosis present

## 2015-06-12 DIAGNOSIS — E7800 Pure hypercholesterolemia, unspecified: Secondary | ICD-10-CM | POA: Diagnosis not present

## 2015-06-12 DIAGNOSIS — Z87448 Personal history of other diseases of urinary system: Secondary | ICD-10-CM | POA: Diagnosis not present

## 2015-06-12 DIAGNOSIS — F259 Schizoaffective disorder, unspecified: Secondary | ICD-10-CM | POA: Diagnosis present

## 2015-06-12 DIAGNOSIS — Z886 Allergy status to analgesic agent status: Secondary | ICD-10-CM | POA: Diagnosis not present

## 2015-06-12 DIAGNOSIS — R338 Other retention of urine: Secondary | ICD-10-CM | POA: Diagnosis present

## 2015-06-12 DIAGNOSIS — K219 Gastro-esophageal reflux disease without esophagitis: Secondary | ICD-10-CM | POA: Diagnosis present

## 2015-06-12 DIAGNOSIS — F209 Schizophrenia, unspecified: Secondary | ICD-10-CM | POA: Diagnosis present

## 2015-06-12 DIAGNOSIS — J209 Acute bronchitis, unspecified: Secondary | ICD-10-CM | POA: Diagnosis present

## 2015-06-12 DIAGNOSIS — J449 Chronic obstructive pulmonary disease, unspecified: Secondary | ICD-10-CM | POA: Diagnosis present

## 2015-06-12 DIAGNOSIS — R778 Other specified abnormalities of plasma proteins: Secondary | ICD-10-CM

## 2015-06-12 LAB — COMPREHENSIVE METABOLIC PANEL
ALT: 10 U/L — ABNORMAL LOW (ref 17–63)
ANION GAP: 11 (ref 5–15)
AST: 17 U/L (ref 15–41)
Albumin: 3.3 g/dL — ABNORMAL LOW (ref 3.5–5.0)
Alkaline Phosphatase: 152 U/L — ABNORMAL HIGH (ref 38–126)
BUN: 12 mg/dL (ref 6–20)
CHLORIDE: 106 mmol/L (ref 101–111)
CO2: 24 mmol/L (ref 22–32)
Calcium: 9.2 mg/dL (ref 8.9–10.3)
Creatinine, Ser: 0.95 mg/dL (ref 0.61–1.24)
GFR calc non Af Amer: >60 mL/min (ref >60)
Glucose, Bld: 174 mg/dL — ABNORMAL HIGH (ref 65–99)
POTASSIUM: 3.9 mmol/L (ref 3.5–5.1)
SODIUM: 141 mmol/L (ref 135–145)
Total Bilirubin: 0.5 mg/dL (ref 0.3–1.2)
Total Protein: 6.5 g/dL (ref 6.5–8.1)

## 2015-06-12 LAB — CBC
HCT: 38.4 % — ABNORMAL LOW (ref 39.0–52.0)
Hemoglobin: 12.5 g/dL — ABNORMAL LOW (ref 13.0–17.0)
MCH: 27.9 pg (ref 26.0–34.0)
MCHC: 32.6 g/dL (ref 30.0–36.0)
MCV: 85.7 fL (ref 78.0–100.0)
PLATELETS: 152 10*3/uL (ref 150–400)
RBC: 4.48 MIL/uL (ref 4.22–5.81)
RDW: 14.5 % (ref 11.5–15.5)
WBC: 4.5 10*3/uL (ref 4.0–10.5)

## 2015-06-12 LAB — TROPONIN I: Troponin I: 0.31 ng/mL — ABNORMAL HIGH (ref <0.031)

## 2015-06-12 MED ORDER — SODIUM CHLORIDE 0.9 % IV SOLN: 250.0000 mL | INTRAVENOUS | Status: DC | PRN

## 2015-06-12 MED ORDER — ACETAMINOPHEN 650 MG RE SUPP
650.0000 mg | Freq: Four times a day (QID) | RECTAL | Status: DC | PRN
Start: 2015-06-12 — End: 2015-06-15

## 2015-06-12 MED ORDER — DOXAZOSIN MESYLATE 8 MG PO TABS
8.0000 mg | ORAL_TABLET | Freq: Every day | ORAL | Status: DC
  Administered 2015-06-12 – 2015-06-14 (×3): 8 mg via ORAL
  Filled 2015-06-12 (×3): qty 1

## 2015-06-12 MED ORDER — FINASTERIDE 5 MG PO TABS
5.0000 mg | ORAL_TABLET | Freq: Every day | ORAL | Status: DC
  Administered 2015-06-13 – 2015-06-15 (×3): 5 mg via ORAL
  Filled 2015-06-12 (×3): qty 1

## 2015-06-12 MED ORDER — MIRTAZAPINE 15 MG PO TABS
30.0000 mg | ORAL_TABLET | Freq: Every day | ORAL | Status: DC
  Administered 2015-06-12 – 2015-06-14 (×3): 30 mg via ORAL
  Filled 2015-06-12 (×3): qty 2

## 2015-06-12 MED ORDER — ACETAMINOPHEN 325 MG PO TABS: 650.0000 mg | ORAL_TABLET | Freq: Four times a day (QID) | ORAL | Status: DC | PRN

## 2015-06-12 MED ORDER — LORATADINE 10 MG PO TABS
10.0000 mg | ORAL_TABLET | Freq: Every day | ORAL | Status: DC
  Administered 2015-06-13 – 2015-06-15 (×3): 10 mg via ORAL
  Filled 2015-06-12 (×3): qty 1

## 2015-06-12 MED ORDER — IPRATROPIUM-ALBUTEROL 0.5-2.5 (3) MG/3ML IN SOLN: 3.0000 mL | RESPIRATORY_TRACT | Status: DC | PRN

## 2015-06-12 MED ORDER — MORPHINE SULFATE (PF) 2 MG/ML IV SOLN: 2.0000 mg | Freq: Four times a day (QID) | INTRAVENOUS | Status: DC | PRN

## 2015-06-12 MED ORDER — SODIUM CHLORIDE 0.9% FLUSH
3.0000 mL | Freq: Two times a day (BID) | INTRAVENOUS | Status: DC
  Administered 2015-06-13 – 2015-06-14 (×4): 3 mL via INTRAVENOUS

## 2015-06-12 MED ORDER — MIRABEGRON ER 25 MG PO TB24
25.0000 mg | ORAL_TABLET | Freq: Every day | ORAL | Status: DC
  Administered 2015-06-13 – 2015-06-15 (×3): 25 mg via ORAL
  Filled 2015-06-12 (×3): qty 1

## 2015-06-12 MED ORDER — AMLODIPINE BESYLATE 10 MG PO TABS
10.0000 mg | ORAL_TABLET | Freq: Every day | ORAL | Status: DC
  Administered 2015-06-13 – 2015-06-15 (×3): 10 mg via ORAL
  Filled 2015-06-12 (×3): qty 1

## 2015-06-12 MED ORDER — CLOPIDOGREL BISULFATE 75 MG PO TABS
75.0000 mg | ORAL_TABLET | Freq: Every day | ORAL | Status: DC
  Administered 2015-06-13: 75 mg via ORAL
  Filled 2015-06-12: qty 1

## 2015-06-12 MED ORDER — ONDANSETRON HCL 4 MG PO TABS: 4.0000 mg | ORAL_TABLET | Freq: Four times a day (QID) | ORAL | Status: DC | PRN

## 2015-06-12 MED ORDER — ENOXAPARIN SODIUM 30 MG/0.3ML ~~LOC~~ SOLN
30.0000 mg | SUBCUTANEOUS | Status: DC
  Administered 2015-06-12: 30 mg via SUBCUTANEOUS
  Filled 2015-06-12 (×2): qty 0.3

## 2015-06-12 MED ORDER — SODIUM CHLORIDE 0.9% FLUSH
3.0000 mL | INTRAVENOUS | Status: DC | PRN
  Administered 2015-06-13: 3 mL via INTRAVENOUS
  Filled 2015-06-12: qty 3

## 2015-06-12 MED ORDER — ONDANSETRON HCL 4 MG/2ML IJ SOLN: 4.0000 mg | Freq: Four times a day (QID) | INTRAMUSCULAR | Status: DC | PRN

## 2015-06-12 MED ORDER — PANTOPRAZOLE SODIUM 40 MG PO TBEC
40.0000 mg | DELAYED_RELEASE_TABLET | Freq: Every day | ORAL | Status: DC
  Administered 2015-06-13 – 2015-06-15 (×3): 40 mg via ORAL
  Filled 2015-06-12 (×3): qty 1

## 2015-06-12 MED ORDER — SENNA 8.6 MG PO TABS
1.0000 | ORAL_TABLET | Freq: Every day | ORAL | Status: DC
  Administered 2015-06-13 – 2015-06-15 (×3): 8.6 mg via ORAL
  Filled 2015-06-12 (×3): qty 1

## 2015-06-12 MED ORDER — TIOTROPIUM BROMIDE MONOHYDRATE 18 MCG IN CAPS: 18.0000 ug | ORAL_CAPSULE | Freq: Every day | RESPIRATORY_TRACT | Status: DC

## 2015-06-12 MED ORDER — FLUOXETINE HCL 20 MG PO CAPS
40.0000 mg | ORAL_CAPSULE | Freq: Every day | ORAL | Status: DC
  Administered 2015-06-13 – 2015-06-15 (×3): 40 mg via ORAL
  Filled 2015-06-12 (×3): qty 2

## 2015-06-12 MED ORDER — PRAVASTATIN SODIUM 40 MG PO TABS
40.0000 mg | ORAL_TABLET | Freq: Every day | ORAL | Status: DC
  Administered 2015-06-12 – 2015-06-14 (×3): 40 mg via ORAL
  Filled 2015-06-12 (×3): qty 1

## 2015-06-12 MED ORDER — METHYLPREDNISOLONE SODIUM SUCC 125 MG IJ SOLR
60.0000 mg | Freq: Two times a day (BID) | INTRAMUSCULAR | Status: DC
  Administered 2015-06-12 – 2015-06-15 (×6): 60 mg via INTRAVENOUS
  Filled 2015-06-12 (×6): qty 2

## 2015-06-12 MED ORDER — IPRATROPIUM-ALBUTEROL 0.5-2.5 (3) MG/3ML IN SOLN
3.0000 mL | Freq: Four times a day (QID) | RESPIRATORY_TRACT | Status: DC
  Administered 2015-06-12 – 2015-06-15 (×12): 3 mL via RESPIRATORY_TRACT
  Filled 2015-06-12 (×2): qty 3
  Filled 2015-06-12: qty 39
  Filled 2015-06-12 (×9): qty 3

## 2015-06-12 MED ORDER — SODIUM CHLORIDE 0.9% FLUSH
3.0000 mL | Freq: Two times a day (BID) | INTRAVENOUS | Status: DC
  Administered 2015-06-12 – 2015-06-15 (×6): 3 mL via INTRAVENOUS

## 2015-06-12 NOTE — H&P (Signed)
[facility] History and Physical  Norman Davis ZOX:[id number] DOB: Dec 03, 1940 DOA: 06/12/2015  Referring physician: Dr. Karie Kirks PCP: Pcp Not In System  Specialists:   Chief Complaint: Shortness of breath  HPI: Norman Davis is a 75 y.o. male  With a history of hypertension, hyperlipidemia, schizoaffective disorder, presented to the emergency department in [place] with complaints of shortness of breath.  Patient shortness of breath started yesterday. He has been in no contact with persons, no recent travel. He has received his flu shot this year. Patient states he had finished cleaning yesterday and was using Clorox. He began to have lots of wheezing and shortness of breath with no coughing. Her sister via phone, patient does have history of emphysema and sees physicians at the [place]. Patient no longer smokes. Currently he denies any cough, fever, chills, chest pain, abdominal pain, nausea or vomiting. He does endorse some hesitancy with urination. Patient was transferred to [facility] for COPD exacerbation.  Review of Systems:  Constitutional: Denies fever, chills, diaphoresis, appetite change and fatigue.  HEENT: Denies photophobia, eye pain, redness, hearing loss, ear pain, congestion, sore throat, rhinorrhea, sneezing, mouth sores, trouble swallowing, neck pain, neck stiffness and tinnitus.   Respiratory: Shortness of breath and wheezing.   Cardiovascular: Denies chest pain, palpitations and leg swelling.  Gastrointestinal: Denies nausea, vomiting, abdominal pain, diarrhea, constipation, blood in stool and abdominal distention.  Genitourinary: Has difficulty urinating. Musculoskeletal: Denies myalgias, back pain, joint swelling, arthralgias and gait problem.  Skin: Denies pallor, rash and wound.  Neurological: Denies dizziness, seizures, syncope, weakness, light-headedness, numbness and headaches.  Hematological: Denies adenopathy. Easy bruising, personal or family bleeding  history  Psychiatric/Behavioral: Denies suicidal ideation, mood changes, confusion, nervousness, sleep disturbance and agitation  Past Medical History  Diagnosis Date  . Schizophrenia (HCC)   . Hypercholesteremia   . Hypertension   . H/O urinary retention   . GERD (gastroesophageal reflux disease)   . Small bowel obstruction due to adhesions (HCC)   . 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 Nephrolithiasis     right  . Cholelithiasis   . Shortness of breath dyspnea    Past Surgical History  Procedure Laterality Date  . Total hip arthroplasty    . Pelvic fracture surgery    . Liver surgery     Social History:  reports that he has quit smoking. His smoking use included Cigarettes. He smoked 1.00 pack per day. He has never used smokeless tobacco. He reports that he does not drink alcohol or use illicit drugs. From home.  Lives with his sister.  Allergies  Allergen Reactions  . Aspirin     unknown  . Penicillins     Unknown Has patient had a PCN reaction causing immediate rash, facial/tongue/throat swelling, SOB or lightheadedness with hypotension: NO Has patient had a PCN reaction causing severe rash involving mucus membranes or skin necrosis: NO Has patient had a PCN reaction that required hospitalization NO Has patient had a PCN reaction occurring within the last 10 years: NO If all of the above answers are "NO", then may proceed with Cephalosporin use.    Family History  Problem Relation Age of Onset  . Cancer Mother   . Cancer Father     Prior to Admission medications   Medication Sig Start Date End Date Taking? Authorizing Provider  albuterol (PROVENTIL HFA;VENTOLIN HFA) 108 (90 BASE) MCG/ACT inhaler Inhale 2 puffs into the lungs every 4 (four) hours as needed  for wheezing or shortness of breath.   Yes Historical Provider, MD  amLODipine (NORVASC) 10 MG tablet Take 10 mg by mouth daily.   Yes Historical Provider, MD  cetirizine (ZYRTEC) 10 MG tablet  Take 10 mg by mouth at bedtime.    Yes Historical Provider, MD  clopidogrel (PLAVIX) 75 MG tablet Take 75 mg by mouth daily with breakfast.   Yes Historical Provider, MD  doxazosin (CARDURA) 8 MG tablet Take 8 mg by mouth at bedtime.   Yes Historical Provider, MD  finasteride (PROSCAR) 5 MG tablet Take 5 mg by mouth daily.   Yes Historical Provider, MD  FLUoxetine (PROZAC) 20 MG capsule Take 40 mg by mouth daily.    Yes Historical Provider, MD  meloxicam (MOBIC) 7.5 MG tablet Take 7.5 mg by mouth daily.   Yes Historical Provider, MD  Mirabegron (MYRBETRIQ PO) Take 25 mg by mouth daily.    Yes Historical Provider, MD  mirtazapine (REMERON) 30 MG tablet Take 30 mg by mouth at bedtime.   Yes Historical Provider, MD  pantoprazole (PROTONIX) 40 MG tablet Take 40 mg by mouth daily.   Yes Historical Provider, MD  pravastatin (PRAVACHOL) 40 MG tablet Take 40 mg by mouth at bedtime.    Yes Historical Provider, MD  senna (SENOKOT) 8.6 MG TABS tablet Take 1 tablet by mouth daily.   Yes Historical Provider, MD  tiotropium (SPIRIVA) 18 MCG inhalation capsule Place 18 mcg into inhaler and inhale daily.   Yes Historical Provider, MD  carvedilol (COREG) 3.125 MG tablet Take 1 tablet (3.125 mg total) by mouth 2 (two) times daily with a meal. Patient not taking: Reported on 06/12/2015 05/01/15   [name], DO  oxyCODONE-acetaminophen (PERCOCET/ROXICET) 5-325 MG tablet Take 1 tablet by mouth every 4 (four) hours as needed for moderate pain. Patient not taking: Reported on 06/12/2015 05/01/15   [name], DO   Physical Exam: Filed Vitals:   06/12/15 1640  BP: 150/103  Pulse: 99  Temp: 98.1 F (36.7 C)     General: Well developed, well nourished, NAD, appears stated age  HEENT: NCAT, PERRLA, EOMI, Anicteic Sclera, mucous membranes moist.   Neck: Supple, no JVD, no masses  Cardiovascular: S1 S2 auscultated, no rubs, murmurs or gallops. Regular rate and rhythm.  Respiratory: Diffuse expiratory  wheezing  Abdomen: Soft, nontender, nondistended, + bowel sounds  Extremities: warm dry without cyanosis clubbing or edema  Neuro: AAOx3, cranial nerves grossly intact. Strength 5/5 in patient's upper and lower extremities bilaterally  Skin: Without rashes exudates or nodules  Psych: Normal affect and demeanor with intact judgement and insight  Labs on Admission:  Basic Metabolic Panel: No results for input(s): NA, K, CL, CO2, GLUCOSE, BUN, CREATININE, CALCIUM, MG, PHOS in the last 168 hours. Liver Function Tests: No results for input(s): AST, ALT, ALKPHOS, BILITOT, PROT, ALBUMIN in the last 168 hours. No results for input(s): LIPASE, AMYLASE in the last 168 hours. No results for input(s): AMMONIA in the last 168 hours. CBC: No results for input(s): WBC, NEUTROABS, HGB, HCT, MCV, PLT in the last 168 hours. Cardiac Enzymes: No results for input(s): CKTOTAL, CKMB, CKMBINDEX, TROPONINI in the last 168 hours.  BNP (last 3 results)  Recent Labs  04/28/15 1838  BNP 23.1    ProBNP (last 3 results) No results for input(s): PROBNP in the last 8760 hours.  CBG: No results for input(s): GLUCAP in the last 168 hours.  Radiological Exams on Admission: No results found.  EKG:  None  Assessment/Plan Acute COPD exacerbation -Patient transferred from [facility] ER to [facility] -Likely secondary to chemical inhalation -Will obtain chest x-ray, CBC, CMP -Continue supplemental oxygen to maintain saturations above 92% -Placed on Solu-Medrol, nebs, continue Spiriva -Patient denies cough at this time, currently afebrile, will hold antibiotic  Elevated troponin -Denies chest pain -Troponin was mildly elevated at previous hospital, 0.114 -After reviewing records here, it appears the patient has chronically elevated troponin -Will continue to monitor and cycle troponin, obtain EKG -Continue Plavix  Hypertension -Continue Cardura, Proscar, amlodipine  Urinary retention/BPH -Continue  Cardura, Proscar, myrbetriq  Hyperlipidemia -Continue statin  GERD -Continue PPI  Depression/schizophrenia/questionable insomnia -Continue Prozac, Remeron  DVT prophylaxis: Lovenox  Code Status: Full  Condition: Guarded   Family Communication: Sister via phone.  Admission, patients condition and plan of care including tests being ordered have been discussed with the patient and sister who indicate understanding and agree with the plan and Code Status.  Disposition Plan: Admitted.  Time spent: 60 minutes  Chanee Henrickson D.O. [facility] Pager [phone]  If 7PM-7AM, please contact night-coverage [email or web address] Password [facility] [place] 06/12/2015, 5:44 PM

## 2015-06-12 NOTE — Progress Notes (Signed)
Pt arrived on [address]; pt has been oriented to room, surroundings, and any applicable equipment. VSS.  CCMD notified of telemetry. Pt has been educated on the Falls Prevention Plan.  Pt in stable condition. 

## 2015-06-13 DIAGNOSIS — J189 Pneumonia, unspecified organism: Secondary | ICD-10-CM

## 2015-06-13 LAB — STREP PNEUMONIAE URINARY ANTIGEN: STREP PNEUMO URINARY ANTIGEN: NEGATIVE

## 2015-06-13 LAB — BASIC METABOLIC PANEL
ANION GAP: 10 (ref 5–15)
BUN: 16 mg/dL (ref 6–20)
CHLORIDE: 107 mmol/L (ref 101–111)
CO2: 25 mmol/L (ref 22–32)
CREATININE: 0.97 mg/dL (ref 0.61–1.24)
Calcium: 9 mg/dL (ref 8.9–10.3)
GFR calc non Af Amer: >60 mL/min (ref >60)
Glucose, Bld: 149 mg/dL — ABNORMAL HIGH (ref 65–99)
Potassium: 4.3 mmol/L (ref 3.5–5.1)
Sodium: 142 mmol/L (ref 135–145)

## 2015-06-13 LAB — CBC
HEMATOCRIT: 36.7 % — AB (ref 39.0–52.0)
HEMOGLOBIN: 11.8 g/dL — AB (ref 13.0–17.0)
MCH: 27.7 pg (ref 26.0–34.0)
MCHC: 32.2 g/dL (ref 30.0–36.0)
MCV: 86.2 fL (ref 78.0–100.0)
Platelets: 162 10*3/uL (ref 150–400)
RBC: 4.26 MIL/uL (ref 4.22–5.81)
RDW: 14.6 % (ref 11.5–15.5)
WBC: 5.7 10*3/uL (ref 4.0–10.5)

## 2015-06-13 LAB — HIV ANTIBODY (ROUTINE TESTING W REFLEX): HIV SCREEN 4TH GENERATION: NONREACTIVE

## 2015-06-13 LAB — TROPONIN I
Troponin I: 0.29 ng/mL — ABNORMAL HIGH (ref <0.031)
Troponin I: 0.25 ng/mL — ABNORMAL HIGH (ref <0.031)

## 2015-06-13 MED ORDER — CLOPIDOGREL BISULFATE 75 MG PO TABS
75.0000 mg | ORAL_TABLET | Freq: Every day | ORAL | Status: DC
  Administered 2015-06-14 – 2015-06-15 (×2): 75 mg via ORAL
  Filled 2015-06-13 (×2): qty 1

## 2015-06-13 MED ORDER — LEVOFLOXACIN IN D5W 750 MG/150ML IV SOLN
750.0000 mg | INTRAVENOUS | Status: DC
  Administered 2015-06-13 – 2015-06-14 (×2): 750 mg via INTRAVENOUS
  Filled 2015-06-13 (×2): qty 150

## 2015-06-13 MED ORDER — ENOXAPARIN SODIUM 40 MG/0.4ML ~~LOC~~ SOLN
40.0000 mg | SUBCUTANEOUS | Status: DC
  Administered 2015-06-13 – 2015-06-14 (×2): 40 mg via SUBCUTANEOUS
  Filled 2015-06-13 (×2): qty 0.4

## 2015-06-13 NOTE — Progress Notes (Signed)
[facility]                                                                              Patient Demographics  Norman Davis, is a 75 y.o. male, DOB - Sep 29, 1940, ZOX:[id number]  Admit date - 06/12/2015   Admitting Physician Starleen Arms, MD  Outpatient Primary MD for the patient is Pcp Not In System  LOS - 1   No chief complaint on file.     HPI on 06/12/2015 Norman Davis is a 75 y.o. male with a history of hypertension, hyperlipidemia, schizoaffective disorder, presented to the emergency department in [place] with complaints of shortness of breath. Patient shortness of breath started yesterday. He has been in no contact with persons, no recent travel. He has received his flu shot this year. Patient states he had finished cleaning yesterday and was using Clorox. He began to have lots of wheezing and shortness of breath with no coughing. Her sister via phone, patient does have history of emphysema and sees physicians at the [place]. Patient no longer smokes. Currently he denies any cough, fever, chills, chest pain, abdominal pain, nausea or vomiting. He does endorse some hesitancy with urination. Patient was transferred to [facility] for COPD exacerbation.  Assessment & Plan   Acute COPD exacerbation -Patient transferred from [facility] ER to [facility] -Likely secondary to chemical inhalation -Continue supplemental oxygen to maintain saturations above 92% -Placed on Solu-Medrol, nebs, continue Spiriva  Community Acquired pneumonia -CXR: Acute pneumonia involving the right upper lobe, superimposed on moderate to severe changes of acute bronchitis and/or asthma, baseline severe COPD/emphysema -Started pneumonia order set -Pending blood cultures, strep pneumonia urine Antigen -Placed on levaquin as patient has PCN allergy  -Currently afrebile with no leukocytosis   Elevated troponin -Denies chest pain -Troponin was mildly elevated at previous hospital, 0.114 -After reviewing records  here, it appears the patient has chronically elevated troponin -Troponin peak 0.31, however, trending downward -Continue Plavix  Hypertension -Continue Cardura, Proscar, amlodipine  Urinary retention/BPH -Continue Cardura, Proscar, myrbetriq  Hyperlipidemia -Continue statin  GERD -Continue PPI  Depression/schizophrenia/questionable insomnia -Continue Prozac, Remeron  Code Status: Full  Family Communication: None   Disposition Plan: Admitted  Time Spent in minutes   30 minutes  Procedures  None  Consults   None  DVT Prophylaxis  Lovenox  Lab Results  Component Value Date   PLT 162 06/13/2015    Medications  Scheduled Meds: . amLODipine  10 mg Oral Daily  . clopidogrel  75 mg Oral Q breakfast  . doxazosin  8 mg Oral QHS  . enoxaparin (LOVENOX) injection  40 mg Subcutaneous Q24H  . finasteride  5 mg Oral Daily  . FLUoxetine  40 mg Oral Daily  . ipratropium-albuterol  3 mL Nebulization Q6H  . levofloxacin (LEVAQUIN) IV  750 mg Intravenous Q24H  . loratadine  10 mg Oral Daily  . methylPREDNISolone (SOLU-MEDROL) injection  60 mg Intravenous Q12H  . mirabegron ER  25 mg Oral Daily  . mirtazapine  30 mg Oral QHS  . pantoprazole  40 mg Oral Daily  . pravastatin  40 mg Oral QHS  . senna  1 tablet Oral Daily  . sodium chloride flush  3 mL Intravenous Q12H  . sodium chloride flush  3 mL Intravenous Q12H   Continuous Infusions:  PRN Meds:.sodium chloride, acetaminophen **OR** acetaminophen, ipratropium-albuterol, morphine injection, ondansetron **OR** ondansetron (ZOFRAN) IV, sodium chloride flush  Antibiotics    Anti-infectives    Start     Dose/Rate Route Frequency Ordered Stop   06/13/15 0800  levofloxacin (LEVAQUIN) IVPB 750 mg     750 mg 100 mL/hr over 90 Minutes Intravenous Every 24 hours 06/13/15 0700 06/18/15 0759      Subjective:   Norman Davis seen and examined today.  Patient has no complaints today.  Denies chest pain, abdominal pain,  headache, dizziness.  Feels his breathing has improved.   Objective:   Filed Vitals:   06/12/15 2057 06/13/15 0131 06/13/15 0519 06/13/15 0912  BP:   145/90 149/97  Pulse:   83 95  Temp:   98 F (36.7 C)   TempSrc:   Oral   Resp:   18 18  Height:      Weight:   82.373 kg (181 lb 9.6 oz)   SpO2: 94% 94% 97% 95%    Wt Readings from Last 3 Encounters:  06/13/15 82.373 kg (181 lb 9.6 oz)  05/15/15 87.726 kg (193 lb 6.4 oz)  04/28/15 82.7 kg (182 lb 5.1 oz)     Intake/Output Summary (Last 24 hours) at 06/13/15 1216 Last data filed at 06/13/15 1044  Gross per 24 hour  Intake    350 ml  Output      0 ml  Net    350 ml    Exam  General: Well developed, well nourished, NAD, appears stated age  HEENT: NCAT,mucous membranes moist.   Cardiovascular: S1 S2 auscultated,RRR, no murmurs  Respiratory: Diffuse exp wheezing  Abdomen: Soft, nontender, nondistended, + bowel sounds  Extremities: warm dry without cyanosis clubbing or edema  Neuro: AAOx3, nonfocal  Psych: Appropriate  Data Review   Micro Results No results found for this or any previous visit (from the past 240 hour(s)).  Radiology Reports X-ray Chest Pa And Lateral  06/12/2015  CLINICAL DATA:  75 year old with current history of COPD, presenting with acute onset of shortness of breath and chest congestion. EXAM: CHEST  2 VIEW COMPARISON:  Chest x-ray 04/28/2015, 11/15/1,014. CTA chest 04/29/2015. FINDINGS: Cardiac silhouette mildly enlarged, unchanged. Thoracic aorta tortuous, ectatic and mildly atherosclerotic, unchanged. Hilar and mediastinal contours otherwise unremarkable. Severe bullous emphysematous changes throughout both lungs. Moderate to severe central peribronchial thickening, more so than on the prior examination. New patchy airspace opacities in the right upper lobe. No pleural effusions. No pneumothorax. Osseous demineralization. Visualized bony thorax otherwise intact. IMPRESSION: 1. Acute pneumonia  involving the right upper lobe, superimposed upon moderate to severe changes of acute bronchitis and/or asthma. Baseline severe COPD/emphysema. 2. Stable mild cardiomegaly without evidence of pulmonary edema. Electronically Signed   By: Hulan Saas M.D.   On: 06/12/2015 22:13    CBC  Recent Labs Lab 06/12/15 1826 06/13/15 0615  WBC 4.5 5.7  HGB 12.5* 11.8*  HCT 38.4* 36.7*  PLT 152 162  MCV 85.7 86.2  MCH 27.9 27.7  MCHC 32.6 32.2  RDW 14.5 14.6    Chemistries   Recent Labs Lab 06/12/15 1826 06/13/15 0615  NA 141 142  K 3.9 4.3  CL 106 107  CO2 24 25  GLUCOSE 174* 149*  BUN 12 16  CREATININE 0.95 0.97  CALCIUM 9.2 9.0  AST 17  --   ALT 10*  --  ALKPHOS 152*  --   BILITOT 0.5  --    ------------------------------------------------------------------------------------------------------------------ estimated creatinine clearance is 65.9 mL/min (by C-G formula based on Cr of 0.97). ------------------------------------------------------------------------------------------------------------------ No results for input(s): HGBA1C in the last 72 hours. ------------------------------------------------------------------------------------------------------------------ No results for input(s): CHOL, HDL, LDLCALC, TRIG, CHOLHDL, LDLDIRECT in the last 72 hours. ------------------------------------------------------------------------------------------------------------------ No results for input(s): TSH, T4TOTAL, T3FREE, THYROIDAB in the last 72 hours.  Invalid input(s): FREET3 ------------------------------------------------------------------------------------------------------------------ No results for input(s): VITAMINB12, FOLATE, FERRITIN, TIBC, IRON, RETICCTPCT in the last 72 hours.  Coagulation profile No results for input(s): INR, PROTIME in the last 168 hours.  No results for input(s): DDIMER in the last 72 hours.  Cardiac Enzymes  Recent Labs Lab  06/12/15 1826 06/13/15 0018 06/13/15 0615  TROPONINI 0.31* 0.29* 0.25*   ------------------------------------------------------------------------------------------------------------------ Invalid input(s): POCBNP    Navaya Wiatrek D.O. on 06/13/2015 at 12:16 PM  Between 7am to 7pm - Pager - [phone]  After 7pm go to [email or web address] - password [email or web address]  And look for the night coverage person covering for me after hours  [facility] Office  [phone]

## 2015-06-13 NOTE — Evaluation (Signed)
Physical Therapy Evaluation Patient Details Name: Norman Davis MRN: [id number] DOB: [birth year] Today's Date: 06/13/2015   History of Present Illness  75 yo male with onset of PNA and COPD exacerbation, has elevated and declining troponins and SOB;  PMHx:  Pagets disease and THA RLE, cardiomegaly.    Clinical Impression  Pt was seen for determination of care needs and will have family support to go home after this trip to hospital. He will definitely need therapy follow up and will need to be monitored for vital signs with treatment.  O2 sats pre-gait were 98% and post were 94% with no SOB observed.Supplemental O2 used but nsg removed to sit at rest afterward.    Follow Up Recommendations Home health PT;Supervision/Assistance - 24 hour    Equipment Recommendations  None recommended by PT    Recommendations for Other Services       Precautions / Restrictions Precautions Precautions: Fall Restrictions Weight Bearing Restrictions: No      Mobility  Bed Mobility               General bed mobility comments: up in chair when PT entered  Transfers Overall transfer level: Needs assistance Equipment used: Rolling walker (2 wheeled);1 person hand held assist Transfers: Sit to/from [employer] Sit to Stand: Min guard;Min assist Stand pivot transfers: Min guard       General transfer comment: reminders fro hand placement and safety with transition   Ambulation/Gait Ambulation/Gait assistance: Min assist;Min guard (min assist to turn the walker, pt struggles to control it) Ambulation Distance (Feet): 200 Feet Assistive device: Rolling walker (2 wheeled);1 person hand held assist Gait Pattern/deviations: Step-through pattern;Wide base of support;Shuffle Gait velocity: reduced Gait velocity interpretation: Below normal speed for age/gender General Gait Details: slow pace and wide turns with pt needing to deliberately stop andcontrl the walker  Stairs             Wheelchair Mobility    Modified Rankin (Stroke Patients Only)       Balance Overall balance assessment: Needs assistance Sitting-balance support: Feet supported Sitting balance-Leahy Scale: Good   Postural control: Posterior lean Standing balance support: Bilateral upper extremity supported Standing balance-Leahy Scale: Fair                               Pertinent Vitals/Pain Pain Assessment: No/denies pain    Home Living Family/patient expects to be discharged to:: Private residence Living Arrangements: Other relatives Available Help at Discharge: Family;Available 24 hours/day Type of Home: House Home Access: Stairs to enter Entrance Stairs-Rails: None Entrance Stairs-Number of Steps: 4 Home Layout: Two level;Bed/bath upstairs Home Equipment: Walker - 2 wheels;Cane - single point;Shower seat Additional [facility]    Prior Function Level of Independence: Needs assistance   Gait / Transfers Assistance Needed: uses straight cane  ADL's / Homemaking Assistance Needed: assist with bathing and dressing; [facility]        Hand Dominance        Extremity/Trunk Assessment   Upper Extremity Assessment: Overall WFL for tasks assessed           Lower Extremity Assessment: Generalized weakness      Cervical / Trunk Assessment: Normal  Communication   Communication: No difficulties  Cognition Arousal/Alertness: Awake/alert Behavior During Therapy: WFL for tasks assessed/performed Overall Cognitive Status: Within Functional Limits for tasks  assessed                      General Comments General comments (skin integrity, edema, etc.): Pt is gettin up to walk with assistance and making slow progress due to his breathing and previous issues with RLE joints and use of SPC.    Exercises        Assessment/Plan    PT Assessment Patient needs  continued PT services  PT Diagnosis Generalized weakness;Difficulty walking   PT Problem List Decreased strength;Decreased range of motion;Decreased activity tolerance;Decreased balance;Decreased mobility;Decreased coordination;Decreased knowledge of use of DME;Decreased safety awareness;Cardiopulmonary status limiting activity  PT Treatment Interventions Gait training;DME instruction;Stair training;Functional mobility training;Therapeutic activities;Therapeutic exercise;Balance training;Neuromuscular re-education;Patient/family education   PT Goals (Current goals can be found in the Care Plan section) Acute Rehab PT Goals Patient Stated Goal: to get home PT Goal Formulation: With patient Time For Goal Achievement: 06/27/15 Potential to Achieve Goals: Good    Frequency Min 3X/week   Barriers to discharge Inaccessible home environment stairs to enter house and go upstairs    Co-evaluation               End of Session Equipment Utilized During Treatment: Gait belt;Oxygen Activity Tolerance: Patient tolerated treatment well Patient left: in chair;with call bell/phone within reach Nurse Communication: Mobility status         Time: 4098-1191 PT Time Calculation (min) (ACUTE ONLY): 22 min   Charges:   PT Evaluation $PT Eval Moderate Complexity: 1 Procedure     PT G CodesIvar Drape Jul 07, 2015, 1:17 PM   Samul Dada, PT MS Acute Rehab Dept. Number: [id number] and [facility] [phone]

## 2015-06-13 NOTE — Progress Notes (Signed)
Initial Nutrition Assessment  INTERVENTION:  Encourage meals and snacks.   NUTRITION DIAGNOSIS:   Unintentional weight loss related to poor appetite as evidenced by per patient/family report.  GOAL:   Patient will meet greater than or equal to 90% of their needs  MONITOR:   PO intake, Labs, I & O's  REASON FOR ASSESSMENT:   Consult COPD Protocol  ASSESSMENT:   Pt with hx of Schizoaffective d/o, HTN, and COPD admitted with SOB after cleaning his house with Clorox.   Medications reviewed and include: solumedrol Labs reviewed.  Nutrition-Focused physical exam completed. Findings are no fat depletion, no muscle depletion, and no edema.  Per pt he eats 3 meals per day. Breakfast: toast, jelly, oatmeal Lunch: hot meal at daycare 4 days per week Supper: at home, hot meal sister cooks Per pt he has had decreased appetite x 1 week PTA and has ate a little less at home at supper. Per pt he has had 15 lb weight loss x 1 week. Weight hx is 182-193 lb. Will monitor.  Meal Completion: 100%   Diet Order:  Diet Heart Room service appropriate?: Yes; Fluid consistency:: Thin  Skin:  Reviewed, no issues  Last BM:  2/9  Height:   Ht Readings from Last 1 Encounters:  06/12/15 5' 9.5" (1.765 m)   Weight:   Wt Readings from Last 1 Encounters:  06/13/15 181 lb 9.6 oz (82.373 kg)   Ideal Body Weight:  74 kg  BMI:  Body mass index is 26.44 kg/(m^2).  Estimated Nutritional Needs:   Kcal:  1900-2100  Protein:  95-110 grams  Fluid:  >1.9 L/day  EDUCATION NEEDS:   No education needs identified at this time  Kendell Bane RD, LDN, CNSC [phone] Pager [phone] After Hours Pager

## 2015-06-14 DIAGNOSIS — J441 Chronic obstructive pulmonary disease with (acute) exacerbation: Secondary | ICD-10-CM

## 2015-06-14 LAB — CBC
HCT: 36.8 % — ABNORMAL LOW (ref 39.0–52.0)
Hemoglobin: 11.6 g/dL — ABNORMAL LOW (ref 13.0–17.0)
MCH: 27.2 pg (ref 26.0–34.0)
MCHC: 31.5 g/dL (ref 30.0–36.0)
MCV: 86.2 fL (ref 78.0–100.0)
PLATELETS: 167 10*3/uL (ref 150–400)
RBC: 4.27 MIL/uL (ref 4.22–5.81)
RDW: 14.9 % (ref 11.5–15.5)
WBC: 8.3 10*3/uL (ref 4.0–10.5)

## 2015-06-14 LAB — BASIC METABOLIC PANEL
Anion gap: 7 (ref 5–15)
BUN: 20 mg/dL (ref 6–20)
CALCIUM: 9 mg/dL (ref 8.9–10.3)
CHLORIDE: 109 mmol/L (ref 101–111)
CO2: 25 mmol/L (ref 22–32)
CREATININE: 1.16 mg/dL (ref 0.61–1.24)
GFR calc Af Amer: >60 mL/min (ref >60)
GFR, EST NON AFRICAN AMERICAN: 59 mL/min — AB (ref >60)
Glucose, Bld: 165 mg/dL — ABNORMAL HIGH (ref 65–99)
Potassium: 4 mmol/L (ref 3.5–5.1)
SODIUM: 141 mmol/L (ref 135–145)

## 2015-06-14 LAB — TROPONIN I: TROPONIN I: 0.22 ng/mL — AB (ref <0.031)

## 2015-06-14 MED ORDER — LEVOFLOXACIN 750 MG PO TABS
750.0000 mg | ORAL_TABLET | Freq: Every day | ORAL | Status: DC
  Administered 2015-06-15: 750 mg via ORAL
  Filled 2015-06-14: qty 1

## 2015-06-14 MED ORDER — DM-GUAIFENESIN ER 30-600 MG PO TB12
1.0000 | ORAL_TABLET | Freq: Two times a day (BID) | ORAL | Status: DC
  Administered 2015-06-14 – 2015-06-15 (×3): 1 via ORAL
  Filled 2015-06-14 (×3): qty 1

## 2015-06-14 MED ORDER — BISACODYL 5 MG PO TBEC
5.0000 mg | DELAYED_RELEASE_TABLET | Freq: Every day | ORAL | Status: DC | PRN
  Administered 2015-06-14: 5 mg via ORAL
  Filled 2015-06-14 (×2): qty 1

## 2015-06-14 NOTE — Progress Notes (Signed)
Utilization review completed.  

## 2015-06-14 NOTE — Progress Notes (Signed)
[facility]                                                                              Patient Demographics  Norman Davis, is a 75 y.o. male, DOB - 08-14-1940, ZOX:[id number]  Admit date - 06/12/2015   Admitting Physician Starleen Arms, MD  Outpatient Primary MD for the patient is Pcp Not In System  LOS - 2   No chief complaint on file.     HPI on 06/12/2015 Norman Davis is a 75 y.o. male with a history of hypertension, hyperlipidemia, schizoaffective disorder, presented to the emergency department in [place] with complaints of shortness of breath. Patient shortness of breath started yesterday. He has been in no contact with persons, no recent travel. He has received his flu shot this year. Patient states he had finished cleaning yesterday and was using Clorox. He began to have lots of wheezing and shortness of breath with no coughing. Her sister via phone, patient does have history of emphysema and sees physicians at the [place]. Patient no longer smokes. Currently he denies any cough, fever, chills, chest pain, abdominal pain, nausea or vomiting. He does endorse some hesitancy with urination. Patient was transferred to [facility] for COPD exacerbation.  Assessment & Plan   Acute COPD exacerbation -Patient transferred from [facility] ER to [facility] -Likely secondary to chemical inhalation -Continue supplemental oxygen to maintain saturations above 92% -Placed on Solu-Medrol, nebs, continue Spiriva -Patient states he started coughing last night, will start Mucinex  Community Acquired pneumonia -CXR: Acute pneumonia involving the right upper lobe, superimposed on moderate to severe changes of acute bronchitis and/or asthma, baseline severe COPD/emphysema -Started pneumonia order set -Pending blood cultures -strep pneumonia urine Antigen negtative -Placed on levaquin as patient has PCN allergy  -Currently afrebile with no leukocytosis   Elevated troponin -Troponin was mildly  elevated at previous hospital, 0.114 -After reviewing records here, it appears the patient has chronically elevated troponin -Troponin peak 0.31, however, trending downward -Continue Plavix  Hypertension -Continue Cardura, Proscar, amlodipine  Urinary retention/BPH -Continue Cardura, Proscar, myrbetriq  Hyperlipidemia -Continue statin  GERD -Continue PPI  Depression/schizophrenia/questionable insomnia -Continue Prozac, Remeron  Physical deconditioning -PT consulted, rec HHPT  Code Status: Full  Family Communication: None at bedside.  Disposition Plan: Admitted. Pending improvement in respiratory symptoms  Time Spent in minutes   30 minutes  Procedures  None  Consults   None  DVT Prophylaxis  Lovenox  Lab Results  Component Value Date   PLT 167 06/14/2015    Medications  Scheduled Meds: . amLODipine  10 mg Oral Daily  . clopidogrel  75 mg Oral Q breakfast  . doxazosin  8 mg Oral QHS  . enoxaparin (LOVENOX) injection  40 mg Subcutaneous Q24H  . finasteride  5 mg Oral Daily  . FLUoxetine  40 mg Oral Daily  . ipratropium-albuterol  3 mL Nebulization Q6H  . levofloxacin (LEVAQUIN) IV  750 mg Intravenous Q24H  . loratadine  10 mg Oral Daily  . methylPREDNISolone (SOLU-MEDROL) injection  60 mg Intravenous Q12H  . mirabegron ER  25 mg Oral Daily  . mirtazapine  30 mg Oral QHS  . pantoprazole  40 mg Oral Daily  .  pravastatin  40 mg Oral QHS  . senna  1 tablet Oral Daily  . sodium chloride flush  3 mL Intravenous Q12H  . sodium chloride flush  3 mL Intravenous Q12H   Continuous Infusions:  PRN Meds:.sodium chloride, acetaminophen **OR** acetaminophen, ipratropium-albuterol, morphine injection, ondansetron **OR** ondansetron (ZOFRAN) IV, sodium chloride flush  Antibiotics    Anti-infectives    Start     Dose/Rate Route Frequency Ordered Stop   06/13/15 0800  levofloxacin (LEVAQUIN) IVPB 750 mg     750 mg 100 mL/hr over 90 Minutes Intravenous Every 24  hours 06/13/15 0700 06/18/15 0759      Subjective:   Norman Davis seen and examined today.  Patient complains of cough today.  Denies chest pain, abdominal pain, headache, dizziness.    Objective:   Filed Vitals:   06/13/15 2354 06/14/15 0117 06/14/15 0630 06/14/15 0756  BP: 138/96  149/96   Pulse: 92  95   Temp: 98.6 F (37 C)  98.2 F (36.8 C)   TempSrc: Oral  Oral   Resp: 16  18   Height:      Weight:   84.369 kg (186 lb)   SpO2: 94% 97% 94% 94%    Wt Readings from Last 3 Encounters:  06/14/15 84.369 kg (186 lb)  05/15/15 87.726 kg (193 lb 6.4 oz)  04/28/15 82.7 kg (182 lb 5.1 oz)     Intake/Output Summary (Last 24 hours) at 06/14/15 1055 Last data filed at 06/14/15 1000  Gross per 24 hour  Intake    720 ml  Output    975 ml  Net   -255 ml    Exam  General: Well developed, well nourished, NAD  HEENT: NCAT,mucous membranes moist.   Cardiovascular: S1 S2 auscultated,RRR, no murmurs  Respiratory: Diminished breath sounds, no wheezing today  Abdomen: Soft, nontender, nondistended, + bowel sounds  Extremities: warm dry without cyanosis clubbing or edema  Neuro: AAOx3, nonfocal  Psych: Flat affect  Data Review   Micro Results No results found for this or any previous visit (from the past 240 hour(s)).  Radiology Reports X-ray Chest Pa And Lateral  06/12/2015  CLINICAL DATA:  75 year old with current history of COPD, presenting with acute onset of shortness of breath and chest congestion. EXAM: CHEST  2 VIEW COMPARISON:  Chest x-ray 04/28/2015, 11/15/1,014. CTA chest 04/29/2015. FINDINGS: Cardiac silhouette mildly enlarged, unchanged. Thoracic aorta tortuous, ectatic and mildly atherosclerotic, unchanged. Hilar and mediastinal contours otherwise unremarkable. Severe bullous emphysematous changes throughout both lungs. Moderate to severe central peribronchial thickening, more so than on the prior examination. New patchy airspace opacities in the right upper  lobe. No pleural effusions. No pneumothorax. Osseous demineralization. Visualized bony thorax otherwise intact. IMPRESSION: 1. Acute pneumonia involving the right upper lobe, superimposed upon moderate to severe changes of acute bronchitis and/or asthma. Baseline severe COPD/emphysema. 2. Stable mild cardiomegaly without evidence of pulmonary edema. Electronically Signed   By: Hulan Saas M.D.   On: 06/12/2015 22:13    CBC  Recent Labs Lab 06/12/15 1826 06/13/15 0615 06/14/15 0217  WBC 4.5 5.7 8.3  HGB 12.5* 11.8* 11.6*  HCT 38.4* 36.7* 36.8*  PLT 152 162 167  MCV 85.7 86.2 86.2  MCH 27.9 27.7 27.2  MCHC 32.6 32.2 31.5  RDW 14.5 14.6 14.9    Chemistries   Recent Labs Lab 06/12/15 1826 06/13/15 0615 06/14/15 0217  NA 141 142 141  K 3.9 4.3 4.0  CL 106 107 109  CO2 24  25 25  GLUCOSE 174* 149* 165*  BUN CREATININE 0.95 0.97 1.16  CALCIUM 9.2 9.0 9.0  AST 17  --   --   ALT 10*  --   --   ALKPHOS 152*  --   --   BILITOT 0.5  --   --    ------------------------------------------------------------------------------------------------------------------ estimated creatinine clearance is 55.1 mL/min (by C-G formula based on Cr of 1.16). ------------------------------------------------------------------------------------------------------------------ No results for input(s): HGBA1C in the last 72 hours. ------------------------------------------------------------------------------------------------------------------ No results for input(s): CHOL, HDL, LDLCALC, TRIG, CHOLHDL, LDLDIRECT in the last 72 hours. ------------------------------------------------------------------------------------------------------------------ No results for input(s): TSH, T4TOTAL, T3FREE, THYROIDAB in the last 72 hours.  Invalid input(s): FREET3 ------------------------------------------------------------------------------------------------------------------ No results for input(s):  VITAMINB12, FOLATE, FERRITIN, TIBC, IRON, RETICCTPCT in the last 72 hours.  Coagulation profile No results for input(s): INR, PROTIME in the last 168 hours.  No results for input(s): DDIMER in the last 72 hours.  Cardiac Enzymes  Recent Labs Lab 06/13/15 0018 06/13/15 0615 06/14/15 0217  TROPONINI 0.29* 0.25* 0.22*   ------------------------------------------------------------------------------------------------------------------ Invalid input(s): POCBNP    Lynwood Kubisiak D.O. on 06/14/2015 at 10:55 AM  Between 7am to 7pm - Pager - [phone]  After 7pm go to [email or web address] - password [email or web address]  And look for the night coverage person covering for me after hours  [facility] Office  [phone]

## 2015-06-15 LAB — TROPONIN I: Troponin I: 0.13 ng/mL — ABNORMAL HIGH (ref <0.031)

## 2015-06-15 MED ORDER — PREDNISONE 10 MG PO TABS: ORAL_TABLET | ORAL | Status: DC

## 2015-06-15 MED ORDER — LEVOFLOXACIN 750 MG PO TABS: 750.0000 mg | ORAL_TABLET | Freq: Every day | ORAL | Status: DC

## 2015-06-15 MED ORDER — DM-GUAIFENESIN ER 30-600 MG PO TB12: 1.0000 | ORAL_TABLET | Freq: Two times a day (BID) | ORAL | Status: DC

## 2015-06-15 NOTE — Progress Notes (Signed)
Occupational Therapy Evaluation Patient Details Name: Norman Davis MRN: [id number] DOB: February 11, 1941 Today's Date: 06/15/2015    History of Present Illness 75 yo male with onset of PNA and COPD exacerbation, has elevated and declining troponins and SOB;  PMHx:  Pagets disease and THA RLE, cardiomegaly.     Clinical Impression   PTA, pt states he was mod I with mobility and ADL. Pt presents with a decline in functional status and would benefit from [facility]. Recommend use of 3 in 1 as pt is unable to transfer off standard height toilet independently. Also recommended for pt to use a tub bench in order to reduce risk of falls at home. Anticipate D/C today. All further Ot to be addressed by HHOT. Contacted CM regarding D/C recommendations.     Follow Up Recommendations  Home health OT;Supervision/Assistance - 24 hour    Equipment Recommendations  3 in 1 bedside comode;Tub/shower bench    Recommendations for Other Services       Precautions / Restrictions Precautions Precautions: Fall Restrictions Weight Bearing Restrictions: No      Mobility Bed Mobility          mod I  - use of rails       Transfers Overall transfer level: Needs assistance Equipment used: Rolling walker (2 wheeled)   Sit to Stand: Min assist Stand pivot transfers: Supervision       General transfer comment: from lower surface    Balance     Sitting balance-Leahy Scale: Good       Standing balance-Leahy Scale: Fair                              ADL Overall ADL's : Needs assistance/impaired     Grooming: Set up;Supervision/safety;Standing   Upper Body Bathing: Set up;Supervision/ safety;Sitting   Lower Body Bathing: Minimal assistance;Sit to/from stand   Upper Body Dressing : Supervision/safety;Set up;Sitting   Lower Body Dressing: Minimal assistance;Sit to/from stand   Toilet Transfer: Regular Toilet;Ambulation;RW;Minimal assistance Toilet Transfer Details (indicate cue  type and reason): Pt with uncontrolled descent onto toilet. Unable to complete sit - stand off standard toilet without min A to power up.  Toileting- Clothing Manipulation and Hygiene: Supervision/safety;Sitting/lateral lean       Functional mobility during ADLs: Supervision/safety;Rolling walker       Vision     Perception     Praxis      Pertinent Vitals/Pain Pain Assessment: No/denies pain     Hand Dominance     Extremity/Trunk Assessment Upper Extremity Assessment Upper Extremity Assessment: Generalized weakness   Lower Extremity Assessment Lower Extremity Assessment: Generalized weakness   Cervical / Trunk Assessment Cervical / Trunk Assessment: Normal   Communication Communication Communication: No difficulties   Cognition Arousal/Alertness: Awake/alert Behavior During Therapy: WFL for tasks assessed/performed Overall Cognitive Status: No family/caregiver present to determine baseline cognitive functioning                     General Comments       Exercises Exercises: Other exercises (LE strength was 4 to 5 BLE's (4 hipabd and knee flex))     Shoulder Instructions      Home Living Family/patient expects to be discharged to:: Private residence Living Arrangements: Other relatives Available Help at Discharge: Family;Available 24 hours/day Type of Home: House Home Access: Stairs to enter [employer] of Steps: 4 Entrance Stairs-Rails: None Home Layout: Two level;Bed/bath upstairs Alternate Level  Stairs-Number of Steps: 16 Alternate Level Stairs-Rails: Right;Left;Can reach both Bathroom Shower/Tub: Chief Strategy Officer: Standard     Home Equipment: Environmental consultant - 2 wheels;Cane - single point;Shower seat   Additional [facility]      Prior Functioning/Environment Level of Independence: Needs assistance  Gait / Transfers Assistance Needed: uses straight cane ADL's /  Homemaking Assistance Needed: Pt states he does his own bathing and dressing. Per PT he has assist?        OT Diagnosis: Generalized weakness   OT Problem List: Decreased strength;Decreased activity tolerance;Impaired balance (sitting and/or standing);Decreased safety awareness;Decreased knowledge of use of DME or AE;Obesity   OT Treatment/Interventions:      OT Goals(Current goals can be found in the care plan section) Acute Rehab OT Goals Patient Stated Goal: to get home OT Goal Formulation: All assessment and education complete, DC therapy Potential to Achieve Goals: Good  OT Frequency:     Barriers to D/C:            Co-evaluation              End of Session Equipment Utilized During Treatment: Gait belt;Rolling walker Nurse Communication: Mobility status  Activity Tolerance: Patient tolerated treatment well Patient left: in bed;with call bell/phone within reach;with bed alarm set   Time: 1610-9604 OT Time Calculation (min): 18 min Charges:  OT General Charges $OT Visit: 1 Procedure OT Evaluation $OT Eval Moderate Complexity: 1 Procedure G-Codes:    Isbella Arline,HILLARY 06-30-15, 2:01 PM   [facility], OTR/L  [phone] 06-30-2015

## 2015-06-15 NOTE — Discharge Summary (Addendum)
Physician Discharge Summary  [name] ZOX:[id number] DOB: February 27, 1941 DOA: 06/12/2015  PCP: Pcp Not In System  Admit date: 06/12/2015 Discharge date: 06/15/2015  Time spent: 45 minutes  Recommendations for Outpatient Follow-up:  Patient will be discharged to home with home health PT and OT.  Patient will need to follow up with primary care provider within one week of discharge.  Patient should continue medications as prescribed.  Patient should follow a heart healthy diet.   Discharge Diagnoses:  Acute COPD exacerbation Community-acquired pneumonia Elevated troponin Hypertension Urinary retention/BPH Hyperlipidemia GERD Depression/schizophrenia/insomnia Physical deconditioning  Discharge Condition: Stable  Diet recommendation: heart healthy  Filed Weights   06/13/15 0519 06/14/15 0630 06/15/15 0404  Weight: 82.373 kg (181 lb 9.6 oz) 84.369 kg (186 lb) 85.639 kg (188 lb 12.8 oz)    History of present illness:  on 06/12/2015 Norman Davis is a 75 y.o. male with a history of hypertension, hyperlipidemia, schizoaffective disorder, presented to the emergency department in [place] with complaints of shortness of breath. Patient shortness of breath started yesterday. He has been in no contact with persons, no recent travel. He has received his flu shot this year. Patient states he had finished cleaning yesterday and was using Clorox. He began to have lots of wheezing and shortness of breath with no coughing. Her sister via phone, patient does have history of emphysema and sees physicians at the [place]. Patient no longer smokes. Currently he denies any cough, fever, chills, chest pain, abdominal pain, nausea or vomiting. He does endorse some hesitancy with urination. Patient was transferred to [facility] for COPD exacerbation.  Hospital Course:  Acute COPD exacerbation -Improving -Patient transferred from [facility] ER to [facility] -Likely secondary to chemical inhalation -Weaned off of  supplemental O2, maintaining sats in the mid to high 90s -Initially placed on Solu-Medrol, nebs, Spiriva, mucinex  Community Acquired pneumonia -CXR: Acute pneumonia involving the right upper lobe, superimposed on moderate to severe changes of acute bronchitis and/or asthma, baseline severe COPD/emphysema -Started pneumonia order set -Blood cultures negative to date -strep pneumonia urine Antigen negtative -Placed on levaquin as patient has PCN allergy - continue for additional 4 days -Currently afrebile with no leukocytosis   Elevated troponin -Troponin was mildly elevated at previous hospital, 0.114 -After reviewing records here, it appears the patient has chronically elevated troponin -Troponin peak 0.31, however, trending downward 0.13 -Continue Plavix  Hypertension -Continue Cardura, Proscar, amlodipine  Urinary retention/BPH -Continue Cardura, Proscar, myrbetriq  Hyperlipidemia -Continue statin  GERD -Continue PPI  Depression/schizophrenia/questionable insomnia -Continue Prozac, Remeron  Physical deconditioning -PT consulted, rec HHPT  Procedures  None  Consults  None  Discharge Exam: Filed Vitals:   06/15/15 0404 06/15/15 0800  BP: 148/90 159/95  Pulse: 91 102  Temp: 98.9 F (37.2 C) 99 F (37.2 C)  Resp: 18 18   Exam  General: Well developed, well nourished, NAD  HEENT: NCAT,mucous membranes moist.   Cardiovascular: S1 S2 auscultated,RRR, no murmurs  Respiratory: Diminished breath sounds, no wheezing  Abdomen: Soft, nontender, nondistended, + bowel sounds  Extremities: warm dry without cyanosis clubbing or edema  Neuro: AAOx3, nonfocal  Psych: Appropriate  Discharge Instructions      Discharge Instructions    Discharge instructions    Complete by:  As directed   Patient will be discharged to home with home health PT.  Patient will need to follow up with primary care provider within one week of discharge.  Patient should continue  medications as prescribed.  Patient should follow a heart  healthy diet.            Medication List    STOP taking these medications        carvedilol 3.125 MG tablet  Commonly known as:  COREG      TAKE these medications        albuterol 108 (90 Base) MCG/ACT inhaler  Commonly known as:  PROVENTIL HFA;VENTOLIN HFA  Inhale 2 puffs into the lungs every 4 (four) hours as needed for wheezing or shortness of breath.     amLODipine 10 MG tablet  Commonly known as:  NORVASC  Take 10 mg by mouth daily.     cetirizine 10 MG tablet  Commonly known as:  ZYRTEC  Take 10 mg by mouth at bedtime.     clopidogrel 75 MG tablet  Commonly known as:  PLAVIX  Take 75 mg by mouth daily with breakfast.     dextromethorphan-guaiFENesin 30-600 MG 12hr tablet  Commonly known as:  MUCINEX DM  Take 1 tablet by mouth 2 (two) times daily.     doxazosin 8 MG tablet  Commonly known as:  CARDURA  Take 8 mg by mouth at bedtime.     finasteride 5 MG tablet  Commonly known as:  PROSCAR  Take 5 mg by mouth daily.     FLUoxetine 20 MG capsule  Commonly known as:  PROZAC  Take 40 mg by mouth daily.     levofloxacin 750 MG tablet  Commonly known as:  LEVAQUIN  Take 1 tablet (750 mg total) by mouth daily.     meloxicam 7.5 MG tablet  Commonly known as:  MOBIC  Take 7.5 mg by mouth daily.     mirtazapine 30 MG tablet  Commonly known as:  REMERON  Take 30 mg by mouth at bedtime.     MYRBETRIQ PO  Take 25 mg by mouth daily.     oxyCODONE-acetaminophen 5-325 MG tablet  Commonly known as:  PERCOCET/ROXICET  Take 1 tablet by mouth every 4 (four) hours as needed for moderate pain.     pantoprazole 40 MG tablet  Commonly known as:  PROTONIX  Take 40 mg by mouth daily.     pravastatin 40 MG tablet  Commonly known as:  PRAVACHOL  Take 40 mg by mouth at bedtime.     predniSONE 10 MG tablet  Commonly known as:  DELTASONE  Take  (4 tabs) x 3 days, then taper to  (3 tabs) x 3 days, then   (2 tabs) x 3days, then  (1 tab) x 3days, then OFF.     senna 8.6 MG Tabs tablet  Commonly known as:  SENOKOT  Take 1 tablet by mouth daily.     tiotropium 18 MCG inhalation capsule  Commonly known as:  SPIRIVA  Place 18 mcg into inhaler and inhale daily.       Allergies  Allergen Reactions  . Aspirin     unknown  . Penicillins     Unknown Has patient had a PCN reaction causing immediate rash, facial/tongue/throat swelling, SOB or lightheadedness with hypotension: NO Has patient had a PCN reaction causing severe rash involving mucus membranes or skin necrosis: NO Has patient had a PCN reaction that required hospitalization NO Has patient had a PCN reaction occurring within the last 10 years: NO If all of the above answers are "NO", then may proceed with Cephalosporin use.   Follow-up Information    Follow up with Primary care physician at the [facility]. Schedule  an appointment as soon as possible for a visit in 1 week.   Why:  Hospital follow up       The results of significant diagnostics from this hospitalization (including imaging, microbiology, ancillary and laboratory) are listed below for reference.    Significant Diagnostic Studies: X-ray Chest Pa And Lateral  06/12/2015  CLINICAL DATA:  75 year old with current history of COPD, presenting with acute onset of shortness of breath and chest congestion. EXAM: CHEST  2 VIEW COMPARISON:  Chest x-ray 04/28/2015, 11/15/1,014. CTA chest 04/29/2015. FINDINGS: Cardiac silhouette mildly enlarged, unchanged. Thoracic aorta tortuous, ectatic and mildly atherosclerotic, unchanged. Hilar and mediastinal contours otherwise unremarkable. Severe bullous emphysematous changes throughout both lungs. Moderate to severe central peribronchial thickening, more so than on the prior examination. New patchy airspace opacities in the right upper lobe. No pleural effusions. No pneumothorax. Osseous demineralization. Visualized bony thorax otherwise  intact. IMPRESSION: 1. Acute pneumonia involving the right upper lobe, superimposed upon moderate to severe changes of acute bronchitis and/or asthma. Baseline severe COPD/emphysema. 2. Stable mild cardiomegaly without evidence of pulmonary edema. Electronically Signed   By: Hulan Saas M.D.   On: 06/12/2015 22:13    Microbiology: Recent Results (from the past 240 hour(s))  Culture, blood (routine x 2) Call MD if unable to obtain prior to antibiotics being given     Status: None (Preliminary result)   Collection Time: 06/13/15  7:45 AM  Result Value Ref Range Status   Specimen Description BLOOD RIGHT ANTECUBITAL  Final   Special Requests BOTTLES DRAWN AEROBIC AND ANAEROBIC 10CC  Final   Culture NO GROWTH 1 DAY  Final   Report Status PENDING  Incomplete  Culture, blood (routine x 2) Call MD if unable to obtain prior to antibiotics being given     Status: None (Preliminary result)   Collection Time: 06/13/15  8:00 AM  Result Value Ref Range Status   Specimen Description BLOOD LEFT ANTECUBITAL  Final   Special Requests BOTTLES DRAWN AEROBIC AND ANAEROBIC 10CC  Final   Culture NO GROWTH 1 DAY  Final   Report Status PENDING  Incomplete     Labs: Basic Metabolic Panel:  Recent Labs Lab 06/12/15 1826 06/13/15 0615 06/14/15 0217  NA 141 142 141  K 3.9 4.3 4.0  CL 106 107 109  CO2 GLUCOSE 174* 149* 165*  BUN CREATININE 0.95 0.97 1.16  CALCIUM 9.2 9.0 9.0   Liver Function Tests:  Recent Labs Lab 06/12/15 1826  AST 17  ALT 10*  ALKPHOS 152*  BILITOT 0.5  PROT 6.5  ALBUMIN 3.3*   No results for input(s): LIPASE, AMYLASE in the last 168 hours. No results for input(s): AMMONIA in the last 168 hours. CBC:  Recent Labs Lab 06/12/15 1826 06/13/15 0615 06/14/15 0217  WBC 4.5 5.7 8.3  HGB 12.5* 11.8* 11.6*  HCT 38.4* 36.7* 36.8*  MCV 85.7 86.2 86.2  PLT 152 162 167   Cardiac Enzymes:  Recent Labs Lab 06/12/15 1826 06/13/15 0018  06/13/15 0615 06/14/15 0217 06/15/15 0327  TROPONINI 0.31* 0.29* 0.25* 0.22* 0.13*   BNP: BNP (last 3 results)  Recent Labs  04/28/15 1838  BNP 23.1    ProBNP (last 3 results) No results for input(s): PROBNP in the last 8760 hours.  CBG: No results for input(s): GLUCAP in the last 168 hours.     SignedEdsel Petrin  [facility] 06/15/2015, 10:17 AM

## 2015-06-15 NOTE — Care Management Important Message (Signed)
Important Message  Patient Details  Name: Norman Davis MRN: [id number] Date of Birth: 09-03-1940   Medicare Important Message Given:  Yes    Oralia Rud Airi Copado 06/15/2015, 1:34 PM

## 2015-06-15 NOTE — Progress Notes (Signed)
Discharge instruction and prescription given to pt and sister .verbalized understanding. [employer] by NT

## 2015-06-15 NOTE — Discharge Instructions (Signed)
Chronic Obstructive Pulmonary Disease °Chronic obstructive pulmonary disease (COPD) is a common lung condition in which airflow from the lungs is limited. COPD is a general term that can be used to describe many different lung problems that limit airflow, including both chronic bronchitis and emphysema. If you have COPD, your lung function will probably never return to normal, but there are measures you can take to improve lung function and make yourself feel better. °CAUSES  °· Smoking (common). °· Exposure to secondhand smoke. °· Genetic problems. °· Chronic inflammatory lung diseases or recurrent infections. °SYMPTOMS °· Shortness of breath, especially with physical activity. °· Deep, persistent (chronic) cough with a large amount of thick mucus. °· Wheezing. °· Rapid breaths (tachypnea). °· Gray or bluish discoloration (cyanosis) of the skin, especially in your fingers, toes, or lips. °· Fatigue. °· Weight loss. °· Frequent infections or episodes when breathing symptoms become much worse (exacerbations). °· Chest tightness. °DIAGNOSIS °Your health care provider will take a medical history and perform a physical examination to diagnose COPD. Additional tests for COPD may include: °· Lung (pulmonary) function tests. °· Chest X-ray. °· CT scan. °· Blood tests. °TREATMENT  °Treatment for COPD may include: °· Inhaler and nebulizer medicines. These help manage the symptoms of COPD and make your breathing more comfortable. °· Supplemental oxygen. Supplemental oxygen is only helpful if you have a low oxygen level in your blood. °· Exercise and physical activity. These are beneficial for nearly all people with COPD. °· Lung surgery or transplant. °· Nutrition therapy to gain weight, if you are underweight. °· Pulmonary rehabilitation. This may involve working with a team of health care providers and specialists, such as respiratory, occupational, and physical therapists. °HOME CARE INSTRUCTIONS °· Take all medicines  (inhaled or pills) as directed by your health care provider. °· Avoid over-the-counter medicines or cough syrups that dry up your airway (such as antihistamines) and slow down the elimination of secretions unless instructed otherwise by your health care provider. °· If you are a smoker, the most important thing that you can do is stop smoking. Continuing to smoke will cause further lung damage and breathing trouble. Ask your health care provider for help with quitting smoking. He or she can direct you to community resources or hospitals that provide support. °· Avoid exposure to irritants such as smoke, chemicals, and fumes that aggravate your breathing. °· Use oxygen therapy and pulmonary rehabilitation if directed by your health care provider. If you require home oxygen therapy, ask your health care provider whether you should purchase a pulse oximeter to measure your oxygen level at home. °· Avoid contact with individuals who have a contagious illness. °· Avoid extreme temperature and humidity changes. °· Eat healthy foods. Eating smaller, more frequent meals and resting before meals may help you maintain your strength. °· Stay active, but balance activity with periods of rest. Exercise and physical activity will help you maintain your ability to do things you want to do. °· Preventing infection and hospitalization is very important when you have COPD. Make sure to receive all the vaccines your health care provider recommends, especially the pneumococcal and influenza vaccines. Ask your health care provider whether you need a pneumonia vaccine. °· Learn and use relaxation techniques to manage stress. °· Learn and use controlled breathing techniques as directed by your health care provider. Controlled breathing techniques include: °¨ Pursed lip breathing. Start by breathing in (inhaling) through your nose for 1 second. Then, purse your lips as if you were   going to whistle and breathe out (exhale) through the  pursed lips for 2 seconds. °¨ Diaphragmatic breathing. Start by putting one hand on your abdomen just above your waist. Inhale slowly through your nose. The hand on your abdomen should move out. Then purse your lips and exhale slowly. You should be able to feel the hand on your abdomen moving in as you exhale. °· Learn and use controlled coughing to clear mucus from your lungs. Controlled coughing is a series of short, progressive coughs. The steps of controlled coughing are: °1. Lean your head slightly forward. °2. Breathe in deeply using diaphragmatic breathing. °3. Try to hold your breath for 3 seconds. °4. Keep your mouth slightly open while coughing twice. °5. Spit any mucus out into a tissue. °6. Rest and repeat the steps once or twice as needed. °SEEK MEDICAL CARE IF: °· You are coughing up more mucus than usual. °· There is a change in the color or thickness of your mucus. °· Your breathing is more labored than usual. °· Your breathing is faster than usual. °SEEK IMMEDIATE MEDICAL CARE IF: °· You have shortness of breath while you are resting. °· You have shortness of breath that prevents you from: °¨ Being able to talk. °¨ Performing your usual physical activities. °· You have chest pain lasting longer than 5 minutes. °· Your skin color is more cyanotic than usual. °· You measure low oxygen saturations for longer than 5 minutes with a pulse oximeter. °MAKE SURE YOU: °· Understand these instructions. °· Will watch your condition. °· Will get help right away if you are not doing well or get worse. °  °This information is not intended to replace advice given to you by your health care provider. Make sure you discuss any questions you have with your health care provider. °  °Document Released: 01/26/2005 Document Revised: 05/09/2014 Document Reviewed: 12/13/2012 °Elsevier Interactive Patient Education ©2016 [employer] °Community-Acquired Pneumonia, Adult °Pneumonia is an infection of the lungs. There are  different types of pneumonia. One type can develop while a person is in a hospital. A different type, called community-acquired pneumonia, develops in people who are not, or have not recently been, in the hospital or other health care facility.  °CAUSES °Pneumonia may be caused by bacteria, viruses, or funguses. Community-acquired pneumonia is often caused by Streptococcus pneumonia bacteria. These bacteria are often passed from one person to another by breathing in droplets from the cough or sneeze of an infected person. °RISK FACTORS °The condition is more likely to develop in: °· People who have chronic diseases, such as chronic obstructive pulmonary disease (COPD), asthma, congestive heart failure, cystic fibrosis, diabetes, or kidney disease. °· People who have early-stage or late-stage HIV. °· People who have sickle cell disease. °· People who have had their spleen removed (splenectomy). °· People who have poor dental hygiene. °· People who have medical conditions that increase the risk of breathing in (aspirating) secretions their own mouth and nose.   °· People who have a weakened immune system (immunocompromised). °· People who smoke. °· People who travel to areas where pneumonia-causing germs commonly exist. °· People who are around animal habitats or animals that have pneumonia-causing germs, including birds, bats, rabbits, cats, and farm animals. °SYMPTOMS °Symptoms of this condition include: °· A dry cough. °· A wet (productive) cough. °· Fever. °· Sweating. °· Chest pain, especially when breathing deeply or coughing. °· Rapid breathing or difficulty breathing. °· Shortness of breath. °· Shaking chills. °· Fatigue. °· Muscle aches. °DIAGNOSIS °Your health care provider will take a   medical history and perform a physical exam. You may also have other tests, including: °· Imaging studies of your chest, including X-rays. °· Tests to check your blood oxygen level and other blood gases. °· Other tests on  blood, mucus (sputum), fluid around your lungs (pleural fluid), and urine. °If your pneumonia is severe, other tests may be done to identify the specific cause of your illness. °TREATMENT °The type of treatment that you receive depends on many factors, such as the cause of your pneumonia, the medicines you take, and other medical conditions that you have. For most adults, treatment and recovery from pneumonia may occur at home. In some cases, treatment must happen in a hospital. Treatment may include: °· Antibiotic medicines, if the pneumonia was caused by bacteria. °· Antiviral medicines, if the pneumonia was caused by a virus. °· Medicines that are given by mouth or through an IV tube. °· Oxygen. °· Respiratory therapy. °Although rare, treating severe pneumonia may include: °· Mechanical ventilation. This is done if you are not breathing well on your own and you cannot maintain a safe blood oxygen level. °· Thoracentesis. This procedure removes fluid around one lung or both lungs to help you breathe better. °HOME CARE INSTRUCTIONS °7. Take over-the-counter and prescription medicines only as told by your health care provider. °1. Only take cough medicine if you are losing sleep. Understand that cough medicine can prevent your body's natural ability to remove mucus from your lungs. °2. If you were prescribed an antibiotic medicine, take it as told by your health care provider. Do not stop taking the antibiotic even if you start to feel better. °8. Sleep in a semi-upright position at night. Try sleeping in a reclining chair, or place a few pillows under your head. °9. Do not use tobacco products, including cigarettes, chewing tobacco, and e-cigarettes. If you need help quitting, ask your health care provider. °10. Drink enough water to keep your urine clear or pale yellow. This will help to thin out mucus secretions in your lungs. °PREVENTION °There are ways that you can decrease your risk of developing  community-acquired pneumonia. Consider getting a pneumococcal vaccine if: °· You are older than 77 years of age. °· You are older than 77 years of age and are undergoing cancer treatment, have chronic lung disease, or have other medical conditions that affect your immune system. Ask your health care provider if this applies to you. °There are different types and schedules of pneumococcal vaccines. Ask your health care provider which vaccination option is best for you. °You may also prevent community-acquired pneumonia if you take these actions: °· Get an influenza vaccine every year. Ask your health care provider which type of influenza vaccine is best for you. °· Go to the dentist on a regular basis. °· Wash your hands often. Use hand sanitizer if soap and water are not available. °SEEK MEDICAL CARE IF: °· You have a fever. °· You are losing sleep because you cannot control your cough with cough medicine. °SEEK IMMEDIATE MEDICAL CARE IF: °· You have worsening shortness of breath. °· You have increased chest pain. °· Your sickness becomes worse, especially if you are an older adult or have a weakened immune system. °· You cough up blood. °  °This information is not intended to replace advice given to you by your health care provider. Make sure you discuss any questions you have with your health care provider. °  °Document Released: 04/18/2005 Document Revised: 01/07/2015 Document Reviewed: 08/13/2014 °Elsevier Interactive Patient   Education ©2016 Elsevier Inc. ° °

## 2015-06-15 NOTE — Care Management Note (Addendum)
Case Management Note  Patient Details  Name: Halo Laski MRN: [id number] Date of Birth: 17-Aug-1940  Subjective/Objective:  Pt lives with sister, has cane, walker, and shower chair.  Discussed PT recommendations for home therapy, [facility] and RN services, cannot recall name of agency.  Pt states services are to resume when he is discharged.                                Expected Discharge Plan:  Home w Home Health Services  Discharge planning Services  CM Consult  Post Acute Care Choice:  Resumption of Svcs/PTA Provider  HH Arranged:  RN, PT  Status of Service:  Completed, signed off  Hiroki Wint, Chapman Fitch, RN 06/15/2015, 10:50 AM  TC [facility], learned that pt's PCP is Dr Darron Doom, and that pt is active with [facility] for PT.  Faxed history and physical and discharge summary as requested.  TC to [facility] liaison re pt's discharge and order adding OT services.

## 2015-06-18 LAB — CULTURE, BLOOD (ROUTINE X 2)
CULTURE: NO GROWTH
CULTURE: NO GROWTH

## 2015-07-21 ENCOUNTER — Encounter (HOSPITAL_COMMUNITY): Payer: Self-pay | Admitting: Emergency Medicine

## 2015-07-21 ENCOUNTER — Inpatient Hospital Stay (HOSPITAL_COMMUNITY)
Admission: EM | Admit: 2015-07-21 | Discharge: 2015-07-25 | DRG: 558 | Disposition: A | Discharge status: Home Health | Payer: Medicare Other | Attending: Internal Medicine | Admitting: Internal Medicine

## 2015-07-21 ENCOUNTER — Emergency Department (HOSPITAL_COMMUNITY): Payer: Medicare Other

## 2015-07-21 DIAGNOSIS — M545 Low back pain: Secondary | ICD-10-CM | POA: Diagnosis present

## 2015-07-21 DIAGNOSIS — W19XXXA Unspecified fall, initial encounter: Secondary | ICD-10-CM

## 2015-07-21 DIAGNOSIS — F209 Schizophrenia, unspecified: Secondary | ICD-10-CM | POA: Diagnosis present

## 2015-07-21 DIAGNOSIS — Z886 Allergy status to analgesic agent status: Secondary | ICD-10-CM

## 2015-07-21 DIAGNOSIS — F22 Delusional disorders: Secondary | ICD-10-CM

## 2015-07-21 DIAGNOSIS — M6282 Rhabdomyolysis: Principal | ICD-10-CM | POA: Diagnosis present

## 2015-07-21 DIAGNOSIS — E876 Hypokalemia: Secondary | ICD-10-CM | POA: Diagnosis present

## 2015-07-21 DIAGNOSIS — Z880 Allergy status to penicillin: Secondary | ICD-10-CM

## 2015-07-21 DIAGNOSIS — E785 Hyperlipidemia, unspecified: Secondary | ICD-10-CM | POA: Diagnosis present

## 2015-07-21 DIAGNOSIS — N400 Enlarged prostate without lower urinary tract symptoms: Secondary | ICD-10-CM | POA: Diagnosis present

## 2015-07-21 DIAGNOSIS — I10 Essential (primary) hypertension: Secondary | ICD-10-CM | POA: Diagnosis present

## 2015-07-21 DIAGNOSIS — R079 Chest pain, unspecified: Secondary | ICD-10-CM

## 2015-07-21 DIAGNOSIS — M542 Cervicalgia: Secondary | ICD-10-CM | POA: Diagnosis present

## 2015-07-21 DIAGNOSIS — F329 Major depressive disorder, single episode, unspecified: Secondary | ICD-10-CM | POA: Diagnosis present

## 2015-07-21 DIAGNOSIS — W06XXXA Fall from bed, initial encounter: Secondary | ICD-10-CM | POA: Diagnosis present

## 2015-07-21 DIAGNOSIS — Z79899 Other long term (current) drug therapy: Secondary | ICD-10-CM

## 2015-07-21 DIAGNOSIS — J449 Chronic obstructive pulmonary disease, unspecified: Secondary | ICD-10-CM | POA: Diagnosis present

## 2015-07-21 DIAGNOSIS — R748 Abnormal levels of other serum enzymes: Secondary | ICD-10-CM

## 2015-07-21 DIAGNOSIS — Z87891 Personal history of nicotine dependence: Secondary | ICD-10-CM

## 2015-07-21 DIAGNOSIS — F32A Depression, unspecified: Secondary | ICD-10-CM | POA: Diagnosis present

## 2015-07-21 DIAGNOSIS — Z7902 Long term (current) use of antithrombotics/antiplatelets: Secondary | ICD-10-CM

## 2015-07-21 DIAGNOSIS — E7800 Pure hypercholesterolemia, unspecified: Secondary | ICD-10-CM | POA: Diagnosis present

## 2015-07-21 DIAGNOSIS — K219 Gastro-esophageal reflux disease without esophagitis: Secondary | ICD-10-CM | POA: Diagnosis present

## 2015-07-21 HISTORY — DX: Unspecified fall, initial encounter: W19.XXXA

## 2015-07-21 MED ORDER — ONDANSETRON HCL 4 MG/2ML IJ SOLN
4.0000 mg | Freq: Once | INTRAMUSCULAR | Status: AC
  Administered 2015-07-22: 4 mg via INTRAVENOUS
  Filled 2015-07-21: qty 2

## 2015-07-21 MED ORDER — FENTANYL CITRATE (PF) 100 MCG/2ML IJ SOLN
50.0000 ug | Freq: Once | INTRAMUSCULAR | Status: AC
  Administered 2015-07-22: 50 ug via INTRAVENOUS
  Filled 2015-07-21: qty 2

## 2015-07-21 MED ORDER — SODIUM CHLORIDE 0.9 % IV SOLN
INTRAVENOUS | Status: DC
  Administered 2015-07-22: 01:00:00 via INTRAVENOUS

## 2015-07-21 NOTE — ED Notes (Signed)
Patient transported to CT 

## 2015-07-21 NOTE — ED Provider Notes (Signed)
By signing my name below, I, Norman Davis, attest that this documentation has been prepared under the direction and in the presence of Norman Davis N Norman Lemke, DO . Electronically Signed: Freida Davis, Scribe. 07/22/2015. 12:01 AM.   TIME SEEN: 11:51 PM   CHIEF COMPLAINT:   Chief Complaint  Patient presents with  . Fall    HPI:  HPI Comments:  Norman Davis is a 75 y.o. male with history of schizophrenia, hypertension, hyperlipidemia brought in by ambulance, who presents to the Emergency Department complaining of moderate neck pain, lower back pain and HA s/p fall this evening. He reports associated CP, abdominal pain, and left hip pain.  Pt states he lost his balance and fell out of bed. Pt notes he injured his chest, abdomen, head and neck during the fall. He was on the floor for a few hours after fall.  He denies CP, SOB and dizzines prior to fall.  He is on Plavix. Pt normally ambulates with a walker;  notes he has been losing his balance more often since ~ 04/26/15.  He denies recent fever, cough, vomiting, diarrhea, numbness and unilateral weakness. No alleviating factors noted. Pt lives at home with his sister.    ROS: See HPI Constitutional: no fever  Eyes: no drainage  ENT: no runny nose   Cardiovascular: chest pain  Resp: no SOB  GI: no vomiting GU: no dysuria Integumentary: no rash  Allergy: no hives  Musculoskeletal: no leg swelling  Neurological: no slurred speech ROS otherwise negative  PAST MEDICAL HISTORY/PAST SURGICAL HISTORY:  Past Medical History  Diagnosis Date  . Schizophrenia (HCC)   . Hypercholesteremia   . Hypertension   . H/O urinary retention   . GERD (gastroesophageal reflux disease)   . Small bowel obstruction due to adhesions (HCC)   . 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 Nephrolithiasis     right  . Cholelithiasis   . Shortness of breath dyspnea     MEDICATIONS:  Prior to Admission medications   Medication Sig Start Date End  Date Taking? Authorizing Provider  albuterol (PROVENTIL HFA;VENTOLIN HFA) 108 (90 BASE) MCG/ACT inhaler Inhale 2 puffs into the lungs every 4 (four) hours as needed for wheezing or shortness of breath.    Historical Provider, MD  amLODipine (NORVASC) 10 MG tablet Take 10 mg by mouth daily.    Historical Provider, MD  cetirizine (ZYRTEC) 10 MG tablet Take 10 mg by mouth at bedtime.     Historical Provider, MD  clopidogrel (PLAVIX) 75 MG tablet Take 75 mg by mouth daily with breakfast.    Historical Provider, MD  dextromethorphan-guaiFENesin (MUCINEX DM) 30-600 MG 12hr tablet Take 1 tablet by mouth 2 (two) times daily. 06/15/15   Norman Mikhail, DO  doxazosin (CARDURA) 8 MG tablet Take 8 mg by mouth at bedtime.    Historical Provider, MD  finasteride (PROSCAR) 5 MG tablet Take 5 mg by mouth daily.    Historical Provider, MD  FLUoxetine (PROZAC) 20 MG capsule Take 40 mg by mouth daily.     Historical Provider, MD  levofloxacin (LEVAQUIN) 750 MG tablet Take 1 tablet (750 mg total) by mouth daily. 06/15/15   Norman Mikhail, DO  meloxicam (MOBIC) 7.5 MG tablet Take 7.5 mg by mouth daily.    Historical Provider, MD  Mirabegron (MYRBETRIQ PO) Take 25 mg by mouth daily.     Historical Provider, MD  mirtazapine (REMERON) 30 MG tablet Take 30 mg by mouth  at bedtime.    Historical Provider, MD  oxyCODONE-acetaminophen (PERCOCET/ROXICET) 5-325 MG tablet Take 1 tablet by mouth every 4 (four) hours as needed for moderate pain. Patient not taking: Reported on 06/12/2015 05/01/15   [name], DO  pantoprazole (PROTONIX) 40 MG tablet Take 40 mg by mouth daily.    Historical Provider, MD  pravastatin (PRAVACHOL) 40 MG tablet Take 40 mg by mouth at bedtime.     Historical Provider, MD  predniSONE (DELTASONE) 10 MG tablet Take  (4 tabs) x 3 days, then taper to  (3 tabs) x 3 days, then  (2 tabs) x 3days, then  (1 tab) x 3days, then OFF. 06/15/15   Norman Mikhail, DO  senna (SENOKOT) 8.6 MG TABS  tablet Take 1 tablet by mouth daily.    Historical Provider, MD  tiotropium (SPIRIVA) 18 MCG inhalation capsule Place 18 mcg into inhaler and inhale daily.    Historical Provider, MD    ALLERGIES:  Allergies  Allergen Reactions  . Aspirin     unknown  . Penicillins     Unknown Has patient had a PCN reaction causing immediate rash, facial/tongue/throat swelling, SOB or lightheadedness with hypotension: NO Has patient had a PCN reaction causing severe rash involving mucus membranes or skin necrosis: NO Has patient had a PCN reaction that required hospitalization NO Has patient had a PCN reaction occurring within the last 10 years: NO If all of the above answers are "NO", then may proceed with Cephalosporin use.    SOCIAL HISTORY:  Social History  Substance Use Topics  . Smoking status: Former Smoker -- 1.00 packs/day    Types: Cigarettes  . Smokeless tobacco: Never Used  . Alcohol Use: No    FAMILY HISTORY: Family History  Problem Relation Age of Onset  . Cancer Mother   . Cancer Father     EXAM: BP 155/105 mmHg  Pulse 94  Temp(Src) 98.8 F (37.1 C) (Oral)  Resp 17  Ht  (1.753 m)  Wt 191 lb (86.637 kg)  BMI 28.19 kg/m2  SpO2 94% CONSTITUTIONAL: Alert and oriented 3 and responds appropriately to questions. Elderly, chronically ill-appearing; well-nourished; GCS 15 HEAD: Normocephalic; atraumatic EYES: Conjunctivae clear, PERRL, EOMI ENT: normal nose; no rhinorrhea; moist mucous membranes; pharynx without lesions noted; no dental injury; no septal hematoma NECK: Supple, no meningismus, no LAD; patient has some cervical spine tenderness without step-off or deformity, cervical collar in place CARD: RRR; S1 and S2 appreciated; no murmurs, no clicks, no rubs, no gallops RESP: Normal chest excursion without splinting or tachypnea; breath sounds clear and equal bilaterally; no wheezes, no rhonchi, no rales; no hypoxia or respiratory distress CHEST:  chest wall stable, no  crepitus or ecchymosis or deformity, tender to palpation diffusely over the anterior chest wall, no sign of flow chest ABD/GI: Normal bowel sounds; non-distended; soft, tender to palpation diffusely throughout the abdomen with intermittent voluntary guarding, no rebound, no peritoneal signs PELVIS:  stable, tender over the left hip without likely discrepancy BACK:  The back appears normal and is non-tender to palpation, there is no CVA tenderness; no midline spinal tenderness, step-off or deformity EXT: Tender over the left hip without length discrepancy, Normal ROM in all joints; otherwise extremities are non-tender to palpation; no edema; normal capillary refill; no cyanosis, no bony tenderness or bony deformity of patient's extremities, no joint effusion, no ecchymosis or lacerations    SKIN: Normal color for age and race; warm NEURO: Moves all extremities equally, sensation to  light touch intact diffusely, cranial nerves II through XII intact PSYCH: The patient's mood and manner are appropriate. Grooming and personal hygiene are appropriate.  MEDICAL DECISION MAKING: Patient here with fall out of bed. States he has been losing his balance more frequently since December 2016. States he did stay on the floor for several hours today before someone can get him off the floor. Has no new focal neurologic deficits. Hemodynamically stable. Patient initially screened briefly by previous physician. CT head, cervical spine and lumbar x-rays show no acute injury. We'll obtain x-rays of his chest, CT of his abdomen and pelvis, x-ray of his left hip. We'll give pain medication. Will obtain labs, urine to evaluate for possible organic cause for his frequent falls.  ED PROGRESS: 3:30 AM  Pt's imaging shows no acute injury but he does have an elevated CK level greater than 5400. Creatinine is 1.13. He is receiving IV fluids. Given his age and chronic medical conditions or feel he will need admission to have his CK  monitored and for further IV hydration. [facility].  3:50 AM  D/w Dr. Clyde LundborgNiu who recommends admission to telemetry, observation bed. I will place holding orders per his request. Care transferred to hospitalist service. Patient has been updated with this plan.   EKG Interpretation  Date/Time:  Wednesday July 22 2015 00:43:15 EDT Ventricular Rate:  81 PR Interval:  181 QRS Duration: 157 QT Interval:  433 QTC Calculation: 503 R Axis:   -99 Text Interpretation:  Sinus rhythm Ventricular premature complex RBBB and LAFB Inferior infarct, old Lateral leads are also involved No significant change since last tracing Confirmed by Sharonne Ricketts,  DO, Beverley Sherrard ([id number]) on 07/22/2015 1:09:05 AM        I personally performed the services described in this documentation, which was scribed in my presence. The recorded information has been reviewed and is accurate.     Layla MawKristen N Dorien Bessent, DO 07/22/15 [phone]

## 2015-07-21 NOTE — ED Notes (Signed)
Family member called regarding to call her in case is need it [phone].

## 2015-07-21 NOTE — ED Notes (Signed)
Pt brought to ED by [facility] from home after a fall, pt sleep while trying to put his pants on, sister was present, sister state pt stay on the floor for couple of hours because she wasn't able to help him up. Pt denies LOC. Complaining of 8/10  Pain on his neck, head and lower back. VS HR 90, BP 130 93, R-19, 96% on RA. SR on [facility] EKG.

## 2015-07-22 ENCOUNTER — Emergency Department (HOSPITAL_COMMUNITY): Payer: Medicare Other

## 2015-07-22 ENCOUNTER — Observation Stay (HOSPITAL_COMMUNITY): Payer: Medicare Other

## 2015-07-22 ENCOUNTER — Encounter (HOSPITAL_COMMUNITY): Payer: Self-pay | Admitting: Radiology

## 2015-07-22 DIAGNOSIS — F32A Depression, unspecified: Secondary | ICD-10-CM | POA: Diagnosis present

## 2015-07-22 DIAGNOSIS — F329 Major depressive disorder, single episode, unspecified: Secondary | ICD-10-CM | POA: Diagnosis not present

## 2015-07-22 DIAGNOSIS — Z87891 Personal history of nicotine dependence: Secondary | ICD-10-CM | POA: Diagnosis not present

## 2015-07-22 DIAGNOSIS — E7800 Pure hypercholesterolemia, unspecified: Secondary | ICD-10-CM | POA: Diagnosis present

## 2015-07-22 DIAGNOSIS — E876 Hypokalemia: Secondary | ICD-10-CM | POA: Diagnosis present

## 2015-07-22 DIAGNOSIS — W06XXXA Fall from bed, initial encounter: Secondary | ICD-10-CM | POA: Diagnosis present

## 2015-07-22 DIAGNOSIS — J439 Emphysema, unspecified: Secondary | ICD-10-CM | POA: Diagnosis not present

## 2015-07-22 DIAGNOSIS — M6282 Rhabdomyolysis: Secondary | ICD-10-CM | POA: Diagnosis present

## 2015-07-22 DIAGNOSIS — E785 Hyperlipidemia, unspecified: Secondary | ICD-10-CM | POA: Diagnosis present

## 2015-07-22 DIAGNOSIS — W19XXXA Unspecified fall, initial encounter: Secondary | ICD-10-CM | POA: Diagnosis not present

## 2015-07-22 DIAGNOSIS — N400 Enlarged prostate without lower urinary tract symptoms: Secondary | ICD-10-CM | POA: Diagnosis present

## 2015-07-22 DIAGNOSIS — F209 Schizophrenia, unspecified: Secondary | ICD-10-CM | POA: Diagnosis present

## 2015-07-22 DIAGNOSIS — Z886 Allergy status to analgesic agent status: Secondary | ICD-10-CM | POA: Diagnosis not present

## 2015-07-22 DIAGNOSIS — R079 Chest pain, unspecified: Secondary | ICD-10-CM | POA: Diagnosis not present

## 2015-07-22 DIAGNOSIS — R748 Abnormal levels of other serum enzymes: Secondary | ICD-10-CM | POA: Insufficient documentation

## 2015-07-22 DIAGNOSIS — W19XXXS Unspecified fall, sequela: Secondary | ICD-10-CM | POA: Diagnosis not present

## 2015-07-22 DIAGNOSIS — Z79899 Other long term (current) drug therapy: Secondary | ICD-10-CM | POA: Diagnosis not present

## 2015-07-22 DIAGNOSIS — M545 Low back pain: Secondary | ICD-10-CM | POA: Diagnosis present

## 2015-07-22 DIAGNOSIS — J449 Chronic obstructive pulmonary disease, unspecified: Secondary | ICD-10-CM | POA: Diagnosis present

## 2015-07-22 DIAGNOSIS — Z880 Allergy status to penicillin: Secondary | ICD-10-CM | POA: Diagnosis not present

## 2015-07-22 DIAGNOSIS — M542 Cervicalgia: Secondary | ICD-10-CM | POA: Diagnosis present

## 2015-07-22 DIAGNOSIS — K219 Gastro-esophageal reflux disease without esophagitis: Secondary | ICD-10-CM | POA: Diagnosis present

## 2015-07-22 DIAGNOSIS — Z7902 Long term (current) use of antithrombotics/antiplatelets: Secondary | ICD-10-CM | POA: Diagnosis not present

## 2015-07-22 HISTORY — DX: Unspecified fall, initial encounter: W19.XXXA

## 2015-07-22 LAB — BASIC METABOLIC PANEL
ANION GAP: 10 (ref 5–15)
ANION GAP: 11 (ref 5–15)
BUN: 11 mg/dL (ref 6–20)
BUN: 9 mg/dL (ref 6–20)
CALCIUM: 8.7 mg/dL — AB (ref 8.9–10.3)
CHLORIDE: 112 mmol/L — AB (ref 101–111)
CO2: 24 mmol/L (ref 22–32)
CO2: 24 mmol/L (ref 22–32)
CREATININE: 1.04 mg/dL (ref 0.61–1.24)
CREATININE: 1.13 mg/dL (ref 0.61–1.24)
Calcium: 9.1 mg/dL (ref 8.9–10.3)
Chloride: 111 mmol/L (ref 101–111)
GFR calc Af Amer: >60 mL/min (ref >60)
GFR calc non Af Amer: >60 mL/min (ref >60)
GFR calc non Af Amer: >60 mL/min (ref >60)
GLUCOSE: 84 mg/dL (ref 65–99)
Glucose, Bld: 87 mg/dL (ref 65–99)
Potassium: 3.2 mmol/L — ABNORMAL LOW (ref 3.5–5.1)
Potassium: 3.4 mmol/L — ABNORMAL LOW (ref 3.5–5.1)
SODIUM: 147 mmol/L — AB (ref 135–145)
Sodium: 145 mmol/L (ref 135–145)

## 2015-07-22 LAB — CBC WITH DIFFERENTIAL/PLATELET
BASOS ABS: 0 10*3/uL (ref 0.0–0.1)
BASOS PCT: 0 %
EOS ABS: 0 10*3/uL (ref 0.0–0.7)
Eosinophils Relative: 0 %
HEMATOCRIT: 35.7 % — AB (ref 39.0–52.0)
HEMOGLOBIN: 11.4 g/dL — AB (ref 13.0–17.0)
Lymphocytes Relative: 21 %
Lymphs Abs: 1.5 10*3/uL (ref 0.7–4.0)
MCH: 27.5 pg (ref 26.0–34.0)
MCHC: 31.9 g/dL (ref 30.0–36.0)
MCV: 86.2 fL (ref 78.0–100.0)
Monocytes Absolute: 0.9 10*3/uL (ref 0.1–1.0)
Monocytes Relative: 12 %
NEUTROS ABS: 4.9 10*3/uL (ref 1.7–7.7)
NEUTROS PCT: 67 %
Platelets: 177 10*3/uL (ref 150–400)
RBC: 4.14 MIL/uL — AB (ref 4.22–5.81)
RDW: 15.6 % — ABNORMAL HIGH (ref 11.5–15.5)
WBC: 7.3 10*3/uL (ref 4.0–10.5)

## 2015-07-22 LAB — CBC
HCT: 38.8 % — ABNORMAL LOW (ref 39.0–52.0)
HEMOGLOBIN: 12.4 g/dL — AB (ref 13.0–17.0)
MCH: 27.7 pg (ref 26.0–34.0)
MCHC: 32 g/dL (ref 30.0–36.0)
MCV: 86.8 fL (ref 78.0–100.0)
Platelets: 183 10*3/uL (ref 150–400)
RBC: 4.47 MIL/uL (ref 4.22–5.81)
RDW: 15.6 % — ABNORMAL HIGH (ref 11.5–15.5)
WBC: 6.7 10*3/uL (ref 4.0–10.5)

## 2015-07-22 LAB — URINALYSIS, ROUTINE W REFLEX MICROSCOPIC
Bilirubin Urine: NEGATIVE
GLUCOSE, UA: NEGATIVE mg/dL
Ketones, ur: 15 mg/dL — AB
Leukocytes, UA: NEGATIVE
NITRITE: NEGATIVE
PROTEIN: NEGATIVE mg/dL
SPECIFIC GRAVITY, URINE: 1.033 — AB (ref 1.005–1.030)
pH: 6.5 (ref 5.0–8.0)

## 2015-07-22 LAB — URINE MICROSCOPIC-ADD ON: WBC UA: NONE SEEN WBC/hpf (ref 0–5)

## 2015-07-22 LAB — CK
CK TOTAL: 13077 U/L — AB (ref 49–397)
CK TOTAL: 5464 U/L — AB (ref 49–397)

## 2015-07-22 LAB — MAGNESIUM: MAGNESIUM: 1.5 mg/dL — AB (ref 1.7–2.4)

## 2015-07-22 LAB — CBG MONITORING, ED: Glucose-Capillary: 111 mg/dL — ABNORMAL HIGH (ref 65–99)

## 2015-07-22 MED ORDER — AMLODIPINE BESYLATE 10 MG PO TABS
10.0000 mg | ORAL_TABLET | Freq: Every day | ORAL | Status: DC
  Administered 2015-07-22 – 2015-07-25 (×4): 10 mg via ORAL
  Filled 2015-07-22 (×2): qty 2
  Filled 2015-07-22 (×3): qty 1

## 2015-07-22 MED ORDER — IOHEXOL 300 MG/ML  SOLN
100.0000 mL | Freq: Once | INTRAMUSCULAR | Status: AC | PRN
  Administered 2015-07-22: 100 mL via INTRAVENOUS

## 2015-07-22 MED ORDER — PRAVASTATIN SODIUM 40 MG PO TABS
40.0000 mg | ORAL_TABLET | Freq: Every day | ORAL | Status: DC
  Administered 2015-07-22 – 2015-07-24 (×3): 40 mg via ORAL
  Filled 2015-07-22 (×3): qty 1

## 2015-07-22 MED ORDER — MIRTAZAPINE 30 MG PO TABS
30.0000 mg | ORAL_TABLET | Freq: Every day | ORAL | Status: DC
  Administered 2015-07-22 – 2015-07-24 (×3): 30 mg via ORAL
  Filled 2015-07-22 (×3): qty 1

## 2015-07-22 MED ORDER — FINASTERIDE 5 MG PO TABS
5.0000 mg | ORAL_TABLET | Freq: Every day | ORAL | Status: DC
  Administered 2015-07-22 – 2015-07-25 (×4): 5 mg via ORAL
  Filled 2015-07-22 (×4): qty 1

## 2015-07-22 MED ORDER — POTASSIUM CHLORIDE 20 MEQ/15ML (10%) PO SOLN
20.0000 meq | Freq: Once | ORAL | Status: AC
  Administered 2015-07-22: 20 meq via ORAL
  Filled 2015-07-22: qty 15

## 2015-07-22 MED ORDER — CLOPIDOGREL BISULFATE 75 MG PO TABS
75.0000 mg | ORAL_TABLET | Freq: Every day | ORAL | Status: DC
  Administered 2015-07-22 – 2015-07-25 (×4): 75 mg via ORAL
  Filled 2015-07-22 (×4): qty 1

## 2015-07-22 MED ORDER — TIOTROPIUM BROMIDE MONOHYDRATE 18 MCG IN CAPS
18.0000 ug | ORAL_CAPSULE | Freq: Every day | RESPIRATORY_TRACT | Status: DC
  Administered 2015-07-22 – 2015-07-25 (×3): 18 ug via RESPIRATORY_TRACT
  Filled 2015-07-22: qty 5

## 2015-07-22 MED ORDER — MORPHINE SULFATE (PF) 2 MG/ML IV SOLN: 1.0000 mg | INTRAVENOUS | Status: DC | PRN

## 2015-07-22 MED ORDER — SODIUM CHLORIDE 0.9% FLUSH
3.0000 mL | Freq: Two times a day (BID) | INTRAVENOUS | Status: DC
  Administered 2015-07-22 – 2015-07-24 (×4): 3 mL via INTRAVENOUS

## 2015-07-22 MED ORDER — ACETAMINOPHEN 325 MG PO TABS
650.0000 mg | ORAL_TABLET | Freq: Four times a day (QID) | ORAL | Status: DC | PRN
  Administered 2015-07-23: 650 mg via ORAL
  Filled 2015-07-22: qty 2

## 2015-07-22 MED ORDER — ACETAMINOPHEN 650 MG RE SUPP
650.0000 mg | Freq: Four times a day (QID) | RECTAL | Status: DC | PRN
Start: 2015-07-22 — End: 2015-07-25

## 2015-07-22 MED ORDER — LORATADINE 10 MG PO TABS
10.0000 mg | ORAL_TABLET | Freq: Every day | ORAL | Status: DC
  Administered 2015-07-22 – 2015-07-25 (×4): 10 mg via ORAL
  Filled 2015-07-22 (×4): qty 1

## 2015-07-22 MED ORDER — ENOXAPARIN SODIUM 40 MG/0.4ML ~~LOC~~ SOLN
40.0000 mg | Freq: Every day | SUBCUTANEOUS | Status: DC
  Administered 2015-07-22 – 2015-07-25 (×4): 40 mg via SUBCUTANEOUS
  Filled 2015-07-22 (×5): qty 0.4

## 2015-07-22 MED ORDER — FLUOXETINE HCL 20 MG PO CAPS
40.0000 mg | ORAL_CAPSULE | Freq: Every day | ORAL | Status: DC
  Administered 2015-07-22 – 2015-07-25 (×4): 40 mg via ORAL
  Filled 2015-07-22 (×4): qty 2

## 2015-07-22 MED ORDER — SODIUM CHLORIDE 0.9 % IV BOLUS (SEPSIS)
1000.0000 mL | Freq: Once | INTRAVENOUS | Status: AC
  Administered 2015-07-22: 1000 mL via INTRAVENOUS

## 2015-07-22 MED ORDER — MIRABEGRON ER 25 MG PO TB24
25.0000 mg | ORAL_TABLET | Freq: Every day | ORAL | Status: DC
  Administered 2015-07-22 – 2015-07-24 (×3): 25 mg via ORAL
  Filled 2015-07-22 (×7): qty 1

## 2015-07-22 MED ORDER — OXYCODONE-ACETAMINOPHEN 5-325 MG PO TABS
1.0000 | ORAL_TABLET | ORAL | Status: DC | PRN
  Administered 2015-07-22 – 2015-07-24 (×6): 1 via ORAL
  Filled 2015-07-22 (×6): qty 1

## 2015-07-22 MED ORDER — HYDRALAZINE HCL 20 MG/ML IJ SOLN
5.0000 mg | INTRAMUSCULAR | Status: DC | PRN
  Administered 2015-07-22: 5 mg via INTRAVENOUS
  Filled 2015-07-22: qty 1

## 2015-07-22 MED ORDER — MAGNESIUM SULFATE 2 GM/50ML IV SOLN
2.0000 g | Freq: Once | INTRAVENOUS | Status: AC
  Administered 2015-07-22: 2 g via INTRAVENOUS
  Filled 2015-07-22: qty 50

## 2015-07-22 MED ORDER — ALBUTEROL SULFATE (2.5 MG/3ML) 0.083% IN NEBU: 2.5000 mg | INHALATION_SOLUTION | RESPIRATORY_TRACT | Status: DC | PRN

## 2015-07-22 MED ORDER — SODIUM CHLORIDE 0.9 % IV SOLN
INTRAVENOUS | Status: DC
  Administered 2015-07-22: 07:00:00 via INTRAVENOUS

## 2015-07-22 MED ORDER — DOXAZOSIN MESYLATE 8 MG PO TABS
8.0000 mg | ORAL_TABLET | Freq: Every day | ORAL | Status: DC
  Administered 2015-07-22 – 2015-07-24 (×3): 8 mg via ORAL
  Filled 2015-07-22 (×3): qty 1

## 2015-07-22 MED ORDER — PANTOPRAZOLE SODIUM 40 MG PO TBEC
40.0000 mg | DELAYED_RELEASE_TABLET | Freq: Every day | ORAL | Status: DC
  Administered 2015-07-22 – 2015-07-25 (×4): 40 mg via ORAL
  Filled 2015-07-22 (×4): qty 1

## 2015-07-22 MED ORDER — SODIUM CHLORIDE 0.9 % IV SOLN
INTRAVENOUS | Status: DC
  Administered 2015-07-22 – 2015-07-23 (×2): via INTRAVENOUS

## 2015-07-22 MED ORDER — DM-GUAIFENESIN ER 30-600 MG PO TB12
1.0000 | ORAL_TABLET | Freq: Two times a day (BID) | ORAL | Status: DC
  Administered 2015-07-22 – 2015-07-25 (×7): 1 via ORAL
  Filled 2015-07-22 (×7): qty 1

## 2015-07-22 NOTE — H&P (Signed)
[facility] History and Physical  Norman Davis ZOX:[id number] DOB: Oct 21, 1940 DOA: 07/21/2015  Referring physician: ED physician PCP: Pcp Not In System  Specialists:   Chief Complaint: pain over neck, back, left chest wall, abdomen, head after fall  HPI: Norman Davis is a 75 y.o. male with PMH of hypertension, hyperlipidemia, COPD, GERD, depression, small bowel obstruction, Paget's bone disease, kidney stone, gallstone, schizophrenia on admission, BPH, who presents with pain over neck, back, left chest wall, abdomenand head after fall.  Pt report that he had fall when he was putting pans on and lost his balance at about 7:00 PM last night. His sister state that pt stayed on the floor for couple of hours because she wasn't able to help him up. Patient did not have shortness of breath, dizziness, seizure. No loss of consciousness. Patient complains of pain over neck, back, left chest wall, abdomen and head after fall. Patient does not have nausea, vomiting, abdominal pain, symptoms of UTI. His reports that he had 5-6 fall in this month. He states that his left leg is weak. Patient reports that he had 3 loose bowel movement yesterday, but no diarrhea today. No fever, chills, symptoms of UTI, vision change or hearing loss.  In ED, patient was found to have CK 5464, WBC 7.3, temperature normal, no tachycardia, no tachypnea, potassium 3.2, creatinine 1.13. Chest x-ray showed emphysema, but no acute abnormalities. Patient is admitted to the patient for further events of treatment.  # CT abdomen/pelvis is negative for acute bone abnormalities, but showed pagetoid changes in pelvic bones and right femur, cholelithiasis, segmental thickening of the distal ascending colon possibly related to under distention, underlying lesion is not excluded. # CT-head and C-spin showed no acute intracranial abnormalities, nonspecific straightening of the usual cervical lordosis, diffuse degenerative changes.  # X-ray  of left hip showed no acute bony abnormalities, posible Paget's disease. Degenerative changes in the hips. Postoperative changes in the lower lumbar spine and pelvis.  EKG: Independently reviewed. 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   At home  Can patient participate in ADLs?   Barely  Review of Systems:   General: no fevers, chills, no changes in body weight, has poor appetite, has fatigue HEENT: no blurry vision, hearing changes or sore throat Pulm: no dyspnea, coughing, wheezing CV: has left chest wall pain, no palpitations Abd: no nausea, vomiting, has central abdominal pain, had diarrhea yesterday, constipation GU: no dysuria, burning on urination, increased urinary frequency, hematuria  Ext: no leg edema Neuro: has left leg weakness, no numbness, or tingling, no vision change or hearing loss Skin: no rash MSK: No muscle spasm, no deformity, no limitation of range of movement in spin Heme: No easy bruising.  Travel history: No recent long distant travel.  Allergy:  Allergies  Allergen Reactions  . Aspirin     unknown  . Penicillins     Unknown Has patient had a PCN reaction causing immediate rash, facial/tongue/throat swelling, SOB or lightheadedness with hypotension: NO Has patient had a PCN reaction causing severe rash involving mucus membranes or skin necrosis: NO Has patient had a PCN reaction that required hospitalization NO Has patient had a PCN reaction occurring within the last 10 years: NO If all of the above answers are "NO", then may proceed with Cephalosporin use.    Past Medical History  Diagnosis Date  . Schizophrenia (HCC)   . Hypercholesteremia   . Hypertension   . H/O urinary retention   .  GERD (gastroesophageal reflux disease)   . Small bowel obstruction due to adhesions (HCC)   . 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 Nephrolithiasis     right  . Cholelithiasis   . Shortness of breath dyspnea     Past  Surgical History  Procedure Laterality Date  . Total hip arthroplasty    . Pelvic fracture surgery    . Liver surgery      Social History:  reports that he has quit smoking. His smoking use included Cigarettes. He smoked 1.00 pack per day. He has never used smokeless tobacco. He reports that he does not drink alcohol or use illicit drugs.  Family History:  Family History  Problem Relation Age of Onset  . Cancer Mother   . Cancer Father      Prior to Admission medications   Medication Sig Start Date End Date Taking? Authorizing Provider  albuterol (PROVENTIL HFA;VENTOLIN HFA) 108 (90 BASE) MCG/ACT inhaler Inhale 2 puffs into the lungs every 4 (four) hours as needed for wheezing or shortness of breath.    Historical Provider, MD  amLODipine (NORVASC) 10 MG tablet Take 10 mg by mouth daily.    Historical Provider, MD  cetirizine (ZYRTEC) 10 MG tablet Take 10 mg by mouth at bedtime.     Historical Provider, MD  clopidogrel (PLAVIX) 75 MG tablet Take 75 mg by mouth daily with breakfast.    Historical Provider, MD  dextromethorphan-guaiFENesin (MUCINEX DM) 30-600 MG 12hr tablet Take 1 tablet by mouth 2 (two) times daily. 06/15/15   [name], DO  doxazosin (CARDURA) 8 MG tablet Take 8 mg by mouth at bedtime.    Historical Provider, MD  finasteride (PROSCAR) 5 MG tablet Take 5 mg by mouth daily.    Historical Provider, MD  FLUoxetine (PROZAC) 20 MG capsule Take 40 mg by mouth daily.     Historical Provider, MD  levofloxacin (LEVAQUIN) 750 MG tablet Take 1 tablet (750 mg total) by mouth daily. 06/15/15   [name], DO  meloxicam (MOBIC) 7.5 MG tablet Take 7.5 mg by mouth daily.    Historical Provider, MD  Mirabegron (MYRBETRIQ PO) Take 25 mg by mouth daily.     Historical Provider, MD  mirtazapine (REMERON) 30 MG tablet Take 30 mg by mouth at bedtime.    Historical Provider, MD  oxyCODONE-acetaminophen (PERCOCET/ROXICET) 5-325 MG tablet Take 1 tablet by mouth every 4 (four) hours as  needed for moderate pain. Patient not taking: Reported on 06/12/2015 05/01/15   [name], DO  pantoprazole (PROTONIX) 40 MG tablet Take 40 mg by mouth daily.    Historical Provider, MD  pravastatin (PRAVACHOL) 40 MG tablet Take 40 mg by mouth at bedtime.     Historical Provider, MD  predniSONE (DELTASONE) 10 MG tablet Take 40mg  (4 tabs) x 3 days, then taper to 30mg  (3 tabs) x 3 days, then 20mg  (2 tabs) x 3days, then 10mg  (1 tab) x 3days, then OFF. 06/15/15   [name], DO  senna (SENOKOT) 8.6 MG TABS tablet Take 1 tablet by mouth daily.    Historical Provider, MD  tiotropium (SPIRIVA) 18 MCG inhalation capsule Place 18 mcg into inhaler and inhale daily.    Historical Provider, MD    Physical Exam: Filed Vitals:   07/22/15 0300 07/22/15 0315 07/22/15 0327 07/22/15 0330  BP: 172/111 173/109 168/111 171/115  Pulse: 76  76 74  Temp:   98 F (36.7 C)  TempSrc:   Oral   Resp: 14 20 14 12   Height:      Weight:      SpO2: 95%  95% 99%   General: Not in acute distress HEENT:       Eyes: PERRL, EOMI, no scleral icterus.       ENT: No discharge from the ears and nose, no pharynx injection, no tonsillar enlargement.        Neck: No JVD, no bruit, no mass felt. Heme: No neck lymph node enlargement. Cardiac: S1/S2, RRR, No murmurs, No gallops or rubs. Pulm:  No rales, wheezing, rhonchi or rubs. Abd: Soft, nondistended, tenderness over periumbilical area, no rebound pain, no organomegaly, BS present. Ext: No pitting leg edema bilaterally. 2+DP/PT pulse bilaterally. Musculoskeletal: has tenderness over posterior neck, lower back back, left chest wall, Skin: No rashes.  Neuro: Alert, oriented X3, cranial nerves II-XII grossly intact, moves all extremities normally. Muscle strength 5/5 in all extremities, sensation to light touch intact.  Knee reflex 1+ bilaterally. Negative Babinski's sign.  Psych: Patient is not psychotic, no suicidal or hemocidal ideation.  Labs on Admission:  Basic  Metabolic Panel:  Recent Labs Lab 07/22/15 0011  NA 147*  K 3.2*  CL 112*  CO2 24  GLUCOSE 87  BUN 11  CREATININE 1.13  CALCIUM 9.1   Liver Function Tests: No results for input(s): AST, ALT, ALKPHOS, BILITOT, PROT, ALBUMIN in the last 168 hours. No results for input(s): LIPASE, AMYLASE in the last 168 hours. No results for input(s): AMMONIA in the last 168 hours. CBC:  Recent Labs Lab 07/22/15 0011  WBC 7.3  NEUTROABS 4.9  HGB 11.4*  HCT 35.7*  MCV 86.2  PLT 177   Cardiac Enzymes:  Recent Labs Lab 07/22/15 0011  CKTOTAL 5464*    BNP (last 3 results)  Recent Labs  04/28/15 1838  BNP 23.1    ProBNP (last 3 results) No results for input(s): PROBNP in the last 8760 hours.  CBG: No results for input(s): GLUCAP in the last 168 hours.  Radiological Exams on Admission: Dg Chest 2 View  07/22/2015  CLINICAL DATA:  75 year old male with fall and left hip pain. Anterior chest wall pain. EXAM: CHEST  2 VIEW COMPARISON:  Radiograph dated 06/12/2015 FINDINGS: Two views of the chest demonstrate emphysematous changes of the lungs. There is no focal consolidation, pleural effusion, or pneumothorax. Stable cardiac silhouette. There is degenerative changes of the spine. No acute fracture. IMPRESSION: No acute intrathoracic pathology. Emphysema. Electronically Signed   By: Elgie Collard M.D.   On: 07/22/2015 00:32   Dg Lumbar Spine Complete  07/21/2015  CLINICAL DATA:  Fall today.  Low back pain.  Prior lumbar surgery. EXAM: LUMBAR SPINE - COMPLETE 4+ VIEW COMPARISON:  CT abdomen and pelvis 04/29/2015. Lumbar spine MRI 04/28/2015. FINDINGS: Sequelae of L5-S1 posterior fusion are again identified. Advanced L4-5 disc degeneration is again seen with severe disc space height loss and degenerative endplate spurring. Vertebral body heights are preserved without evidence of compression fracture. Slight anterolisthesis of L4 on L5 is grossly unchanged. No pars defects are identified.  Extensive prior internal fixation is noted involving the left iliac bone with screws traversing the left SI joint. 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 An IVC filter is noted at the L2 level. IMPRESSION: 1. Prior L5-S1 posterior fusion without radiographic evidence of are were complication. 2. 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 Electronically Signed  By: Sebastian Ache M.D.   On: 07/21/2015 23:32   Ct Head Wo Contrast  07/21/2015  CLINICAL DATA:  Fall today. No loss of consciousness. History of hypertension. EXAM: CT HEAD WITHOUT CONTRAST CT CERVICAL SPINE WITHOUT CONTRAST TECHNIQUE: Multidetector CT imaging of the head and cervical spine was performed following the standard protocol without intravenous contrast. Multiplanar CT image reconstructions of the cervical spine were also generated. COMPARISON:  None. FINDINGS: CT HEAD FINDINGS Diffuse cerebral atrophy. Ventricular dilatation consistent with central atrophy. Low-attenuation changes in the deep white matter are nonspecific but consistent with small vessel ischemia. No mass effect or midline shift. No abnormal extra-axial fluid collections. Gray-white matter junctions are distinct. Basal cisterns are not effaced. No evidence of acute intracranial hemorrhage. No depressed skull fractures. Mild mucosal thickening in the paranasal sinuses. Mastoid air cells are not opacified. Vascular calcifications. CT CERVICAL SPINE FINDINGS Straightening of the usual cervical lordosis. This may be due to patient positioning or degenerative changes but ligamentous injury or muscle spasm could also have this appearance and are not excluded. No anterior subluxation. Normal alignment of the facet joints. Degenerative changes throughout the cervical spine with narrowed interspaces and associated endplate hypertrophic changes. No vertebral compression deformities. No prevertebral soft tissue  swelling. C1-2 articulation appears intact. No focal bone lesion or bone destruction. Soft tissues are unremarkable. Severe bullous emphysematous changes demonstrated in the lung apices. IMPRESSION: No acute intracranial abnormalities. Chronic atrophy and small vessel ischemic changes. Nonspecific straightening of the usual cervical lordosis. Diffuse degenerative changes. No acute displaced fractures identified in the cervical spine. Electronically Signed   By: Burman Nieves M.D.   On: 07/21/2015 23:44   Ct Cervical Spine Wo Contrast  07/21/2015  CLINICAL DATA:  Fall today. No loss of consciousness. History of hypertension. EXAM: CT HEAD WITHOUT CONTRAST CT CERVICAL SPINE WITHOUT CONTRAST TECHNIQUE: Multidetector CT imaging of the head and cervical spine was performed following the standard protocol without intravenous contrast. Multiplanar CT image reconstructions of the cervical spine were also generated. COMPARISON:  None. FINDINGS: CT HEAD FINDINGS Diffuse cerebral atrophy. Ventricular dilatation consistent with central atrophy. Low-attenuation changes in the deep white matter are nonspecific but consistent with small vessel ischemia. No mass effect or midline shift. No abnormal extra-axial fluid collections. Gray-white matter junctions are distinct. Basal cisterns are not effaced. No evidence of acute intracranial hemorrhage. No depressed skull fractures. Mild mucosal thickening in the paranasal sinuses. Mastoid air cells are not opacified. Vascular calcifications. CT CERVICAL SPINE FINDINGS Straightening of the usual cervical lordosis. This may be due to patient positioning or degenerative changes but ligamentous injury or muscle spasm could also have this appearance and are not excluded. No anterior subluxation. Normal alignment of the facet joints. Degenerative changes throughout the cervical spine with narrowed interspaces and associated endplate hypertrophic changes. No vertebral compression  deformities. No prevertebral soft tissue swelling. C1-2 articulation appears intact. No focal bone lesion or bone destruction. Soft tissues are unremarkable. Severe bullous emphysematous changes demonstrated in the lung apices. IMPRESSION: No acute intracranial abnormalities. Chronic atrophy and small vessel ischemic changes. Nonspecific straightening of the usual cervical lordosis. Diffuse degenerative changes. No acute displaced fractures identified in the cervical spine. Electronically Signed   By: Burman Nieves M.D.   On: 07/21/2015 23:44   Ct Abdomen Pelvis W Contrast  07/22/2015  CLINICAL DATA:  75 year old male with fall and anterior chest pain abdominal pain. Left hip pain. EXAM: CT ABDOMEN AND PELVIS WITH CONTRAST TECHNIQUE: Multidetector CT imaging of the  abdomen and pelvis was performed using the standard protocol following bolus administration of intravenous contrast. CONTRAST:  OMNIPAQUE IOHEXOL 300 MG/ML  SOLN COMPARISON:  Lumbar spine Radiograph dated 07/21/2015 and CT dated 04/29/2015 FINDINGS: There is emphysematous changes of the lung bases. No intra-abdominal free air or free fluid. There multiple stones within the gallbladder. No pericholecystic fluid or evidence of inflammatory changes of the gallbladder by CT. Ultrasound may provide better evaluation of the gallbladder if clinically indicated. The liver, pancreas, spleen, adrenal glands appear unremarkable. The kidneys, visualized ureters appear unremarkable. The urinary bladder is predominantly collapsed. There is apparent diffuse thickening of the bladder wall which may be partly related to underdistention. Cystitis is not excluded. Correlation with urinalysis recommended. Evaluation of the bowel is limited in the absence of oral contrast. There is moderate stool throughout the colon. No evidence of bowel obstruction or inflammation. There is segmental area of thickening in the distal aspect of the ascending colon which case  related to underdistention. Underlying lesion is not excluded. Colonoscopy may provide better evaluation. The appendix is not visualized with certainty. No inflammatory changes identified in the right lower quadrant. The abdominal aorta appears unremarkable. An infrarenal IVC filter is noted. No portal venous gas identified. There is no adenopathy. The abdominal wall soft tissues appear unremarkable. There is chronic sclerotic changes of the pelvic bone with trabecular thickening, possibly related to underlying Paget's disease. L5-S1 posterior fixation hardware. Multiple fixation screws noted in the left iliac bone. A stable appearing lucency in the inferior aspect of the L3 vertebra likely represent a Schmorl's node. No acute fracture identified. IMPRESSION: No acute/traumatic intra-abdominal or pelvic pathology. 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 Stable postsurgical changes of the left iliac bone with multiple fixation screws. No acute2 fracture. Cholelithiasis. Segmental thickening of the distal ascending colon possibly related to underdistention. Underlying lesion is not excluded. Electronically Signed   By: Elgie Collard M.D.   On: 07/22/2015 02:33   Dg Hip Unilat With Pelvis 2-3 Views Left  07/22/2015  CLINICAL DATA:  Fall. EXAM: DG HIP (WITH OR WITHOUT PELVIS) 2-3V LEFT COMPARISON:  None. FINDINGS: Diffuse bone demineralization. Trabecular coarsening in the pelvis and right hip may indicate Paget's disease. Degenerative changes in the hips. No evidence of acute fracture or dislocation of the pelvis or left hip. Bone cortex appears intact. SI joints and symphysis pubis are nondisplaced. Postoperative changes with posterior fixation of the lower lumbar spine and multiple screws and plates fixing the left iliac bone. Inferior vena caval filter. IMPRESSION: No acute bony abnormalities. Trabecular coarsening suggest possible Paget's  disease. Degenerative changes in the hips. Postoperative changes in the lower lumbar spine and pelvis. Electronically Signed   By: Burman Nieves M.D.   On: 07/22/2015 00:31    Assessment/Plan Principal Problem:   Fall Active Problems:   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)   Hypertension   COPD (chronic obstructive pulmonary disease) (HCC)   HLD (hyperlipidemia)   Depression   Rhabdomyolysis   Hypokalemia   Fall: Patient seems to have mechanic fall last night, but he has been having frequent fall recently. Etiology is not clear. He states that he has left leg weakness, but physical examination did not show left leg weakness compared to the right leg. No acute bony fractures on images.  -will admit to tele bed -check orthostatic vital signs -MRI of brain to rule out TIA/stroke -PT/OT -Bedside swallowing screen  -Pain control: when necessary  Percocet and morphine  Rhabdomyolysis: CK 5464. Creatinine 1.13. -IV fluid: Normal saline 1 L, then 125 mL per hour -Repeat CK at 10 AM. -Hold Mobic  HTN: Blood pressure is elevated at 181/118, which is likely due to pain. -Continue amlodipine -IV hydralazine when necessary  COPD (chronic obstructive pulmonary disease) (HCC): Stable -Continue spiriva inhaler -PRN albuterol nebs  HLD: Last LDL was 70 on 04/29/15 -Continue home medications: Pravastatin  GERD: -Protonix  BPH: -continue home Cardura, Proscar, Mirabegron  Depression: Stable, no suicidal or homicidal ideations. -Continue home medications: Prozac, mirtazapine  Hypokalemia: K= 3.1 on admission. - Repleted - Check Mg level   DVT ppx: SQ Lovenox  Code Status: Full code Family Communication: None at bed side. Disposition Plan: Admit to inpatient   Date of Service 07/22/2015    Lorretta HarpIU, Dontavious Emily [facility] Pager [phone]  If 7PM-7AM, please contact night-coverage [email or web address] Password [email or web address] 07/22/2015, 4:16 AM

## 2015-07-22 NOTE — ED Notes (Signed)
Changed pt's gown, linens and blankets

## 2015-07-22 NOTE — ED Notes (Signed)
Pt had an incontinence episode of urine, pt oriented to pleas notified us when he needs to make urine so we can collect the sample. Pt verbalize understanding.

## 2015-07-22 NOTE — Progress Notes (Signed)
OT Cancellation Note  Patient Details Name: Joya Sanmmette Betty MRN: [id number] DOB: [birth year]   Cancelled Treatment:    Reason Eval/Treat Not Completed: Medical issues which prohibited therapy .  Noted BP out of parameters (162/104).  Will check back if schedule permits or tomorrow.  Thanh Mottern 07/22/2015, 3:02 PM  Marica OtterMaryellen Sayaka Hoeppner, OTR/L [phone] 07/22/2015

## 2015-07-22 NOTE — ED Notes (Signed)
Cleaned pt and changed depends

## 2015-07-22 NOTE — ED Notes (Signed)
hospitalist at the bedside 

## 2015-07-22 NOTE — Progress Notes (Signed)
Patient seen and examined this morning, admitted overnight, H&P reviewed.   75 y.o. male with PMH of hypertension, hyperlipidemia, COPD, GERD, depression, small bowel obstruction, Paget's bone disease, kidney stone, gallstone, schizophrenia on admission, BPH, who presents with pain over neck, back, left chest wall, abdomen and head after fall.    Fall  - Patient seems to have mechanic fall the night prior to admission, but he has been having frequent fall recently. - He states that he has left leg weakness, but physical examination did not show left leg weakness compared to the right leg. No acute bony fractures on images. - PT to evaluate - MRI of brain to rule out TIA/stroke negative for acute findings however showed cerebral atrophy, chronic small vessel ischemic disease and scattered remote lacunar and cerebellar infarcts. Suspect this is the main cause for his balance issues  Rhabdomyolysis  - CK 5464. Creatinine 1.13 on admission - Repeat CK at 10 AM even higher at 13K, continue fluids, monitor renal function - Hold Mobic  HTN - Blood pressure is elevated at 181/118, which is likely due to pain. - Continue amlodipine - IV hydralazine when necessary  COPD (chronic obstructive pulmonary disease) - Continue spiriva inhaler - PRN albuterol nebs  HLD  - Last LDL was 70 on 04/29/15 - Continue home medications: Pravastatin  GERD - Protonix  BPH - continue home Cardura, Proscar, Mirabegron  Depression - Continue home medications: Prozac, mirtazapine  Hypokalemia - K= 3.1 on admission. - Repleted - Mg low, replete  Severe spinal stenosis - evaluated by Dr. Jeral FruitBotero in Dec 2016, outpatient follow up. Patient has not seen Dr Jeral FruitBotero, states "nobody gave me an appointment". Will schedule one. - may also contribute to his falls   Costin M. Elvera LennoxGherghe, MD [facility] [phone]

## 2015-07-22 NOTE — ED Notes (Signed)
Assisted pt with breakfast tray, pt had good appetite and ate almost all of his food.

## 2015-07-22 NOTE — ED Notes (Signed)
Pt passed the swallow screen 

## 2015-07-22 NOTE — ED Notes (Signed)
Changed pt's depends

## 2015-07-23 ENCOUNTER — Inpatient Hospital Stay (HOSPITAL_COMMUNITY): Payer: Medicare Other

## 2015-07-23 DIAGNOSIS — R079 Chest pain, unspecified: Secondary | ICD-10-CM

## 2015-07-23 LAB — BASIC METABOLIC PANEL
ANION GAP: 10 (ref 5–15)
BUN: 6 mg/dL (ref 6–20)
CHLORIDE: 110 mmol/L (ref 101–111)
CO2: 23 mmol/L (ref 22–32)
Calcium: 8.7 mg/dL — ABNORMAL LOW (ref 8.9–10.3)
Creatinine, Ser: 0.93 mg/dL (ref 0.61–1.24)
GFR calc Af Amer: >60 mL/min (ref >60)
Glucose, Bld: 115 mg/dL — ABNORMAL HIGH (ref 65–99)
POTASSIUM: 4.3 mmol/L (ref 3.5–5.1)
SODIUM: 143 mmol/L (ref 135–145)

## 2015-07-23 LAB — CBC
HEMATOCRIT: 41.4 % (ref 39.0–52.0)
HEMOGLOBIN: 13 g/dL (ref 13.0–17.0)
MCH: 27.4 pg (ref 26.0–34.0)
MCHC: 31.4 g/dL (ref 30.0–36.0)
MCV: 87.2 fL (ref 78.0–100.0)
Platelets: 142 10*3/uL — ABNORMAL LOW (ref 150–400)
RBC: 4.75 MIL/uL (ref 4.22–5.81)
RDW: 15.7 % — ABNORMAL HIGH (ref 11.5–15.5)
WBC: 6.9 10*3/uL (ref 4.0–10.5)

## 2015-07-23 LAB — CK: Total CK: 7065 U/L — ABNORMAL HIGH (ref 49–397)

## 2015-07-23 LAB — GLUCOSE, CAPILLARY: GLUCOSE-CAPILLARY: 101 mg/dL — AB (ref 65–99)

## 2015-07-23 LAB — TROPONIN I

## 2015-07-23 MED ORDER — GI COCKTAIL ~~LOC~~
30.0000 mL | Freq: Three times a day (TID) | ORAL | Status: DC | PRN
  Administered 2015-07-23: 30 mL via ORAL
  Filled 2015-07-23: qty 30

## 2015-07-23 MED ORDER — SODIUM CHLORIDE 0.9 % IV SOLN
INTRAVENOUS | Status: DC
Start: 2015-07-23 — End: 2015-07-23
  Administered 2015-07-23: 12:00:00 via INTRAVENOUS

## 2015-07-23 NOTE — Care Management Note (Signed)
Case Management Note  Patient Details  Name: [name] MRN: [id number] Date of Birth: 1941/02/21  Subjective/Objective: 75 y.o M  admitted 07/21/2015 for fall with Rhabdomylosis. OT evaluation recommending SNF placement. Will refer to CSW. CM will continue to follow for additional CM needs should they arise.      Clinical Impression                        Action/Plan:   Expected Discharge Date:                  Expected Discharge Plan:     In-House Referral:     Discharge planning Services     Post Acute Care Choice:    Choice offered to:     DME Arranged:    DME Agency:     HH Arranged:    HH Agency:     Status of Service:     Medicare Important Message Given:    Date Medicare IM Given:    Medicare IM give by:    Date Additional Medicare IM Given:    Additional Medicare Important Message give by:     If discussed at Long Length of Stay Meetings, dates discussed:    Additional Comments:  [name], [name] M, RN 07/23/2015, 2:08 PM

## 2015-07-23 NOTE — Progress Notes (Signed)
Patient is complaining of lower back pain and a sharp midsternal chest pain.  Positive Shortness of breath and says that it hurts more when he talks.  Paged MD for orders.  Will do chest xray, labs, EKG, med for indigestion.  Will page md with EKG is complete

## 2015-07-23 NOTE — Progress Notes (Addendum)
PROGRESS NOTE  Norman Davis ZOX:[id number] DOB: 01-Apr-1941 DOA: 07/21/2015 PCP: Pcp Not In System Outpatient Specialists:    LOS: 1 day   Brief Narrative: 75 y.o. male with PMH of hypertension, hyperlipidemia, COPD, GERD, depression, small bowel obstruction, Paget's bone disease, kidney stone, gallstone, schizophrenia on admission, BPH, who presents with pain over neck, back, left chest wall, abdomen and head after fall.  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 - He states that he has left leg weakness, but physical examination did not show left leg weakness compared to the right leg. No acute bony fractures on images. - MRI of brain to rule out TIA/stroke negative for acute findings however showed cerebral atrophy, chronic small vessel ischemic disease and scattered remote lacunar and cerebellar infarcts. Suspect this is the main cause for his balance issues - PT to evaluate  Rhabdomyolysis  - CK 5464. Creatinine 1.13 on admission - Hold Mobic - CK improving however still elevated, continue IVF  HTN - Blood pressure is elevated at 181/118, which is likely due to pain. - Continue amlodipine - IV hydralazine when necessary  COPD (chronic obstructive pulmonary disease) - Continue spiriva inhaler - PRN albuterol nebs  Chest pain - called for chest pain and back pain later in the afternoon, resolved - EKG similar to prior tracings - troponin negative - CXR without acute process - cycle CEs overnight  HLD  - Last LDL was 70 on 04/29/15 - Continue home medications: Pravastatin  GERD - Protonix  BPH - continue home Cardura, Proscar,  Mirabegron  Depression - Continue home medications: Prozac, mirtazapine  Hypokalemia - K= 3.1 on admission. - Repleted, improved - Mg low, replete  Severe spinal stenosis - evaluated by Dr. Jeral FruitBotero in Dec 2016, outpatient follow up. Patient has not seen Dr Jeral FruitBotero, states "nobody gave me an appointment". Will schedule one. - may also contribute to his falls   DVT prophylaxis: Lovenox Code Status: Full Family Communication: no family bedside Disposition Plan: PT to eval, may need SNF Barriers for discharge: rhabdo  Consultants:   None   Procedures:   None   Antimicrobials:  None    Subjective: - doing well, no chest pain, no dyspnea. Endorsing back pain, chronic  Objective: Filed Vitals:   07/23/15 0626 07/23/15 0817 07/23/15 1048 07/23/15 1112  BP: 145/80  134/86 166/111  Pulse: 84  101 111  Temp: 97.3 F (36.3 C)     TempSrc: Axillary     Resp: 18     Height:      Weight:      SpO2: 96% 92% 96% 96%    Intake/Output Summary (Last 24 hours) at 07/23/15 1148 Last data filed at 07/23/15 0930  Gross per 24 hour  Intake 2063.75 ml  Output   1300 ml  Net 763.75 ml   [facility]   07/21/15 2114 07/22/15 1438  Weight: 86.637 kg (191 lb) 82.555 kg (182 lb)    Examination: BP 166/111 mmHg  Pulse 111  Temp(Src) 97.3 F (36.3 C) (Axillary)  Resp 18  Ht 5\' 9"  (1.753 m)  Wt 82.555 kg (182 lb)  BMI 26.86 kg/m2  SpO2 96%  GENERAL:  NAD  HEENT: head NCAT, no scleral icterus. Pupils round and reactive.  LUNGS: Clear to auscultation. No wheezing or crackles  HEART: Regular rate and rhythm without murmur. 2+ pulses, no JVD, no peripheral edema  ABDOMEN: Soft, nontender, and nondistended. Positive bowel sounds.   EXTREMITIES: Without any cyanosis or clubbing  NEUROLOGIC: Alert and oriented x3. Non focal  PSYCH: flat affect   Data Reviewed: I have personally reviewed following labs and imaging studies  CBC:  Recent Labs Lab 07/22/15 0011  07/22/15 0455 07/23/15 0532  WBC 7.3 6.7 6.9  NEUTROABS 4.9  --   --   HGB 11.4* 12.4* 13.0  HCT 35.7* 38.8* 41.4  MCV 86.2 86.8 87.2  PLT 177 183 142*   Basic Metabolic Panel:  Recent Labs Lab 07/22/15 0011 07/22/15 0455 07/23/15 0532  NA 147* 145 143  K 3.2* 3.4* 4.3  CL 112* 111 110  CO2 GLUCOSE 87 84 115*  BUN CREATININE 1.13 1.04 0.93  CALCIUM 9.1 8.7* 8.7*  MG  --  1.5*  --    GFR: Estimated Creatinine Clearance: 67.6 mL/min (by C-G formula based on Cr of 0.93). Liver Function Tests: No results for input(s): AST, ALT, ALKPHOS, BILITOT, PROT, ALBUMIN in the last 168 hours. No results for input(s): LIPASE, AMYLASE in the last 168 hours. No results for input(s): AMMONIA in the last 168 hours. Coagulation Profile: No results for input(s): INR, PROTIME in the last 168 hours. Cardiac Enzymes:  Recent Labs Lab 07/22/15 0011 07/22/15 1020 07/23/15 0532  CKTOTAL 5464* 13077* 7065*   BNP (last 3 results) No results for input(s): PROBNP in the last 8760 hours. HbA1C: No results for input(s): HGBA1C in the last 72 hours. CBG:  Recent Labs Lab 07/22/15 1012 07/23/15 0810  GLUCAP 111* 101*   Lipid Profile: No results for input(s): CHOL, HDL, LDLCALC, TRIG, CHOLHDL, LDLDIRECT in the last 72 hours. Thyroid Function Tests: No results for input(s): TSH, T4TOTAL, FREET4, T3FREE, THYROIDAB in the last 72 hours. Anemia Panel: No results for input(s): VITAMINB12, FOLATE, FERRITIN, TIBC, IRON, RETICCTPCT in the last 72 hours. Urine analysis:    Component Value Date/Time   COLORURINE YELLOW 07/22/2015 0253   APPEARANCEUR CLEAR 07/22/2015 0253   LABSPEC 1.033* 07/22/2015 0253   PHURINE 6.5 07/22/2015 0253   GLUCOSEU NEGATIVE 07/22/2015 0253   HGBUR LARGE* 07/22/2015 0253   BILIRUBINUR NEGATIVE 07/22/2015 0253   KETONESUR 15* 07/22/2015 0253   PROTEINUR NEGATIVE 07/22/2015 0253   UROBILINOGEN 1.0 03/16/2013 1602   NITRITE NEGATIVE 07/22/2015  0253   LEUKOCYTESUR NEGATIVE 07/22/2015 0253   Sepsis Labs: Invalid input(s): PROCALCITONIN, LACTICIDVEN  No results found for this or any previous visit (from the past 240 hour(s)).    Radiology Studies: Dg Chest 2 View  07/22/2015  CLINICAL DATA:  75 year old male with fall and left hip pain. Anterior chest wall pain. EXAM: CHEST  2 VIEW COMPARISON:  Radiograph dated 06/12/2015 FINDINGS: Two views of the chest demonstrate emphysematous changes of the lungs. There is no focal consolidation, pleural effusion, or pneumothorax. Stable cardiac silhouette. There is degenerative changes of the spine. No acute fracture. IMPRESSION: No acute intrathoracic pathology. Emphysema. Electronically Signed   By: Elgie Collard M.D.   On: 07/22/2015 00:32   Dg Lumbar Spine Complete  07/21/2015  CLINICAL DATA:  Fall today.  Low back pain.  Prior lumbar surgery. EXAM: LUMBAR SPINE - COMPLETE 4+ VIEW COMPARISON:  CT abdomen and pelvis 04/29/2015. Lumbar spine  MRI 04/28/2015. FINDINGS: Sequelae of L5-S1 posterior fusion are again identified. Advanced L4-5 disc degeneration is again seen with severe disc space height loss and degenerative endplate spurring. Vertebral body heights are preserved without evidence of compression fracture. Slight anterolisthesis of L4 on L5 is grossly unchanged. No pars defects are identified. Extensive prior internal fixation is noted involving the left iliac bone with screws traversing the left SI joint. 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 An IVC filter is noted at the L2 level. IMPRESSION: 1. Prior L5-S1 posterior fusion without radiographic evidence of are were complication. 2. 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 Electronically Signed   By: Sebastian Ache M.D.   On: 07/21/2015 23:32   Ct Head Wo Contrast  07/21/2015  CLINICAL DATA:  Fall today. No loss of consciousness. History of hypertension. EXAM: CT  HEAD WITHOUT CONTRAST CT CERVICAL SPINE WITHOUT CONTRAST TECHNIQUE: Multidetector CT imaging of the head and cervical spine was performed following the standard protocol without intravenous contrast. Multiplanar CT image reconstructions of the cervical spine were also generated. COMPARISON:  None. FINDINGS: CT HEAD FINDINGS Diffuse cerebral atrophy. Ventricular dilatation consistent with central atrophy. Low-attenuation changes in the deep white matter are nonspecific but consistent with small vessel ischemia. No mass effect or midline shift. No abnormal extra-axial fluid collections. Gray-white matter junctions are distinct. Basal cisterns are not effaced. No evidence of acute intracranial hemorrhage. No depressed skull fractures. Mild mucosal thickening in the paranasal sinuses. Mastoid air cells are not opacified. Vascular calcifications. CT CERVICAL SPINE FINDINGS Straightening of the usual cervical lordosis. This may be due to patient positioning or degenerative changes but ligamentous injury or muscle spasm could also have this appearance and are not excluded. No anterior subluxation. Normal alignment of the facet joints. Degenerative changes throughout the cervical spine with narrowed interspaces and associated endplate hypertrophic changes. No vertebral compression deformities. No prevertebral soft tissue swelling. C1-2 articulation appears intact. No focal bone lesion or bone destruction. Soft tissues are unremarkable. Severe bullous emphysematous changes demonstrated in the lung apices. IMPRESSION: No acute intracranial abnormalities. Chronic atrophy and small vessel ischemic changes. Nonspecific straightening of the usual cervical lordosis. Diffuse degenerative changes. No acute displaced fractures identified in the cervical spine. Electronically Signed   By: Burman Nieves M.D.   On: 07/21/2015 23:44   Ct Cervical Spine Wo Contrast  07/21/2015  CLINICAL DATA:  Fall today. No loss of consciousness.  History of hypertension. EXAM: CT HEAD WITHOUT CONTRAST CT CERVICAL SPINE WITHOUT CONTRAST TECHNIQUE: Multidetector CT imaging of the head and cervical spine was performed following the standard protocol without intravenous contrast. Multiplanar CT image reconstructions of the cervical spine were also generated. COMPARISON:  None. FINDINGS: CT HEAD FINDINGS Diffuse cerebral atrophy. Ventricular dilatation consistent with central atrophy. Low-attenuation changes in the deep white matter are nonspecific but consistent with small vessel ischemia. No mass effect or midline shift. No abnormal extra-axial fluid collections. Gray-white matter junctions are distinct. Basal cisterns are not effaced. No evidence of acute intracranial hemorrhage. No depressed skull fractures. Mild mucosal thickening in the paranasal sinuses. Mastoid air cells are not opacified. Vascular calcifications. CT CERVICAL SPINE FINDINGS Straightening of the usual cervical lordosis. This may be due to patient positioning or degenerative changes but ligamentous injury or muscle spasm could also have this appearance and are not excluded. No anterior subluxation. Normal alignment of the facet joints. Degenerative changes throughout the cervical spine with narrowed interspaces and associated endplate hypertrophic  changes. No vertebral compression deformities. No prevertebral soft tissue swelling. C1-2 articulation appears intact. No focal bone lesion or bone destruction. Soft tissues are unremarkable. Severe bullous emphysematous changes demonstrated in the lung apices. IMPRESSION: No acute intracranial abnormalities. Chronic atrophy and small vessel ischemic changes. Nonspecific straightening of the usual cervical lordosis. Diffuse degenerative changes. No acute displaced fractures identified in the cervical spine. Electronically Signed   By: Burman Nieves M.D.   On: 07/21/2015 23:44   Mr Brain Wo Contrast  07/22/2015  CLINICAL DATA:  Initial  valuation for frequent falls. Evaluate for possible stroke. EXAM: MRI HEAD WITHOUT CONTRAST TECHNIQUE: Multiplanar, multiecho pulse sequences of the brain and surrounding structures were obtained without intravenous contrast. COMPARISON:  Prior CT from 07/21/2015. FINDINGS: Diffuse prominence of the CSF containing spaces compatible with moderate generalized cerebral atrophy. Patchy T2/FLAIR hyperintensity within the periventricular and deep white matter both cerebral hemispheres most consistent with chronic small vessel ischemic disease. Prominent chronic small vessel ischemic disease present within the pons. Scattered remote lacunar infarcts within the thalami and basal ganglia bilaterally. Additional remote lacunar infarct within the right corona radiata. Probable scatter remote bilateral cerebellar infarcts also noted. No abnormal foci of restricted diffusion to suggest acute intracranial infarct. Gray-white matter differentiation maintained. Major intracranial vascular flow voids preserved. Mild vertebrobasilar dolichoectasia noted. No acute intracranial hemorrhage. Single chronic probable punctate micro hemorrhage noted within the left upper lobe. No mass lesion, midline shift, or mass effect. No hydrocephalus. No extra-axial fluid collection. Major dural sinuses are grossly patent. Craniocervical junction within normal limits. Scattered degenerative spondylolysis noted within the visualized upper cervical spine without significant stenosis. Pituitary gland normal. No acute abnormality about the orbits. Sequela prior lens extraction noted on the left. Scattered mucosal thickening within the paranasal sinuses. No air-fluid levels to suggest active sinus infection. Small right mastoid effusion. Inner ear structure grossly normal. Bone marrow signal intensity within normal limits. No scalp soft tissue abnormality. IMPRESSION: 1. No acute intracranial infarct or other abnormality identified. 2. Moderate cerebral  atrophy with chronic small vessel ischemic disease and scattered remote lacunar and cerebellar infarcts as above. Electronically Signed   By: Rise Mu M.D.   On: 07/22/2015 06:37   Ct Abdomen Pelvis W Contrast  07/22/2015  CLINICAL DATA:  75 year old male with fall and anterior chest pain abdominal pain. Left hip pain. EXAM: CT ABDOMEN AND PELVIS WITH CONTRAST TECHNIQUE: Multidetector CT imaging of the abdomen and pelvis was performed using the standard protocol following bolus administration of intravenous contrast. CONTRAST:  OMNIPAQUE IOHEXOL 300 MG/ML  SOLN COMPARISON:  Lumbar spine Radiograph dated 07/21/2015 and CT dated 04/29/2015 FINDINGS: There is emphysematous changes of the lung bases. No intra-abdominal free air or free fluid. There multiple stones within the gallbladder. No pericholecystic fluid or evidence of inflammatory changes of the gallbladder by CT. Ultrasound may provide better evaluation of the gallbladder if clinically indicated. The liver, pancreas, spleen, adrenal glands appear unremarkable. The kidneys, visualized ureters appear unremarkable. The urinary bladder is predominantly collapsed. There is apparent diffuse thickening of the bladder wall which may be partly related to underdistention. Cystitis is not excluded. Correlation with urinalysis recommended. Evaluation of the bowel is limited in the absence of oral contrast. There is moderate stool throughout the colon. No evidence of bowel obstruction or inflammation. There is segmental area of thickening in the distal aspect of the ascending colon which case related to underdistention. Underlying lesion is not excluded. Colonoscopy may provide better evaluation. The appendix is not  visualized with certainty. No inflammatory changes identified in the right lower quadrant. The abdominal aorta appears unremarkable. An infrarenal IVC filter is noted. No portal venous gas identified. There is no adenopathy. The abdominal  wall soft tissues appear unremarkable. There is chronic sclerotic changes of the pelvic bone with trabecular thickening, possibly related to underlying Paget's disease. L5-S1 posterior fixation hardware. Multiple fixation screws noted in the left iliac bone. A stable appearing lucency in the inferior aspect of the L3 vertebra likely represent a Schmorl's node. No acute fracture identified. IMPRESSION: No acute/traumatic intra-abdominal or pelvic pathology. 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 Stable postsurgical changes of the left iliac bone with multiple fixation screws. No acute2 fracture. Cholelithiasis. Segmental thickening of the distal ascending colon possibly related to underdistention. Underlying lesion is not excluded. Electronically Signed   By: Elgie Collard M.D.   On: 07/22/2015 02:33   Dg Hip Unilat With Pelvis 2-3 Views Left  07/22/2015  CLINICAL DATA:  Fall. EXAM: DG HIP (WITH OR WITHOUT PELVIS) 2-3V LEFT COMPARISON:  None. FINDINGS: Diffuse bone demineralization. Trabecular coarsening in the pelvis and right hip may indicate Paget's disease. Degenerative changes in the hips. No evidence of acute fracture or dislocation of the pelvis or left hip. Bone cortex appears intact. SI joints and symphysis pubis are nondisplaced. Postoperative changes with posterior fixation of the lower lumbar spine and multiple screws and plates fixing the left iliac bone. Inferior vena caval filter. IMPRESSION: No acute bony abnormalities. Trabecular coarsening suggest possible Paget's disease. Degenerative changes in the hips. Postoperative changes in the lower lumbar spine and pelvis. Electronically Signed   By: Burman Nieves M.D.   On: 07/22/2015 00:31     Scheduled Meds: . amLODipine  10 mg Oral Daily  . clopidogrel  75 mg Oral Q breakfast  . dextromethorphan-guaiFENesin  1 tablet Oral BID  . doxazosin  8 mg Oral QHS  . enoxaparin  (LOVENOX) injection  40 mg Subcutaneous Daily  . finasteride  5 mg Oral Daily  . FLUoxetine  40 mg Oral Daily  . loratadine  10 mg Oral Daily  . mirabegron ER  25 mg Oral Daily  . mirtazapine  30 mg Oral QHS  . pantoprazole  40 mg Oral Daily  . pravastatin  40 mg Oral QHS  . sodium chloride flush  3 mL Intravenous Q12H  . tiotropium  18 mcg Inhalation Daily   Continuous Infusions: . sodium chloride 125 mL/hr at 07/23/15 0038     Pamella Pert, MD, PhD [facility] Pager [phone]  If 7PM-7AM, please contact night-coverage [email or web address] Password [facility] 07/23/2015, 11:48 AM

## 2015-07-23 NOTE — Evaluation (Signed)
Physical Therapy Evaluation Patient Details Name: Marquice Uddin MRN: [id number] DOB: February 23, 1941 Today's Date: 07/23/2015   History of Present Illness  Emmette Decou is a 75 y.o. male with PMH of hypertension, hyperlipidemia, COPD, GERD, depression, small bowel obstruction, Paget's bone disease, kidney stone, gallstone, schizophrenia on admission, BPH, who presents with pain over neck, back, left chest wall, abdomen and head after fall. Pt was admitted w/ dx: Fall, Rhabdomyloysis.  Clinical Impression  On eval, pt required Mod assist for mobility. Sat EOB ~3-4 minutes. Stood at EOB with RW ~15-20 seconds. Pt was also able to take a few side steps towards [facility] before he became fatigued and requested to sit back down. Assisted pt back to bed. Recommend ST rehab at SNF for continued PT.     Follow Up Recommendations SNF    Equipment Recommendations  None recommended by PT    Recommendations for Other Services       Precautions / Restrictions Precautions Precautions: Fall      Mobility  Bed Mobility Overal bed mobility: Needs Assistance Bed Mobility: Supine to Sit;Sit to Sidelying     Supine to sit: Min assist Sit to supine: Max assist Sit to sidelying: Mod assist General bed mobility comments: Assist at trunk and LEs. Increased time. Utilized pad to aid with scooting, positioning. Moderate reliance on bedaril. HOB 60 degrees  Transfers Overall transfer level: Needs assistance Equipment used: Rolling walker (2 wheeled) Transfers: Sit to/from Stand Sit to Stand: Mod assist;From elevated surface         General transfer comment: assist to rise, stabilize, control descent. VCs safety, technique, hand placement. Pt stood at EOB ~15-20 seconds.  Ambulation/Gait Ambulation/Gait assistance: Mod assist   Assistive device: Rolling walker (2 wheeled)       General Gait Details: side steps x 2 with RW towards HOB. Increased time.   Stairs            Wheelchair Mobility     Modified Rankin (Stroke Patients Only)       Balance Overall balance assessment: History of Falls Sitting-balance support: Feet supported Sitting balance-Leahy Scale: Fair Sitting balance - Comments: Pt requires verbal and tactile cues secondary to right lateral lean in sitting - initially poor sitting balance, but able to improve to fair with cuing/time.   Standing balance support: During functional activity Standing balance-Leahy Scale: Poor                               Pertinent Vitals/Pain Pain Assessment: No/denies pain    Home Living Family/patient expects to be discharged to:: Private residence Living Arrangements: Other relatives;Other (Comment) Available Help at Discharge: Family;Available 24 hours/day Type of Home: House Home Access: Stairs to enter Entrance Stairs-Rails: None   Home Layout: Two level;Bed/bath upstairs Home Equipment: Walker - 2 wheels;Cane - single point;Shower seat Additional [facility]    Prior Function Level of Independence: Needs assistance   Gait / Transfers Assistance Needed: uses straight cane for ambulation  ADL's / Homemaking Assistance Needed: Pt states his sister assists PRN with bathing and dressing. She does cooking/meal prep and cleaning        Hand Dominance   Dominant Hand: Right    Extremity/Trunk Assessment   Upper Extremity Assessment: Generalized weakness           Lower Extremity Assessment: Generalized weakness      Cervical / Trunk  Assessment: Normal  Communication   Communication: No difficulties  Cognition Arousal/Alertness: Awake/alert Behavior During Therapy: WFL for tasks assessed/performed Overall Cognitive Status: Within Functional Limits for tasks assessed                      General Comments      Exercises        Assessment/Plan    PT Assessment Patient needs continued PT services  PT Diagnosis Difficulty  walking;Generalized weakness   PT Problem List Decreased strength;Decreased activity tolerance;Decreased balance;Decreased mobility;Decreased knowledge of use of DME  PT Treatment Interventions DME instruction;Gait training;Functional mobility training;Therapeutic activities;Patient/family education;Balance training;Therapeutic exercise   PT Goals (Current goals can be found in the Care Plan section) Acute Rehab PT Goals Patient Stated Goal: None stated PT Goal Formulation: With patient Time For Goal Achievement: 08/06/15 Potential to Achieve Goals: Good    Frequency Min 3X/week   Barriers to discharge        Co-evaluation               End of Session Equipment Utilized During Treatment: Gait belt Activity Tolerance: Patient tolerated treatment well Patient left: in bed;with call bell/phone within reach;with bed alarm set           Time: [id number] PT Time Calculation (min) (ACUTE ONLY): 14 min   Charges:   PT Evaluation $PT Eval Low Complexity: 1 Procedure     PT G Codes:        Rebeca AlertJannie Tavaria Mackins, MPT Pager: [phone]

## 2015-07-23 NOTE — Evaluation (Signed)
Occupational Therapy Evaluation Patient Details Name: Norman Davis MRN: [id number] DOB: 1940/11/19 Today's Date: 07/23/2015    History of Present Illness Norman Davis is a 75 y.o. male with PMH of hypertension, hyperlipidemia, COPD, GERD, depression, small bowel obstruction, Paget's bone disease, kidney stone, gallstone, schizophrenia on admission, BPH, who presents with pain over neck, back, left chest wall, abdomen and head after fall. Pt was admitted w/ dx: Fall, Rhabdomyloysis.   Clinical Impression   Pt admitted as above w/ h/o falls, currently demonstrating deficits in his ability to perform ADL/functional transfers (see OT problem list below) whom should benefit from acute OT to assist in maximizing independence w/ self care tasks and decreased burden of care. During assessment, BP in sitting was 166/111, pulse 111, O2 96% on room air. Pt was assisted back to bed/no OOB activity was performed & nurse tech was notified.    Follow Up Recommendations  SNF;Supervision/Assistance - 24 hour    Equipment Recommendations  Other (comment) (To be determined at next venue)    Recommendations for Other Services       Precautions / Restrictions Precautions Precautions: Fall Precaution Comments: Monitor BP      Mobility Bed Mobility Overal bed mobility: Needs Assistance Bed Mobility: Supine to Sit;Sit to Supine     Supine to sit: Mod assist;HOB elevated Sit to supine: Max assist   General bed mobility comments: Assist at trunk and LE's, increased time. Assist with pad, required verbal/tactile cues to correct right lateral lean in sitting noted. BP became elevated and pt was assisted back to bed w/ max assist to reposition in bed (trunk and LE's).  Transfers Overall transfer level:  (NT secondary to elevated BP in sitting EOB)                    Balance Overall balance assessment: Needs assistance Sitting-balance support: Bilateral upper extremity supported;Feet  supported Sitting balance-Leahy Scale: Fair Sitting balance - Comments: Pt requires verbal and tactile cues secondary to right lateral lean in sitting - initially poor sitting balance, but able to improve to fair with cuing/time.                                    ADL Overall ADL's : Needs assistance/impaired Eating/Feeding: Minimal assistance;Set up;Bed level   Grooming: Set up;Minimal assistance;Bed level           Upper Body Dressing : Set up;Min guard;Sitting   Lower Body Dressing: Moderate assistance;Bed level               Functional mobility during ADLs:  (No OOB activity today secondary to BP elevated in sitting up EOB, need to assess.) General ADL Comments: Pt was educated in role of OT and agreeable to functional transfer from EOB to chair after assessment today, however, BP became elveated in sitting (166/11, pulse 11, O2 96% on room air0. Pt was assisted back to bed, repositioned for comfort. Pt sat EOB ~ total. Note that pt initially lean to left in sitting but was able to correct with verbal and tactile cues x3 to sit w/ supervision level. Discussed SNF for possible STR and appears agreeable to this as his bedroom is on second floor (16 steps) and h/o falls , 1/2 bath on first floor. No family present during assessment. Pt states his sister does not have a car and needs to wait for cousin to bring her.  Vision  Wears glasses at all times per pt report. No change from baseline - "My glasses are at home, I came by ambulance" Pt was educated to ask family to bring glasses when they are able. He verbalized understanding of this.   Perception     Praxis      Pertinent Vitals/Pain Pain Assessment: Faces Faces Pain Scale: Hurts little more Pain Location: Low back & left leg Pain Descriptors / Indicators: Aching;Grimacing Pain Intervention(s): Limited activity within patient's tolerance;Monitored during session;Repositioned     Hand Dominance  Right   Extremity/Trunk Assessment Upper Extremity Assessment Upper Extremity Assessment: Overall WFL for tasks assessed;Generalized weakness (Pt with limitations at end range shoulder flexion. Able to reach above and behind head with increased time. Bilateral hands w/ Moderate edema noted, decreased coordination due to this. Strength bilateral UE's 3/5 throughout .)   Lower Extremity Assessment Lower Extremity Assessment: Defer to PT evaluation (c/o left LE and hip pain)       Communication Communication Communication: No difficulties   Cognition Arousal/Alertness: Awake/alert Behavior During Therapy: WFL for tasks assessed/performed Overall Cognitive Status: Within Functional Limits for tasks assessed (No family present for baseline. So to respond to questions, decreased initiation)                     General Comments       Exercises       Shoulder Instructions      Home Living Family/patient expects to be discharged to:: Private residence Living Arrangements: Other relatives;Other (Comment) (Sister) Available Help at Discharge: Family;Available 24 hours/day Type of Home: House Home Access: Stairs to enter [employer] of Steps: 4 Entrance Stairs-Rails: None Home Layout: Two level;Bed/bath upstairs Alternate Level Stairs-Number of Steps: 16 Alternate Level Stairs-Rails: Right;Left;Can reach both Bathroom Shower/Tub: Chief Strategy Officer: Standard     Home Equipment: Environmental consultant - 2 wheels;Cane - single point;Shower seat   Additional [facility]      Prior Functioning/Environment Level of Independence: Needs assistance  Gait / Transfers Assistance Needed: uses straight cane for ambulation ADL's / Homemaking Assistance Needed: Pt states his sister assists PRN with bathing and dressing. She does cooking/meal prep and cleaning        OT Diagnosis: Generalized weakness;Acute pain   OT Problem  List: Decreased strength;Decreased activity tolerance;Impaired balance (sitting and/or standing);Decreased knowledge of precautions;Impaired UE functional use;Decreased knowledge of use of DME or AE;Increased edema;Decreased coordination;Cardiopulmonary status limiting activity;Pain   OT Treatment/Interventions: Self-care/ADL training;DME and/or AE instruction;Patient/family education;Therapeutic activities;Balance training    OT Goals(Current goals can be found in the care plan section) Acute Rehab OT Goals Patient Stated Goal: None stated Time For Goal Achievement: 08/06/15 Potential to Achieve Goals: Good ADL Goals Pt Will Perform Eating: with modified independence;sitting Pt Will Perform Grooming: with set-up;with supervision;with caregiver independent in assisting;sitting Pt Will Perform Upper Body Dressing: with set-up;with supervision;with caregiver independent in assisting;sitting Pt Will Perform Lower Body Dressing: with min assist;sit to/from stand;with caregiver independent in assisting Pt Will Transfer to Toilet: with min assist;ambulating;bedside commode;grab bars Pt Will Perform Toileting - Clothing Manipulation and hygiene: with min guard assist;sitting/lateral leans;sit to/from stand;with caregiver independent in assisting Additional ADL Goal #1: Pt will sit EOB x10 min or more in preparation w/o LOB for increased participation in ADL's and decreased burden of care  OT Frequency: Min 2X/week   Barriers to D/C: Other (comment)  History of Falls w/ Bedroom and  tub on second floor; 1/2 bath on first floor. Pt states that he cannot sleep on main level of house       Co-evaluation              End of Session Nurse Communication: Mobility status;Other (comment) (Nurse tech was educated in elevated BP in sitting and OT assisted pt back to bed.)  Activity Tolerance: Treatment limited secondary to medical complications (Comment) (BP 166/111 in sitting EOB, pulse 111 and O2 96%  on room air) Patient left: in bed;with call bell/phone within reach;with bed alarm set   Time: [id number] OT Time Calculation (min): 36 min Charges:  OT General Charges $OT Visit: 1 Procedure OT Evaluation $OT Eval Moderate Complexity: 1 Procedure OT Treatments $Self Care/Home Management : 8-22 mins G-Codes:    Roselie AwkwardBarnhill, Ceana Fiala Beth Dixon, OTR/L 07/23/2015, 12:00 PM

## 2015-07-24 DIAGNOSIS — M6282 Rhabdomyolysis: Principal | ICD-10-CM

## 2015-07-24 DIAGNOSIS — E785 Hyperlipidemia, unspecified: Secondary | ICD-10-CM

## 2015-07-24 LAB — CK: Total CK: 3443 U/L — ABNORMAL HIGH (ref 49–397)

## 2015-07-24 LAB — TROPONIN I

## 2015-07-24 LAB — GLUCOSE, CAPILLARY: Glucose-Capillary: 97 mg/dL (ref 65–99)

## 2015-07-24 MED ORDER — SODIUM CHLORIDE 0.9 % IV SOLN
INTRAVENOUS | Status: DC
  Administered 2015-07-24: 12:00:00 via INTRAVENOUS

## 2015-07-24 MED ORDER — ZOLPIDEM TARTRATE 5 MG PO TABS
5.0000 mg | ORAL_TABLET | Freq: Every evening | ORAL | Status: DC | PRN
  Filled 2015-07-24: qty 1

## 2015-07-24 NOTE — Care Management Important Message (Signed)
Important Message  Patient Details  Name: Norman Davis MRN: [id number] Date of Birth: 23-Mar-1941   Medicare Important Message Given:  Yes    Oralia RudMegan P Lanisha Stepanian 07/24/2015, 12:49 PM

## 2015-07-24 NOTE — Clinical Social Work Note (Signed)
Clinical Social Work Assessment  Patient Details  Name: Norman Davis MRN: [id number] Date of Birth: 1941-05-01  Date of referral:  07/24/15               Reason for consult:  Facility Placement                Permission sought to share information with:  Family Supports Permission granted to share information::  Yes, Verbal Permission Granted  Name::     Facilities managerLillie  Agency::  [facility]   Relationship::  sister  Contact Information:     Housing/Transportation Living arrangements for the past 2 months:  Single Family Home Source of Information:  Patient Patient Interpreter Needed:  None Criminal Activity/Legal Involvement Pertinent to Current Situation/Hospitalization:  No - Comment as needed Significant Relationships:  Siblings Lives with:  Siblings Do you feel safe going back to the place where you live?  No Need for family participation in patient care:  Yes (Comment)  Care giving concerns:  Pt lives at home with elderly sister who is unable to provide sufficient physical assist at this time.   Social Worker assessment / plan:  CSW spoke with pt at bedside concerning PT/MD recommendation for SNF.  Pt has been to SNF in the past and is familiar with process- no questions regarding placement at this time.  [facility] and would be agreeable to return.  Employment status:  Retired Health and safety inspectornsurance information:  [employer]are PT Recommendations:  Skilled Nursing Facility Information / Referral to community resources:  Skilled Nursing Facility  Patient/Family's Response to care:  Pt is agreeable to placement at [facility] to get short term rehab  Patient/Family's Understanding of and Emotional Response to Diagnosis, Current Treatment, and Prognosis:  Unclear- pt seemed somewhat flat during interview but acknowledged need for SNF stay but is hopeful for quick return home.  Emotional Assessment Appearance:  Appears stated age Attitude/Demeanor/Rapport:    Affect (typically  observed):  Appropriate, Quiet Orientation:  Oriented to Self, Oriented to Place, Oriented to  Time, Oriented to Situation Alcohol / Substance use:  Not Applicable Psych involvement (Current and /or in the community):  No (Comment)  Discharge Needs  Concerns to be addressed:  Discharge Planning Concerns, Care Coordination Readmission within the last 30 days:  No Current discharge risk:  Physical Impairment Barriers to Discharge:  Continued Medical Work up   Peabody EnergyHoloman, Zigmond Trela M, LCSW 07/24/2015, 10:17 AM

## 2015-07-24 NOTE — NC FL2 (Signed)
[place] MEDICAID FL2 LEVEL OF CARE SCREENING TOOL     IDENTIFICATION  Patient Name: Norman Davis Birthdate: 1940-12-24 Sex: male Admission Date (Current Location): 07/21/2015  [facility] and [place] Number:  Producer, television/film/video and Address:  The [place], [address], [place], [place] [address]      Provider Number: [id number]  Attending Physician Name and Address:  Leatha Gilding, MD  Relative Name and Phone Number:       Current Level of Care: Hospital Recommended Level of Care: Skilled Nursing Facility Prior Approval Number:    Date Approved/Denied:   PASRR Number: [id number]  Discharge Plan: SNF    Current Diagnoses: Patient Active Problem List   Diagnosis Date Noted  . Fall 07/22/2015  . HLD (hyperlipidemia) 07/22/2015  . Depression 07/22/2015  . Rhabdomyolysis 07/22/2015  . Hypokalemia 07/22/2015  . Elevated CK   . COPD exacerbation (HCC) 06/12/2015  . Elevated troponin 06/12/2015  . Elevated d-dimer   . Leg pain, left 04/28/2015  . Chest pain 04/28/2015  . NSTEMI (non-ST elevated myocardial infarction) (HCC) 04/28/2015  . Abdominal pain 04/28/2015  . COPD (chronic obstructive pulmonary disease) (HCC) 04/11/2013  . Hypercholesteremia   . Hypertension   . H/O urinary retention   . Myocardial infarction (HCC)   . Gallstones 03/17/2013  . Renal stone 03/17/2013  . SBO (small bowel obstruction) (HCC) 03/16/2013  . AKI (acute kidney injury) (HCC) 03/16/2013  . High anion gap metabolic acidosis 03/16/2013  . 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) 03/16/2013    Orientation RESPIRATION BLADDER Height & Weight     Self, Time, Situation, Place  Normal Incontinent Weight: 182 lb (82.555 kg) Height:   (175.3 cm)  BEHAVIORAL SYMPTOMS/MOOD NEUROLOGICAL BOWEL NUTRITION STATUS      Continent Diet (see DC summary)  AMBULATORY STATUS COMMUNICATION OF NEEDS Skin   Extensive Assist Verbally Normal                       Personal  Care Assistance Level of Assistance  Bathing, Dressing Bathing Assistance: Maximum assistance   Dressing Assistance: Maximum assistance     Functional Limitations Info             SPECIAL CARE FACTORS FREQUENCY  PT (By licensed PT), OT (By licensed OT)     PT Frequency: 5/wk OT Frequency: 5/wk            Contractures      Additional Factors Info  Code Status, Allergies, Psychotropic Code Status Info: FULL Allergies Info: Aspirin, Penicillins Psychotropic Info: prozac         Current Medications (07/24/2015):  This is the current hospital active medication list Current Facility-Administered Medications  Medication Dose Route Frequency Provider Last Rate Last Dose  . 0.9 %  sodium chloride infusion   Intravenous Continuous Costin Otelia Sergeant, MD      . acetaminophen (TYLENOL) tablet 650 mg  650 mg Oral Q6H PRN Lorretta Harp, MD   650 mg at 07/23/15 1604   Or  . acetaminophen (TYLENOL) suppository 650 mg  650 mg Rectal Q6H PRN Lorretta Harp, MD      . albuterol (PROVENTIL) (2.5 MG/3ML) 0.083% nebulizer solution 2.5 mg  2.5 mg Nebulization Q4H PRN Lorretta Harp, MD      . amLODipine (NORVASC) tablet 10 mg  10 mg Oral Daily Lorretta Harp, MD   10 mg at 07/24/15 0913  . clopidogrel (PLAVIX) tablet 75 mg  75 mg  Oral Q breakfast Lorretta HarpXilin Niu, MD   75 mg at 07/24/15 0912  . dextromethorphan-guaiFENesin (MUCINEX DM) 30-600 MG per 12 hr tablet 1 tablet  1 tablet Oral BID Lorretta HarpXilin Niu, MD   1 tablet at 07/24/15 0912  . doxazosin (CARDURA) tablet 8 mg  8 mg Oral QHS Lorretta HarpXilin Niu, MD   8 mg at 07/23/15 2131  . enoxaparin (LOVENOX) injection 40 mg  40 mg Subcutaneous Daily Lorretta HarpXilin Niu, MD   40 mg at 07/24/15 0912  . finasteride (PROSCAR) tablet 5 mg  5 mg Oral Daily Lorretta HarpXilin Niu, MD   5 mg at 07/24/15 0912  . FLUoxetine (PROZAC) capsule 40 mg  40 mg Oral Daily Lorretta HarpXilin Niu, MD   40 mg at 07/24/15 0912  . gi cocktail (Maalox,Lidocaine,Donnatal)  30 mL Oral TID PRN Leatha Gildingostin M Gherghe, MD   30 mL at 07/23/15 1619  .  hydrALAZINE (APRESOLINE) injection 5 mg  5 mg Intravenous Q2H PRN Lorretta HarpXilin Niu, MD   5 mg at 07/22/15 0404  . loratadine (CLARITIN) tablet 10 mg  10 mg Oral Daily Lorretta HarpXilin Niu, MD   10 mg at 07/24/15 0912  . mirabegron ER (MYRBETRIQ) tablet 25 mg  25 mg Oral Daily Lorretta HarpXilin Niu, MD   25 mg at 07/24/15 0911  . mirtazapine (REMERON) tablet 30 mg  30 mg Oral QHS Lorretta HarpXilin Niu, MD   30 mg at 07/23/15 2131  . morphine 2 MG/ML injection 1 mg  1 mg Intravenous Q4H PRN Lorretta HarpXilin Niu, MD      . oxyCODONE-acetaminophen (PERCOCET/ROXICET) 5-325 MG per tablet 1 tablet  1 tablet Oral Q4H PRN Lorretta HarpXilin Niu, MD   1 tablet at 07/23/15 2131  . pantoprazole (PROTONIX) EC tablet 40 mg  40 mg Oral Daily Lorretta HarpXilin Niu, MD   40 mg at 07/24/15 0912  . pravastatin (PRAVACHOL) tablet 40 mg  40 mg Oral QHS Lorretta HarpXilin Niu, MD   40 mg at 07/23/15 2131  . sodium chloride flush (NS) 0.9 % injection 3 mL  3 mL Intravenous Q12H Lorretta HarpXilin Niu, MD   3 mL at 07/24/15 0918  . tiotropium (SPIRIVA) inhalation capsule 18 mcg  18 mcg Inhalation Daily Lorretta HarpXilin Niu, MD   18 mcg at 07/23/15 0816  . zolpidem (AMBIEN) tablet 5 mg  5 mg Oral QHS PRN Leanne ChangKatherine P Schorr, NP         Discharge Medications: Please see discharge summary for a list of discharge medications.  Relevant Imaging Results:  Relevant Lab Results:   Additional Information SS#: [id number]  Izora RibasHoloman, Tatelyn Vanhecke M, [place]

## 2015-07-24 NOTE — Progress Notes (Signed)
PROGRESS NOTE  Norman Davis ZOX:[id number] DOB: [birth year] DOA: 07/21/2015 PCP: Pcp Not In System Outpatient Specialists:    LOS: 2 days   Brief Narrative: 75 y.o. male with PMH of hypertension, hyperlipidemia, COPD, GERD, depression, small bowel obstruction, Paget's bone disease, kidney stone, gallstone, schizophrenia on admission, BPH, who presents with pain over neck, back, left chest wall, abdomen and head after fall.  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 - He states that he has left leg weakness, but physical examination did not show left leg weakness compared to the right leg. No acute bony fractures on images. - MRI of brain to rule out TIA/stroke negative for acute findings however showed cerebral atrophy, chronic small vessel ischemic disease and scattered remote lacunar and cerebellar infarcts. Suspect this is the main cause for his balance issues - PT recommending SNF, patient agreeable, SW consulted  Rhabdomyolysis  - CK 5464. Creatinine 1.13 on admission - Hold Mobic - CK improving however still elevated to ~ 3000, continue IVF  HTN - Continue amlodipine. BP fair today, 147/94 - IV hydralazine when necessary  COPD (chronic obstructive pulmonary disease) - Continue spiriva inhaler - PRN albuterol nebs  Chest pain - called for chest pain and back pain later in the afternoon, resolved - EKG similar to prior tracings - troponin negative - CXR without acute process - cycle CEs overnight  HLD  - Last LDL was 70 on 04/29/15 - Continue home medications: Pravastatin  GERD - Protonix  BPH - continue home Cardura, Proscar,  Mirabegron  Depression - Continue home medications: Prozac, mirtazapine  Hypokalemia - K= 3.1 on admission. - Repleted, improved - Mg low, replete  Severe spinal stenosis - evaluated by Dr. Jeral Fruit in Dec 2016, outpatient follow up. Patient has not seen Dr Jeral Fruit, states "nobody gave me an appointment". Will schedule one, 3/30 at 9:30 am - may also contribute to his falls   DVT prophylaxis: Lovenox Code Status: Full Family Communication: no family bedside Disposition Plan: SNF 1 day Barriers for discharge: rhabdomyolysis, IVF  Consultants:   None   Procedures:   None   Antimicrobials:  None    Subjective: - no complaints this morning   Objective: Filed Vitals:   07/23/15 1426 07/23/15 2219 07/24/15 0612 07/24/15 0911  BP: 136/83 151/86 161/100 147/94  Pulse: 94 84 82   Temp: 98 F (36.7 C) 98.3 F (36.8 C) 97.8 F (36.6 C)   TempSrc: Oral Oral Oral   Resp: Height:      Weight:      SpO2: 97% 97% 97%     Intake/Output Summary (Last 24 hours) at 07/24/15 1206 Last data filed at 07/24/15 0900  Gross per 24 hour  Intake    150 ml  Output   2000 ml  Net  -1850 ml   [facility] Weights   07/21/15 2114 07/22/15 1438  Weight: 86.637 kg (191 lb) 82.555 kg (182 lb)    Examination: BP 147/94 mmHg  Pulse 82  Temp(Src) 97.8 F (36.6 C) (Oral)  Resp 18  Ht  (1.753 m)  Wt 82.555 kg (182 lb)  BMI 26.86 kg/m2  SpO2 97%  GENERAL: NAD  HEENT: head NCAT, no scleral icterus. Pupils round and reactive.  LUNGS: Clear to auscultation. No wheezing or crackles  HEART: Regular rate and rhythm without murmur. 2+ pulses, no JVD, no peripheral edema  ABDOMEN: Soft, nontender, and nondistended. Positive bowel sounds.   EXTREMITIES: Without any cyanosis or clubbing  NEUROLOGIC: Alert and oriented x3. Non focal  PSYCH: flat affect   Data Reviewed: I have personally reviewed following labs and imaging studies  CBC:  Recent Labs Lab  07/22/15 0011 07/22/15 0455 07/23/15 0532  WBC 7.3 6.7 6.9  NEUTROABS 4.9  --   --   HGB 11.4* 12.4* 13.0  HCT 35.7* 38.8* 41.4  MCV 86.2 86.8 87.2  PLT 177 183 142*   Basic Metabolic Panel:  Recent Labs Lab 07/22/15 0011 07/22/15 0455 07/23/15 0532  NA 147* 145 143  K 3.2* 3.4* 4.3  CL 112* 111 110  CO2 24 24 23   GLUCOSE 87 84 115*  BUN 11 9 6   CREATININE 1.13 1.04 0.93  CALCIUM 9.1 8.7* 8.7*  MG  --  1.5*  --    Cardiac Enzymes:  Recent Labs Lab 07/22/15 0011 07/22/15 1020 07/23/15 0532 07/23/15 1626 07/23/15 2214 07/24/15 0406  CKTOTAL 5464* 13077* 7065*  --   --  3443*  TROPONINI  --   --   --  <0.03 <0.03 <0.03   CBG:  Recent Labs Lab 07/22/15 1012 07/23/15 0810 07/24/15 0753  GLUCAP 111* 101* 97   Urine analysis:    Component Value Date/Time   COLORURINE YELLOW 07/22/2015 0253   APPEARANCEUR CLEAR 07/22/2015 0253   LABSPEC 1.033* 07/22/2015 0253   PHURINE 6.5 07/22/2015 0253   GLUCOSEU NEGATIVE 07/22/2015 0253   HGBUR LARGE* 07/22/2015 0253   BILIRUBINUR NEGATIVE 07/22/2015 0253   KETONESUR 15* 07/22/2015 0253   PROTEINUR NEGATIVE 07/22/2015 0253   UROBILINOGEN 1.0 03/16/2013 1602   NITRITE NEGATIVE 07/22/2015 0253   LEUKOCYTESUR NEGATIVE 07/22/2015 0253   Radiology Studies: Dg Chest Port 1 View  07/23/2015  CLINICAL DATA:  Left chest pain and shortness of breath EXAM: PORTABLE CHEST 1 VIEW COMPARISON:  07/21/2005, 04/29/2015 FINDINGS: 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 No superimposed pneumonia, collapse or consolidation. Negative for edema, significant effusion or pneumothorax. Mild cardiac enlargement with central vascular congestion. Trachea is midline. Aorta is atherosclerotic and elongated. IMPRESSION: Background bullous emphysema.  No superimposed acute process Electronically Signed   By: Judie PetitM.  Shick M.D.   On: 07/23/2015 16:41   Scheduled Meds: . amLODipine  10 mg Oral Daily  . clopidogrel  75  mg Oral Q breakfast  . dextromethorphan-guaiFENesin  1 tablet Oral BID  . doxazosin  8 mg Oral QHS  . enoxaparin (LOVENOX) injection  40 mg Subcutaneous Daily  . finasteride  5 mg Oral Daily  . FLUoxetine  40 mg Oral Daily  . loratadine  10 mg Oral Daily  . mirabegron ER  25 mg Oral Daily  . mirtazapine  30 mg Oral QHS  . pantoprazole  40 mg Oral Daily  . pravastatin  40 mg Oral QHS  . sodium chloride flush  3 mL Intravenous Q12H  . tiotropium  18 mcg Inhalation Daily   Continuous Infusions: . sodium chloride      Pamella Pertostin Gherghe, MD, PhD [facility] Pager [phone]  If 7PM-7AM, please contact night-coverage [email or web address] Password [email or web address] 07/24/2015, 12:06 PM

## 2015-07-25 DIAGNOSIS — E876 Hypokalemia: Secondary | ICD-10-CM

## 2015-07-25 DIAGNOSIS — J439 Emphysema, unspecified: Secondary | ICD-10-CM

## 2015-07-25 LAB — CK: Total CK: 1551 U/L — ABNORMAL HIGH (ref 49–397)

## 2015-07-25 LAB — GLUCOSE, CAPILLARY: GLUCOSE-CAPILLARY: 102 mg/dL — AB (ref 65–99)

## 2015-07-25 MED ORDER — OXYCODONE-ACETAMINOPHEN 5-325 MG PO TABS: 1.0000 | ORAL_TABLET | ORAL | Status: DC | PRN

## 2015-07-25 NOTE — Clinical Social Work Note (Signed)
Clinical Social Worker facilitated patient discharge including contacting patient family and facility to confirm patient discharge plans.  Clinical information faxed to facility and family agreeable with plan.  CSW [facility]  RN to call report prior to discharge.  Clinical Social Worker will sign off for now as social work intervention is no longer needed. Please consult us again if new need arises.  Derenda FennelBashira Dominiqua Cooner, MSW, LCSWA [phone] 07/25/2015 9:55 AM

## 2015-07-25 NOTE — Progress Notes (Signed)
Norman Davis to be D/C'd to skilled nursing facility per MD order.  Discussed with the patient and all questions fully answered.  VSS, Skin clean, dry and intact without evidence of skin break down, no evidence of skin tears noted. IV catheter discontinued intact. Site without signs and symptoms of complications. Dressing and pressure applied.  An After Visit Summary was printed and given to the PTAR. PTAR received prescription.  Patient instructed to return to ED, call 911, or call MD for any changes in condition.   Patient escorted via stretcher, and D/C home via PTAR.  Theressa StampsKayla L Price 07/25/2015 12:34 PM

## 2015-07-25 NOTE — Discharge Summary (Signed)
Physician Discharge Summary  Norman Davis OZH:[id number] DOB: July 08, 1940 DOA: 07/21/2015  PCP: Pcp Not In System  Admit date: 07/21/2015 Discharge date: 07/25/2015  Time spent: > 30 minutes  Recommendations for Outpatient Follow-up:  1. Follow up with Dr. Jeral Fruit in 5 days as scheduled   Discharge Diagnoses:  Principal Problem:   Fall Active Problems:   Schizophrenia in remission [facility])   Hypertension   COPD (chronic obstructive pulmonary disease) (HCC)   HLD (hyperlipidemia)   Depression   Rhabdomyolysis   Hypokalemia  Discharge Condition: stable  Diet recommendation: regular  Filed Weights   07/21/15 2114 07/22/15 1438  Weight: 86.637 kg (191 lb) 82.555 kg (182 lb)    History of present illness:  See H&P, Labs, Consult and Test reports for all details in brief, patient is a 75 y.o. male with PMH of hypertension, hyperlipidemia, COPD, GERD, depression, small bowel obstruction, Paget's bone disease, kidney stone, gallstone, schizophrenia on admission, BPH, who presents with pain over neck, back, left chest wall, abdomen and head after fall.  Hospital Course:  Fall - Patient seems to have mechanic fall the night prior to admission, but he has been having frequent fall recently. He states that he has left leg weakness, but physical examination did not show left leg weakness compared to the right leg. No acute bony fractures on images. MRI of brain to rule out TIA/stroke negative for acute findings however showed cerebral atrophy, chronic small vessel ischemic disease and scattered remote lacunar and cerebellar infarcts. Suspect this is the main cause for his balance issues. PT recommending SNF, patient agreeable Rhabdomyolysis / AKI- CK 5464. Creatinine 1.13 on admission. Improved with IVF. Renal function normalized. HTN - Continue home regimen COPD (chronic obstructive pulmonary disease) - no exacerbation. Continue home medications Chest pain - one episode, lasting few minutes.  EKG non ischemic, troponin negative x 3. CXR normal. Resolved.  HLD  Last LDL was 70 on 04/29/15 - Continue home medications: Pravastatin BPH - continue home Cardura, Proscar, Mirabegron Depression - Continue home medications: Prozac, mirtazapine Hypokalemia - K= 3.1 on admission, Repleted, improved Severe spinal stenosis - evaluated by Dr. Jeral Fruit in Dec 2016, outpatient follow up. Patient has not seen Dr Jeral Fruit, states "nobody gave me an appointment". Have scheduled an appointment on 3/30 at 9:30 am, discussed with patient.   Procedures:  None    Consultations:  None   Discharge Exam: Filed Vitals:   07/24/15 2104 07/25/15 0548 07/25/15 0755 07/25/15 0849  BP: 102/62 114/71 110/81   Pulse: 94 90    Temp: 98.9 F (37.2 C) 98.6 F (37 C)    TempSrc: Oral Oral    Resp: 20 20    Height:      Weight:      SpO2: 95% 94%  95%    General: NAD Cardiovascular: RRR Respiratory: CTA biL  Discharge Instructions Activity:  As tolerated   Get Medicines reviewed and adjusted: Please take all your medications with you for your next visit with your Primary MD  Please request your Primary MD to go over all hospital tests and procedure/radiological results at the follow up, please ask your Primary MD to get all Hospital records sent to his/her office.  If you experience worsening of your admission symptoms, develop shortness of breath, life threatening emergency, suicidal or homicidal thoughts you must seek medical attention immediately by calling 911 or calling your MD immediately if symptoms less severe.  You must read complete instructions/literature along with all the possible  adverse reactions/side effects for all the Medicines you take and that have been prescribed to you. Take any new Medicines after you have completely understood and accpet all the possible adverse reactions/side effects.   Do not drive when taking Pain medications.   Do not take more than prescribed Pain,  Sleep and Anxiety Medications  Special Instructions: If you have smoked or chewed Tobacco in the last 2 yrs please stop smoking, stop any regular Alcohol and or any Recreational drug use.  Wear Seat belts while driving.  Please note  You were cared for by a hospitalist during your hospital stay. Once you are discharged, your primary care physician will handle any further medical issues. Please note that NO REFILLS for any discharge medications will be authorized once you are discharged, as it is imperative that you return to your primary care physician (or establish a relationship with a primary care physician if you do not have one) for your aftercare needs so that they can reassess your need for medications and monitor your lab values.    Medication List    TAKE these medications        acetaminophen 500 MG tablet  Commonly known as:  TYLENOL  Take 1,000 mg by mouth every 6 (six) hours as needed for mild pain.     albuterol 108 (90 Base) MCG/ACT inhaler  Commonly known as:  PROVENTIL HFA;VENTOLIN HFA  Inhale 2 puffs into the lungs every 4 (four) hours as needed for wheezing or shortness of breath.     amLODipine 10 MG tablet  Commonly known as:  NORVASC  Take 10 mg by mouth daily.     cetirizine 10 MG tablet  Commonly known as:  ZYRTEC  Take 10 mg by mouth at bedtime.     clopidogrel 75 MG tablet  Commonly known as:  PLAVIX  Take 75 mg by mouth daily with breakfast.     dextromethorphan-guaiFENesin 30-600 MG 12hr tablet  Commonly known as:  MUCINEX DM  Take 1 tablet by mouth 2 (two) times daily.     doxazosin 8 MG tablet  Commonly known as:  CARDURA  Take 8 mg by mouth at bedtime.     finasteride 5 MG tablet  Commonly known as:  PROSCAR  Take 5 mg by mouth daily.     FLUoxetine 20 MG capsule  Commonly known as:  PROZAC  Take 40 mg by mouth daily.     meloxicam 7.5 MG tablet  Commonly known as:  MOBIC  Take 7.5 mg by mouth daily.     mirtazapine 30 MG tablet    Commonly known as:  REMERON  Take 30 mg by mouth at bedtime.     MYRBETRIQ PO  Take 25 mg by mouth daily.     oxyCODONE-acetaminophen 5-325 MG tablet  Commonly known as:  PERCOCET/ROXICET  Take 1 tablet by mouth every 4 (four) hours as needed for moderate pain.     pantoprazole 40 MG tablet  Commonly known as:  PROTONIX  Take 40 mg by mouth daily.     pravastatin 40 MG tablet  Commonly known as:  PRAVACHOL  Take 40 mg by mouth at bedtime.     senna 8.6 MG Tabs tablet  Commonly known as:  SENOKOT  Take 1 tablet by mouth 2 (two) times daily.     tiotropium 18 MCG inhalation capsule  Commonly known as:  SPIRIVA  Place 18 mcg into inhaler and inhale daily.  Follow-up Information    Follow up with Karn Cassis, MD On 07/30/2015.   Specialty:  Neurosurgery   Why:  07/30/15 @ 9:30am. PLEASE REMEMBER TO BRING CURRENT INSURANCE INFORMATION AND A LIST OF CURRENT MEDICATIONS THAT YOU ARE TAKING.   Contact information:   [address] [place] [address] [phone]       The results of significant diagnostics from this hospitalization (including imaging, microbiology, ancillary and laboratory) are listed below for reference.    Significant Diagnostic Studies: Dg Chest 2 View  07/22/2015  CLINICAL DATA:  75 year old male with fall and left hip pain. Anterior chest wall pain. EXAM: CHEST  2 VIEW COMPARISON:  Radiograph dated 06/12/2015 FINDINGS: Two views of the chest demonstrate emphysematous changes of the lungs. There is no focal consolidation, pleural effusion, or pneumothorax. Stable cardiac silhouette. There is degenerative changes of the spine. No acute fracture. IMPRESSION: No acute intrathoracic pathology. Emphysema. Electronically Signed   By: Elgie Collard M.D.   On: 07/22/2015 00:32   Dg Lumbar Spine Complete  07/21/2015  CLINICAL DATA:  Fall today.  Low back pain.  Prior lumbar surgery. EXAM: LUMBAR SPINE - COMPLETE 4+ VIEW  COMPARISON:  CT abdomen and pelvis 04/29/2015. Lumbar spine MRI 04/28/2015. FINDINGS: Sequelae of L5-S1 posterior fusion are again identified. Advanced L4-5 disc degeneration is again seen with severe disc space height loss and degenerative endplate spurring. Vertebral body heights are preserved without evidence of compression fracture. Slight anterolisthesis of L4 on L5 is grossly unchanged. No pars defects are identified. Extensive prior internal fixation is noted involving the left iliac bone with screws traversing the left SI joint. 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 An IVC filter is noted at the L2 level. IMPRESSION: 1. Prior L5-S1 posterior fusion without radiographic evidence of are were complication. 2. 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 Electronically Signed   By: Sebastian Ache M.D.   On: 07/21/2015 23:32   Ct Head Wo Contrast  07/21/2015  CLINICAL DATA:  Fall today. No loss of consciousness. History of hypertension. EXAM: CT HEAD WITHOUT CONTRAST CT CERVICAL SPINE WITHOUT CONTRAST TECHNIQUE: Multidetector CT imaging of the head and cervical spine was performed following the standard protocol without intravenous contrast. Multiplanar CT image reconstructions of the cervical spine were also generated. COMPARISON:  None. FINDINGS: CT HEAD FINDINGS Diffuse cerebral atrophy. Ventricular dilatation consistent with central atrophy. Low-attenuation changes in the deep white matter are nonspecific but consistent with small vessel ischemia. No mass effect or midline shift. No abnormal extra-axial fluid collections. Gray-white matter junctions are distinct. Basal cisterns are not effaced. No evidence of acute intracranial hemorrhage. No depressed skull fractures. Mild mucosal thickening in the paranasal sinuses. Mastoid air cells are not opacified. Vascular calcifications. CT CERVICAL SPINE FINDINGS Straightening of the usual  cervical lordosis. This may be due to patient positioning or degenerative changes but ligamentous injury or muscle spasm could also have this appearance and are not excluded. No anterior subluxation. Normal alignment of the facet joints. Degenerative changes throughout the cervical spine with narrowed interspaces and associated endplate hypertrophic changes. No vertebral compression deformities. No prevertebral soft tissue swelling. C1-2 articulation appears intact. No focal bone lesion or bone destruction. Soft tissues are unremarkable. Severe bullous emphysematous changes demonstrated in the lung apices. IMPRESSION: No acute intracranial abnormalities. Chronic atrophy and small vessel ischemic changes. Nonspecific straightening of the usual cervical lordosis. Diffuse degenerative changes. No acute displaced fractures identified in  the cervical spine. Electronically Signed   By: Burman Nieves M.D.   On: 07/21/2015 23:44   Ct Cervical Spine Wo Contrast  07/21/2015  CLINICAL DATA:  Fall today. No loss of consciousness. History of hypertension. EXAM: CT HEAD WITHOUT CONTRAST CT CERVICAL SPINE WITHOUT CONTRAST TECHNIQUE: Multidetector CT imaging of the head and cervical spine was performed following the standard protocol without intravenous contrast. Multiplanar CT image reconstructions of the cervical spine were also generated. COMPARISON:  None. FINDINGS: CT HEAD FINDINGS Diffuse cerebral atrophy. Ventricular dilatation consistent with central atrophy. Low-attenuation changes in the deep white matter are nonspecific but consistent with small vessel ischemia. No mass effect or midline shift. No abnormal extra-axial fluid collections. Gray-white matter junctions are distinct. Basal cisterns are not effaced. No evidence of acute intracranial hemorrhage. No depressed skull fractures. Mild mucosal thickening in the paranasal sinuses. Mastoid air cells are not opacified. Vascular calcifications. CT CERVICAL SPINE  FINDINGS Straightening of the usual cervical lordosis. This may be due to patient positioning or degenerative changes but ligamentous injury or muscle spasm could also have this appearance and are not excluded. No anterior subluxation. Normal alignment of the facet joints. Degenerative changes throughout the cervical spine with narrowed interspaces and associated endplate hypertrophic changes. No vertebral compression deformities. No prevertebral soft tissue swelling. C1-2 articulation appears intact. No focal bone lesion or bone destruction. Soft tissues are unremarkable. Severe bullous emphysematous changes demonstrated in the lung apices. IMPRESSION: No acute intracranial abnormalities. Chronic atrophy and small vessel ischemic changes. Nonspecific straightening of the usual cervical lordosis. Diffuse degenerative changes. No acute displaced fractures identified in the cervical spine. Electronically Signed   By: Burman Nieves M.D.   On: 07/21/2015 23:44   Mr Brain Wo Contrast  07/22/2015  CLINICAL DATA:  Initial valuation for frequent falls. Evaluate for possible stroke. EXAM: MRI HEAD WITHOUT CONTRAST TECHNIQUE: Multiplanar, multiecho pulse sequences of the brain and surrounding structures were obtained without intravenous contrast. COMPARISON:  Prior CT from 07/21/2015. FINDINGS: Diffuse prominence of the CSF containing spaces compatible with moderate generalized cerebral atrophy. Patchy T2/FLAIR hyperintensity within the periventricular and deep white matter both cerebral hemispheres most consistent with chronic small vessel ischemic disease. Prominent chronic small vessel ischemic disease present within the pons. Scattered remote lacunar infarcts within the thalami and basal ganglia bilaterally. Additional remote lacunar infarct within the right corona radiata. Probable scatter remote bilateral cerebellar infarcts also noted. No abnormal foci of restricted diffusion to suggest acute intracranial  infarct. Gray-white matter differentiation maintained. Major intracranial vascular flow voids preserved. Mild vertebrobasilar dolichoectasia noted. No acute intracranial hemorrhage. Single chronic probable punctate micro hemorrhage noted within the left upper lobe. No mass lesion, midline shift, or mass effect. No hydrocephalus. No extra-axial fluid collection. Major dural sinuses are grossly patent. Craniocervical junction within normal limits. Scattered degenerative spondylolysis noted within the visualized upper cervical spine without significant stenosis. Pituitary gland normal. No acute abnormality about the orbits. Sequela prior lens extraction noted on the left. Scattered mucosal thickening within the paranasal sinuses. No air-fluid levels to suggest active sinus infection. Small right mastoid effusion. Inner ear structure grossly normal. Bone marrow signal intensity within normal limits. No scalp soft tissue abnormality. IMPRESSION: 1. No acute intracranial infarct or other abnormality identified. 2. Moderate cerebral atrophy with chronic small vessel ischemic disease and scattered remote lacunar and cerebellar infarcts as above. Electronically Signed   By: Rise Mu M.D.   On: 07/22/2015 06:37   Ct Abdomen Pelvis W Contrast  07/22/2015  CLINICAL DATA:  75 year old male with fall and anterior chest pain abdominal pain. Left hip pain. EXAM: CT ABDOMEN AND PELVIS WITH CONTRAST TECHNIQUE: Multidetector CT imaging of the abdomen and pelvis was performed using the standard protocol following bolus administration of intravenous contrast. CONTRAST:  OMNIPAQUE IOHEXOL 300 MG/ML  SOLN COMPARISON:  Lumbar spine Radiograph dated 07/21/2015 and CT dated 04/29/2015 FINDINGS: There is emphysematous changes of the lung bases. No intra-abdominal free air or free fluid. There multiple stones within the gallbladder. No pericholecystic fluid or evidence of inflammatory changes of the gallbladder by CT.  Ultrasound may provide better evaluation of the gallbladder if clinically indicated. The liver, pancreas, spleen, adrenal glands appear unremarkable. The kidneys, visualized ureters appear unremarkable. The urinary bladder is predominantly collapsed. There is apparent diffuse thickening of the bladder wall which may be partly related to underdistention. Cystitis is not excluded. Correlation with urinalysis recommended. Evaluation of the bowel is limited in the absence of oral contrast. There is moderate stool throughout the colon. No evidence of bowel obstruction or inflammation. There is segmental area of thickening in the distal aspect of the ascending colon which case related to underdistention. Underlying lesion is not excluded. Colonoscopy may provide better evaluation. The appendix is not visualized with certainty. No inflammatory changes identified in the right lower quadrant. The abdominal aorta appears unremarkable. An infrarenal IVC filter is noted. No portal venous gas identified. There is no adenopathy. The abdominal wall soft tissues appear unremarkable. There is chronic sclerotic changes of the pelvic bone with trabecular thickening, possibly related to underlying Paget's disease. L5-S1 posterior fixation hardware. Multiple fixation screws noted in the left iliac bone. A stable appearing lucency in the inferior aspect of the L3 vertebra likely represent a Schmorl's node. No acute fracture identified. IMPRESSION: No acute/traumatic intra-abdominal or pelvic pathology. 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 Stable postsurgical changes of the left iliac bone with multiple fixation screws. No acute2 fracture. Cholelithiasis. Segmental thickening of the distal ascending colon possibly related to underdistention. Underlying lesion is not excluded. Electronically Signed   By: Elgie Collard M.D.   On: 07/22/2015 02:33   Dg Chest Port 1  View  07/23/2015  CLINICAL DATA:  Left chest pain and shortness of breath EXAM: PORTABLE CHEST 1 VIEW COMPARISON:  07/21/2005, 04/29/2015 FINDINGS: 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 No superimposed pneumonia, collapse or consolidation. Negative for edema, significant effusion or pneumothorax. Mild cardiac enlargement with central vascular congestion. Trachea is midline. Aorta is atherosclerotic and elongated. IMPRESSION: Background bullous emphysema.  No superimposed acute process Electronically Signed   By: Judie Petit.  Shick M.D.   On: 07/23/2015 16:41   Dg Hip Unilat With Pelvis 2-3 Views Left  07/22/2015  CLINICAL DATA:  Fall. EXAM: DG HIP (WITH OR WITHOUT PELVIS) 2-3V LEFT COMPARISON:  None. FINDINGS: Diffuse bone demineralization. Trabecular coarsening in the pelvis and right hip may indicate Paget's disease. Degenerative changes in the hips. No evidence of acute fracture or dislocation of the pelvis or left hip. Bone cortex appears intact. SI joints and symphysis pubis are nondisplaced. Postoperative changes with posterior fixation of the lower lumbar spine and multiple screws and plates fixing the left iliac bone. Inferior vena caval filter. IMPRESSION: No acute bony abnormalities. Trabecular coarsening suggest possible Paget's disease. Degenerative changes in the hips. Postoperative changes in the lower lumbar spine and pelvis. Electronically Signed   By: Burman Nieves M.D.   On:  07/22/2015 00:31    Labs: Basic Metabolic Panel:  Recent Labs Lab 07/22/15 0011 07/22/15 0455 07/23/15 0532  NA 147* 145 143  K 3.2* 3.4* 4.3  CL 112* 111 110  CO2 24 24 23   GLUCOSE 87 84 115*  BUN 11 9 6   CREATININE 1.13 1.04 0.93  CALCIUM 9.1 8.7* 8.7*  MG  --  1.5*  --    CBC:  Recent Labs Lab 07/22/15 0011 07/22/15 0455 07/23/15 0532  WBC 7.3 6.7 6.9  NEUTROABS 4.9  --   --   HGB 11.4* 12.4* 13.0  HCT 35.7* 38.8* 41.4  MCV 86.2 86.8 87.2  PLT 177 183 142*    Cardiac Enzymes:  Recent Labs Lab 07/22/15 0011 07/22/15 1020 07/23/15 0532 07/23/15 1626 07/23/15 2214 07/24/15 0406 07/25/15 0518  CKTOTAL 5464* 13077* 7065*  --   --  3443* 1551*  TROPONINI  --   --   --  <0.03 <0.03 <0.03  --    BNP: BNP (last 3 results)  Recent Labs  04/28/15 1838  BNP 23.1   CBG:  Recent Labs Lab 07/22/15 1012 07/23/15 0810 07/24/15 0753 07/25/15 0738  GLUCAP 111* 101* 97 102*    Signed:  GHERGHE, COSTIN  [facility] 07/25/2015, 9:46 AM

## 2015-07-28 ENCOUNTER — Non-Acute Institutional Stay (SKILLED_NURSING_FACILITY): Payer: Medicare Other | Admitting: Adult Health

## 2015-07-28 ENCOUNTER — Encounter: Payer: Self-pay | Admitting: Adult Health

## 2015-07-28 DIAGNOSIS — N400 Enlarged prostate without lower urinary tract symptoms: Secondary | ICD-10-CM | POA: Diagnosis not present

## 2015-07-28 DIAGNOSIS — E7800 Pure hypercholesterolemia, unspecified: Secondary | ICD-10-CM

## 2015-07-28 DIAGNOSIS — F32A Depression, unspecified: Secondary | ICD-10-CM

## 2015-07-28 DIAGNOSIS — I10 Essential (primary) hypertension: Secondary | ICD-10-CM

## 2015-07-28 DIAGNOSIS — J439 Emphysema, unspecified: Secondary | ICD-10-CM | POA: Diagnosis not present

## 2015-07-28 DIAGNOSIS — R32 Unspecified urinary incontinence: Secondary | ICD-10-CM | POA: Diagnosis not present

## 2015-07-28 DIAGNOSIS — F209 Schizophrenia, unspecified: Secondary | ICD-10-CM

## 2015-07-28 DIAGNOSIS — K219 Gastro-esophageal reflux disease without esophagitis: Secondary | ICD-10-CM

## 2015-07-28 DIAGNOSIS — M6282 Rhabdomyolysis: Secondary | ICD-10-CM | POA: Diagnosis not present

## 2015-07-28 DIAGNOSIS — I214 Non-ST elevation (NSTEMI) myocardial infarction: Secondary | ICD-10-CM

## 2015-07-28 DIAGNOSIS — F329 Major depressive disorder, single episode, unspecified: Secondary | ICD-10-CM | POA: Diagnosis not present

## 2015-07-28 NOTE — Progress Notes (Signed)
Patient ID: Norman Davis, male   DOB: February 22, 1941, 75 y.o.   MRN: [id number]   Facility: [facility]       Allergies  Allergen Reactions  . Aspirin     unknown  . Penicillins     Unknown Has patient had a PCN reaction causing immediate rash, facial/tongue/throat swelling, SOB or lightheadedness with hypotension: NO Has patient had a PCN reaction causing severe rash involving mucus membranes or skin necrosis: NO Has patient had a PCN reaction that required hospitalization NO Has patient had a PCN reaction occurring within the last 10 years: NO If all of the above answers are "NO", then may proceed with Cephalosporin use.    Chief Complaint  Patient presents with  . Hospitalization Follow-up    Hospital Follow up    HPI:  He has been hospitalized after a fall. He was having lower extremity weakness and balance issues. He was treated for acute kidney injury and rhabdomyolysis. He is here for short term rehab. He is complaining of tingling in his bilateral lower extremities. There are no nursing concerns at this time.    Past Medical History  Diagnosis Date  . Schizophrenia (HCC)   . Hypercholesteremia   . Hypertension   . H/O urinary retention   . GERD (gastroesophageal reflux disease)   . Small bowel obstruction due to adhesions (HCC)   . 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 Nephrolithiasis     right  . Cholelithiasis   . Shortness of breath dyspnea   . Fall 07/22/2015    Past Surgical History  Procedure Laterality Date  . Total hip arthroplasty    . Pelvic fracture surgery    . Liver surgery     Family History  Problem Relation Age of Onset  . Cancer Mother   . Cancer Father    Social History   Social History  . Marital Status: Divorced    Spouse Name: N/A  . Number of Children: N/A  . Years of Education: N/A   Occupational History  . Not on file.   Social History Main Topics  . Smoking status: Former Smoker -- 1.00 packs/day   Types: Cigarettes    Quit date: 05/02/2000  . Smokeless tobacco: Never Used  . Alcohol Use: No  . Drug Use: No  . Sexual Activity: No   Other Topics Concern  . Not on file   Social History Narrative   Immunization History  Administered Date(s) Administered  . Influenza,inj,Quad PF,36+ Mos 03/18/2013  . Influenza-Unspecified 01/01/2015  . PPD Test 05/11/2015, 07/25/2015  . Pneumococcal Polysaccharide-23 03/18/2013      VITAL SIGNS BP 131/68 mmHg  Pulse 97  Temp(Src) 96.3 F (35.7 C) (Oral)  Resp 20  Ht 5\' 9"  (1.753 m)  Wt 193 lb (87.544 kg)  BMI 28.49 kg/m2  Patient's Medications  New Prescriptions   No medications on file  Previous Medications   ACETAMINOPHEN (TYLENOL) 500 MG TABLET    Take 1,000 mg by mouth every 6 (six) hours as needed for mild pain.   ALBUTEROL (PROVENTIL HFA;VENTOLIN HFA) 108 (90 BASE) MCG/ACT INHALER    Inhale 2 puffs into the lungs every 4 (four) hours as needed for wheezing or shortness of breath.   AMLODIPINE (NORVASC) 10 MG TABLET    Take 10 mg by mouth daily.   CETIRIZINE (ZYRTEC) 10 MG TABLET    Take 10 mg by mouth at bedtime.    CLOPIDOGREL (PLAVIX) 75  MG TABLET    Take 75 mg by mouth daily with breakfast.   DEXTROMETHORPHAN-GUAIFENESIN (MUCINEX DM) 30-600 MG 12HR TABLET    Take 1 tablet by mouth 2 (two) times daily.   DOXAZOSIN (CARDURA) 8 MG TABLET    Take 8 mg by mouth at bedtime.   FINASTERIDE (PROSCAR) 5 MG TABLET    Take 5 mg by mouth daily.   FLUOXETINE (PROZAC) 20 MG CAPSULE    Take 40 mg by mouth daily.    MELOXICAM (MOBIC) 7.5 MG TABLET    Take 7.5 mg by mouth daily.   MIRABEGRON (MYRBETRIQ PO)    Take 25 mg by mouth daily.    MIRTAZAPINE (REMERON) 30 MG TABLET    Take 30 mg by mouth at bedtime.   OXYCODONE-ACETAMINOPHEN (PERCOCET/ROXICET) 5-325 MG TABLET    Take 1 tablet by mouth every 4 (four) hours as needed for moderate pain.   PANTOPRAZOLE (PROTONIX) 40 MG TABLET    Take 40 mg by mouth daily.   PRAVASTATIN (PRAVACHOL) 40 MG  TABLET    Take 40 mg by mouth at bedtime.    SENNA (SENOKOT) 8.6 MG TABS TABLET    Take 1 tablet by mouth 2 (two) times daily.    TIOTROPIUM (SPIRIVA) 18 MCG INHALATION CAPSULE    Place 18 mcg into inhaler and inhale daily.  Modified Medications   No medications on file  Discontinued Medications   No medications on file     SIGNIFICANT DIAGNOSTIC EXAMS  04-29-15: 2-d echo: - Left ventricle: Abnormal septal motion The cavity size was normal. Wall thickness was increased in a pattern of mild LVH. Systolic function was normal. The estimated ejection fraction was in the range of 50% to 55%. - Mitral valve: Calcified annulus. Mildly thickened leaflets . - Atrial septum: No defect or patent foramen ovale was identified  07-21-15: lumbar spine x-ray: 1. Prior L5-S1 posterior fusion without radiographic evidence of are were complication. 2. 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   07-21-15: ct of head and cervical spine: No acute intracranial abnormalities. Chronic atrophy and small vessel ischemic changes.  Nonspecific straightening of the usual cervical lordosis. Diffusedegenerative changes. No acute displaced fractures identified in the cervical spine.  07-22-15: bilateral hip and pelvic x-ray: No acute bony abnormalities. Trabecular coarsening suggest possible Paget's disease. Degenerative changes in the hips. Postoperative changes in the lower lumbar spine and pelvis.  07-22-15: chest x-ray: No acute intrathoracic pathology. Emphysema.  07-22-15; ct of abdomen and pelvis: No acute/traumatic intra-abdominal or pelvic pathology.Stable appearing sclerotic changes of the pelvic bones and right femur compatible with pagetoid changes. Stable postsurgical changes of the left iliac bone with multiple fixation screws. No acute2 fracture. Cholelithiasis.  Segmental thickening of the distal ascending colon possibly related to underdistention. Underlying lesion is not excluded.   07-22-15: mri of  brain: 1. No acute intracranial infarct or other abnormality identified. 2. Moderate cerebral atrophy with chronic small vessel ischemic disease and scattered remote lacunar and cerebellar infarcts as above.   07-23-15: chest x-ray: Background bullous emphysema.  No superimposed acute process    LABS REVIEWED:   07-22-15: wbc 7.3; hgb 11.4; hct 35.7; mcv 86.2; plt 177; glucose 87; bun 11; creat 1.13; k+ 3.2 ;na++ 147; CK 13077 07-23-15 glucose 115; bun 6; creat 0.93; k+ 4.3; na++143; CK 7065 07-24-15: CK 3443 07-25-15: CK 1551   Review of Systems  Constitutional: Positive for malaise/fatigue.  Respiratory: Negative for cough and shortness of breath.   Cardiovascular: Negative for chest  pain, palpitations and leg swelling.  Gastrointestinal: Negative for heartburn, abdominal pain and constipation.  Musculoskeletal: Negative for myalgias, back pain and joint pain.       Has lower extremity weakness   Skin: Negative.   Neurological: Positive for tingling. Negative for dizziness.       Has tingling pain in lower extremities   Psychiatric/Behavioral: The patient is not nervous/anxious.      Physical Exam  Constitutional: He appears well-developed and well-nourished. No distress.  Overweight   Eyes: Conjunctivae are normal.  Neck: Neck supple. No JVD present. No thyromegaly present.  Cardiovascular: Normal rate, regular rhythm and intact distal pulses.   Respiratory: Effort normal and breath sounds normal. No respiratory distress. He has no wheezes.  GI: Soft. Bowel sounds are normal. He exhibits no distension. There is no tenderness.  Musculoskeletal: He exhibits edema.  Able to move all extremities  Has 2+ lower extremity edema   Lymphadenopathy:    He has no cervical adenopathy.  Neurological: He is alert.  Skin: Skin is warm and dry. He is not diaphoretic.  Psychiatric: He has a normal mood and affect.       ASSESSMENT/ PLAN:  1. COPD: will continue spiriva daily; has  albuterol inhaler 2 puffs every 4 hours as needed; takes zyrtec 10 mg nightly take mucinex dm twice daily   2. Hypertension: will continue norvasc 10 mg daily cardura 8 mg nightly   3. Gerd: will continue protonix 40 mg daily   4. Dyslipidemia: will continue pravachol 40 mg daily   5. BPH: will continue proscar 5 mg daily cardura 8 mg nightly   6. UI: will continue myrbetriq 25 mg daily   7. Constipation: will continue senna twice daily   8. Status post NSTEMI: will continue plavix 75 mg daily   9. Severe spinal stenosis: will continue mobic 7.5 mg daily and has percocet 5/325 mg every 4 hours as needed for pain  Will have patient see Dr. Jeral Fruit on 07-30-15. Will begin nuerontin 100 mg nightly for his leg pain.   10. Status post falls and physical deconditioning: will continue therapy as directed to improve upon strength; gait; balance and adl training will monitor   11. Depression in patient with history of schizophrenia: will continue prozac 40 mg daily and remeron 30 mg nightly    Time spent with patient  50  minutes >50% time spent counseling; reviewing medical record; tests; labs; and developing future plan of care    Synthia Innocent NP [facility]  Contact [phone] Monday through Friday 8am- 5pm  After hours call [phone]

## 2015-07-29 ENCOUNTER — Emergency Department (HOSPITAL_COMMUNITY)
Admission: EM | Admit: 2015-07-29 | Discharge: 2015-07-30 | Disposition: A | Discharge status: Home / Self Care | Payer: Medicare Other | Attending: Psychiatry | Admitting: Psychiatry

## 2015-07-29 ENCOUNTER — Encounter (HOSPITAL_COMMUNITY): Payer: Self-pay | Admitting: Emergency Medicine

## 2015-07-29 ENCOUNTER — Emergency Department (HOSPITAL_COMMUNITY): Payer: Medicare Other

## 2015-07-29 DIAGNOSIS — E7800 Pure hypercholesterolemia, unspecified: Secondary | ICD-10-CM | POA: Insufficient documentation

## 2015-07-29 DIAGNOSIS — F209 Schizophrenia, unspecified: Secondary | ICD-10-CM | POA: Diagnosis not present

## 2015-07-29 DIAGNOSIS — M545 Low back pain: Secondary | ICD-10-CM | POA: Insufficient documentation

## 2015-07-29 DIAGNOSIS — G8929 Other chronic pain: Secondary | ICD-10-CM | POA: Insufficient documentation

## 2015-07-29 DIAGNOSIS — Z87891 Personal history of nicotine dependence: Secondary | ICD-10-CM | POA: Diagnosis not present

## 2015-07-29 DIAGNOSIS — Z7902 Long term (current) use of antithrombotics/antiplatelets: Secondary | ICD-10-CM | POA: Diagnosis not present

## 2015-07-29 DIAGNOSIS — F331 Major depressive disorder, recurrent, moderate: Secondary | ICD-10-CM

## 2015-07-29 DIAGNOSIS — Z79899 Other long term (current) drug therapy: Secondary | ICD-10-CM | POA: Diagnosis not present

## 2015-07-29 DIAGNOSIS — Z880 Allergy status to penicillin: Secondary | ICD-10-CM | POA: Diagnosis not present

## 2015-07-29 DIAGNOSIS — Z87442 Personal history of urinary calculi: Secondary | ICD-10-CM | POA: Insufficient documentation

## 2015-07-29 DIAGNOSIS — Z791 Long term (current) use of non-steroidal anti-inflammatories (NSAID): Secondary | ICD-10-CM | POA: Insufficient documentation

## 2015-07-29 DIAGNOSIS — I10 Essential (primary) hypertension: Secondary | ICD-10-CM | POA: Insufficient documentation

## 2015-07-29 DIAGNOSIS — K219 Gastro-esophageal reflux disease without esophagitis: Secondary | ICD-10-CM | POA: Insufficient documentation

## 2015-07-29 DIAGNOSIS — F332 Major depressive disorder, recurrent severe without psychotic features: Secondary | ICD-10-CM | POA: Diagnosis not present

## 2015-07-29 DIAGNOSIS — R45851 Suicidal ideations: Secondary | ICD-10-CM | POA: Diagnosis not present

## 2015-07-29 DIAGNOSIS — M549 Dorsalgia, unspecified: Secondary | ICD-10-CM

## 2015-07-29 LAB — COMPREHENSIVE METABOLIC PANEL
ALBUMIN: 3.2 g/dL — AB (ref 3.5–5.0)
ALT: 23 U/L (ref 17–63)
ANION GAP: 8 (ref 5–15)
AST: 24 U/L (ref 15–41)
Alkaline Phosphatase: 109 U/L (ref 38–126)
BILIRUBIN TOTAL: 0.5 mg/dL (ref 0.3–1.2)
BUN: 14 mg/dL (ref 6–20)
CHLORIDE: 104 mmol/L (ref 101–111)
CO2: 27 mmol/L (ref 22–32)
Calcium: 9.1 mg/dL (ref 8.9–10.3)
Creatinine, Ser: 0.68 mg/dL (ref 0.61–1.24)
GFR calc Af Amer: >60 mL/min (ref >60)
GFR calc non Af Amer: >60 mL/min (ref >60)
GLUCOSE: 102 mg/dL — AB (ref 65–99)
POTASSIUM: 3.7 mmol/L (ref 3.5–5.1)
SODIUM: 139 mmol/L (ref 135–145)
Total Protein: 6.6 g/dL (ref 6.5–8.1)

## 2015-07-29 LAB — CBC WITH DIFFERENTIAL/PLATELET
BASOS PCT: 0 %
Basophils Absolute: 0 10*3/uL (ref 0.0–0.1)
Eosinophils Absolute: 0.1 10*3/uL (ref 0.0–0.7)
Eosinophils Relative: 2 %
HEMATOCRIT: 36.6 % — AB (ref 39.0–52.0)
HEMOGLOBIN: 11.9 g/dL — AB (ref 13.0–17.0)
LYMPHS ABS: 1.3 10*3/uL (ref 0.7–4.0)
LYMPHS PCT: 24 %
MCH: 28.2 pg (ref 26.0–34.0)
MCHC: 32.5 g/dL (ref 30.0–36.0)
MCV: 86.7 fL (ref 78.0–100.0)
MONO ABS: 0.6 10*3/uL (ref 0.1–1.0)
MONOS PCT: 10 %
NEUTROS ABS: 3.7 10*3/uL (ref 1.7–7.7)
NEUTROS PCT: 64 %
Platelets: 202 10*3/uL (ref 150–400)
RBC: 4.22 MIL/uL (ref 4.22–5.81)
RDW: 15.7 % — AB (ref 11.5–15.5)
WBC: 5.7 10*3/uL (ref 4.0–10.5)

## 2015-07-29 LAB — URINALYSIS, ROUTINE W REFLEX MICROSCOPIC
Bilirubin Urine: NEGATIVE
Glucose, UA: NEGATIVE mg/dL
HGB URINE DIPSTICK: NEGATIVE
KETONES UR: NEGATIVE mg/dL
LEUKOCYTES UA: NEGATIVE
Nitrite: NEGATIVE
PROTEIN: NEGATIVE mg/dL
Specific Gravity, Urine: 1.016 (ref 1.005–1.030)
pH: 7 (ref 5.0–8.0)

## 2015-07-29 LAB — RAPID URINE DRUG SCREEN, HOSP PERFORMED
Amphetamines: NOT DETECTED
BARBITURATES: NOT DETECTED
Benzodiazepines: NOT DETECTED
COCAINE: NOT DETECTED
Opiates: NOT DETECTED
TETRAHYDROCANNABINOL: NOT DETECTED

## 2015-07-29 LAB — CBG MONITORING, ED: GLUCOSE-CAPILLARY: 87 mg/dL (ref 65–99)

## 2015-07-29 LAB — ACETAMINOPHEN LEVEL: Acetaminophen (Tylenol), Serum: <10 ug/mL — ABNORMAL LOW (ref 10–30)

## 2015-07-29 LAB — SALICYLATE LEVEL

## 2015-07-29 LAB — ETHANOL: Alcohol, Ethyl (B): <5 mg/dL (ref <5)

## 2015-07-29 MED ORDER — ACETAMINOPHEN 325 MG PO TABS: 650.0000 mg | ORAL_TABLET | ORAL | Status: DC | PRN

## 2015-07-29 MED ORDER — ONDANSETRON HCL 4 MG PO TABS: 4.0000 mg | ORAL_TABLET | Freq: Three times a day (TID) | ORAL | Status: DC | PRN

## 2015-07-29 MED ORDER — FLUOXETINE HCL 20 MG PO CAPS
40.0000 mg | ORAL_CAPSULE | Freq: Every day | ORAL | Status: DC
  Administered 2015-07-29 – 2015-07-30 (×2): 40 mg via ORAL
  Filled 2015-07-29 (×2): qty 2

## 2015-07-29 MED ORDER — ALUM & MAG HYDROXIDE-SIMETH 200-200-20 MG/5ML PO SUSP: 30.0000 mL | ORAL | Status: DC | PRN

## 2015-07-29 MED ORDER — AMLODIPINE BESYLATE 10 MG PO TABS
10.0000 mg | ORAL_TABLET | Freq: Every day | ORAL | Status: DC
  Administered 2015-07-30: 10 mg via ORAL
  Filled 2015-07-29: qty 1

## 2015-07-29 MED ORDER — ALBUTEROL SULFATE HFA 108 (90 BASE) MCG/ACT IN AERS: 2.0000 | INHALATION_SPRAY | RESPIRATORY_TRACT | Status: DC | PRN

## 2015-07-29 MED ORDER — LORAZEPAM 1 MG PO TABS: 0.0000 mg | ORAL_TABLET | Freq: Two times a day (BID) | ORAL | Status: DC

## 2015-07-29 MED ORDER — PRAVASTATIN SODIUM 40 MG PO TABS
40.0000 mg | ORAL_TABLET | Freq: Every day | ORAL | Status: DC
  Administered 2015-07-29: 40 mg via ORAL
  Filled 2015-07-29 (×2): qty 1

## 2015-07-29 MED ORDER — MIRABEGRON ER 25 MG PO TB24
25.0000 mg | ORAL_TABLET | Freq: Every day | ORAL | Status: DC
  Administered 2015-07-29 – 2015-07-30 (×2): 25 mg via ORAL
  Filled 2015-07-29 (×2): qty 1

## 2015-07-29 MED ORDER — OXYCODONE-ACETAMINOPHEN 5-325 MG PO TABS
1.0000 | ORAL_TABLET | ORAL | Status: DC | PRN
  Administered 2015-07-30 (×2): 1 via ORAL
  Filled 2015-07-29 (×2): qty 1

## 2015-07-29 MED ORDER — LORAZEPAM 1 MG PO TABS: 0.0000 mg | ORAL_TABLET | Freq: Four times a day (QID) | ORAL | Status: DC

## 2015-07-29 MED ORDER — ACETAMINOPHEN 500 MG PO TABS: 1000.0000 mg | ORAL_TABLET | Freq: Four times a day (QID) | ORAL | Status: DC | PRN

## 2015-07-29 MED ORDER — PANTOPRAZOLE SODIUM 40 MG PO TBEC
40.0000 mg | DELAYED_RELEASE_TABLET | Freq: Every day | ORAL | Status: DC
  Administered 2015-07-30 (×2): 40 mg via ORAL
  Filled 2015-07-29 (×2): qty 1

## 2015-07-29 MED ORDER — TIOTROPIUM BROMIDE MONOHYDRATE 18 MCG IN CAPS
18.0000 ug | ORAL_CAPSULE | Freq: Every day | RESPIRATORY_TRACT | Status: DC
Start: 2015-07-30 — End: 2015-07-30
  Administered 2015-07-30: 18 ug via RESPIRATORY_TRACT
  Filled 2015-07-29 (×2): qty 5

## 2015-07-29 MED ORDER — LORATADINE 10 MG PO TABS
10.0000 mg | ORAL_TABLET | Freq: Every day | ORAL | Status: DC
  Administered 2015-07-30 (×2): 10 mg via ORAL
  Filled 2015-07-29 (×2): qty 1

## 2015-07-29 MED ORDER — SENNA 8.6 MG PO TABS
1.0000 | ORAL_TABLET | Freq: Two times a day (BID) | ORAL | Status: DC
  Administered 2015-07-30 (×2): 8.6 mg via ORAL
  Filled 2015-07-29 (×2): qty 1

## 2015-07-29 MED ORDER — DOXAZOSIN MESYLATE 8 MG PO TABS
8.0000 mg | ORAL_TABLET | Freq: Every day | ORAL | Status: DC
  Filled 2015-07-29: qty 1

## 2015-07-29 MED ORDER — MIRTAZAPINE 30 MG PO TABS
30.0000 mg | ORAL_TABLET | Freq: Every day | ORAL | Status: DC
  Administered 2015-07-29: 30 mg via ORAL
  Filled 2015-07-29: qty 1

## 2015-07-29 MED ORDER — CLOPIDOGREL BISULFATE 75 MG PO TABS
75.0000 mg | ORAL_TABLET | Freq: Every day | ORAL | Status: DC
  Administered 2015-07-30: 75 mg via ORAL
  Filled 2015-07-29 (×2): qty 1

## 2015-07-29 MED ORDER — FINASTERIDE 5 MG PO TABS
5.0000 mg | ORAL_TABLET | Freq: Every day | ORAL | Status: DC
  Administered 2015-07-30: 5 mg via ORAL
  Filled 2015-07-29: qty 1

## 2015-07-29 NOTE — ED Provider Notes (Signed)
CSN: [id number]     Arrival date & time 07/29/15  1921 History   First MD Initiated Contact with Patient 07/29/15 2038     Chief Complaint  Patient presents with  . Pain  . Suicidal     (Consider location/radiation/quality/duration/timing/severity/associated sxs/prior Treatment) HPI   Norman Davis is a 75 y.o. male, with a history of Schizophrenia, chronic back pain, Paget's disease, and hypertension, presenting to the ED with two complaints: 1. Lower left back pain: Pt has chronic lower back pain, but states he had a fall over a week ago that made the pain worse. Pt rates his pain at 9/10, sharp, radiating down the back of his left leg. Pt last took his percocet at 1300 today. Pt denies fever/chills, N/V, abdominal pain, urinary complaints, neuro deficits, or any other related complaints. Pt ambulates with a walker or a cane. 2. Suicidal Ideations: Pt states he felt like he wanted to kill himself for the last two days. Pt states, "I would take my heating pad and pour water on it and electrocute myself." Pt denies taking alcohol, illicit drugs, or misusing his prescription medications. Pt denies HI. Denies A/V hallucinations. Pt states he has been taking all his medications.      Past Medical History  Diagnosis Date  . Schizophrenia (HCC)   . Hypercholesteremia   . Hypertension   . H/O urinary retention   . GERD (gastroesophageal reflux disease)   . Small bowel obstruction due to adhesions (HCC)   . 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 Nephrolithiasis     right  . Cholelithiasis   . Shortness of breath dyspnea   . Fall 07/22/2015   Past Surgical History  Procedure Laterality Date  . Total hip arthroplasty    . Pelvic fracture surgery    . Liver surgery     Family History  Problem Relation Age of Onset  . Cancer Mother   . Cancer Father    Social History  Substance Use Topics  . Smoking status: Former Smoker -- 1.00 packs/day    Types: Cigarettes   Quit date: 05/02/2000  . Smokeless tobacco: Never Used  . Alcohol Use: No    Review of Systems  Constitutional: Negative for fever and chills.  Respiratory: Negative for shortness of breath.   Cardiovascular: Negative for chest pain.  Gastrointestinal: Negative for nausea, vomiting, abdominal pain, diarrhea and constipation.  Musculoskeletal: Positive for back pain.  Skin: Negative for color change and pallor.  Psychiatric/Behavioral: Positive for suicidal ideas.  All other systems reviewed and are negative.     Allergies  Aspirin and Penicillins  Home Medications   Prior to Admission medications   Medication Sig Start Date End Date Taking? Authorizing Provider  acetaminophen (TYLENOL) 500 MG tablet Take 1,000 mg by mouth every 6 (six) hours as needed for mild pain.   Yes Historical Provider, MD  albuterol (PROVENTIL HFA;VENTOLIN HFA) 108 (90 BASE) MCG/ACT inhaler Inhale 2 puffs into the lungs every 4 (four) hours as needed for wheezing or shortness of breath.   Yes Historical Provider, MD  amLODipine (NORVASC) 10 MG tablet Take 10 mg by mouth daily.   Yes Historical Provider, MD  cetirizine (ZYRTEC) 10 MG tablet Take 10 mg by mouth at bedtime.    Yes Historical Provider, MD  clopidogrel (PLAVIX) 75 MG tablet Take 75 mg by mouth daily with breakfast.   Yes Historical Provider, MD  dextromethorphan-guaiFENesin (MUCINEX DM) 30-600 MG 12hr  tablet Take 1 tablet by mouth 2 (two) times daily. 06/15/15  Yes [name], DO  doxazosin (CARDURA) 8 MG tablet Take 8 mg by mouth at bedtime.   Yes Historical Provider, MD  finasteride (PROSCAR) 5 MG tablet Take 5 mg by mouth daily.   Yes Historical Provider, MD  FLUoxetine (PROZAC) 20 MG capsule Take 40 mg by mouth daily.    Yes Historical Provider, MD  meloxicam (MOBIC) 7.5 MG tablet Take 7.5 mg by mouth daily.   Yes Historical Provider, MD  Mirabegron (MYRBETRIQ PO) Take 25 mg by mouth daily.    Yes Historical Provider, MD  mirtazapine  (REMERON) 30 MG tablet Take 30 mg by mouth at bedtime.   Yes Historical Provider, MD  oxyCODONE-acetaminophen (PERCOCET/ROXICET) 5-325 MG tablet Take 1 tablet by mouth every 4 (four) hours as needed for moderate pain. 07/25/15  Yes Costin Otelia Sergeant, MD  pantoprazole (PROTONIX) 40 MG tablet Take 40 mg by mouth daily.   Yes Historical Provider, MD  pravastatin (PRAVACHOL) 40 MG tablet Take 40 mg by mouth at bedtime.    Yes Historical Provider, MD  senna (SENOKOT) 8.6 MG TABS tablet Take 1 tablet by mouth 2 (two) times daily.    Yes Historical Provider, MD  tiotropium (SPIRIVA) 18 MCG inhalation capsule Place 18 mcg into inhaler and inhale daily.   Yes Historical Provider, MD   BP 152/102 mmHg  Pulse 85  Temp(Src) 97.8 F (36.6 C) (Oral)  Resp 20  SpO2 97% Physical Exam  Constitutional: He is oriented to person, place, and time. He appears well-developed and well-nourished. No distress.  HENT:  Head: Normocephalic and atraumatic.  Eyes: Conjunctivae and EOM are normal. Pupils are equal, round, and reactive to light.  Neck: Normal range of motion. Neck supple.  Cardiovascular: Normal rate, regular rhythm, normal heart sounds and intact distal pulses.   Pulmonary/Chest: Effort normal and breath sounds normal. No respiratory distress.  Abdominal: Soft. There is no tenderness. There is no guarding.  Musculoskeletal: He exhibits no edema or tenderness.  Tenderness to the left lumbar musculature. Full ROM in all extremities and spine. No paraspinal tenderness.   Lymphadenopathy:    He has no cervical adenopathy.  Neurological: He is alert and oriented to person, place, and time. He has normal reflexes.  No sensory deficits. Strength 5/5 in all extremities. No gait disturbance. Coordination intact. Cranial nerves III-XII grossly intact. No facial droop.   Skin: Skin is warm and dry. He is not diaphoretic.  Psychiatric: He has a normal mood and affect. His behavior is normal.  Nursing note and  vitals reviewed.   ED Course  Procedures (including critical care time) Labs Review Labs Reviewed  COMPREHENSIVE METABOLIC PANEL - Abnormal; Notable for the following:    Glucose, Bld 102 (*)    Albumin 3.2 (*)    All other components within normal limits  CBC WITH DIFFERENTIAL/PLATELET - Abnormal; Notable for the following:    Hemoglobin 11.9 (*)    HCT 36.6 (*)    RDW 15.7 (*)    All other components within normal limits  URINALYSIS, ROUTINE W REFLEX MICROSCOPIC (NOT AT [facility]) - Abnormal; Notable for the following:    APPearance CLOUDY (*)    All other components within normal limits  ACETAMINOPHEN LEVEL - Abnormal; Notable for the following:    Acetaminophen (Tylenol), Serum <10 (*)    All other components within normal limits  ETHANOL  URINE RAPID DRUG SCREEN, HOSP PERFORMED  SALICYLATE LEVEL  CBG MONITORING, ED    Imaging Review Dg Chest 2 View  07/29/2015  CLINICAL DATA:  Initial evaluation for chronic back pain. EXAM: CHEST  2 VIEW COMPARISON:  Prior radiograph from 07/23/2015. FINDINGS: Cardiomegaly is stable from prior. Mediastinal silhouette within normal limits. Scattered atheromatous plaque within the aortic arch. Lungs are mildly hypoinflated with elevation left hemidiaphragm, similar to prior. Background bullous emphysema present. Patchy and linear opacities at the bilateral lung bases, left greater than right, favored to reflect atelectasis. No definite focal infiltrates. No pleural effusion. No pneumothorax. No acute osseous abnormality. IMPRESSION: 1. Shallow lung inflation with patchy left greater than right bibasilar opacities, likely atelectasis/bronchovascular crowding. No other active cardiopulmonary disease identified. 2. Emphysema. 3. Stable cardiomegaly without pulmonary edema. Electronically Signed   By: Rise MuBenjamin  McClintock M.D.   On: 07/29/2015 21:43   I have personally reviewed and evaluated these images and lab results as part of my medical  decision-making.   EKG Interpretation None      MDM   Final diagnoses:  Suicidal ideations  Chronic back pain    Norman Davis presents with suicidal ideations and chronic back pain.  Findings and plan of care discussed with Laurence Spatesachel Morgan Little, MD. Dr. Clarene DukeLittle personally evaluated and examined this patient.  Due to the patient's suicidal ideations with a plan, he will be given a TTS consult and placed in psych hold. Medical clearance orders placed. Home meds ordered.   Filed Vitals:   07/29/15 1933 07/29/15 1944  BP:  152/102  Pulse:  85  Temp:  97.8 F (36.6 C)  TempSrc:  Oral  Resp:  20  SpO2: 94% 97%      Anselm PancoastShawn C Jahayra Mazo, PA-C 07/30/15 0120  Laurence Spatesachel Morgan Little, MD 07/31/15 [phone]

## 2015-07-29 NOTE — ED Notes (Signed)
Per EMS- [facility] a [facility]. Patient states his pain meds are not working for his chronic back pain. Patient reported to staff that he had suicidal thoughts, but no plan. Patient denies HI.

## 2015-07-29 NOTE — BH Assessment (Signed)
Assessment completed. Consulted Donell SievertSpencer Simon, PA-C who recommended an AM psych eval.

## 2015-07-29 NOTE — BH Assessment (Addendum)
Tele Assessment Note   Norman Davis is an 75 y.o. male presenting to [facility] reporting suicidal ideations with a plan to electrocute himself. Pt stated "I am in so much pain and I can't get anyone to understand that". "I want to plug in my heating pad, pour water over it and electrocute myself". Pt did not report any previous suicide attempts or self-injurious behaviors. Pt denies HI and AVH at this time. PT did not report any issues with his sleep or appetite. Pt reported that he is currently receiving mental health treatment and reported a psychiatric admission in the past. Pt did not report any recent drug or alcohol abuse. PT denied any physical, sexual or emotional abuse at this time.  AM psych eval is recommended.    Diagnosis: Schizophrenia   Past Medical History:  Past Medical History  Diagnosis Date  . Schizophrenia (HCC)   . Hypercholesteremia   . Hypertension   . H/O urinary retention   . GERD (gastroesophageal reflux disease)   . Small bowel obstruction due to adhesions (HCC)   . 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 Nephrolithiasis     right  . Cholelithiasis   . Shortness of breath dyspnea   . Fall 07/22/2015    Past Surgical History  Procedure Laterality Date  . Total hip arthroplasty    . Pelvic fracture surgery    . Liver surgery      Family History:  Family History  Problem Relation Age of Onset  . Cancer Mother   . Cancer Father     Social History:  reports that he quit smoking about 15 years ago. His smoking use included Cigarettes. He smoked 1.00 pack per day. He has never used smokeless tobacco. He reports that he does not drink alcohol or use illicit drugs.  Additional Social History:  Alcohol / Drug Use History of alcohol / drug use?: No history of alcohol / drug abuse  CIWA: CIWA-Ar BP: (!) 152/102 mmHg Pulse Rate: 85 COWS:    PATIENT STRENGTHS: (choose at least two) Average or above average intelligence Motivation for  treatment/growth  Allergies:  Allergies  Allergen Reactions  . Aspirin     unknown  . Penicillins     Unknown Has patient had a PCN reaction causing immediate rash, facial/tongue/throat swelling, SOB or lightheadedness with hypotension: NO Has patient had a PCN reaction causing severe rash involving mucus membranes or skin necrosis: NO Has patient had a PCN reaction that required hospitalization NO Has patient had a PCN reaction occurring within the last 10 years: NO If all of the above answers are "NO", then may proceed with Cephalosporin use.    Home Medications:  (Not in a hospital admission)  OB/GYN Status:  No LMP for male patient.  General Assessment Data [facility] of Assessment: [facility] TTS Assessment: In system Is this a Tele or Face-to-Face Assessment?: Face-to-Face Is this an Initial Assessment or a Re-assessment for this encounter?: Initial Assessment Marital status: Single Living Arrangements: Other relatives Can pt return to current living arrangement?: Yes Admission Status: Voluntary Is patient capable of signing voluntary admission?: Yes Referral Source: Self/Family/Friend Insurance type: Medicare      Crisis Care Plan Living Arrangements: Other relatives Name of Therapist: No provider reported.   Education Status Is patient currently in school?: No  Risk to self with the past 6 months Suicidal Ideation: Yes-Currently Present Has patient been a risk to self within the past 6  months prior to admission? : No Suicidal Intent: Yes-Currently Present Has patient had any suicidal intent within the past 6 months prior to admission? : No Is patient at risk for suicide?: Yes Suicidal Plan?: Yes-Currently Present Has patient had any suicidal plan within the past 6 months prior to admission? : No Specify Current Suicidal Plan: electrocute self with heating pad  Access to Means: Yes Specify Access to Suicidal Means: access to heating pad  What has been your use of  drugs/alcohol within the last 12 months?: No drugs or alcohol reported.  Previous Attempts/Gestures: No How many times?: 0 Other Self Harm Risks: Pt denies  Triggers for Past Attempts: None known (No attempts reported. ) Intentional Self Injurious Behavior: None Family Suicide History: No Recent stressful life event(s): Other (Comment) (Chronic pain ) Persecutory voices/beliefs?: No Depression: No Depression Symptoms: Guilt, Feeling worthless/self pity Substance abuse history and/or treatment for substance abuse?: No Suicide prevention information given to non-admitted patients: Not applicable  Risk to Others within the past 6 months Homicidal Ideation: No Does patient have any lifetime risk of violence toward others beyond the six months prior to admission? : No Thoughts of Harm to Others: No Current Homicidal Intent: No Current Homicidal Plan: No Access to Homicidal Means: No Identified Victim: N/A History of harm to others?: No Assessment of Violence: None Noted Violent Behavior Description: No violent behaviors observed. Pt is calm and cooperative at this time.  Does patient have access to weapons?: No Criminal Charges Pending?: No Does patient have a court date: No Is patient on probation?: No  Psychosis Hallucinations: None noted Delusions: None noted  Mental Status Report Appearance/Hygiene: Body odor Eye Contact: Good Motor Activity: Unable to assess Speech: Logical/coherent Level of Consciousness: Quiet/awake Mood: Pleasant Affect: Appropriate to circumstance Anxiety Level: Minimal Thought Processes: Coherent, Relevant Judgement: Unimpaired Orientation: Appropriate for developmental age Obsessive Compulsive Thoughts/Behaviors: None  Cognitive Functioning Concentration: Normal Memory: Recent Intact, Remote Intact IQ: Average Insight: Fair Impulse Control: Fair Appetite: Good Weight Loss: 0 Weight Gain: 0 Sleep: No Change Total Hours of Sleep:  8 Vegetative Symptoms: None     Prior Inpatient Therapy Prior Inpatient Therapy: Yes Prior Therapy Dates: 2002 [facility] [place]  Prior Outpatient Therapy Prior Outpatient Therapy: No Does patient have an ACCT team?: No Does patient have Intensive In-House Services?  : No Does patient have [facility] services? : No Does patient have P4CC services?: No          Abuse/Neglect Assessment (Assessment to be complete while patient is alone) Physical Abuse: Denies Verbal Abuse: Denies Sexual Abuse: Denies Exploitation of patient/patient's resources: Denies Self-Neglect: Denies     Merchant navy officerAdvance Directives (For Healthcare) Does patient have an advance directive?: No    Additional Information 1:1 In Past 12 Months?: No CIRT Risk: No Elopement Risk: No Does patient have medical clearance?: Yes     Disposition:  Disposition Initial Assessment Completed for this Encounter: Yes  Baker Kogler S 07/29/2015 10:12 PM

## 2015-07-30 DIAGNOSIS — F331 Major depressive disorder, recurrent, moderate: Secondary | ICD-10-CM

## 2015-07-30 DIAGNOSIS — F332 Major depressive disorder, recurrent severe without psychotic features: Secondary | ICD-10-CM | POA: Diagnosis not present

## 2015-07-30 DIAGNOSIS — R45851 Suicidal ideations: Secondary | ICD-10-CM

## 2015-07-30 NOTE — ED Notes (Signed)
Bed: WA12 Expected date:  Expected time:  Means of arrival:  Comments: Hold for triage 3 

## 2015-07-30 NOTE — BHH Counselor (Signed)
Informed patients nurse that he is able to return to [facility].    Davina PokeJoVea Laury Huizar, LCSW Therapeutic Triage Specialist [facility] 07/30/2015 12:28 PM

## 2015-07-30 NOTE — Consult Note (Signed)
[facility] Face-to-Face Psychiatry Consult   Reason for Consult:  Having intrusive thoughts of suicide such as stepping in front of a car Referring Physician:  ED MD Patient Identification: Norman Davis MRN:  [id number] Principal Diagnosis: major depression, recurrent moderate Diagnosis:   Patient Active Problem List   Diagnosis Date Noted  . GERD without esophagitis [K21.9] 07/28/2015  . BPH (benign prostatic hyperplasia) [N40.0] 07/28/2015  . UI (urinary incontinence) [R32] 07/28/2015  . Fall [W19.XXXA] 07/22/2015  . HLD (hyperlipidemia) [E78.5] 07/22/2015  . Depression [F32.9] 07/22/2015  . Rhabdomyolysis [M62.82] 07/22/2015  . Hypokalemia [E87.6] 07/22/2015  . Elevated CK [R74.8]   . COPD exacerbation ([place]) [J44.1] 06/12/2015  . Elevated troponin [R79.89] 06/12/2015  . Elevated d-dimer [R79.1]   . Leg pain, left [M79.605] 04/28/2015  . Chest pain [R07.9] 04/28/2015  . NSTEMI (non-ST elevated myocardial infarction) ([place]) [I21.4] 04/28/2015  . Abdominal pain [R10.9] 04/28/2015  . COPD (chronic obstructive pulmonary disease) ([place]) [J44.9] 04/11/2013  . Hypercholesteremia [E78.00]   . Hypertension [I10]   . H/O urinary retention [Z87.448]   . Myocardial infarction ([place]) [I21.3]   . Gallstones [K80.20] 03/17/2013  . Renal stone [N20.0] 03/17/2013  . SBO (small bowel obstruction) ([place]) [K56.69] 03/16/2013  . AKI (acute kidney injury) ([place]) [N17.9] 03/16/2013  . High anion gap metabolic acidosis [D92.4] 03/16/2013  . Schizophrenia in remission [facility]) [F20.9] 03/16/2013    Total Time spent with patient: 30 minutes  Subjective:   Norman Davis is a 75 y.o. male patient admitted with suicidal thoughts.  HPI:  Norman Davis said he is having suicidal thoughts which are intrusive and not consistent with his feelings or wishes.  Says he has thoughts of stepping into traffic.  Does not want to die or to kill himself but the thoughts are annoying.  No stressors that he knows of.  Does have health  issues.  [employer] where he lives.  Says he had these thoughts about 15 years ago but has not had them till recently.  Says he takes his medication but it is not helping now.    Past Psychiatric History: denies inpatient recently  Risk to Self: Suicidal Ideation: Yes-Currently Present Suicidal Intent: Yes-Currently Present Is patient at risk for suicide?: Yes Suicidal Plan?: Yes-Currently Present Specify Current Suicidal Plan: electrocute self with heating pad  Access to Means: Yes Specify Access to Suicidal Means: access to heating pad  What has been your use of drugs/alcohol within the last 12 months?: No drugs or alcohol reported.  How many times?: 0 Other Self Harm Risks: Pt denies  Triggers for Past Attempts: None known (No attempts reported. ) Intentional Self Injurious Behavior: None Risk to Others: Homicidal Ideation: No Thoughts of Harm to Others: No Current Homicidal Intent: No Current Homicidal Plan: No Access to Homicidal Means: No Identified Victim: N/A History of harm to others?: No Assessment of Violence: None Noted Violent Behavior Description: No violent behaviors observed. Pt is calm and cooperative at this time.  Does patient have access to weapons?: No Criminal Charges Pending?: No Does patient have a court date: No Prior Inpatient Therapy: Prior Inpatient Therapy: Yes Prior Therapy Dates: 2002 [facility] [place] Prior Outpatient Therapy: Prior Outpatient Therapy: No Does patient have an ACCT team?: No Does patient have Intensive In-House Services?  : No Does patient have [facility] services? : No Does patient have P4CC services?: No  Past Medical History:  Past Medical History  Diagnosis Date  . Schizophrenia ([place])   .  Hypercholesteremia   . Hypertension   . H/O urinary retention   . GERD (gastroesophageal reflux disease)   . Small bowel obstruction due to adhesions ([place])   . Paget's disease of bone     right  femur and pelvis  . Nephrolithiasis     right  . Cholelithiasis   . Shortness of breath dyspnea   . Fall 07/22/2015    Past Surgical History  Procedure Laterality Date  . Total hip arthroplasty    . Pelvic fracture surgery    . Liver surgery     Family History:  Family History  Problem Relation Age of Onset  . Cancer Mother   . Cancer Father    Family Psychiatric  History: none reported Social History:  History  Alcohol Use No     History  Drug Use No    Social History   Social History  . Marital Status: Divorced    Spouse Name: N/A  . Number of Children: N/A  . Years of Education: N/A   Social History Main Topics  . Smoking status: Former Smoker -- 1.00 packs/day    Types: Cigarettes    Quit date: 05/02/2000  . Smokeless tobacco: Never Used  . Alcohol Use: No  . Drug Use: No  . Sexual Activity: No   Other Topics Concern  . None   Social History Narrative   Additional Social History:    Allergies:   Allergies  Allergen Reactions  . Aspirin     unknown  . Penicillins     Unknown Has patient had a PCN reaction causing immediate rash, facial/tongue/throat swelling, SOB or lightheadedness with hypotension: NO Has patient had a PCN reaction causing severe rash involving mucus membranes or skin necrosis: NO Has patient had a PCN reaction that required hospitalization NO Has patient had a PCN reaction occurring within the last 10 years: NO If all of the above answers are "NO", then may proceed with Cephalosporin use.    Labs:  Results for orders placed or performed during the hospital encounter of 07/29/15 (from the past 48 hour(s))  Urine rapid drug screen (hosp performed)not at [facility]     Status: None   Collection Time: 07/29/15  9:56 PM  Result Value Ref Range   Opiates NONE DETECTED NONE DETECTED   Cocaine NONE DETECTED NONE DETECTED   Benzodiazepines NONE DETECTED NONE DETECTED   Amphetamines NONE DETECTED NONE DETECTED   Tetrahydrocannabinol NONE  DETECTED NONE DETECTED   Barbiturates NONE DETECTED NONE DETECTED    Comment:        DRUG SCREEN FOR MEDICAL PURPOSES ONLY.  IF CONFIRMATION IS NEEDED FOR ANY PURPOSE, NOTIFY LAB WITHIN 5 DAYS.        LOWEST DETECTABLE LIMITS FOR URINE DRUG SCREEN Drug Class       Cutoff (ng/mL) Amphetamine      1000 Barbiturate      200 Benzodiazepine   546 Tricyclics       270 Opiates          300 Cocaine          300 THC              50   Urinalysis, Routine w reflex microscopic (not at [facility])     Status: Abnormal   Collection Time: 07/29/15  9:56 PM  Result Value Ref Range   Color, Urine YELLOW YELLOW   APPearance CLOUDY (A) CLEAR   Specific Gravity, Urine 1.016 1.005 - 1.030  pH 7.0 5.0 - 8.0   Glucose, UA NEGATIVE NEGATIVE mg/dL   Hgb urine dipstick NEGATIVE NEGATIVE   Bilirubin Urine NEGATIVE NEGATIVE   Ketones, ur NEGATIVE NEGATIVE mg/dL   Protein, ur NEGATIVE NEGATIVE mg/dL   Nitrite NEGATIVE NEGATIVE   Leukocytes, UA NEGATIVE NEGATIVE    Comment: MICROSCOPIC NOT DONE ON URINES WITH NEGATIVE PROTEIN, BLOOD, LEUKOCYTES, NITRITE, OR GLUCOSE <1000 mg/dL.  CBG monitoring, ED     Status: None   Collection Time: 07/29/15 10:21 PM  Result Value Ref Range   Glucose-Capillary 87 65 - 99 mg/dL   Comment 1 Notify RN   Comprehensive metabolic panel     Status: Abnormal   Collection Time: 07/29/15 11:01 PM  Result Value Ref Range   Sodium 139 135 - 145 mmol/L   Potassium 3.7 3.5 - 5.1 mmol/L   Chloride 104 101 - 111 mmol/L   CO2 27 22 - 32 mmol/L   Glucose, Bld 102 (H) 65 - 99 mg/dL   BUN 14 6 - 20 mg/dL   Creatinine, Ser 0.68 0.61 - 1.24 mg/dL   Calcium 9.1 8.9 - 10.3 mg/dL   Total Protein 6.6 6.5 - 8.1 g/dL   Albumin 3.2 (L) 3.5 - 5.0 g/dL   AST 24 15 - 41 U/L   ALT 23 17 - 63 U/L   Alkaline Phosphatase 109 38 - 126 U/L   Total Bilirubin 0.5 0.3 - 1.2 mg/dL   GFR calc non Af Amer >60 >60 mL/min   GFR calc Af Amer >60 >60 mL/min    Comment: (NOTE) The eGFR has been calculated  using the CKD EPI equation. This calculation has not been validated in all clinical situations. eGFR's persistently <60 mL/min signify possible Chronic Kidney Disease.    Anion gap 8 5 - 15  Ethanol     Status: None   Collection Time: 07/29/15 11:01 PM  Result Value Ref Range   Alcohol, Ethyl (B) <5 <5 mg/dL    Comment:        LOWEST DETECTABLE LIMIT FOR SERUM ALCOHOL IS 5 mg/dL FOR MEDICAL PURPOSES ONLY   CBC with Diff     Status: Abnormal   Collection Time: 07/29/15 11:01 PM  Result Value Ref Range   WBC 5.7 4.0 - 10.5 K/uL   RBC 4.22 4.22 - 5.81 MIL/uL   Hemoglobin 11.9 (L) 13.0 - 17.0 g/dL   HCT 36.6 (L) 39.0 - 52.0 %   MCV 86.7 78.0 - 100.0 fL   MCH 28.2 26.0 - 34.0 pg   MCHC 32.5 30.0 - 36.0 g/dL   RDW 15.7 (H) 11.5 - 15.5 %   Platelets 202 150 - 400 K/uL   Neutrophils Relative % 64 %   Neutro Abs 3.7 1.7 - 7.7 K/uL   Lymphocytes Relative 24 %   Lymphs Abs 1.3 0.7 - 4.0 K/uL   Monocytes Relative 10 %   Monocytes Absolute 0.6 0.1 - 1.0 K/uL   Eosinophils Relative 2 %   Eosinophils Absolute 0.1 0.0 - 0.7 K/uL   Basophils Relative 0 %   Basophils Absolute 0.0 0.0 - 0.1 K/uL  Salicylate level     Status: None   Collection Time: 07/29/15 11:01 PM  Result Value Ref Range   Salicylate Lvl <6.0 2.8 - 30.0 mg/dL  Acetaminophen level     Status: Abnormal   Collection Time: 07/29/15 11:01 PM  Result Value Ref Range   Acetaminophen (Tylenol), Serum <10 (L) 10 - 30 ug/mL  Comment:        THERAPEUTIC CONCENTRATIONS VARY SIGNIFICANTLY. A RANGE OF 10-30 ug/mL MAY BE AN EFFECTIVE CONCENTRATION FOR MANY PATIENTS. HOWEVER, SOME ARE BEST TREATED AT CONCENTRATIONS OUTSIDE THIS RANGE. ACETAMINOPHEN CONCENTRATIONS >150 ug/mL AT 4 HOURS AFTER INGESTION AND >50 ug/mL AT 12 HOURS AFTER INGESTION ARE OFTEN ASSOCIATED WITH TOXIC REACTIONS.     Current Facility-Administered Medications  Medication Dose Route Frequency Provider Last Rate Last Dose  . acetaminophen (TYLENOL)  tablet 1,000 mg  1,000 mg Oral Q6H PRN Shawn C Joy, PA-C      . acetaminophen (TYLENOL) tablet 650 mg  650 mg Oral Q4H PRN Shawn C Joy, PA-C      . albuterol (PROVENTIL HFA;VENTOLIN HFA) 108 (90 Base) MCG/ACT inhaler 2 puff  2 puff Inhalation Q4H PRN Shawn C Joy, PA-C      . alum & mag hydroxide-simeth (MAALOX/MYLANTA) 200-200-20 MG/5ML suspension 30 mL  30 mL Oral PRN Shawn C Joy, PA-C      . amLODipine (NORVASC) tablet 10 mg  10 mg Oral Daily Shawn C Joy, PA-C   10 mg at 07/30/15 0944  . clopidogrel (PLAVIX) tablet 75 mg  75 mg Oral Q breakfast Shawn C Joy, PA-C   75 mg at 07/30/15 0851  . doxazosin (CARDURA) tablet 8 mg  8 mg Oral QHS Shawn C Joy, PA-C      . finasteride (PROSCAR) tablet 5 mg  5 mg Oral Daily Shawn C Joy, PA-C   5 mg at 07/30/15 0944  . FLUoxetine (PROZAC) capsule 40 mg  40 mg Oral Daily Shawn C Joy, PA-C   40 mg at 07/30/15 0944  . loratadine (CLARITIN) tablet 10 mg  10 mg Oral Daily Shawn C Joy, PA-C   10 mg at 07/30/15 0944  . LORazepam (ATIVAN) tablet 0-4 mg  0-4 mg Oral 4 times per day Shawn C Joy, PA-C   0 mg at 07/30/15 0217   Followed by  . [START ON 08/01/2015] LORazepam (ATIVAN) tablet 0-4 mg  0-4 mg Oral Q12H Shawn C Joy, PA-C      . mirabegron ER (MYRBETRIQ) tablet 25 mg  25 mg Oral Daily Shawn C Joy, PA-C   25 mg at 07/30/15 0944  . mirtazapine (REMERON) tablet 30 mg  30 mg Oral QHS Shawn C Joy, PA-C   30 mg at 07/29/15 2359  . ondansetron (ZOFRAN) tablet 4 mg  4 mg Oral Q8H PRN Shawn C Joy, PA-C      . oxyCODONE-acetaminophen (PERCOCET/ROXICET) 5-325 MG per tablet 1 tablet  1 tablet Oral Q4H PRN Lorayne Bender, PA-C   1 tablet at 07/30/15 0855  . pantoprazole (PROTONIX) EC tablet 40 mg  40 mg Oral Daily Shawn C Joy, PA-C   40 mg at 07/30/15 0944  . pravastatin (PRAVACHOL) tablet 40 mg  40 mg Oral QHS Shawn C Joy, PA-C   40 mg at 07/29/15 2359  . senna (SENOKOT) tablet 8.6 mg  1 tablet Oral BID Shawn C Joy, PA-C   8.6 mg at 07/30/15 0944  . tiotropium (SPIRIVA)  inhalation capsule 18 mcg  18 mcg Inhalation Daily Lorayne Bender, PA-C   18 mcg at 07/30/15 1254   Current Outpatient Prescriptions  Medication Sig Dispense Refill  . acetaminophen (TYLENOL) 500 MG tablet Take 1,000 mg by mouth every 6 (six) hours as needed for mild pain.    Marland Kitchen albuterol (PROVENTIL HFA;VENTOLIN HFA) 108 (90 BASE) MCG/ACT inhaler Inhale 2 puffs into the lungs every  4 (four) hours as needed for wheezing or shortness of breath.    Marland Kitchen amLODipine (NORVASC) 10 MG tablet Take 10 mg by mouth daily.    . cetirizine (ZYRTEC) 10 MG tablet Take 10 mg by mouth at bedtime.     . clopidogrel (PLAVIX) 75 MG tablet Take 75 mg by mouth daily with breakfast.    . dextromethorphan-guaiFENesin (MUCINEX DM) 30-600 MG 12hr tablet Take 1 tablet by mouth 2 (two) times daily. 14 tablet 0  . doxazosin (CARDURA) 8 MG tablet Take 8 mg by mouth at bedtime.    . finasteride (PROSCAR) 5 MG tablet Take 5 mg by mouth daily.    Marland Kitchen FLUoxetine (PROZAC) 20 MG capsule Take 40 mg by mouth daily.     . meloxicam (MOBIC) 7.5 MG tablet Take 7.5 mg by mouth daily.    . Mirabegron (MYRBETRIQ PO) Take 25 mg by mouth daily.     . mirtazapine (REMERON) 30 MG tablet Take 30 mg by mouth at bedtime.    Marland Kitchen oxyCODONE-acetaminophen (PERCOCET/ROXICET) 5-325 MG tablet Take 1 tablet by mouth every 4 (four) hours as needed for moderate pain. 30 tablet 0  . pantoprazole (PROTONIX) 40 MG tablet Take 40 mg by mouth daily.    . pravastatin (PRAVACHOL) 40 MG tablet Take 40 mg by mouth at bedtime.     . senna (SENOKOT) 8.6 MG TABS tablet Take 1 tablet by mouth 2 (two) times daily.     Marland Kitchen tiotropium (SPIRIVA) 18 MCG inhalation capsule Place 18 mcg into inhaler and inhale daily.      Musculoskeletal: Strength & Muscle Tone: not asessed, he has spinal stenosis and a history of falls Gait & Station: in bed and did not test gait Patient leans: N/A  Psychiatric Specialty Exam: Review of Systems  Constitutional: Positive for malaise/fatigue.   HENT: Negative.   Eyes: Negative.   Respiratory: Positive for shortness of breath.   Cardiovascular: Negative.   Gastrointestinal: Negative.   Genitourinary: Negative.   Musculoskeletal: Positive for falls.  Skin: Negative.   Neurological: Negative.   Endo/Heme/Allergies: Negative.   Psychiatric/Behavioral: Positive for depression and suicidal ideas.    Blood pressure 157/106, pulse 95, temperature 98.6 F (37 C), temperature source Oral, resp. rate 16, SpO2 97 %.There is no weight on file to calculate BMI.  General Appearance: Fairly Groomed  Engineer, water::  Good  Speech:  Clear and Coherent  Volume:  Normal  Mood:  Depressed  Affect:  Congruent  Thought Process:  Coherent and Logical  Orientation:  Full (Time, Place, and Person)  Thought Content:  Negative  Suicidal Thoughts:  Yes.  without intent/plan  Homicidal Thoughts:  No  Memory:  Immediate;   Good Recent;   Good Remote;   Good  Judgement:  Intact  Insight:  Fair  Psychomotor Activity:  Did not test  Concentration:  Good  Recall:  Good  Fund of Knowledge:Good  Language: Good  Akathisia:  Negative  Handed:  Right  AIMS (if indicated):     Assets:  Communication Skills Desire for Improvement Housing  ADL's:  Intact  Cognition: WNL  Sleep:      Treatment Plan Summary: discharge to [facility] and continue current medications with current provider  Has suicidal thoughts but no interest on acting on them.   Do not see any evidence of schizophrenia on this visit  Disposition: No evidence of imminent risk to self or others at present.    Donnelly Angelica, MD 07/30/2015 1:00 PM

## 2015-07-30 NOTE — Discharge Instructions (Signed)
Patient has been cleared by psychiatry to return to [facility] at this time.

## 2015-07-30 NOTE — BHH Counselor (Signed)
After consult, Psychiatric Team recommends contacting [facility] to for patient to return to the facility.  Contacted [facility] at [phone] and spoke with [facility] who requested transportation stating that the facility does not coordinate transportation and that transportation would need to be coordinated by the ED. [facility]ial states that the facility is prepared to accept the patient back at this time.   Davina PokeJoVea Susette Seminara, LCSW Therapeutic Triage Specialist [facility] 07/30/2015 12:11 PM

## 2015-08-03 ENCOUNTER — Encounter: Payer: Self-pay | Admitting: Internal Medicine

## 2015-08-03 ENCOUNTER — Non-Acute Institutional Stay (SKILLED_NURSING_FACILITY): Payer: Medicare Other | Admitting: Internal Medicine

## 2015-08-03 DIAGNOSIS — R32 Unspecified urinary incontinence: Secondary | ICD-10-CM | POA: Diagnosis not present

## 2015-08-03 DIAGNOSIS — I10 Essential (primary) hypertension: Secondary | ICD-10-CM

## 2015-08-03 DIAGNOSIS — M4800 Spinal stenosis, site unspecified: Secondary | ICD-10-CM

## 2015-08-03 DIAGNOSIS — M6282 Rhabdomyolysis: Secondary | ICD-10-CM

## 2015-08-03 DIAGNOSIS — N400 Enlarged prostate without lower urinary tract symptoms: Secondary | ICD-10-CM

## 2015-08-03 DIAGNOSIS — F32A Depression, unspecified: Secondary | ICD-10-CM

## 2015-08-03 DIAGNOSIS — F209 Schizophrenia, unspecified: Secondary | ICD-10-CM | POA: Diagnosis not present

## 2015-08-03 DIAGNOSIS — F329 Major depressive disorder, single episode, unspecified: Secondary | ICD-10-CM

## 2015-08-03 DIAGNOSIS — E785 Hyperlipidemia, unspecified: Secondary | ICD-10-CM | POA: Diagnosis not present

## 2015-08-03 DIAGNOSIS — W19XXXA Unspecified fall, initial encounter: Secondary | ICD-10-CM | POA: Diagnosis not present

## 2015-08-03 DIAGNOSIS — J439 Emphysema, unspecified: Secondary | ICD-10-CM

## 2015-08-03 NOTE — Progress Notes (Signed)
Patient ID: Norman Davis, male   DOB: [birth year], 75 y.o.   MRN: [id number]    HISTORY AND PHYSICAL   DATE: 08/03/15  Location:  [facility] Room Number: 215 A Place of Service: SNF (31)   Extended Emergency Contact Information Primary Emergency Contact: Matthews,Lillie Address: [address]          [place], [place] [address] Johnnette Litter of [place] Phone: [phone] Mobile Phone: [phone] Relation: Sister Secondary Emergency Contact: Bethann Goo Address: [address]          [place], [place] [address] [place] Phone: [phone] Mobile Phone: [phone] Relation: Relative  Advanced Directive information Does patient have an advance directive?: No, Does patient want to make changes to advanced directive?: No - Patient declined  FULL CODE  Chief Complaint  Patient presents with  . New Admit To SNF    HPI:  75 yo male seen today as a new admission into SNF following hospital stay for nontraumatic rhabdomyolysis s/p fall, hypokalemia, schizophrenia, COPD, HTN, depression, hyperlipidemia, BPH, severe spinal stenosis. MRI brain revealed atrophy, chronic small vessel disease and scattered remote lacunar/cerebellar infarcts. CK 5464-->1551; Cr 1.13-->0.93; K+ 3.1-->4.3. He presents to SNF for short term rehab.   He c/o severe back pain today. Of note, he was sent to the ER 3/29th with SI but was returned to SNF after several hrs of observation followed by psych consult. He was not thought to be a harm to self or others. He is a poor historian due to schizophrenia. Hx obtained from chart. No nursing issues. No falls since admission. He does have urinary incontinence.   COPD - stable on spiriva daily; albuterol inhaler 2 puffs every 4 hours as needed; takes zyrtec 10 mg nightly and mucinex dm twice daily   Hypertension- BP stable on norvasc 10 mg daily; cardura 8 mg nightly   GERD - stable on protonix  40 mg daily   Hyperlipidemia - stable on pravachol 40 mg daily   BPH - he has incontinence. Takes proscar 5 mg daily; cardura 8 mg nightly; myrbetriq 25 mg daily   Constipation - stable on senna twice daily   hx NSTEMI- asymptomatic on plavix 75 mg daily   hx Severe spinal stenosis - takes mobic 7.5 mg daily and percocet 5/325 mg every 4 hours as needed for pain. He will see Dr. Joya Salm on 07-30-15.  nuerontin 100 mg nightly for his leg pain started about 1 week ago   Depression/schizophrenia - mood stable on prozac 40 mg daily and remeron 30 mg nightly    Past Medical History  Diagnosis Date  . Schizophrenia ([facility])   . Hypercholesteremia   . Hypertension   . H/O urinary retention   . GERD (gastroesophageal reflux disease)   . Small bowel obstruction due to adhesions ([place])   . 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 Nephrolithiasis     right  . Cholelithiasis   . Shortness of breath dyspnea   . Fall 07/22/2015    Past Surgical History  Procedure Laterality Date  . Total hip arthroplasty    . Pelvic fracture surgery    . Liver surgery      Patient Care Team: Gildardo Cranker, DO as PCP - General (Internal Medicine)  Social History   Social History  . Marital Status: Divorced    Spouse Name: N/A  . Number of Children: N/A  .  Years of Education: N/A   Occupational History  . Not on file.   Social History Main Topics  . Smoking status: Former Smoker -- 1.00 packs/day    Types: Cigarettes    Quit date: 05/02/2000  . Smokeless tobacco: Never Used  . Alcohol Use: No  . Drug Use: No  . Sexual Activity: No   Other Topics Concern  . Not on file   Social History Narrative     reports that he quit smoking about 15 years ago. His smoking use included Cigarettes. He smoked 1.00 pack per day. He has never used smokeless tobacco. He reports that he does not drink alcohol or use illicit drugs.  Family History  Problem Relation Age of Onset  . Cancer  Mother   . Cancer Father    Family Status  Relation Status Death Age  . Mother Deceased   . Father Deceased     Immunization History  Administered Date(s) Administered  . Influenza,inj,Quad PF,36+ Mos 03/18/2013  . Influenza-Unspecified 01/01/2015  . PPD Test 05/11/2015, 07/25/2015  . Pneumococcal Polysaccharide-23 03/18/2013    Allergies  Allergen Reactions  . Aspirin     unknown  . Penicillins     Unknown Has patient had a PCN reaction causing immediate rash, facial/tongue/throat swelling, SOB or lightheadedness with hypotension: NO Has patient had a PCN reaction causing severe rash involving mucus membranes or skin necrosis: NO Has patient had a PCN reaction that required hospitalization NO Has patient had a PCN reaction occurring within the last 10 years: NO If all of the above answers are "NO", then may proceed with Cephalosporin use.    Medications: Patient's Medications  New Prescriptions   No medications on file  Previous Medications   ACETAMINOPHEN (TYLENOL) 500 MG TABLET    Take 1,000 mg by mouth every 6 (six) hours as needed for mild pain.   ALBUTEROL (PROVENTIL HFA;VENTOLIN HFA) 108 (90 BASE) MCG/ACT INHALER    Inhale 2 puffs into the lungs every 4 (four) hours as needed for wheezing or shortness of breath.   AMLODIPINE (NORVASC) 10 MG TABLET    Take 10 mg by mouth daily.   CETIRIZINE (ZYRTEC) 10 MG TABLET    Take 10 mg by mouth at bedtime.    CLOPIDOGREL (PLAVIX) 75 MG TABLET    Take 75 mg by mouth daily with breakfast.   DEXTROMETHORPHAN-GUAIFENESIN (MUCINEX DM) 30-600 MG 12HR TABLET    Take 1 tablet by mouth 2 (two) times daily.   DOXAZOSIN (CARDURA) 8 MG TABLET    Take 8 mg by mouth at bedtime.   FINASTERIDE (PROSCAR) 5 MG TABLET    Take 5 mg by mouth daily.   FLUOXETINE (PROZAC) 20 MG CAPSULE    Take 40 mg by mouth daily.    MELOXICAM (MOBIC) 7.5 MG TABLET    Take 7.5 mg by mouth daily.   MIRABEGRON (MYRBETRIQ PO)    Take 25 mg by mouth daily.     MIRTAZAPINE (REMERON) 30 MG TABLET    Take 30 mg by mouth at bedtime.   OXYCODONE-ACETAMINOPHEN (PERCOCET/ROXICET) 5-325 MG TABLET    Take 1 tablet by mouth every 4 (four) hours as needed for moderate pain.   PANTOPRAZOLE (PROTONIX) 40 MG TABLET    Take 40 mg by mouth daily.   PRAVASTATIN (PRAVACHOL) 40 MG TABLET    Take 40 mg by mouth at bedtime.    SENNA (SENOKOT) 8.6 MG TABS TABLET    Take 1 tablet by mouth 2 (  two) times daily.    TIOTROPIUM (SPIRIVA) 18 MCG INHALATION CAPSULE    Place 18 mcg into inhaler and inhale daily.  Modified Medications   No medications on file  Discontinued Medications   No medications on file    Review of Systems  Unable to perform ROS: Psychiatric disorder    Filed Vitals:   08/03/15 1211  BP: 142/80  Pulse: 72  Temp: 97.3 F (36.3 C)  TempSrc: Oral  Resp: 12  Height: '5\' 9"'  (1.753 m)  Weight: 182 lb (82.555 kg)  SpO2: 97%   Body mass index is 26.86 kg/(m^2).  Physical Exam  Constitutional: He appears well-developed.  Frail appearing sitting up in bed in NAD. Disheveled, clothes wet with urine  HENT:  Mouth/Throat: Oropharynx is clear and moist.  Eyes: Pupils are equal, round, and reactive to light. No scleral icterus.  Neck: Neck supple. Carotid bruit is not present. No thyromegaly present.  Cardiovascular: Normal rate, regular rhythm, normal heart sounds and intact distal pulses.  Exam reveals no gallop and no friction rub.   No murmur heard. +1 pitting LE edema b/l. No calf TTP  Pulmonary/Chest: Effort normal and breath sounds normal. He has no wheezes. He has no rales. He exhibits no tenderness.  Abdominal: Soft. Bowel sounds are normal. He exhibits distension. He exhibits no abdominal bruit, no pulsatile midline mass and no mass. There is no tenderness. There is no rebound and no guarding.  Musculoskeletal: He exhibits edema and tenderness (b/l paraverterbral lumbar spine).  Lymphadenopathy:    He has no cervical adenopathy.    Neurological: He is alert.  Skin: Skin is warm and dry. No rash noted.  Psychiatric: He has a normal mood and affect. His behavior is normal.     Labs reviewed: Admission on 07/29/2015, Discharged on 07/30/2015  Component Date Value Ref Range Status  . Sodium 07/29/2015 139  135 - 145 mmol/L Final  . Potassium 07/29/2015 3.7  3.5 - 5.1 mmol/L Final  . Chloride 07/29/2015 104  101 - 111 mmol/L Final  . CO2 07/29/2015 27  22 - 32 mmol/L Final  . Glucose, Bld 07/29/2015 102* 65 - 99 mg/dL Final  . BUN 07/29/2015 14  6 - 20 mg/dL Final  . Creatinine, Ser 07/29/2015 0.68  0.61 - 1.24 mg/dL Final  . Calcium 07/29/2015 9.1  8.9 - 10.3 mg/dL Final  . Total Protein 07/29/2015 6.6  6.5 - 8.1 g/dL Final  . Albumin 07/29/2015 3.2* 3.5 - 5.0 g/dL Final  . AST 07/29/2015 24  15 - 41 U/L Final  . ALT 07/29/2015 23  17 - 63 U/L Final  . Alkaline Phosphatase 07/29/2015 109  38 - 126 U/L Final  . Total Bilirubin 07/29/2015 0.5  0.3 - 1.2 mg/dL Final  . GFR calc non Af Amer 07/29/2015 >60  >60 mL/min Final  . GFR calc Af Amer 07/29/2015 >60  >60 mL/min Final   Comment: (NOTE) The eGFR has been calculated using the CKD EPI equation. This calculation has not been validated in all clinical situations. eGFR's persistently <60 mL/min signify possible Chronic Kidney Disease.   . Anion gap 07/29/2015 8  5 - 15 Final  . Alcohol, Ethyl (B) 07/29/2015 <5  <5 mg/dL Final   Comment:        LOWEST DETECTABLE LIMIT FOR SERUM ALCOHOL IS 5 mg/dL FOR MEDICAL PURPOSES ONLY   . WBC 07/29/2015 5.7  4.0 - 10.5 K/uL Final  . RBC 07/29/2015 4.22  4.22 - 5.81  MIL/uL Final  . Hemoglobin 07/29/2015 11.9* 13.0 - 17.0 g/dL Final  . HCT 07/29/2015 36.6* 39.0 - 52.0 % Final  . MCV 07/29/2015 86.7  78.0 - 100.0 fL Final  . MCH 07/29/2015 28.2  26.0 - 34.0 pg Final  . MCHC 07/29/2015 32.5  30.0 - 36.0 g/dL Final  . RDW 07/29/2015 15.7* 11.5 - 15.5 % Final  . Platelets 07/29/2015 202  150 - 400 K/uL Final  .  Neutrophils Relative % 07/29/2015 64   Final  . Neutro Abs 07/29/2015 3.7  1.7 - 7.7 K/uL Final  . Lymphocytes Relative 07/29/2015 24   Final  . Lymphs Abs 07/29/2015 1.3  0.7 - 4.0 K/uL Final  . Monocytes Relative 07/29/2015 10   Final  . Monocytes Absolute 07/29/2015 0.6  0.1 - 1.0 K/uL Final  . Eosinophils Relative 07/29/2015 2   Final  . Eosinophils Absolute 07/29/2015 0.1  0.0 - 0.7 K/uL Final  . Basophils Relative 07/29/2015 0   Final  . Basophils Absolute 07/29/2015 0.0  0.0 - 0.1 K/uL Final  . Opiates 07/29/2015 NONE DETECTED  NONE DETECTED Final  . Cocaine 07/29/2015 NONE DETECTED  NONE DETECTED Final  . Benzodiazepines 07/29/2015 NONE DETECTED  NONE DETECTED Final  . Amphetamines 07/29/2015 NONE DETECTED  NONE DETECTED Final  . Tetrahydrocannabinol 07/29/2015 NONE DETECTED  NONE DETECTED Final  . Barbiturates 07/29/2015 NONE DETECTED  NONE DETECTED Final   Comment:        DRUG SCREEN FOR MEDICAL PURPOSES ONLY.  IF CONFIRMATION IS NEEDED FOR ANY PURPOSE, NOTIFY LAB WITHIN 5 DAYS.        LOWEST DETECTABLE LIMITS FOR URINE DRUG SCREEN Drug Class       Cutoff (ng/mL) Amphetamine      1000 Barbiturate      200 Benzodiazepine   116 Tricyclics       579 Opiates          300 Cocaine          300 THC              50   . Color, Urine 07/29/2015 YELLOW  YELLOW Final  . APPearance 07/29/2015 CLOUDY* CLEAR Final  . Specific Gravity, Urine 07/29/2015 1.016  1.005 - 1.030 Final  . pH 07/29/2015 7.0  5.0 - 8.0 Final  . Glucose, UA 07/29/2015 NEGATIVE  NEGATIVE mg/dL Final  . Hgb urine dipstick 07/29/2015 NEGATIVE  NEGATIVE Final  . Bilirubin Urine 07/29/2015 NEGATIVE  NEGATIVE Final  . Ketones, ur 07/29/2015 NEGATIVE  NEGATIVE mg/dL Final  . Protein, ur 07/29/2015 NEGATIVE  NEGATIVE mg/dL Final  . Nitrite 07/29/2015 NEGATIVE  NEGATIVE Final  . Leukocytes, UA 07/29/2015 NEGATIVE  NEGATIVE Final   MICROSCOPIC NOT DONE ON URINES WITH NEGATIVE PROTEIN, BLOOD, LEUKOCYTES, NITRITE, OR  GLUCOSE <1000 mg/dL.  Marland Kitchen Glucose-Capillary 07/29/2015 87  65 - 99 mg/dL Final  . Comment 1 07/29/2015 Notify RN   Final  . Salicylate Lvl 03/83/3383 <4.0  2.8 - 30.0 mg/dL Final  . Acetaminophen (Tylenol), Serum 07/29/2015 <10* 10 - 30 ug/mL Final   Comment:        THERAPEUTIC CONCENTRATIONS VARY SIGNIFICANTLY. A RANGE OF 10-30 ug/mL MAY BE AN EFFECTIVE CONCENTRATION FOR MANY PATIENTS. HOWEVER, SOME ARE BEST TREATED AT CONCENTRATIONS OUTSIDE THIS RANGE. ACETAMINOPHEN CONCENTRATIONS >150 ug/mL AT 4 HOURS AFTER INGESTION AND >50 ug/mL AT 12 HOURS AFTER INGESTION ARE OFTEN ASSOCIATED WITH TOXIC REACTIONS.   Admission on 07/21/2015, Discharged on 07/25/2015  Component Date Value Ref  Range Status  . WBC 07/22/2015 7.3  4.0 - 10.5 K/uL Final  . RBC 07/22/2015 4.14* 4.22 - 5.81 MIL/uL Final  . Hemoglobin 07/22/2015 11.4* 13.0 - 17.0 g/dL Final  . HCT 07/22/2015 35.7* 39.0 - 52.0 % Final  . MCV 07/22/2015 86.2  78.0 - 100.0 fL Final  . MCH 07/22/2015 27.5  26.0 - 34.0 pg Final  . MCHC 07/22/2015 31.9  30.0 - 36.0 g/dL Final  . RDW 07/22/2015 15.6* 11.5 - 15.5 % Final  . Platelets 07/22/2015 177  150 - 400 K/uL Final  . Neutrophils Relative % 07/22/2015 67   Final  . Neutro Abs 07/22/2015 4.9  1.7 - 7.7 K/uL Final  . Lymphocytes Relative 07/22/2015 21   Final  . Lymphs Abs 07/22/2015 1.5  0.7 - 4.0 K/uL Final  . Monocytes Relative 07/22/2015 12   Final  . Monocytes Absolute 07/22/2015 0.9  0.1 - 1.0 K/uL Final  . Eosinophils Relative 07/22/2015 0   Final  . Eosinophils Absolute 07/22/2015 0.0  0.0 - 0.7 K/uL Final  . Basophils Relative 07/22/2015 0   Final  . Basophils Absolute 07/22/2015 0.0  0.0 - 0.1 K/uL Final  . Sodium 07/22/2015 147* 135 - 145 mmol/L Final  . Potassium 07/22/2015 3.2* 3.5 - 5.1 mmol/L Final  . Chloride 07/22/2015 112* 101 - 111 mmol/L Final  . CO2 07/22/2015 24  22 - 32 mmol/L Final  . Glucose, Bld 07/22/2015 87  65 - 99 mg/dL Final  . BUN 07/22/2015 11  6 -  20 mg/dL Final  . Creatinine, Ser 07/22/2015 1.13  0.61 - 1.24 mg/dL Final  . Calcium 07/22/2015 9.1  8.9 - 10.3 mg/dL Final  . GFR calc non Af Amer 07/22/2015 >60  >60 mL/min Final  . GFR calc Af Amer 07/22/2015 >60  >60 mL/min Final   Comment: (NOTE) The eGFR has been calculated using the CKD EPI equation. This calculation has not been validated in all clinical situations. eGFR's persistently <60 mL/min signify possible Chronic Kidney Disease.   . Anion gap 07/22/2015 11  5 - 15 Final  . Total CK 07/22/2015 5464* 49 - 397 U/L Final   RESULTS CONFIRMED BY MANUAL DILUTION  . Color, Urine 07/22/2015 YELLOW  YELLOW Final  . APPearance 07/22/2015 CLEAR  CLEAR Final  . Specific Gravity, Urine 07/22/2015 1.033* 1.005 - 1.030 Final  . pH 07/22/2015 6.5  5.0 - 8.0 Final  . Glucose, UA 07/22/2015 NEGATIVE  NEGATIVE mg/dL Final  . Hgb urine dipstick 07/22/2015 LARGE* NEGATIVE Final  . Bilirubin Urine 07/22/2015 NEGATIVE  NEGATIVE Final  . Ketones, ur 07/22/2015 15* NEGATIVE mg/dL Final  . Protein, ur 07/22/2015 NEGATIVE  NEGATIVE mg/dL Final  . Nitrite 07/22/2015 NEGATIVE  NEGATIVE Final  . Leukocytes, UA 07/22/2015 NEGATIVE  NEGATIVE Final  . Squamous Epithelial / LPF 07/22/2015 0-5* NONE SEEN Final  . WBC, UA 07/22/2015 NONE SEEN  0 - 5 WBC/hpf Final  . RBC / HPF 07/22/2015 0-5  0 - 5 RBC/hpf Final  . Bacteria, UA 07/22/2015 RARE* NONE SEEN Final  . Magnesium 07/22/2015 1.5* 1.7 - 2.4 mg/dL Final  . Sodium 07/22/2015 145  135 - 145 mmol/L Final  . Potassium 07/22/2015 3.4* 3.5 - 5.1 mmol/L Final  . Chloride 07/22/2015 111  101 - 111 mmol/L Final  . CO2 07/22/2015 24  22 - 32 mmol/L Final  . Glucose, Bld 07/22/2015 84  65 - 99 mg/dL Final  . BUN 07/22/2015 9  6 -  20 mg/dL Final  . Creatinine, Ser 07/22/2015 1.04  0.61 - 1.24 mg/dL Final  . Calcium 07/22/2015 8.7* 8.9 - 10.3 mg/dL Final  . GFR calc non Af Amer 07/22/2015 >60  >60 mL/min Final  . GFR calc Af Amer 07/22/2015 >60  >60  mL/min Final   Comment: (NOTE) The eGFR has been calculated using the CKD EPI equation. This calculation has not been validated in all clinical situations. eGFR's persistently <60 mL/min signify possible Chronic Kidney Disease.   . Anion gap 07/22/2015 10  5 - 15 Final  . WBC 07/22/2015 6.7  4.0 - 10.5 K/uL Final  . RBC 07/22/2015 4.47  4.22 - 5.81 MIL/uL Final  . Hemoglobin 07/22/2015 12.4* 13.0 - 17.0 g/dL Final  . HCT 07/22/2015 38.8* 39.0 - 52.0 % Final  . MCV 07/22/2015 86.8  78.0 - 100.0 fL Final  . MCH 07/22/2015 27.7  26.0 - 34.0 pg Final  . MCHC 07/22/2015 32.0  30.0 - 36.0 g/dL Final  . RDW 07/22/2015 15.6* 11.5 - 15.5 % Final  . Platelets 07/22/2015 183  150 - 400 K/uL Final  . Total CK 07/22/2015 13077* 49 - 397 U/L Final   RESULTS CONFIRMED BY MANUAL DILUTION  . Glucose-Capillary 07/22/2015 111* 65 - 99 mg/dL Final  . Sodium 07/23/2015 143  135 - 145 mmol/L Final  . Potassium 07/23/2015 4.3  3.5 - 5.1 mmol/L Final  . Chloride 07/23/2015 110  101 - 111 mmol/L Final  . CO2 07/23/2015 23  22 - 32 mmol/L Final  . Glucose, Bld 07/23/2015 115* 65 - 99 mg/dL Final  . BUN 07/23/2015 6  6 - 20 mg/dL Final  . Creatinine, Ser 07/23/2015 0.93  0.61 - 1.24 mg/dL Final  . Calcium 07/23/2015 8.7* 8.9 - 10.3 mg/dL Final  . GFR calc non Af Amer 07/23/2015 >60  >60 mL/min Final  . GFR calc Af Amer 07/23/2015 >60  >60 mL/min Final   Comment: (NOTE) The eGFR has been calculated using the CKD EPI equation. This calculation has not been validated in all clinical situations. eGFR's persistently <60 mL/min signify possible Chronic Kidney Disease.   . Anion gap 07/23/2015 10  5 - 15 Final  . WBC 07/23/2015 6.9  4.0 - 10.5 K/uL Final  . RBC 07/23/2015 4.75  4.22 - 5.81 MIL/uL Final  . Hemoglobin 07/23/2015 13.0  13.0 - 17.0 g/dL Final  . HCT 07/23/2015 41.4  39.0 - 52.0 % Final  . MCV 07/23/2015 87.2  78.0 - 100.0 fL Final  . MCH 07/23/2015 27.4  26.0 - 34.0 pg Final  . MCHC 07/23/2015  31.4  30.0 - 36.0 g/dL Final  . RDW 07/23/2015 15.7* 11.5 - 15.5 % Final  . Platelets 07/23/2015 142* 150 - 400 K/uL Final  . Total CK 07/23/2015 7065* 49 - 397 U/L Final   RESULTS CONFIRMED BY MANUAL DILUTION  . Glucose-Capillary 07/23/2015 101* 65 - 99 mg/dL Final  . Troponin I 07/23/2015 <0.03  <0.031 ng/mL Final   Comment:        NO INDICATION OF MYOCARDIAL INJURY.   . Troponin I 07/23/2015 <0.03  <0.031 ng/mL Final   Comment:        NO INDICATION OF MYOCARDIAL INJURY.   . Troponin I 07/24/2015 <0.03  <0.031 ng/mL Final   Comment:        NO INDICATION OF MYOCARDIAL INJURY.   . Total CK 07/24/2015 3443* 49 - 397 U/L Final  . Glucose-Capillary 07/24/2015 97  65 -  99 mg/dL Final  . Total CK 07/25/2015 1551* 49 - 397 U/L Final  . Glucose-Capillary 07/25/2015 102* 65 - 99 mg/dL Final  Admission on 06/12/2015, Discharged on 06/15/2015  Component Date Value Ref Range Status  . WBC 06/12/2015 4.5  4.0 - 10.5 K/uL Final  . RBC 06/12/2015 4.48  4.22 - 5.81 MIL/uL Final  . Hemoglobin 06/12/2015 12.5* 13.0 - 17.0 g/dL Final  . HCT 06/12/2015 38.4* 39.0 - 52.0 % Final  . MCV 06/12/2015 85.7  78.0 - 100.0 fL Final  . MCH 06/12/2015 27.9  26.0 - 34.0 pg Final  . MCHC 06/12/2015 32.6  30.0 - 36.0 g/dL Final  . RDW 06/12/2015 14.5  11.5 - 15.5 % Final  . Platelets 06/12/2015 152  150 - 400 K/uL Final  . Sodium 06/12/2015 141  135 - 145 mmol/L Final  . Potassium 06/12/2015 3.9  3.5 - 5.1 mmol/L Final  . Chloride 06/12/2015 106  101 - 111 mmol/L Final  . CO2 06/12/2015 24  22 - 32 mmol/L Final  . Glucose, Bld 06/12/2015 174* 65 - 99 mg/dL Final  . BUN 06/12/2015 12  6 - 20 mg/dL Final  . Creatinine, Ser 06/12/2015 0.95  0.61 - 1.24 mg/dL Final  . Calcium 06/12/2015 9.2  8.9 - 10.3 mg/dL Final  . Total Protein 06/12/2015 6.5  6.5 - 8.1 g/dL Final  . Albumin 06/12/2015 3.3* 3.5 - 5.0 g/dL Final  . AST 06/12/2015 17  15 - 41 U/L Final  . ALT 06/12/2015 10* 17 - 63 U/L Final  . Alkaline  Phosphatase 06/12/2015 152* 38 - 126 U/L Final  . Total Bilirubin 06/12/2015 0.5  0.3 - 1.2 mg/dL Final  . GFR calc non Af Amer 06/12/2015 >60  >60 mL/min Final  . GFR calc Af Amer 06/12/2015 >60  >60 mL/min Final   Comment: (NOTE) The eGFR has been calculated using the CKD EPI equation. This calculation has not been validated in all clinical situations. eGFR's persistently <60 mL/min signify possible Chronic Kidney Disease.   . Anion gap 06/12/2015 11  5 - 15 Final  . Sodium 06/13/2015 142  135 - 145 mmol/L Final  . Potassium 06/13/2015 4.3  3.5 - 5.1 mmol/L Final  . Chloride 06/13/2015 107  101 - 111 mmol/L Final  . CO2 06/13/2015 25  22 - 32 mmol/L Final  . Glucose, Bld 06/13/2015 149* 65 - 99 mg/dL Final  . BUN 06/13/2015 16  6 - 20 mg/dL Final  . Creatinine, Ser 06/13/2015 0.97  0.61 - 1.24 mg/dL Final  . Calcium 06/13/2015 9.0  8.9 - 10.3 mg/dL Final  . GFR calc non Af Amer 06/13/2015 >60  >60 mL/min Final  . GFR calc Af Amer 06/13/2015 >60  >60 mL/min Final   Comment: (NOTE) The eGFR has been calculated using the CKD EPI equation. This calculation has not been validated in all clinical situations. eGFR's persistently <60 mL/min signify possible Chronic Kidney Disease.   . Anion gap 06/13/2015 10  5 - 15 Final  . WBC 06/13/2015 5.7  4.0 - 10.5 K/uL Final  . RBC 06/13/2015 4.26  4.22 - 5.81 MIL/uL Final  . Hemoglobin 06/13/2015 11.8* 13.0 - 17.0 g/dL Final  . HCT 06/13/2015 36.7* 39.0 - 52.0 % Final  . MCV 06/13/2015 86.2  78.0 - 100.0 fL Final  . MCH 06/13/2015 27.7  26.0 - 34.0 pg Final  . MCHC 06/13/2015 32.2  30.0 - 36.0 g/dL Final  . RDW 06/13/2015 14.6  11.5 - 15.5 % Final  . Platelets 06/13/2015 162  150 - 400 K/uL Final  . Troponin I 06/12/2015 0.31* <0.031 ng/mL Final   Comment:        PERSISTENTLY INCREASED TROPONIN VALUES IN THE RANGE OF 0.04-0.49 ng/mL CAN BE SEEN IN:       -UNSTABLE ANGINA       -CONGESTIVE HEART FAILURE       -MYOCARDITIS       -CHEST  TRAUMA       -ARRYHTHMIAS       -LATE PRESENTING MYOCARDIAL INFARCTION       -COPD   CLINICAL FOLLOW-UP RECOMMENDED.   Marland Kitchen Troponin I 06/13/2015 0.29* <0.031 ng/mL Final   Comment:        PERSISTENTLY INCREASED TROPONIN VALUES IN THE RANGE OF 0.04-0.49 ng/mL CAN BE SEEN IN:       -UNSTABLE ANGINA       -CONGESTIVE HEART FAILURE       -MYOCARDITIS       -CHEST TRAUMA       -ARRYHTHMIAS       -LATE PRESENTING MYOCARDIAL INFARCTION       -COPD   CLINICAL FOLLOW-UP RECOMMENDED.   Marland Kitchen Troponin I 06/13/2015 0.25* <0.031 ng/mL Final   Comment:        PERSISTENTLY INCREASED TROPONIN VALUES IN THE RANGE OF 0.04-0.49 ng/mL CAN BE SEEN IN:       -UNSTABLE ANGINA       -CONGESTIVE HEART FAILURE       -MYOCARDITIS       -CHEST TRAUMA       -ARRYHTHMIAS       -LATE PRESENTING MYOCARDIAL INFARCTION       -COPD   CLINICAL FOLLOW-UP RECOMMENDED.   Marland Kitchen Specimen Description 06/13/2015 BLOOD RIGHT ANTECUBITAL   Final  . Special Requests 06/13/2015 BOTTLES DRAWN AEROBIC AND ANAEROBIC 10CC   Final  . Culture 06/13/2015 NO GROWTH 5 DAYS   Final  . Report Status 06/13/2015 06/18/2015 FINAL   Final  . Specimen Description 06/13/2015 BLOOD LEFT ANTECUBITAL   Final  . Special Requests 06/13/2015 BOTTLES DRAWN AEROBIC AND ANAEROBIC 10CC   Final  . Culture 06/13/2015 NO GROWTH 5 DAYS   Final  . Report Status 06/13/2015 06/18/2015 FINAL   Final  . HIV Screen 4th Generation wRfx 06/13/2015 Non Reactive  Non Reactive Final   Comment: (NOTE) Performed At: [facility] [place], [place] [id number] Lindon Romp MD QJ:[id number]   . Strep Pneumo Urinary Antigen 06/13/2015 NEGATIVE  NEGATIVE Final   Comment:        Infection due to S. pneumoniae cannot be absolutely ruled out since the antigen present may be below the detection limit of the test.   . WBC 06/14/2015 8.3  4.0 - 10.5 K/uL Final  . RBC 06/14/2015 4.27  4.22 - 5.81 MIL/uL Final  . Hemoglobin 06/14/2015 11.6* 13.0  - 17.0 g/dL Final  . HCT 06/14/2015 36.8* 39.0 - 52.0 % Final  . MCV 06/14/2015 86.2  78.0 - 100.0 fL Final  . MCH 06/14/2015 27.2  26.0 - 34.0 pg Final  . MCHC 06/14/2015 31.5  30.0 - 36.0 g/dL Final  . RDW 06/14/2015 14.9  11.5 - 15.5 % Final  . Platelets 06/14/2015 167  150 - 400 K/uL Final  . Sodium 06/14/2015 141  135 - 145 mmol/L Final  . Potassium 06/14/2015 4.0  3.5 - 5.1 mmol/L Final  . Chloride 06/14/2015 109  101 - 111 mmol/L Final  . CO2 06/14/2015 25  22 - 32 mmol/L Final  . Glucose, Bld 06/14/2015 165* 65 - 99 mg/dL Final  . BUN 06/14/2015 20  6 - 20 mg/dL Final  . Creatinine, Ser 06/14/2015 1.16  0.61 - 1.24 mg/dL Final  . Calcium 06/14/2015 9.0  8.9 - 10.3 mg/dL Final  . GFR calc non Af Amer 06/14/2015 59* >60 mL/min Final  . GFR calc Af Amer 06/14/2015 >60  >60 mL/min Final   Comment: (NOTE) The eGFR has been calculated using the CKD EPI equation. This calculation has not been validated in all clinical situations. eGFR's persistently <60 mL/min signify possible Chronic Kidney Disease.   . Anion gap 06/14/2015 7  5 - 15 Final  . Troponin I 06/14/2015 0.22* <0.031 ng/mL Final   Comment:        PERSISTENTLY INCREASED TROPONIN VALUES IN THE RANGE OF 0.04-0.49 ng/mL CAN BE SEEN IN:       -UNSTABLE ANGINA       -CONGESTIVE HEART FAILURE       -MYOCARDITIS       -CHEST TRAUMA       -ARRYHTHMIAS       -LATE PRESENTING MYOCARDIAL INFARCTION       -COPD   CLINICAL FOLLOW-UP RECOMMENDED.   Marland Kitchen Troponin I 06/15/2015 0.13* <0.031 ng/mL Final   Comment:        PERSISTENTLY INCREASED TROPONIN VALUES IN THE RANGE OF 0.04-0.49 ng/mL CAN BE SEEN IN:       -UNSTABLE ANGINA       -CONGESTIVE HEART FAILURE       -MYOCARDITIS       -CHEST TRAUMA       -ARRYHTHMIAS       -LATE PRESENTING MYOCARDIAL INFARCTION       -COPD   CLINICAL FOLLOW-UP RECOMMENDED.     Dg Chest 2 View  07/29/2015  CLINICAL DATA:  Initial evaluation for chronic back pain. EXAM: CHEST  2 VIEW  COMPARISON:  Prior radiograph from 07/23/2015. FINDINGS: Cardiomegaly is stable from prior. Mediastinal silhouette within normal limits. Scattered atheromatous plaque within the aortic arch. Lungs are mildly hypoinflated with elevation left hemidiaphragm, similar to prior. Background bullous emphysema present. Patchy and linear opacities at the bilateral lung bases, left greater than right, favored to reflect atelectasis. No definite focal infiltrates. No pleural effusion. No pneumothorax. No acute osseous abnormality. IMPRESSION: 1. Shallow lung inflation with patchy left greater than right bibasilar opacities, likely atelectasis/bronchovascular crowding. No other active cardiopulmonary disease identified. 2. Emphysema. 3. Stable cardiomegaly without pulmonary edema. Electronically Signed   By: Jeannine Boga M.D.   On: 07/29/2015 21:43   Dg Chest 2 View  07/22/2015  CLINICAL DATA:  75 year old male with fall and left hip pain. Anterior chest wall pain. EXAM: CHEST  2 VIEW COMPARISON:  Radiograph dated 06/12/2015 FINDINGS: Two views of the chest demonstrate emphysematous changes of the lungs. There is no focal consolidation, pleural effusion, or pneumothorax. Stable cardiac silhouette. There is degenerative changes of the spine. No acute fracture. IMPRESSION: No acute intrathoracic pathology. Emphysema. Electronically Signed   By: Anner Crete M.D.   On: 07/22/2015 00:32   Dg Lumbar Spine Complete  07/21/2015  CLINICAL DATA:  Fall today.  Low back pain.  Prior lumbar surgery. EXAM: LUMBAR SPINE - COMPLETE 4+ VIEW COMPARISON:  CT abdomen and pelvis 04/29/2015. Lumbar spine MRI 04/28/2015. FINDINGS: Sequelae of L5-S1 posterior fusion are again identified. Advanced L4-5 disc degeneration is  again seen with severe disc space height loss and degenerative endplate spurring. Vertebral body heights are preserved without evidence of compression fracture. Slight anterolisthesis of L4 on L5 is grossly  unchanged. No pars defects are identified. Extensive prior internal fixation is noted involving the left iliac bone with screws traversing the left SI joint. 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 An IVC filter is noted at the L2 level. IMPRESSION: 1. Prior L5-S1 posterior fusion without radiographic evidence of are were complication. 2. 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 Electronically Signed   By: Logan Bores M.D.   On: 07/21/2015 23:32   Ct Head Wo Contrast  07/21/2015  CLINICAL DATA:  Fall today. No loss of consciousness. History of hypertension. EXAM: CT HEAD WITHOUT CONTRAST CT CERVICAL SPINE WITHOUT CONTRAST TECHNIQUE: Multidetector CT imaging of the head and cervical spine was performed following the standard protocol without intravenous contrast. Multiplanar CT image reconstructions of the cervical spine were also generated. COMPARISON:  None. FINDINGS: CT HEAD FINDINGS Diffuse cerebral atrophy. Ventricular dilatation consistent with central atrophy. Low-attenuation changes in the deep white matter are nonspecific but consistent with small vessel ischemia. No mass effect or midline shift. No abnormal extra-axial fluid collections. Gray-white matter junctions are distinct. Basal cisterns are not effaced. No evidence of acute intracranial hemorrhage. No depressed skull fractures. Mild mucosal thickening in the paranasal sinuses. Mastoid air cells are not opacified. Vascular calcifications. CT CERVICAL SPINE FINDINGS Straightening of the usual cervical lordosis. This may be due to patient positioning or degenerative changes but ligamentous injury or muscle spasm could also have this appearance and are not excluded. No anterior subluxation. Normal alignment of the facet joints. Degenerative changes throughout the cervical spine with narrowed interspaces and associated endplate hypertrophic changes. No vertebral compression  deformities. No prevertebral soft tissue swelling. C1-2 articulation appears intact. No focal bone lesion or bone destruction. Soft tissues are unremarkable. Severe bullous emphysematous changes demonstrated in the lung apices. IMPRESSION: No acute intracranial abnormalities. Chronic atrophy and small vessel ischemic changes. Nonspecific straightening of the usual cervical lordosis. Diffuse degenerative changes. No acute displaced fractures identified in the cervical spine. Electronically Signed   By: Lucienne Capers M.D.   On: 07/21/2015 23:44   Ct Cervical Spine Wo Contrast  07/21/2015  CLINICAL DATA:  Fall today. No loss of consciousness. History of hypertension. EXAM: CT HEAD WITHOUT CONTRAST CT CERVICAL SPINE WITHOUT CONTRAST TECHNIQUE: Multidetector CT imaging of the head and cervical spine was performed following the standard protocol without intravenous contrast. Multiplanar CT image reconstructions of the cervical spine were also generated. COMPARISON:  None. FINDINGS: CT HEAD FINDINGS Diffuse cerebral atrophy. Ventricular dilatation consistent with central atrophy. Low-attenuation changes in the deep white matter are nonspecific but consistent with small vessel ischemia. No mass effect or midline shift. No abnormal extra-axial fluid collections. Gray-white matter junctions are distinct. Basal cisterns are not effaced. No evidence of acute intracranial hemorrhage. No depressed skull fractures. Mild mucosal thickening in the paranasal sinuses. Mastoid air cells are not opacified. Vascular calcifications. CT CERVICAL SPINE FINDINGS Straightening of the usual cervical lordosis. This may be due to patient positioning or degenerative changes but ligamentous injury or muscle spasm could also have this appearance and are not excluded. No anterior subluxation. Normal alignment of the facet joints. Degenerative changes throughout the cervical spine with narrowed interspaces and associated endplate hypertrophic  changes. No vertebral compression deformities. No prevertebral soft tissue swelling. C1-2 articulation appears intact. No  focal bone lesion or bone destruction. Soft tissues are unremarkable. Severe bullous emphysematous changes demonstrated in the lung apices. IMPRESSION: No acute intracranial abnormalities. Chronic atrophy and small vessel ischemic changes. Nonspecific straightening of the usual cervical lordosis. Diffuse degenerative changes. No acute displaced fractures identified in the cervical spine. Electronically Signed   By: Lucienne Capers M.D.   On: 07/21/2015 23:44   Mr Brain Wo Contrast  07/22/2015  CLINICAL DATA:  Initial valuation for frequent falls. Evaluate for possible stroke. EXAM: MRI HEAD WITHOUT CONTRAST TECHNIQUE: Multiplanar, multiecho pulse sequences of the brain and surrounding structures were obtained without intravenous contrast. COMPARISON:  Prior CT from 07/21/2015. FINDINGS: Diffuse prominence of the CSF containing spaces compatible with moderate generalized cerebral atrophy. Patchy T2/FLAIR hyperintensity within the periventricular and deep white matter both cerebral hemispheres most consistent with chronic small vessel ischemic disease. Prominent chronic small vessel ischemic disease present within the pons. Scattered remote lacunar infarcts within the thalami and basal ganglia bilaterally. Additional remote lacunar infarct within the right corona radiata. Probable scatter remote bilateral cerebellar infarcts also noted. No abnormal foci of restricted diffusion to suggest acute intracranial infarct. Gray-white matter differentiation maintained. Major intracranial vascular flow voids preserved. Mild vertebrobasilar dolichoectasia noted. No acute intracranial hemorrhage. Single chronic probable punctate micro hemorrhage noted within the left upper lobe. No mass lesion, midline shift, or mass effect. No hydrocephalus. No extra-axial fluid collection. Major dural sinuses are grossly  patent. Craniocervical junction within normal limits. Scattered degenerative spondylolysis noted within the visualized upper cervical spine without significant stenosis. Pituitary gland normal. No acute abnormality about the orbits. Sequela prior lens extraction noted on the left. Scattered mucosal thickening within the paranasal sinuses. No air-fluid levels to suggest active sinus infection. Small right mastoid effusion. Inner ear structure grossly normal. Bone marrow signal intensity within normal limits. No scalp soft tissue abnormality. IMPRESSION: 1. No acute intracranial infarct or other abnormality identified. 2. Moderate cerebral atrophy with chronic small vessel ischemic disease and scattered remote lacunar and cerebellar infarcts as above. Electronically Signed   By: Jeannine Boga M.D.   On: 07/22/2015 06:37   Ct Abdomen Pelvis W Contrast  07/22/2015  CLINICAL DATA:  75 year old male with fall and anterior chest pain abdominal pain. Left hip pain. EXAM: CT ABDOMEN AND PELVIS WITH CONTRAST TECHNIQUE: Multidetector CT imaging of the abdomen and pelvis was performed using the standard protocol following bolus administration of intravenous contrast. CONTRAST:  114m OMNIPAQUE IOHEXOL 300 MG/ML  SOLN COMPARISON:  Lumbar spine Radiograph dated 07/21/2015 and CT dated 04/29/2015 FINDINGS: There is emphysematous changes of the lung bases. No intra-abdominal free air or free fluid. There multiple stones within the gallbladder. No pericholecystic fluid or evidence of inflammatory changes of the gallbladder by CT. Ultrasound may provide better evaluation of the gallbladder if clinically indicated. The liver, pancreas, spleen, adrenal glands appear unremarkable. The kidneys, visualized ureters appear unremarkable. The urinary bladder is predominantly collapsed. There is apparent diffuse thickening of the bladder wall which may be partly related to underdistention. Cystitis is not excluded. Correlation with  urinalysis recommended. Evaluation of the bowel is limited in the absence of oral contrast. There is moderate stool throughout the colon. No evidence of bowel obstruction or inflammation. There is segmental area of thickening in the distal aspect of the ascending colon which case related to underdistention. Underlying lesion is not excluded. Colonoscopy may provide better evaluation. The appendix is not visualized with certainty. No inflammatory changes identified in the right lower quadrant. The abdominal aorta  appears unremarkable. An infrarenal IVC filter is noted. No portal venous gas identified. There is no adenopathy. The abdominal wall soft tissues appear unremarkable. There is chronic sclerotic changes of the pelvic bone with trabecular thickening, possibly related to underlying Paget's disease. L5-S1 posterior fixation hardware. Multiple fixation screws noted in the left iliac bone. A stable appearing lucency in the inferior aspect of the L3 vertebra likely represent a Schmorl's node. No acute fracture identified. IMPRESSION: No acute/traumatic intra-abdominal or pelvic pathology. 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 Stable postsurgical changes of the left iliac bone with multiple fixation screws. No acute2 fracture. Cholelithiasis. Segmental thickening of the distal ascending colon possibly related to underdistention. Underlying lesion is not excluded. Electronically Signed   By: Anner Crete M.D.   On: 07/22/2015 02:33   Dg Chest Port 1 View  07/23/2015  CLINICAL DATA:  Left chest pain and shortness of breath EXAM: PORTABLE CHEST 1 VIEW COMPARISON:  07/21/2005, 04/29/2015 FINDINGS: 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 No superimposed pneumonia, collapse or consolidation. Negative for edema, significant effusion or pneumothorax. Mild cardiac enlargement with central vascular congestion. Trachea  is midline. Aorta is atherosclerotic and elongated. IMPRESSION: Background bullous emphysema.  No superimposed acute process Electronically Signed   By: Jerilynn Mages.  Shick M.D.   On: 07/23/2015 16:41   Dg Hip Unilat With Pelvis 2-3 Views Left  07/22/2015  CLINICAL DATA:  Fall. EXAM: DG HIP (WITH OR WITHOUT PELVIS) 2-3V LEFT COMPARISON:  None. FINDINGS: Diffuse bone demineralization. Trabecular coarsening in the pelvis and right hip may indicate Paget's disease. Degenerative changes in the hips. No evidence of acute fracture or dislocation of the pelvis or left hip. Bone cortex appears intact. SI joints and symphysis pubis are nondisplaced. Postoperative changes with posterior fixation of the lower lumbar spine and multiple screws and plates fixing the left iliac bone. Inferior vena caval filter. IMPRESSION: No acute bony abnormalities. Trabecular coarsening suggest possible Paget's disease. Degenerative changes in the hips. Postoperative changes in the lower lumbar spine and pelvis. Electronically Signed   By: Lucienne Capers M.D.   On: 07/22/2015 00:31     Assessment/Plan    ICD-9-CM ICD-10-CM   1. Non-traumatic rhabdomyolysis 728.88 M62.82   2. 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 Fall, initial encounter [phone] W19.XXXA   4. Pulmonary emphysema, unspecified emphysema type ([place]) 492.8 J43.9   5. Essential hypertension 401.9 I10   6. BPH (benign prostatic hyperplasia) 600.00 N40.0   7. Urinary incontinence, unspecified incontinence type 788.30 R32   8. Depression 311 F32.9   9. Hyperlipidemia 272.4 E78.5   10. Spinal stenosis, unspecified spinal region 724.00 M48.00    Cont current meds as ordered  Fall precautions  PT/OT/ST as ordered  Follow CPK  GOAL: short term rehab with potential for long term care vs ALF. Communicated with pt and nursing.  Will follow  Loc Feinstein S. Perlie Gold  [facility] [address] [place], [place]  [address] [phone] Cell (Monday-Friday 8 AM - 5 PM) [phone] After 5 PM and follow prompts

## 2015-08-07 DIAGNOSIS — R32 Unspecified urinary incontinence: Secondary | ICD-10-CM | POA: Insufficient documentation

## 2015-08-07 DIAGNOSIS — I10 Essential (primary) hypertension: Secondary | ICD-10-CM | POA: Insufficient documentation

## 2015-08-07 DIAGNOSIS — M4800 Spinal stenosis, site unspecified: Secondary | ICD-10-CM | POA: Insufficient documentation

## 2015-08-07 DIAGNOSIS — J439 Emphysema, unspecified: Secondary | ICD-10-CM | POA: Insufficient documentation

## 2015-08-21 ENCOUNTER — Other Ambulatory Visit: Payer: Self-pay | Admitting: Internal Medicine

## 2015-08-26 ENCOUNTER — Non-Acute Institutional Stay (SKILLED_NURSING_FACILITY): Payer: Medicare Other | Admitting: Adult Health

## 2015-08-26 ENCOUNTER — Encounter: Payer: Self-pay | Admitting: Adult Health

## 2015-08-26 DIAGNOSIS — K219 Gastro-esophageal reflux disease without esophagitis: Secondary | ICD-10-CM | POA: Diagnosis not present

## 2015-08-26 DIAGNOSIS — J439 Emphysema, unspecified: Secondary | ICD-10-CM

## 2015-08-26 DIAGNOSIS — F209 Schizophrenia, unspecified: Secondary | ICD-10-CM

## 2015-08-26 DIAGNOSIS — R109 Unspecified abdominal pain: Secondary | ICD-10-CM | POA: Diagnosis not present

## 2015-08-26 DIAGNOSIS — M6282 Rhabdomyolysis: Secondary | ICD-10-CM

## 2015-08-26 DIAGNOSIS — M4800 Spinal stenosis, site unspecified: Secondary | ICD-10-CM | POA: Diagnosis not present

## 2015-08-26 DIAGNOSIS — I10 Essential (primary) hypertension: Secondary | ICD-10-CM | POA: Diagnosis not present

## 2015-08-26 DIAGNOSIS — N179 Acute kidney failure, unspecified: Secondary | ICD-10-CM | POA: Diagnosis not present

## 2015-08-26 DIAGNOSIS — R32 Unspecified urinary incontinence: Secondary | ICD-10-CM | POA: Diagnosis not present

## 2015-08-26 NOTE — Progress Notes (Signed)
Patient ID: Norman Davis, male   DOB: 08/02/1940, 75 y.o.   MRN: [id number]  Location:  [facility] Room Number: 215 Place of Service:  SNF [phone])  Provider:  Peggye Ley, ANP [facility] [phone]   PCP: Kirt Boys, DO Patient Care Team: Kirt Boys, DO as PCP - General (Internal Medicine)  Extended Emergency Contact Information Primary Emergency Contact: Matthews,Lillie Address: [address]          [place], [place] [address] [place] Home Phone: [phone] Mobile Phone: [phone] Relation: Sister Secondary Emergency Contact: Leona Carry Address: [address]          [place], [place] [address] [place] Home Phone: [phone] Mobile Phone: [phone] Relation: Relative  Code Status: Full code Goals of care:  Advanced Directive information Advanced Directives 08/26/2015  Does patient have an advance directive? No  Does patient want to make changes to advanced directive? No - Patient declined     Allergies  Allergen Reactions  . Aspirin     unknown  . Penicillins     Unknown Has patient had a PCN reaction causing immediate rash, facial/tongue/throat swelling, SOB or lightheadedness with hypotension: NO Has patient had a PCN reaction causing severe rash involving mucus membranes or skin necrosis: NO Has patient had a PCN reaction that required hospitalization NO Has patient had a PCN reaction occurring within the last 10 years: NO If all of the above answers are "NO", then may proceed with Cephalosporin use.    Chief Complaint  Patient presents with  . Discharge Note    HPI:  75 y.o. male  Admitted to rehab at [facility] after a stay in the hospital for mechanical fall and rhabdomyolsis 3/21-3/25/17.  He was admitted to [facility] for PT/OT afterwards. He has made gaines with therapy and the staff has asked that I evaluate him for discharge. He has a hx of  schizophrenia and was sent to the ER for suicidal ideation but was sent back and deemed not a danger to himself. He is followed by the [place].  He denies any SI today. He reports getting angry over small things here at the facility and has the desire to harm another person but says that he would not do so and is able to self regulate. He is alert and oriented and calm for my visit and will be living with his sister.  He continues with bilateral lower ext pain and weakness, pain 8/10.  He was started on neurontin 100 mg qhs and states this has not helped. He had an MRI in Dec of 2016 showing spinal stenosis in the L4-5 region with nerve compression.  He was supposed to see Dr. Jeral Fruit on 3/30 for this reason but he states he did not, which may have been because he was suicidal at the time.   He had a bout of abd pain and was in the hospital (Jan of 2017) and had a CT of the abd which showed the following:  Apparent focal irregular wall thickening at the mid esophagus, with surrounding tiny vessels. This is unusual, and underlying esophageal mass with associated angiogenesis is a concern. Endoscopy would be helpful for further evaluation, when and as deemed clinically appropriate. He denies abd pain at this time but reports tenderness on palpation. Has normal BMs.  It does not appear that he followed up with GI regarding this issue. He has normal appetite, his weight is down 5  lbs in the past month.   Past Medical History  Diagnosis Date  . Schizophrenia (HCC)   . Hypercholesteremia   . Hypertension   . H/O urinary retention   . GERD (gastroesophageal reflux disease)   . Small bowel obstruction due to adhesions (HCC)   . 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 Nephrolithiasis     right  . Cholelithiasis   . Shortness of breath dyspnea   . Fall 07/22/2015    Past Surgical History  Procedure Laterality Date  . Total hip arthroplasty    . Pelvic fracture surgery    . Liver surgery         reports that he quit smoking about 15 years ago. His smoking use included Cigarettes. He smoked 1.00 pack per day. He has never used smokeless tobacco. He reports that he does not drink alcohol or use illicit drugs. Social History   Social History  . Marital Status: Divorced    Spouse Name: N/A  . Number of Children: N/A  . Years of Education: N/A   Occupational History  . Not on file.   Social History Main Topics  . Smoking status: Former Smoker -- 1.00 packs/day    Types: Cigarettes    Quit date: 05/02/2000  . Smokeless tobacco: Never Used  . Alcohol Use: No  . Drug Use: No  . Sexual Activity: No   Other Topics Concern  . Not on file   Social History Narrative   Functional Status Survey:    Allergies  Allergen Reactions  . Aspirin     unknown  . Penicillins     Unknown Has patient had a PCN reaction causing immediate rash, facial/tongue/throat swelling, SOB or lightheadedness with hypotension: NO Has patient had a PCN reaction causing severe rash involving mucus membranes or skin necrosis: NO Has patient had a PCN reaction that required hospitalization NO Has patient had a PCN reaction occurring within the last 10 years: NO If all of the above answers are "NO", then may proceed with Cephalosporin use.    Pertinent  Health Maintenance Due  Topic Date Due  . PNA vac Low Risk Adult (2 of 2 - PCV13) 07/27/2016 (Originally 03/18/2014)  . INFLUENZA VACCINE  12/01/2015    Medications:   Medication List       This list is accurate as of: 08/26/15  9:32 AM.  Always use your most recent med list.               acetaminophen 500 MG tablet  Commonly known as:  TYLENOL  Take 1,000 mg by mouth every 6 (six) hours as needed for mild pain.     albuterol 108 (90 Base) MCG/ACT inhaler  Commonly known as:  PROVENTIL HFA;VENTOLIN HFA  Inhale 2 puffs into the lungs every 4 (four) hours as needed for wheezing or shortness of breath.     amLODipine 10 MG tablet    Commonly known as:  NORVASC  Take 10 mg by mouth daily.     cetirizine 10 MG tablet  Commonly known as:  ZYRTEC  Take 10 mg by mouth at bedtime.     clopidogrel 75 MG tablet  Commonly known as:  PLAVIX  Take 75 mg by mouth daily with breakfast.     doxazosin 8 MG tablet  Commonly known as:  CARDURA  Take 8 mg by mouth at bedtime.     finasteride 5 MG tablet  Commonly known  as:  PROSCAR  Take 5 mg by mouth daily.     FLUoxetine 20 MG capsule  Commonly known as:  PROZAC  Take 40 mg by mouth daily.     gabapentin 100 MG capsule  Commonly known as:  NEURONTIN  Take 100 mg by mouth at bedtime.     meloxicam 7.5 MG tablet  Commonly known as:  MOBIC  Take 7.5 mg by mouth daily.     mirtazapine 30 MG tablet  Commonly known as:  REMERON  Take 30 mg by mouth at bedtime.     MYRBETRIQ PO  Take 25 mg by mouth daily.     oxyCODONE-acetaminophen 5-325 MG tablet  Commonly known as:  PERCOCET/ROXICET  Take 1 tablet by mouth every 4 (four) hours as needed for moderate pain.     paliperidone 156 MG/ML Susp injection  Commonly known as:  INVEGA SUSTENNA  Inject 156 mg into the muscle every 28 (twenty-eight) days.     pantoprazole 40 MG tablet  Commonly known as:  PROTONIX  Take 40 mg by mouth daily.     pravastatin 40 MG tablet  Commonly known as:  PRAVACHOL  Take 40 mg by mouth at bedtime.     pseudoephedrine-guaifenesin 60-600 MG 12 hr tablet  Commonly known as:  MUCINEX D  Take 1 tablet by mouth every 12 (twelve) hours.     senna 8.6 MG Tabs tablet  Commonly known as:  SENOKOT  Take 1 tablet by mouth 2 (two) times daily.     SPIRIVA HANDIHALER 18 MCG inhalation capsule  Generic drug:  tiotropium  INHALE 1 PUFF VIA HANDIHALER EVERY FOR COPD        Review of Systems  Constitutional: Positive for activity change. Negative for fever, chills, diaphoresis, appetite change and fatigue.  HENT: Negative for congestion.   Eyes: Negative for visual disturbance.   Respiratory: Negative for cough and shortness of breath.   Cardiovascular: Negative for chest pain, palpitations and leg swelling.  Gastrointestinal: Negative for nausea, abdominal pain, diarrhea, constipation and abdominal distention.  Genitourinary: Negative for dysuria and difficulty urinating.  Musculoskeletal: Positive for back pain, arthralgias and gait problem. Negative for myalgias, joint swelling and neck pain.  Skin: Negative for rash.  Neurological: Positive for weakness (BLE, tingling). Negative for dizziness, facial asymmetry, speech difficulty, numbness and headaches.  Psychiatric/Behavioral: Positive for dysphoric mood. Negative for suicidal ideas, hallucinations, behavioral problems, confusion, sleep disturbance, self-injury and agitation. The patient is nervous/anxious.     Filed Vitals:   08/26/15 0920  BP: 125/77  Pulse: 78  Temp: 97.5 F (36.4 C)  TempSrc: Oral  Resp: 20  Height:  (1.753 m)  Weight: 187 lb (84.823 kg)   Body mass index is 27.6 kg/(m^2).  Wt Readings from Last 3 Encounters:  08/26/15 187 lb (84.823 kg)  08/03/15 182 lb (82.555 kg)  07/28/15 193 lb (87.544 kg)   Physical Exam  Constitutional: He is oriented to person, place, and time. No distress.  Neck: No JVD present. No thyromegaly present.  Cardiovascular: Normal rate, regular rhythm and normal heart sounds.   Pulmonary/Chest: Effort normal and breath sounds normal. No respiratory distress.  Abdominal: Soft. Bowel sounds are normal. He exhibits no distension. There is tenderness (mid upper abd). There is no rebound and no guarding.  Neurological: He is alert and oriented to person, place, and time. No cranial nerve deficit.  Weakness noted to BLE 3/5 on the left, 4/5 on the right leg.  Diminished  reflexes noted to the patella and achilles area.    Skin: He is not diaphoretic.  Psychiatric: He has a normal mood and affect.    Labs reviewed: Basic Metabolic Panel:  Recent Labs   07/22/15 0455 07/23/15 0532 07/29/15 2301  NA 145 143 139  K 3.4* 4.3 3.7  CL 111 110 104  CO2 GLUCOSE 84 115* 102*  BUN CREATININE 1.04 0.93 0.68  CALCIUM 8.7* 8.7* 9.1  MG 1.5*  --   --    Liver Function Tests:  Recent Labs  04/29/15 0419 06/12/15 1826 07/29/15 2301  AST ALT 11* 10* 23  ALKPHOS 110 152* 109  BILITOT 0.7 0.5 0.5  PROT 5.9* 6.5 6.6  ALBUMIN 3.0* 3.3* 3.2*    Recent Labs  04/28/15 2355  LIPASE 22   No results for input(s): AMMONIA in the last 8760 hours. CBC:  Recent Labs  04/28/15 1838  07/22/15 0011 07/22/15 0455 07/23/15 0532 07/29/15 2301  WBC 6.4  < > 7.3 6.7 6.9 5.7  NEUTROABS 4.4  --  4.9  --   --  3.7  HGB 13.0  < > 11.4* 12.4* 13.0 11.9*  HCT 40.5  < > 35.7* 38.8* 41.4 36.6*  MCV 88.8  < > 86.2 86.8 87.2 86.7  PLT 166  < > 177 183 142* 202  < > = values in this interval not displayed. Cardiac Enzymes:  Recent Labs  07/23/15 0532 07/23/15 1626 07/23/15 2214 07/24/15 0406 07/25/15 0518  CKTOTAL 7065*  --   --  3443* 1551*  TROPONINI  --  <0.03 <0.03 <0.03  --    BNP: Invalid input(s): POCBNP CBG:  Recent Labs  07/24/15 0753 07/25/15 0738 07/29/15 2221  GLUCAP 97 102* 87    Procedures and Imaging Studies During Stay: Dg Chest 2 View  07/29/2015  CLINICAL DATA:  Initial evaluation for chronic back pain. EXAM: CHEST  2 VIEW COMPARISON:  Prior radiograph from 07/23/2015. FINDINGS: Cardiomegaly is stable from prior. Mediastinal silhouette within normal limits. Scattered atheromatous plaque within the aortic arch. Lungs are mildly hypoinflated with elevation left hemidiaphragm, similar to prior. Background bullous emphysema present. Patchy and linear opacities at the bilateral lung bases, left greater than right, favored to reflect atelectasis. No definite focal infiltrates. No pleural effusion. No pneumothorax. No acute osseous abnormality. IMPRESSION: 1. Shallow lung inflation with patchy left  greater than right bibasilar opacities, likely atelectasis/bronchovascular crowding. No other active cardiopulmonary disease identified. 2. Emphysema. 3. Stable cardiomegaly without pulmonary edema. Electronically Signed   By: Rise Mu M.D.   On: 07/29/2015 21:43   MRI of the lumbar spine 04/28/15 IMPRESSION: Severe multifactorial spinal stenosis at L4-5 related to 4 mm slip, posterior element hypertrophy, annular bulging, and osseous ridging. BILATERAL L4 and L5 nerve root impingement are noted. Please note that these changes could be worse with patient standing particularly in flexion, if there is dynamic instability.  Moderate to severe stenosis at L3-4, related to annular bulging and osseous ridging in conjunction with posterior element hypertrophy.  Moderate stenosis at L2-3 related to central and leftward protrusion as well as posterior element hypertrophy.  Solid appearing instrumented posterior and interbody fusion L5-S1.   Assessment/Plan:    1.  rhabdomyolysis -resolved  2. AKI (acute kidney injury) (HCC) -resolved -check CMP prior d/c  3. Pulmonary emphysema, unspecified emphysema type (HCC) -stable -continue spiriva, and prn albuterol  4. Essential hypertension -controlled -continue current  meds  5. GERD without esophagitis -stable continue protonix  6. 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) -stable but needs to be monitored closely -f[facility] [facility]  7. Spinal stenosis, unspecified spinal region -continues with pain, will increase neurontin to 100 mg TID and have the staff make an apt with Dr. Jeral FruitBotero as soon as possible -also has mobic and percocet prn   8. Urinary incontinence, unspecified incontinence type -noted, on myrbetriq see #7  9. Abdominal pain, unspecified abdominal location -?esophageal mass  Found in [place], not sure if he has followed up with GI -will ask staff to discuss with his sister and arrange apt  Patient is being discharged  with the following home health services:  PT OT  Patient is being discharged with the following durable medical equipment:  Rolling walker, 3 n 1  Patient has been advised to f/u with their PCP in 30 days to bring them up to date on their rehab stay.  Social services at facility was responsible for arranging this appointment.  Pt was provided with a 30 day supply of prescriptions for medications and refills must be obtained from their PCP.  For controlled substances, a more limited supply may be provided adequate until PCP appointment only.   Peggye Leyhristy Conlan Miceli, ANP [facility] [phone]

## 2015-09-17 ENCOUNTER — Emergency Department (HOSPITAL_COMMUNITY): Payer: Medicare Other

## 2015-09-17 ENCOUNTER — Emergency Department (HOSPITAL_COMMUNITY)
Admission: EM | Admit: 2015-09-17 | Discharge: 2015-09-18 | Disposition: A | Discharge status: Home / Self Care | Payer: Medicare Other | Attending: Emergency Medicine | Admitting: Emergency Medicine

## 2015-09-17 ENCOUNTER — Encounter (HOSPITAL_COMMUNITY): Payer: Self-pay | Admitting: Emergency Medicine

## 2015-09-17 DIAGNOSIS — E7800 Pure hypercholesterolemia, unspecified: Secondary | ICD-10-CM | POA: Insufficient documentation

## 2015-09-17 DIAGNOSIS — Z87891 Personal history of nicotine dependence: Secondary | ICD-10-CM | POA: Insufficient documentation

## 2015-09-17 DIAGNOSIS — Z7901 Long term (current) use of anticoagulants: Secondary | ICD-10-CM | POA: Insufficient documentation

## 2015-09-17 DIAGNOSIS — M25562 Pain in left knee: Secondary | ICD-10-CM

## 2015-09-17 DIAGNOSIS — Y929 Unspecified place or not applicable: Secondary | ICD-10-CM | POA: Insufficient documentation

## 2015-09-17 DIAGNOSIS — Y939 Activity, unspecified: Secondary | ICD-10-CM | POA: Diagnosis not present

## 2015-09-17 DIAGNOSIS — I10 Essential (primary) hypertension: Secondary | ICD-10-CM | POA: Diagnosis not present

## 2015-09-17 DIAGNOSIS — Y999 Unspecified external cause status: Secondary | ICD-10-CM | POA: Insufficient documentation

## 2015-09-17 DIAGNOSIS — S8002XA Contusion of left knee, initial encounter: Secondary | ICD-10-CM | POA: Diagnosis not present

## 2015-09-17 DIAGNOSIS — S8992XA Unspecified injury of left lower leg, initial encounter: Secondary | ICD-10-CM | POA: Diagnosis present

## 2015-09-17 DIAGNOSIS — W208XXA Other cause of strike by thrown, projected or falling object, initial encounter: Secondary | ICD-10-CM | POA: Diagnosis not present

## 2015-09-17 MED ORDER — ACETAMINOPHEN 325 MG PO TABS: 650.0000 mg | ORAL_TABLET | Freq: Once | ORAL | Status: DC

## 2015-09-17 MED ORDER — HYDROCODONE-ACETAMINOPHEN 5-325 MG PO TABS: 2.0000 | ORAL_TABLET | ORAL | Status: DC | PRN

## 2015-09-17 MED ORDER — HYDROCODONE-ACETAMINOPHEN 5-325 MG PO TABS
1.0000 | ORAL_TABLET | Freq: Once | ORAL | Status: AC
  Administered 2015-09-17: 1 via ORAL
  Filled 2015-09-17: qty 1

## 2015-09-17 NOTE — ED Notes (Signed)
Pt returned from CT °

## 2015-09-17 NOTE — ED Provider Notes (Signed)
CSN: [id number]     Arrival date & time 09/17/15  1534 History   First MD Initiated Contact with Patient 09/17/15 2116     Chief Complaint  Patient presents with  . Knee Injury     (Consider location/radiation/quality/duration/timing/severity/associated sxs/prior Treatment) HPI Comments: Patient is a 75 year old male with history of chronic low back and hip pain who presents with left knee pain. Patient states he was sitting when a bed was being moved around him and the bedpost fell on his left knee. Patient states it has been getting worse since it happened around 12:45 PM today. The patient states he cannot bear weight because of the pain. Patient denies chest pain, shortness of breath, abdominal pain, nausea, vomiting, dysuria.  The history is provided by the patient.    Past Medical History  Diagnosis Date  . Schizophrenia (HCC)   . Hypercholesteremia   . Hypertension   . H/O urinary retention   . GERD (gastroesophageal reflux disease)   . Small bowel obstruction due to adhesions (HCC)   . 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 Nephrolithiasis     right  . Cholelithiasis   . Shortness of breath dyspnea   . Fall 07/22/2015   Past Surgical History  Procedure Laterality Date  . Total hip arthroplasty    . Pelvic fracture surgery    . Liver surgery     Family History  Problem Relation Age of Onset  . Cancer Mother   . Cancer Father    Social History  Substance Use Topics  . Smoking status: Former Smoker -- 1.00 packs/day    Types: Cigarettes    Quit date: 05/02/2000  . Smokeless tobacco: Never Used  . Alcohol Use: No    Review of Systems  Constitutional: Negative for fever and chills.  HENT: Negative for facial swelling and sore throat.   Respiratory: Negative for shortness of breath.   Cardiovascular: Negative for chest pain.  Gastrointestinal: Negative for nausea, vomiting and abdominal pain.  Genitourinary: Negative for dysuria.    Musculoskeletal: Positive for arthralgias. Negative for back pain.  Skin: Negative for rash and wound.  Neurological: Negative for headaches.  Psychiatric/Behavioral: The patient is not nervous/anxious.       Allergies  Aspirin and Penicillins  Home Medications   Prior to Admission medications   Medication Sig Start Date End Date Taking? Authorizing Provider  albuterol (PROVENTIL HFA;VENTOLIN HFA) 108 (90 BASE) MCG/ACT inhaler Inhale 2 puffs into the lungs every 4 (four) hours as needed for wheezing or shortness of breath.   Yes Historical Provider, MD  amLODipine (NORVASC) 10 MG tablet Take 10 mg by mouth daily.   Yes Historical Provider, MD  cetirizine (ZYRTEC) 10 MG tablet Take 10 mg by mouth at bedtime.    Yes Historical Provider, MD  clopidogrel (PLAVIX) 75 MG tablet Take 75 mg by mouth daily with breakfast.   Yes Historical Provider, MD  doxazosin (CARDURA) 8 MG tablet Take 8 mg by mouth at bedtime.   Yes Historical Provider, MD  finasteride (PROSCAR) 5 MG tablet Take 5 mg by mouth daily.   Yes Historical Provider, MD  FLUoxetine (PROZAC) 20 MG capsule Take 40 mg by mouth daily.    Yes Historical Provider, MD  gabapentin (NEURONTIN) 100 MG capsule Take 100 mg by mouth at bedtime.   Yes Historical Provider, MD  meloxicam (MOBIC) 7.5 MG tablet Take 7.5 mg by mouth daily.   Yes Historical Provider,  MD  Mirabegron (MYRBETRIQ PO) Take 25 mg by mouth daily.    Yes Historical Provider, MD  mirtazapine (REMERON) 30 MG tablet Take 30 mg by mouth at bedtime.   Yes Historical Provider, MD  oxyCODONE-acetaminophen (PERCOCET/ROXICET) 5-325 MG tablet Take 1 tablet by mouth every 4 (four) hours as needed for moderate pain. 07/25/15  Yes Costin Otelia Sergeant, MD  pantoprazole (PROTONIX) 40 MG tablet Take 40 mg by mouth daily.   Yes Historical Provider, MD  pravastatin (PRAVACHOL) 40 MG tablet Take 40 mg by mouth at bedtime.    Yes Historical Provider, MD  senna (SENOKOT) 8.6 MG TABS tablet Take 1  tablet by mouth 2 (two) times daily.    Yes Historical Provider, MD  SPIRIVA HANDIHALER 18 MCG inhalation capsule INHALE 1 PUFF VIA HANDIHALER EVERY FOR COPD 08/21/15  Yes Margit Hanks, MD  acetaminophen (TYLENOL) 500 MG tablet Take 1,000 mg by mouth every 6 (six) hours as needed for mild pain.    Historical Provider, MD  HYDROcodone-acetaminophen (NORCO/VICODIN) 5-325 MG tablet Take 2 tablets by mouth every 4 (four) hours as needed for severe pain. 09/17/15   Emi Holes, PA-C  paliperidone (INVEGA SUSTENNA) 156 MG/ML SUSP injection Inject 156 mg into the muscle every 28 (twenty-eight) days.    Historical Provider, MD   BP 154/103 mmHg  Pulse 87  Temp(Src) 97.5 F (36.4 C) (Oral)  Resp 15  SpO2 93% Physical Exam  Constitutional: He appears well-developed and well-nourished. No distress.  HENT:  Head: Normocephalic and atraumatic.  Mouth/Throat: Oropharynx is clear and moist. No oropharyngeal exudate.  Eyes: Conjunctivae are normal. Pupils are equal, round, and reactive to light. Right eye exhibits no discharge. Left eye exhibits no discharge. No scleral icterus.  Neck: Normal range of motion. Neck supple. No thyromegaly present.  Cardiovascular: Normal rate, regular rhythm, normal heart sounds and intact distal pulses.  Exam reveals no gallop and no friction rub.   No murmur heard. Pulmonary/Chest: Effort normal and breath sounds normal. No stridor. No respiratory distress. He has no wheezes. He has no rales.  Abdominal: Soft. Bowel sounds are normal. He exhibits no distension. There is no tenderness. There is no rebound and no guarding.  Musculoskeletal: He exhibits no edema.       Left knee: He exhibits ecchymosis (over area tenderness). He exhibits normal range of motion, no effusion, no deformity, no laceration, no erythema, normal alignment, no LCL laxity and no MCL laxity. Tenderness found.       Legs: Pain to left knee with varus valgus movement; no laxity appreciated;  negative anterior and posterior drawer; normal sensation; 5/5 strength in bilateral lower extremities  Lymphadenopathy:    He has no cervical adenopathy.  Neurological: He is alert. Coordination normal.  Skin: Skin is warm and dry. No rash noted. He is not diaphoretic. No pallor.  Psychiatric: He has a normal mood and affect.  Nursing note and vitals reviewed.   ED Course  Procedures (including critical care time) Labs Review Labs Reviewed - No data to display  Imaging Review Ct Knee Left Wo Contrast  09/17/2015  CLINICAL DATA:  Medial left knee pain after struck with a bed post. Unable to bear weight. No deformity. EXAM: CT OF THE left knee KNEE WITHOUT CONTRAST TECHNIQUE: Multidetector CT imaging of the left knee knee was performed according to the standard protocol. Multiplanar CT image reconstructions were also generated. COMPARISON:  Left knee radiographs 09/17/2015 FINDINGS: Degenerative changes in the left knee with  medial compartment narrowing and small osteophytes in the medial and patellofemoral compartments. Subcortical degenerative cysts in the medial femoral condyle dilated medial tibial plateau. No loose bodies are identified. No evidence of acute fracture or dislocation in the left knee. Diffuse bone demineralization. No significant effusion. Soft tissues are unremarkable. Vascular calcifications. IMPRESSION: Degenerative changes in the left knee predominantly involving the medial and patellofemoral compartments. No evidence of acute fracture or dislocation. No significant effusion. If internal derangement is suspected, MRI would be more useful in evaluation. Electronically Signed   By: Burman Nieves M.D.   On: 09/17/2015 23:01   Dg Knee Complete 4 Views Left  09/17/2015  CLINICAL DATA:  Fall on left knee today, left anterior knee pain. EXAM: LEFT KNEE - COMPLETE 4+ VIEW COMPARISON:  None. FINDINGS: Osseous alignment is normal. Bone mineralization but is within normal limits. No  fracture line or displaced fracture fragment seen. No appreciable joint effusion and adjacent soft tissues are unremarkable. IMPRESSION: Negative. Electronically Signed   By: Bary Richard M.D.   On: 09/17/2015 16:42   I have personally reviewed and evaluated these images and lab results as part of my medical decision-making.   EKG Interpretation None      MDM   Left knee x-ray and CT negative for fracture and dislocation. Patient given Norco in ED. Patient fitted with knee sleeve. Will discharge patient home with pain medication with orthopedic follow-up. Patient understands and is in agreement with plan. Patient able to take 2 steps, which family states is baseline. Patient vitals stable and in satisfactory condition at discharge. Patient also evaluated by Dr. Criss Alvine who is agreement with plan.  Final diagnoses:  Lateral knee pain, left        Emi Holes, PA-C 09/18/15 1610  Pricilla Loveless, MD 09/18/15 [phone]

## 2015-09-17 NOTE — ED Notes (Signed)
Pt in CT.

## 2015-09-17 NOTE — ED Notes (Signed)
PA at bedside.

## 2015-09-17 NOTE — ED Notes (Signed)
Bed post fell over onto Pt's L knee. Unable to bear weight with EMS. Pt complains of medial knee pain, no deformity.

## 2015-09-17 NOTE — ED Notes (Signed)
Pt took two steps forward and two steps back with assistance

## 2015-09-18 NOTE — ED Notes (Signed)
Family member contacted regarding paperwork and prescription that was left behind; family member confirmed understanding that paperwork was here to be picked up

## 2015-09-18 NOTE — Discharge Instructions (Signed)
Medications: Norco  Treatment: Take Norco every 4-6 hours as needed for severe pain. Ice 3-4 times daily alternating 20 minutes on, 20 minutes off. Wear your knee brace all the time except when you bathe, and elevate your leg when you are sitting or sleeping.  Follow-up: Please follow-up with your primary care provider and/or the orthopedic doctor listed on your discharge paperwork for further evaluation if your symptoms persist. Please return the emergency department if you develop any new or worsening symptoms.   Knee Pain Knee pain is a very common symptom and can have many causes. Knee pain often goes away when you follow your health care provider's instructions for relieving pain and discomfort at home. However, knee pain can develop into a condition that needs treatment. Some conditions may include:  Arthritis caused by wear and tear (osteoarthritis).  Arthritis caused by swelling and irritation (rheumatoid arthritis or gout).  A cyst or growth in your knee.  An infection in your knee joint.  An injury that will not heal.  Damage, swelling, or irritation of the tissues that support your knee (torn ligaments or tendinitis). If your knee pain continues, additional tests may be ordered to diagnose your condition. Tests may include X-rays or other imaging studies of your knee. You may also need to have fluid removed from your knee. Treatment for ongoing knee pain depends on the cause, but treatment may include:  Medicines to relieve pain or swelling.  Steroid injections in your knee.  Physical therapy.  Surgery. HOME CARE INSTRUCTIONS  Take medicines only as directed by your health care provider.  Rest your knee and keep it raised (elevated) while you are resting.  Do not do things that cause or worsen pain.  Avoid high-impact activities or exercises, such as running, jumping rope, or doing jumping jacks.  Apply ice to the knee area:  Put ice in a plastic bag.  Place a  towel between your skin and the bag.  Leave the ice on for 20 minutes, 2-3 times a day.  Ask your health care provider if you should wear an elastic knee support.  Keep a pillow under your knee when you sleep.  Lose weight if you are overweight. Extra weight can put pressure on your knee.  Do not use any tobacco products, including cigarettes, chewing tobacco, or electronic cigarettes. If you need help quitting, ask your health care provider. Smoking may slow the healing of any bone and joint problems that you may have. SEEK MEDICAL CARE IF:  Your knee pain continues, changes, or gets worse.  You have a fever along with knee pain.  Your knee buckles or locks up.  Your knee becomes more swollen. SEEK IMMEDIATE MEDICAL CARE IF:   Your knee joint feels hot to the touch.  You have chest pain or trouble breathing.   This information is not intended to replace advice given to you by your health care provider. Make sure you discuss any questions you have with your health care provider.   Document Released: 02/13/2007 Document Revised: 05/09/2014 Document Reviewed: 12/02/2013 Elsevier Interactive Patient Education [employer]2016 Elsevier Inc.

## 2015-09-18 NOTE — ED Notes (Signed)
[facility] called for transport.  

## 2015-10-25 ENCOUNTER — Encounter: Payer: Self-pay | Admitting: Internal Medicine

## 2015-10-25 NOTE — Progress Notes (Signed)
Patient ID: Norman Davis, male   DOB: 09-Sep-1940, 75 y.o.   MRN: [id number]    HISTORY AND PHYSICAL   DATE: 05/07/15  Location:    [facility]   Place of Service: SNF (31)   Extended Emergency Contact Information Primary Emergency Contact: Matthews,Lillie Address: [address]          [place], [place] [address] Johnnette Litter of [place] Phone: [phone] Mobile Phone: [phone] Relation: Sister Secondary Emergency Contact: Bethann Goo Address: [address]          [place], [place] [address] Johnnette Litter of [place] Phone: [phone] Mobile Phone: [phone] Relation: Relative  Advanced Directive information  FULL CODE  Chief Complaint  Patient presents with  . New Admit To SNF    HPI:  75 yo male seen today as a new admission into SNF following hospital stay for chest pain, AKI, schizophrenia in remission, HTN, COPD, abdominal pain. Cr 1.25-->1.00 at d/c. MRI L-spine showed severe spinal stenosis and b/l L4-L5 nerve root impingement. 2D echo showed mild LVH and Ef 50-55%. Coreg started. CTA abdomen revealed focal irregular wall thickening at mid esophagus surrounding tiny vessels with esophageal mass with angiogenesis. He was told he will need outpt EGD. He presents to SNF for short term rehab.  Today he c/o LBP. No falls. No nursing issues. He is a poor historian due to psych d/o. Hx obtained from chart.  Schizophrenia/hx depression - in remission; takes prozac and remeron   GERD - takes protonix currently  Spinal stenosis - takes mobic  HTN - BP stable on norvasc and coreg. He is on plavix due to CP and ASA allergy  BPH - sx's stable mybetriq and proscar  Hyperlipidemia - stable on pravastatin    Past Medical History  Diagnosis Date  . Schizophrenia ([place])   . Hypercholesteremia   . Hypertension   . H/O urinary retention   . GERD (gastroesophageal reflux disease)   . Small bowel obstruction due to adhesions ([place])   .  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 Nephrolithiasis     right  . Cholelithiasis   . Shortness of breath dyspnea   . Fall 07/22/2015    Past Surgical History  Procedure Laterality Date  . Total hip arthroplasty    . Pelvic fracture surgery    . Liver surgery      Patient Care Team: Gildardo Cranker, DO as PCP - General (Internal Medicine) Leeroy Cha, MD as Consulting Physician (Neurosurgery)  Social History   Social History  . Marital Status: Divorced    Spouse Name: N/A  . Number of Children: N/A  . Years of Education: N/A   Occupational History  . Not on file.   Social History Main Topics  . Smoking status: Former Smoker -- 1.00 packs/day    Types: Cigarettes    Quit date: 05/02/2000  . Smokeless tobacco: Never Used  . Alcohol Use: No  . Drug Use: No  . Sexual Activity: No   Other Topics Concern  . Not on file   Social History Narrative     reports that he quit smoking about 15 years ago. His smoking use included Cigarettes. He smoked 1.00 pack per day. He has never used smokeless tobacco. He reports that he does not drink alcohol or use illicit drugs.  Family History  Problem Relation Age of Onset  . Cancer Mother   . Cancer Father    Family  Status  Relation Status Death Age  . Mother Deceased   . Father Deceased     Immunization History  Administered Date(s) Administered  . Influenza,inj,Quad PF,36+ Mos 03/18/2013  . Influenza-Unspecified 01/01/2015  . PPD Test 05/11/2015, 07/25/2015  . Pneumococcal Polysaccharide-23 03/18/2013    Allergies  Allergen Reactions  . Aspirin     unknown  . Penicillins     Unknown Has patient had a PCN reaction causing immediate rash, facial/tongue/throat swelling, SOB or lightheadedness with hypotension: NO Has patient had a PCN reaction causing severe rash involving mucus membranes or skin necrosis: NO Has patient had a PCN reaction that required hospitalization NO Has patient had a PCN  reaction occurring within the last 10 years: NO If all of the above answers are "NO", then may proceed with Cephalosporin use.    Medications: Patient's Medications  New Prescriptions   No medications on file  Previous Medications   ACETAMINOPHEN (TYLENOL) 500 MG TABLET    Take 1,000 mg by mouth every 6 (six) hours as needed for mild pain.   ALBUTEROL (PROVENTIL HFA;VENTOLIN HFA) 108 (90 BASE) MCG/ACT INHALER    Inhale 2 puffs into the lungs every 4 (four) hours as needed for wheezing or shortness of breath.   AMLODIPINE (NORVASC) 10 MG TABLET    Take 10 mg by mouth daily.   CETIRIZINE (ZYRTEC) 10 MG TABLET    Take 10 mg by mouth at bedtime.    CLOPIDOGREL (PLAVIX) 75 MG TABLET    Take 75 mg by mouth daily with breakfast.   DOXAZOSIN (CARDURA) 8 MG TABLET    Take 8 mg by mouth at bedtime.   FINASTERIDE (PROSCAR) 5 MG TABLET    Take 5 mg by mouth daily.   FLUOXETINE (PROZAC) 20 MG CAPSULE    Take 40 mg by mouth daily.    GABAPENTIN (NEURONTIN) 100 MG CAPSULE    Take 100 mg by mouth at bedtime.   HYDROCODONE-ACETAMINOPHEN (NORCO/VICODIN) 5-325 MG TABLET    Take 2 tablets by mouth every 4 (four) hours as needed for severe pain.   MELOXICAM (MOBIC) 7.5 MG TABLET    Take 7.5 mg by mouth daily.   MIRABEGRON (MYRBETRIQ PO)    Take 25 mg by mouth daily.    MIRTAZAPINE (REMERON) 30 MG TABLET    Take 30 mg by mouth at bedtime.   OXYCODONE-ACETAMINOPHEN (PERCOCET/ROXICET) 5-325 MG TABLET    Take 1 tablet by mouth every 4 (four) hours as needed for moderate pain.   PALIPERIDONE (INVEGA SUSTENNA) 156 MG/ML SUSP INJECTION    Inject 156 mg into the muscle every 28 (twenty-eight) days.   PANTOPRAZOLE (PROTONIX) 40 MG TABLET    Take 40 mg by mouth daily.   PRAVASTATIN (PRAVACHOL) 40 MG TABLET    Take 40 mg by mouth at bedtime.    SENNA (SENOKOT) 8.6 MG TABS TABLET    Take 1 tablet by mouth 2 (two) times daily.    SPIRIVA HANDIHALER 18 MCG INHALATION CAPSULE    INHALE 1 PUFF VIA HANDIHALER EVERY FOR COPD    Modified Medications   No medications on file  Discontinued Medications   No medications on file    Review of Systems  Unable to perform ROS: Psychiatric disorder    Filed Vitals:   05/07/15 1923  BP: 140/61  Pulse: 70  Temp: 98.1 F (36.7 C)  Weight: 190 lb 9.6 oz (86.456 kg)   Body mass index is 28.13 kg/(m^2).  Physical Exam  Constitutional:  He appears well-developed and well-nourished.  Sitting in w/c in NAD  HENT:  Mouth/Throat: Oropharynx is clear and moist.  Eyes: Pupils are equal, round, and reactive to light. No scleral icterus.  Neck: Neck supple. Carotid bruit is not present. No thyromegaly present.  Cardiovascular: Normal rate, regular rhythm and intact distal pulses.  Exam reveals no gallop and no friction rub.   Murmur (1/6 SEM) heard. R>L +1 pitting LE edema. No calf TTP  Pulmonary/Chest: Effort normal and breath sounds normal. He has no wheezes. He has no rales. He exhibits no tenderness.  Abdominal: Soft. Bowel sounds are normal. He exhibits no distension, no abdominal bruit, no pulsatile midline mass and no mass. There is no tenderness. There is no rebound and no guarding.  Musculoskeletal: He exhibits edema and tenderness.  L>R PSIS TTP; back brace intact  Lymphadenopathy:    He has no cervical adenopathy.  Neurological: He is alert. He has normal reflexes.  Skin: Skin is warm and dry. No rash noted.  Psychiatric: He has a normal mood and affect. His behavior is normal.     Labs reviewed: Admission on 04/28/2015, Discharged on 05/01/2015  Component Date Value Ref Range Status  . Sodium 04/28/2015 146* 135 - 145 mmol/L Final  . Potassium 04/28/2015 3.8  3.5 - 5.1 mmol/L Final  . Chloride 04/28/2015 115* 101 - 111 mmol/L Final  . CO2 04/28/2015 23  22 - 32 mmol/L Final  . Glucose, Bld 04/28/2015 108* 65 - 99 mg/dL Final  . BUN 04/28/2015 17  6 - 20 mg/dL Final  . Creatinine, Ser 04/28/2015 1.25* 0.61 - 1.24 mg/dL Final  . Calcium 04/28/2015 9.2   8.9 - 10.3 mg/dL Final  . GFR calc non Af Amer 04/28/2015 54* >60 mL/min Final  . GFR calc Af Amer 04/28/2015 >60  >60 mL/min Final   Comment: (NOTE) The eGFR has been calculated using the CKD EPI equation. This calculation has not been validated in all clinical situations. eGFR's persistently <60 mL/min signify possible Chronic Kidney Disease.   . Anion gap 04/28/2015 8  5 - 15 Final  . WBC 04/28/2015 6.4  4.0 - 10.5 K/uL Final  . RBC 04/28/2015 4.56  4.22 - 5.81 MIL/uL Final  . Hemoglobin 04/28/2015 13.0  13.0 - 17.0 g/dL Final  . HCT 04/28/2015 40.5  39.0 - 52.0 % Final  . MCV 04/28/2015 88.8  78.0 - 100.0 fL Final  . MCH 04/28/2015 28.5  26.0 - 34.0 pg Final  . MCHC 04/28/2015 32.1  30.0 - 36.0 g/dL Final  . RDW 04/28/2015 15.4  11.5 - 15.5 % Final  . Platelets 04/28/2015 166  150 - 400 K/uL Final  . Neutrophils Relative % 04/28/2015 68   Final  . Neutro Abs 04/28/2015 4.4  1.7 - 7.7 K/uL Final  . Lymphocytes Relative 04/28/2015 23   Final  . Lymphs Abs 04/28/2015 1.5  0.7 - 4.0 K/uL Final  . Monocytes Relative 04/28/2015 8   Final  . Monocytes Absolute 04/28/2015 0.5  0.1 - 1.0 K/uL Final  . Eosinophils Relative 04/28/2015 1   Final  . Eosinophils Absolute 04/28/2015 0.1  0.0 - 0.7 K/uL Final  . Basophils Relative 04/28/2015 0   Final  . Basophils Absolute 04/28/2015 0.0  0.0 - 0.1 K/uL Final  . Color, Urine 04/28/2015 AMBER* YELLOW Final   BIOCHEMICALS MAY BE AFFECTED BY COLOR  . APPearance 04/28/2015 CLEAR  CLEAR Final  . Specific Gravity, Urine 04/28/2015 1.025  1.005 -  1.030 Final  . pH 04/28/2015 6.0  5.0 - 8.0 Final  . Glucose, UA 04/28/2015 NEGATIVE  NEGATIVE mg/dL Final  . Hgb urine dipstick 04/28/2015 NEGATIVE  NEGATIVE Final  . Bilirubin Urine 04/28/2015 NEGATIVE  NEGATIVE Final  . Ketones, ur 04/28/2015 15* NEGATIVE mg/dL Final  . Protein, ur 04/28/2015 30* NEGATIVE mg/dL Final  . Nitrite 04/28/2015 NEGATIVE  NEGATIVE Final  . Leukocytes, UA 04/28/2015 NEGATIVE   NEGATIVE Final  . Troponin I 04/28/2015 0.11* <0.031 ng/mL Final   Comment:        PERSISTENTLY INCREASED TROPONIN VALUES IN THE RANGE OF 0.04-0.49 ng/mL CAN BE SEEN IN:       -UNSTABLE ANGINA       -CONGESTIVE HEART FAILURE       -MYOCARDITIS       -CHEST TRAUMA       -ARRYHTHMIAS       -LATE PRESENTING MYOCARDIAL INFARCTION       -COPD   CLINICAL FOLLOW-UP RECOMMENDED.   . B Natriuretic Peptide 04/28/2015 23.1  0.0 - 100.0 pg/mL Final  . Squamous Epithelial / LPF 04/28/2015 0-5* NONE SEEN Final  . WBC, UA 04/28/2015 NONE SEEN  0 - 5 WBC/hpf Final  . RBC / HPF 04/28/2015 NONE SEEN  0 - 5 RBC/hpf Final  . Bacteria, UA 04/28/2015 RARE* NONE SEEN Final  . Casts 04/28/2015 HYALINE CASTS* NEGATIVE Final  . Lipase 04/28/2015 22  11 - 51 U/L Final  . Hgb A1c MFr Bld 04/29/2015 5.8* 4.8 - 5.6 % Final   Comment: (NOTE)         Pre-diabetes: 5.7 - 6.4         Diabetes: >6.4         Glycemic control for adults with diabetes: <7.0   . Mean Plasma Glucose 04/29/2015 120   Final   Comment: (NOTE) Performed At: [facility] [place], [place] [id number] Lindon Romp MD SU:[id number]   . Cholesterol 04/29/2015 122  0 - 200 mg/dL Final  . Triglycerides 04/29/2015 31  <150 mg/dL Final  . HDL 04/29/2015 46  >40 mg/dL Final  . Total CHOL/HDL Ratio 04/29/2015 2.7   Final  . VLDL 04/29/2015 6  0 - 40 mg/dL Final  . LDL Cholesterol 04/29/2015 70  0 - 99 mg/dL Final   Comment:        Total Cholesterol/HDL:CHD Risk Coronary Heart Disease Risk Table                     Men   Women  1/2 Average Risk   3.4   3.3  Average Risk       5.0   4.4  2 X Average Risk   9.6   7.1  3 X Average Risk  23.4   11.0        Use the calculated Patient Ratio above and the CHD Risk Table to determine the patient's CHD Risk.        ATP III CLASSIFICATION (LDL):  <100     mg/dL   Optimal  100-129  mg/dL   Near or Above                    Optimal  130-159  mg/dL   Borderline   160-189  mg/dL   High  >190     mg/dL   Very High   . Troponin I 04/28/2015 0.13* <0.031 ng/mL Final   Comment:  PERSISTENTLY INCREASED TROPONIN VALUES IN THE RANGE OF 0.04-0.49 ng/mL CAN BE SEEN IN:       -UNSTABLE ANGINA       -CONGESTIVE HEART FAILURE       -MYOCARDITIS       -CHEST TRAUMA       -ARRYHTHMIAS       -LATE PRESENTING MYOCARDIAL INFARCTION       -COPD   CLINICAL FOLLOW-UP RECOMMENDED.   Marland Kitchen Troponin I 04/29/2015 0.13* <0.031 ng/mL Final   Comment:        PERSISTENTLY INCREASED TROPONIN VALUES IN THE RANGE OF 0.04-0.49 ng/mL CAN BE SEEN IN:       -UNSTABLE ANGINA       -CONGESTIVE HEART FAILURE       -MYOCARDITIS       -CHEST TRAUMA       -ARRYHTHMIAS       -LATE PRESENTING MYOCARDIAL INFARCTION       -COPD   CLINICAL FOLLOW-UP RECOMMENDED.   Marland Kitchen Troponin I 04/29/2015 0.11* <0.031 ng/mL Final   Comment:        PERSISTENTLY INCREASED TROPONIN VALUES IN THE RANGE OF 0.04-0.49 ng/mL CAN BE SEEN IN:       -UNSTABLE ANGINA       -CONGESTIVE HEART FAILURE       -MYOCARDITIS       -CHEST TRAUMA       -ARRYHTHMIAS       -LATE PRESENTING MYOCARDIAL INFARCTION       -COPD   CLINICAL FOLLOW-UP RECOMMENDED.   Marland Kitchen Prothrombin Time 04/28/2015 15.6* 11.6 - 15.2 seconds Final  . INR 04/28/2015 1.23  0.00 - 1.49 Final  . aPTT 04/28/2015 26  24 - 37 seconds Final  . Sodium 04/29/2015 145  135 - 145 mmol/L Final  . Potassium 04/29/2015 3.2* 3.5 - 5.1 mmol/L Final  . Chloride 04/29/2015 112* 101 - 111 mmol/L Final  . CO2 04/29/2015 24  22 - 32 mmol/L Final  . Glucose, Bld 04/29/2015 94  65 - 99 mg/dL Final  . BUN 04/29/2015 14  6 - 20 mg/dL Final  . Creatinine, Ser 04/29/2015 1.04  0.61 - 1.24 mg/dL Final  . Calcium 04/29/2015 8.7* 8.9 - 10.3 mg/dL Final  . Total Protein 04/29/2015 5.9* 6.5 - 8.1 g/dL Final  . Albumin 04/29/2015 3.0* 3.5 - 5.0 g/dL Final  . AST 04/29/2015 19  15 - 41 U/L Final  . ALT 04/29/2015 11* 17 - 63 U/L Final  . Alkaline Phosphatase  04/29/2015 110  38 - 126 U/L Final  . Total Bilirubin 04/29/2015 0.7  0.3 - 1.2 mg/dL Final  . GFR calc non Af Amer 04/29/2015 >60  >60 mL/min Final  . GFR calc Af Amer 04/29/2015 >60  >60 mL/min Final   Comment: (NOTE) The eGFR has been calculated using the CKD EPI equation. This calculation has not been validated in all clinical situations. eGFR's persistently <60 mL/min signify possible Chronic Kidney Disease.   . Anion gap 04/29/2015 9  5 - 15 Final  . WBC 04/29/2015 5.3  4.0 - 10.5 K/uL Final  . RBC 04/29/2015 4.04* 4.22 - 5.81 MIL/uL Final  . Hemoglobin 04/29/2015 11.7* 13.0 - 17.0 g/dL Final  . HCT 04/29/2015 35.9* 39.0 - 52.0 % Final  . MCV 04/29/2015 88.9  78.0 - 100.0 fL Final  . MCH 04/29/2015 29.0  26.0 - 34.0 pg Final  . MCHC 04/29/2015 32.6  30.0 - 36.0 g/dL Final  . RDW 04/29/2015  15.2  11.5 - 15.5 % Final  . Platelets 04/29/2015 143* 150 - 400 K/uL Final  . ABO/RH(D) 04/28/2015 B POS   Final  . Antibody Screen 04/28/2015 NEG   Final  . Sample Expiration 04/28/2015 05/01/2015   Final  . Heparin Unfractionated 04/29/2015 0.65  0.30 - 0.70 IU/mL Final   Comment:        IF HEPARIN RESULTS ARE BELOW EXPECTED VALUES, AND PATIENT DOSAGE HAS BEEN CONFIRMED, SUGGEST FOLLOW UP TESTING OF ANTITHROMBIN III LEVELS.   . ABO/RH(D) 04/28/2015 B POS   Final  . D-Dimer, Quant 04/29/2015 0.93* 0.00 - 0.50 ug/mL-FEU Final   Comment: (NOTE) At the manufacturer cut-off of 0.50 ug/mL FEU, this assay has been documented to exclude PE with a sensitivity and negative predictive value of 97 to 99%.  At this time, this assay has not been approved by the FDA to exclude DVT/VTE. Results should be correlated with clinical presentation.   . Sodium 04/30/2015 141  135 - 145 mmol/L Final  . Potassium 04/30/2015 3.7  3.5 - 5.1 mmol/L Final  . Chloride 04/30/2015 110  101 - 111 mmol/L Final  . CO2 04/30/2015 25  22 - 32 mmol/L Final  . Glucose, Bld 04/30/2015 101* 65 - 99 mg/dL Final  . BUN  04/30/2015 11  6 - 20 mg/dL Final  . Creatinine, Ser 04/30/2015 0.99  0.61 - 1.24 mg/dL Final  . Calcium 04/30/2015 8.7* 8.9 - 10.3 mg/dL Final  . GFR calc non Af Amer 04/30/2015 >60  >60 mL/min Final  . GFR calc Af Amer 04/30/2015 >60  >60 mL/min Final   Comment: (NOTE) The eGFR has been calculated using the CKD EPI equation. This calculation has not been validated in all clinical situations. eGFR's persistently <60 mL/min signify possible Chronic Kidney Disease.   . Anion gap 04/30/2015 6  5 - 15 Final  . Heparin Unfractionated 04/30/2015 0.65  0.30 - 0.70 IU/mL Final   Comment:        IF HEPARIN RESULTS ARE BELOW EXPECTED VALUES, AND PATIENT DOSAGE HAS BEEN CONFIRMED, SUGGEST FOLLOW UP TESTING OF ANTITHROMBIN III LEVELS.   . WBC 04/30/2015 5.1  4.0 - 10.5 K/uL Final  . RBC 04/30/2015 4.10* 4.22 - 5.81 MIL/uL Final  . Hemoglobin 04/30/2015 11.5* 13.0 - 17.0 g/dL Final  . HCT 04/30/2015 36.3* 39.0 - 52.0 % Final  . MCV 04/30/2015 88.5  78.0 - 100.0 fL Final  . MCH 04/30/2015 28.0  26.0 - 34.0 pg Final  . MCHC 04/30/2015 31.7  30.0 - 36.0 g/dL Final  . RDW 04/30/2015 15.0  11.5 - 15.5 % Final  . Platelets 04/30/2015 143* 150 - 400 K/uL Final  . Sodium 05/01/2015 142  135 - 145 mmol/L Final  . Potassium 05/01/2015 3.6  3.5 - 5.1 mmol/L Final  . Chloride 05/01/2015 110  101 - 111 mmol/L Final  . CO2 05/01/2015 25  22 - 32 mmol/L Final  . Glucose, Bld 05/01/2015 91  65 - 99 mg/dL Final  . BUN 05/01/2015 10  6 - 20 mg/dL Final  . Creatinine, Ser 05/01/2015 1.00  0.61 - 1.24 mg/dL Final  . Calcium 05/01/2015 9.1  8.9 - 10.3 mg/dL Final  . GFR calc non Af Amer 05/01/2015 >60  >60 mL/min Final  . GFR calc Af Amer 05/01/2015 >60  >60 mL/min Final   Comment: (NOTE) The eGFR has been calculated using the CKD EPI equation. This calculation has not been validated in all clinical situations.  eGFR's persistently <60 mL/min signify possible Chronic Kidney Disease.   . Anion gap  05/01/2015 7  5 - 15 Final  . Glucose-Capillary 05/01/2015 77  65 - 99 mg/dL Final    No results found.   Assessment/Plan   ICD-9-CM ICD-10-CM   1. Spinal stenosis, unspecified spinal region 724.00 M48.00   2. Chest pain, unspecified chest pain type 786.50 R07.9   3. Schizophrenia in remission ([place]) 295.95 F20.9   4. Essential hypertension 401.9 I10   5. Pulmonary emphysema, unspecified emphysema type ([place]) 492.8 J43.9   6. Abdominal pain, unspecified abdominal location 789.00 R10.9       f/u with GI for outpt EGD  Cont current meds as ordered  F/u with specialists as scheduled  PT/OT/ST as ordered  GOAL: short term rehab and d/c home when medically appropriate. Communicated with pt and nursing.  Will follow  Israella Hubert S. Perlie Gold  [facility] [address] [place], [place] [address] [phone] Cell (Monday-Friday 8 AM - 5 PM) [phone] After 5 PM and follow prompts

## 2016-08-08 ENCOUNTER — Telehealth: Payer: Self-pay | Admitting: Internal Medicine

## 2016-08-08 NOTE — Telephone Encounter (Signed)
Norman Davis called both home number and relative's number to confirm if patient is being seen at [facility] clinic. VDM (DD)

## 2016-12-24 IMAGING — CT CT HEAD W/O CM
3 of 6 series · 14 of 47 positions shown, 16 images · non-contrast
Comparison: None.

CLINICAL DATA: Fall today. No loss of consciousness. History of
hypertension.

EXAM:
CT HEAD WITHOUT CONTRAST
CT CERVICAL SPINE WITHOUT CONTRAST
TECHNIQUE: Multidetector CT imaging of the head and cervical spine was
performed following the standard protocol without intravenous
contrast. Multiplanar CT image reconstructions of the cervical spine
were also generated.

[Series 4: head 2.0 h70h · axial · 0.44mm/px · z∈[-27,+39]mm · 4 of 77 slices shown]
[im 11/77  brain]
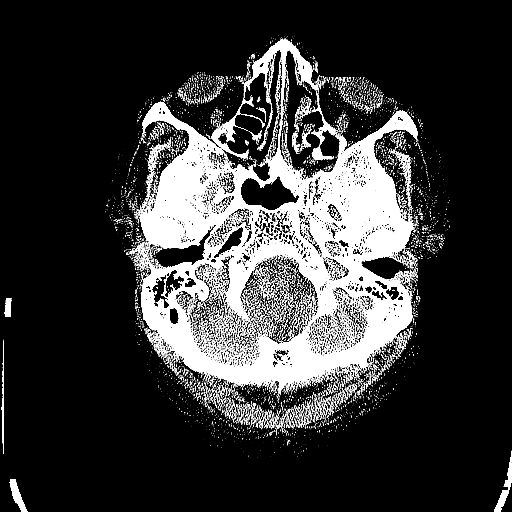
[im 22/77  brain]
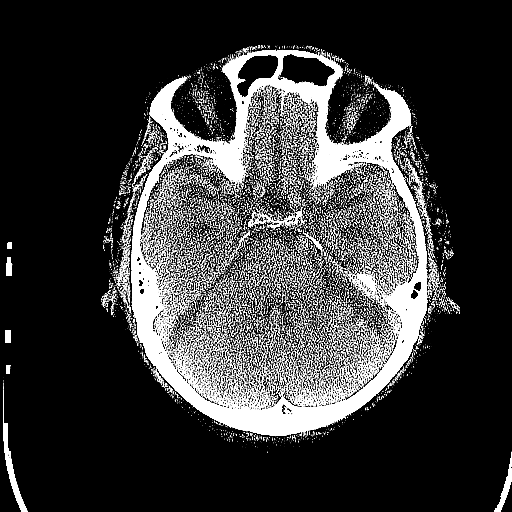
[im 33/77  brain]
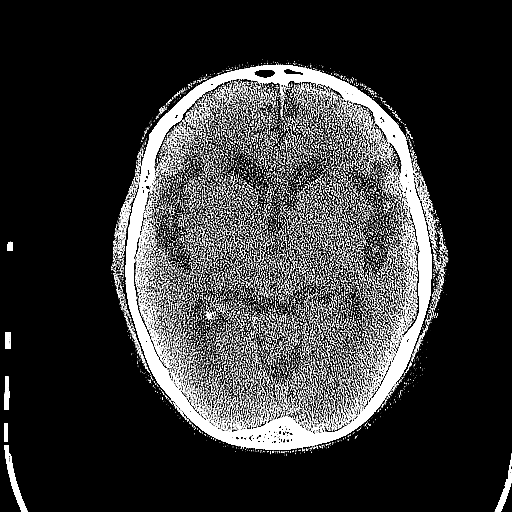
[im 44/77  brain]
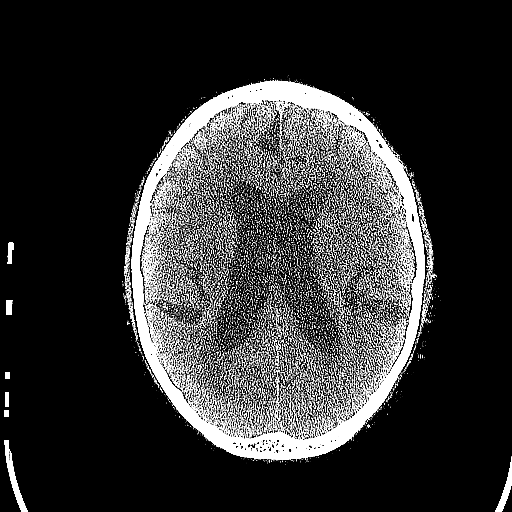

[Series 8: coronals · coronal · 0.18mm/px · 3 of 48 slices shown]
[im 16/48  brain]
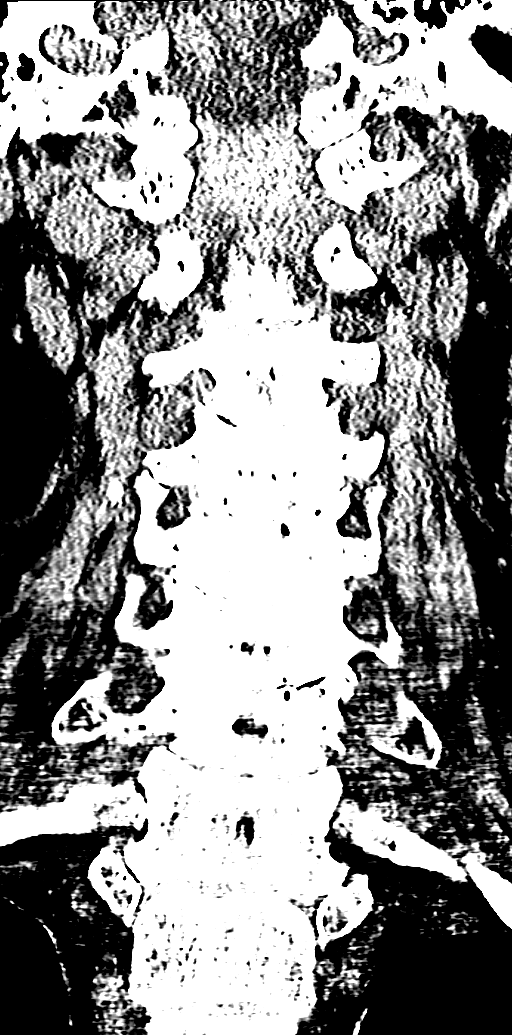
[im 21/48  brain]
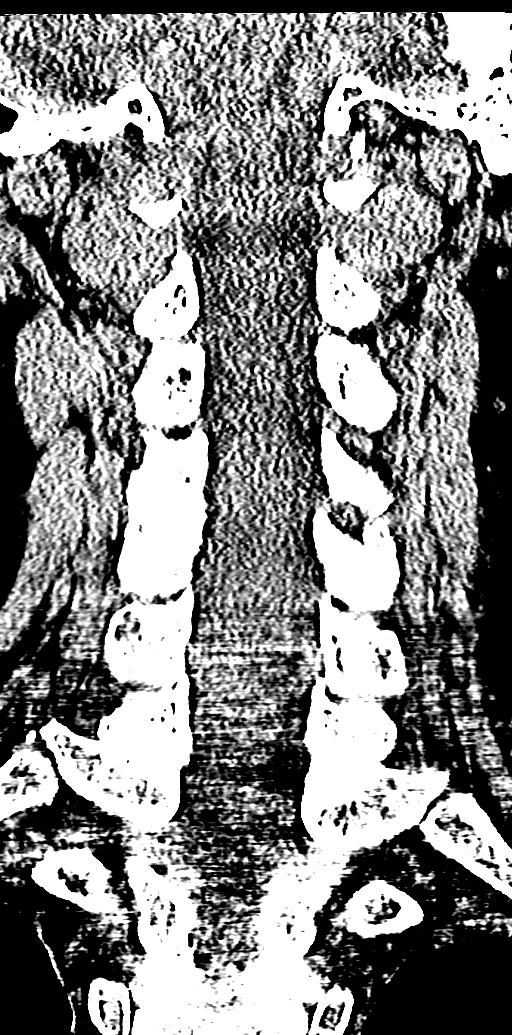
[im 27/48  brain]
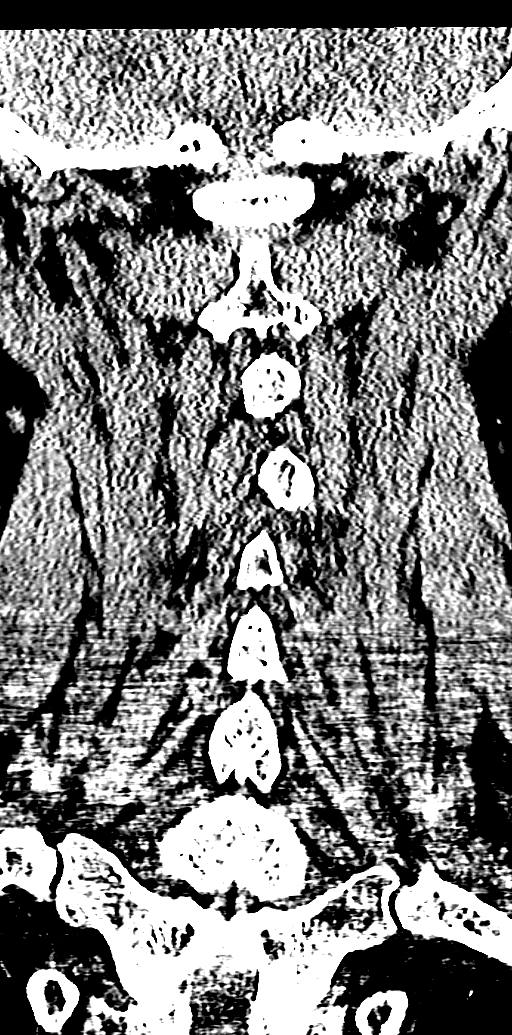

[Series 10: orthogonals · axial · 0.21mm/px · z∈[-210,-79]mm · 7 of 92 slices shown, 9 images]
[im 12/92  brain]
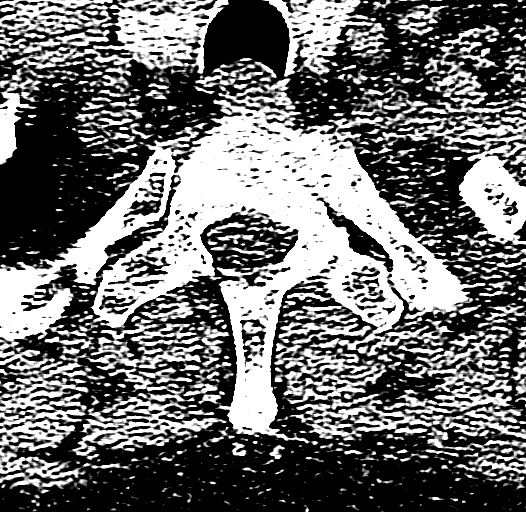
[im 12/92  bone]
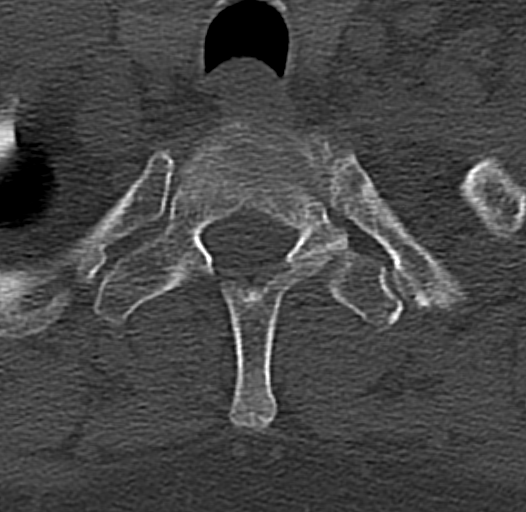
[im 23/92  brain]
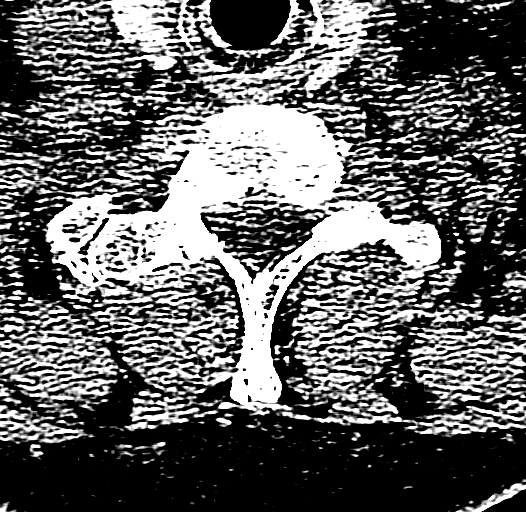
[im 35/92  brain]
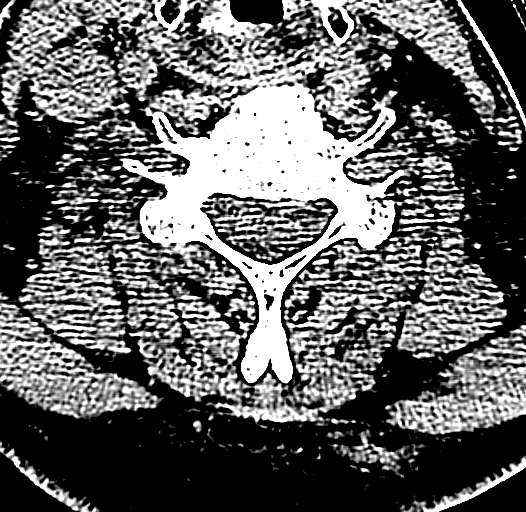
[im 46/92  brain]
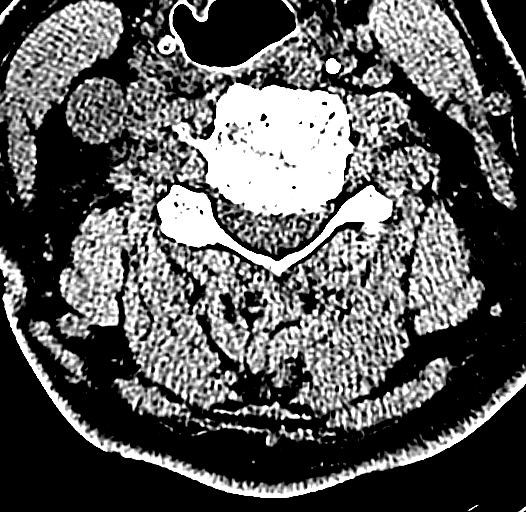
[im 57/92  brain]
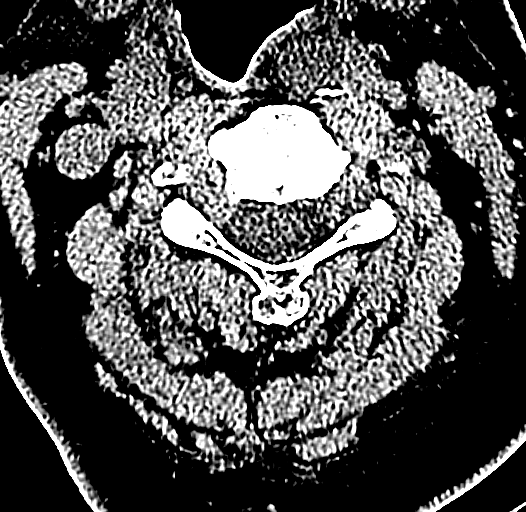
[im 57/92  bone]
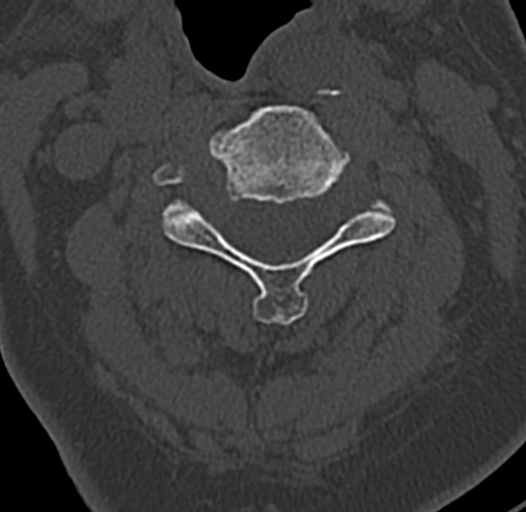
[im 69/92  brain]
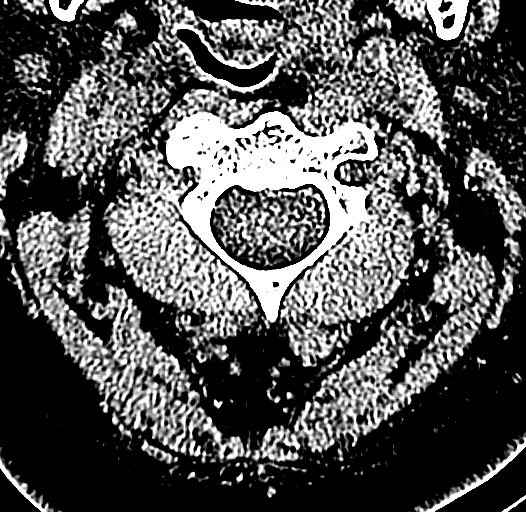
[im 80/92  brain]
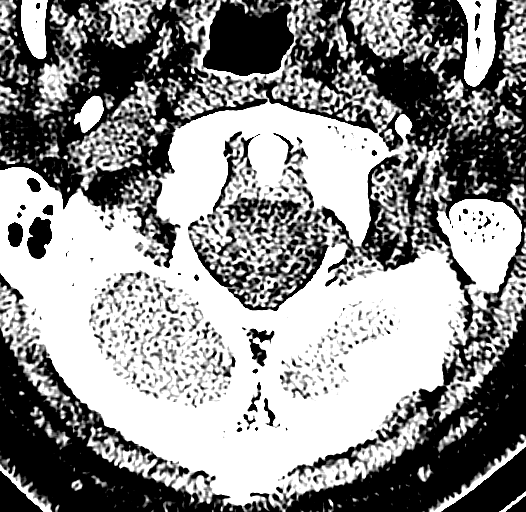

[14 of 47 positions shown; findings below may reference images not displayed]

FINDINGS: CT HEAD FINDINGS

Diffuse cerebral atrophy. Ventricular dilatation consistent with
central atrophy. Low-attenuation changes in the deep white matter
are nonspecific but consistent with small vessel ischemia. No mass
effect or midline shift. No abnormal extra-axial fluid collections.
Gray-white matter junctions are distinct. Basal cisterns are not
effaced. No evidence of acute intracranial hemorrhage. No depressed
skull fractures. Mild mucosal thickening in the paranasal sinuses.
Mastoid air cells are not opacified. Vascular calcifications.

CT CERVICAL SPINE FINDINGS

Straightening of the usual cervical lordosis. This may be due to
patient positioning or degenerative changes but ligamentous injury
or muscle spasm could also have this appearance and are not
excluded. No anterior subluxation. Normal alignment of the facet
joints. Degenerative changes throughout the cervical spine with
narrowed interspaces and associated endplate hypertrophic changes.
No vertebral compression deformities. No prevertebral soft tissue
swelling. C1-2 articulation appears intact. No focal bone lesion or
bone destruction. Soft tissues are unremarkable. Severe bullous
emphysematous changes demonstrated in the lung apices.
IMPRESSION: No acute intracranial abnormalities. Chronic atrophy and small
vessel ischemic changes.

Nonspecific straightening of the usual cervical lordosis. Diffuse
degenerative changes. No acute displaced fractures identified in the
cervical spine.

## 2016-12-25 IMAGING — CR DG CHEST 2V
2 series · 2 of 2 positions shown · non-contrast
Comparison: Radiograph dated 06/12/2015

CLINICAL DATA: 74-year-old male with fall and left hip pain.
Anterior chest wall pain.

EXAM:
CHEST  2 VIEW

[chest lat]
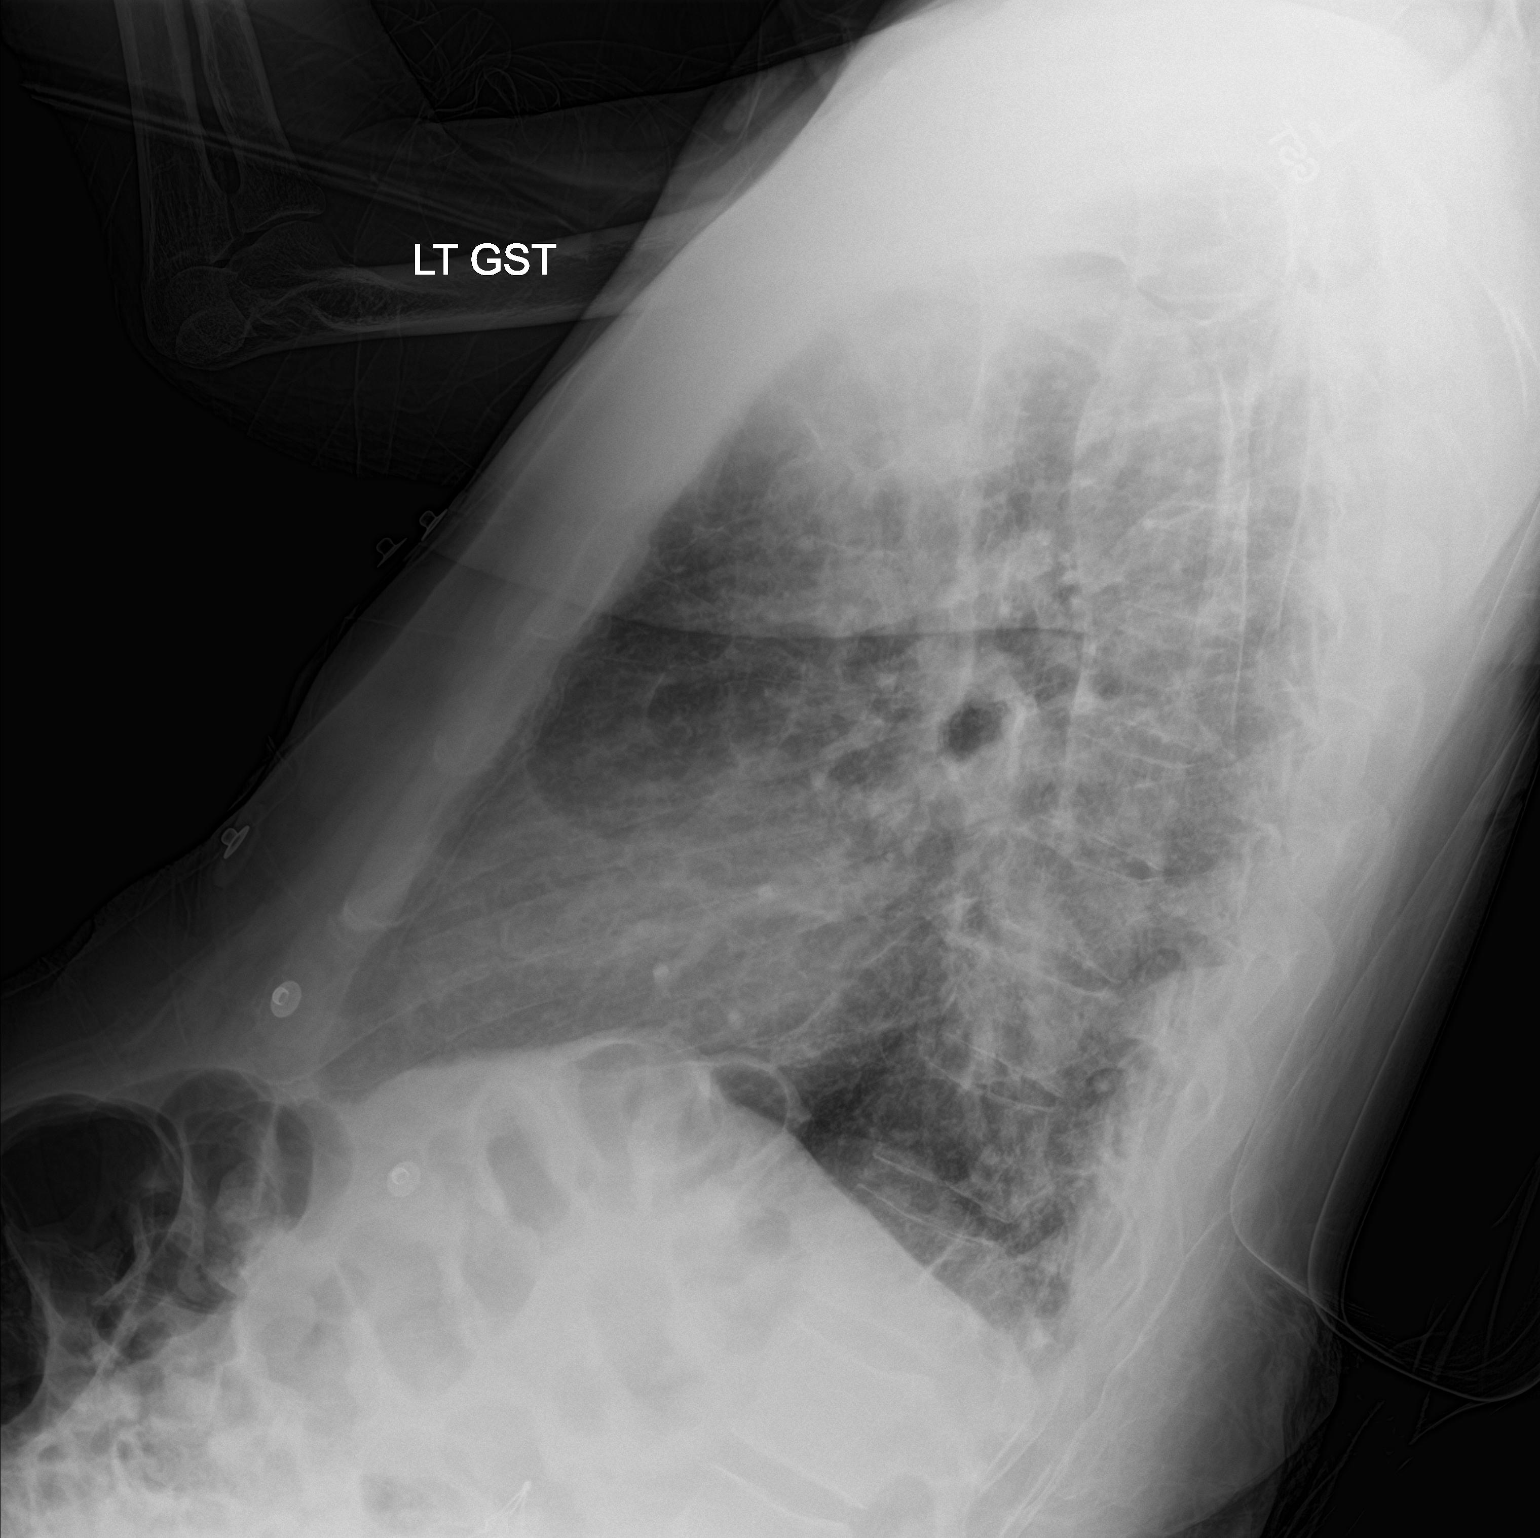

[chest ap]
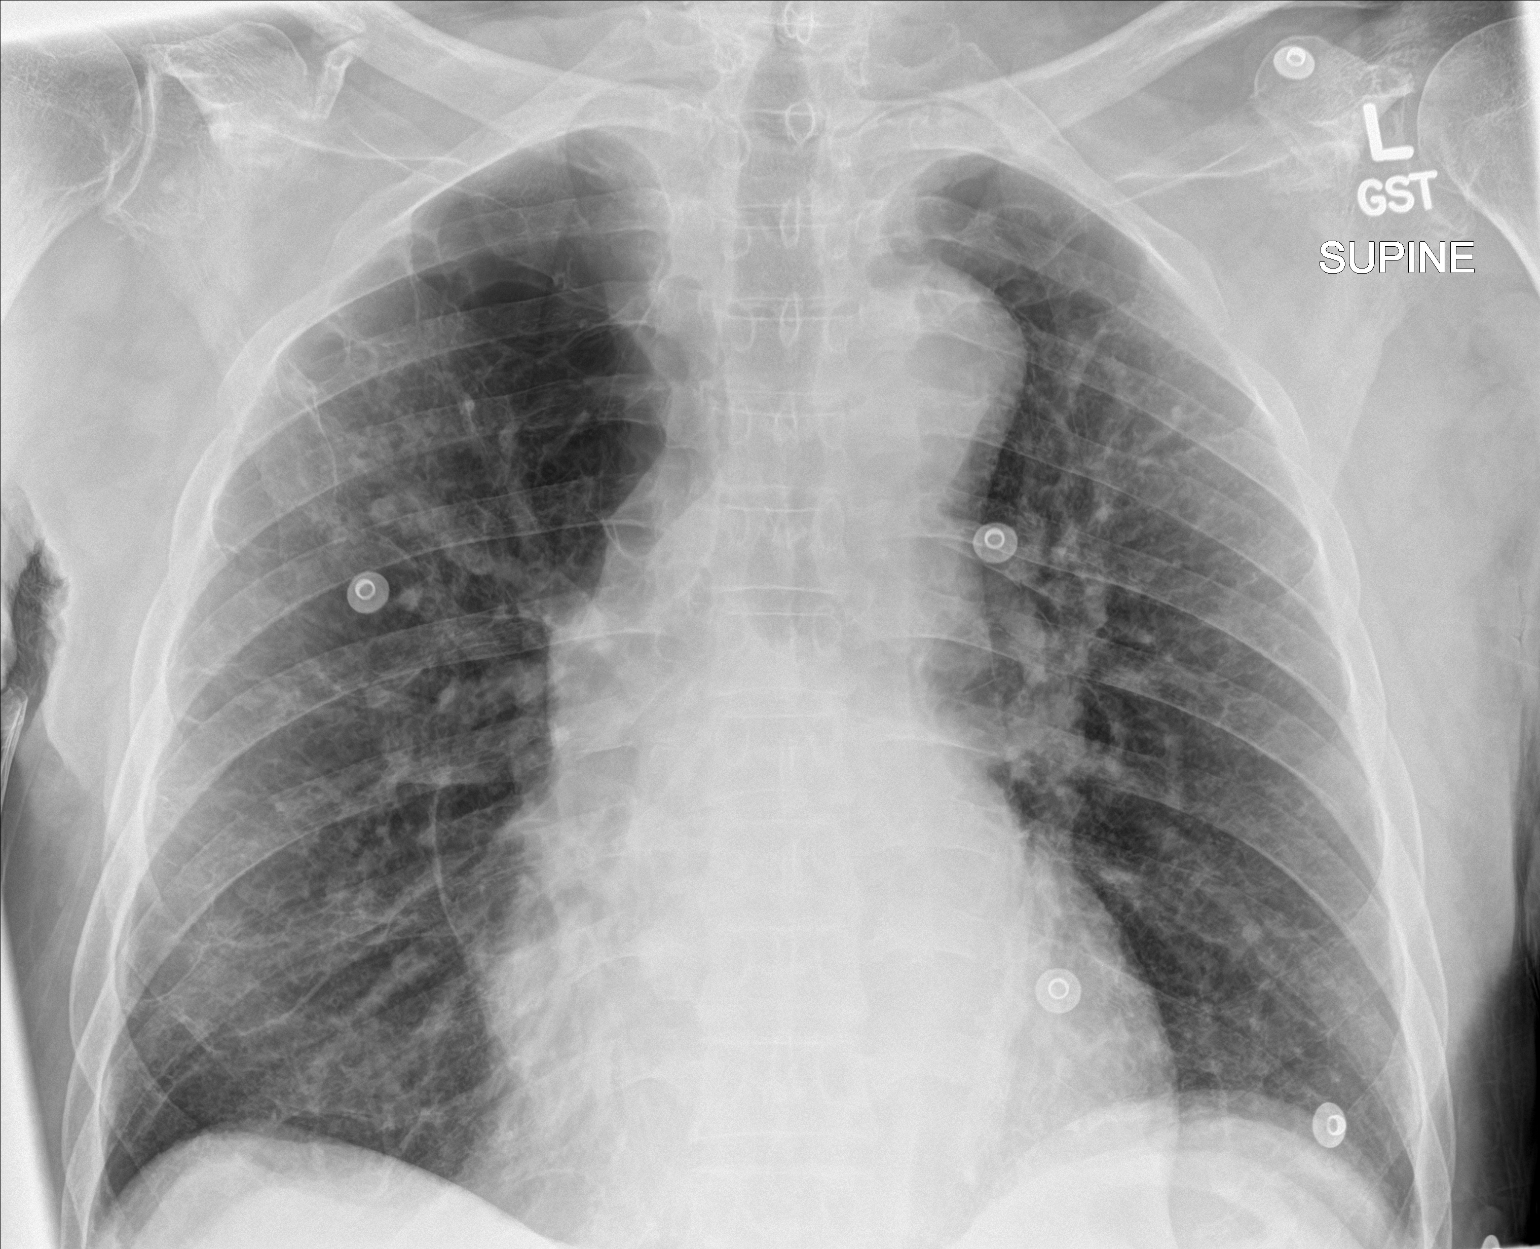

[2 of 2 positions shown; findings below may reference images not displayed]

FINDINGS: Two views of the chest demonstrate emphysematous changes of the
lungs. There is no focal consolidation, pleural effusion, or
pneumothorax. Stable cardiac silhouette. There is degenerative
changes of the spine. No acute fracture.
IMPRESSION: No acute intrathoracic pathology.

Emphysema.

## 2016-12-25 IMAGING — CT CT ABD-PELV W/ CM
2 of 5 series · 15 of 46 positions shown, 17 images · IV contrast (Omni 300)
Comparison: Lumbar spine Radiograph dated 07/21/2015 and CT dated
04/29/2015

CLINICAL DATA: 74-year-old male with fall and anterior chest pain
abdominal pain. Left hip pain.

EXAM:
CT ABDOMEN AND PELVIS WITH CONTRAST
TECHNIQUE: Multidetector CT imaging of the abdomen and pelvis was performed
using the standard protocol following bolus administration of
intravenous contrast.
CONTRAST:  100mL OMNIPAQUE IOHEXOL 300 MG/ML  SOLN

[Series 2: a/p w/ 5mm · axial · 0.81mm/px · z∈[+870,+1264]mm · 12 of 89 slices shown, 14 images]
[im 5/89  soft-tissue]
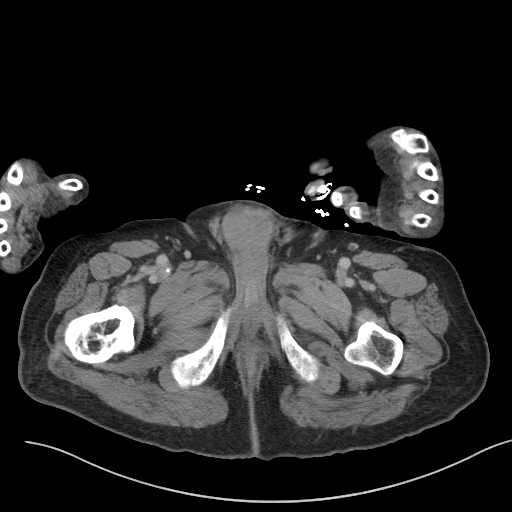
[im 5/89  bone]
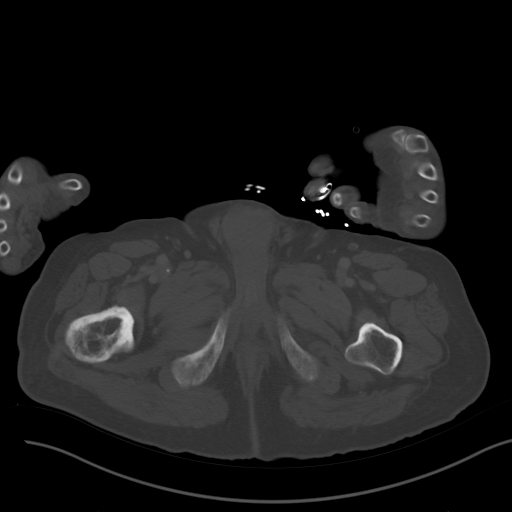
[im 14/89  soft-tissue]
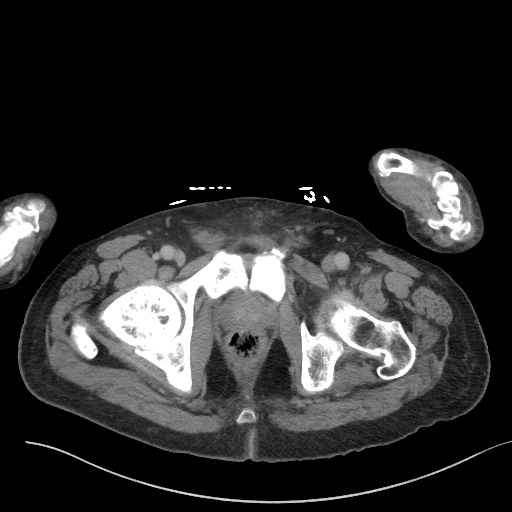
[im 19/89  soft-tissue]
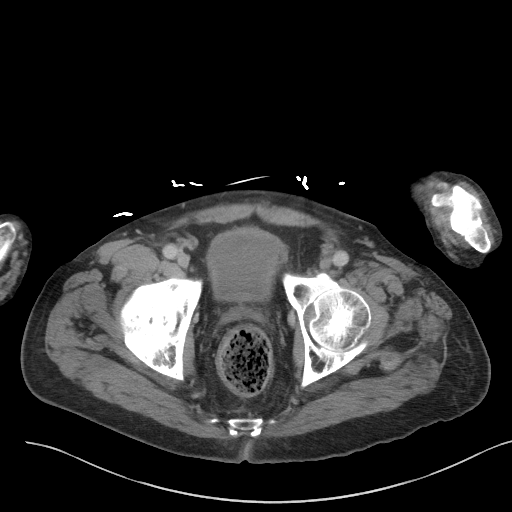
[im 28/89  soft-tissue]
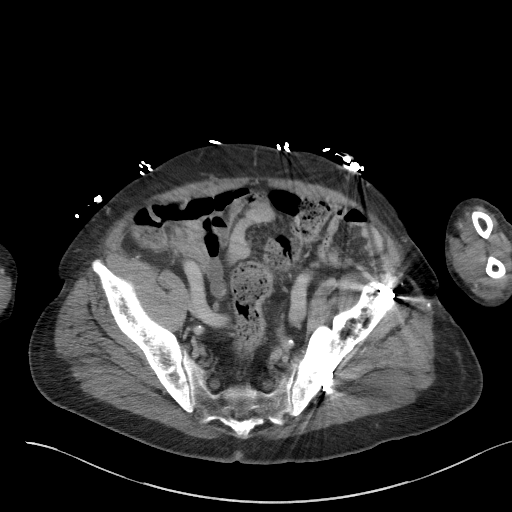
[im 33/89  soft-tissue]
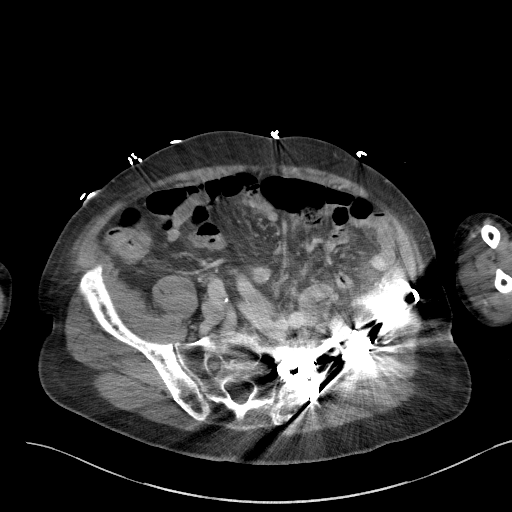
[im 42/89  soft-tissue]
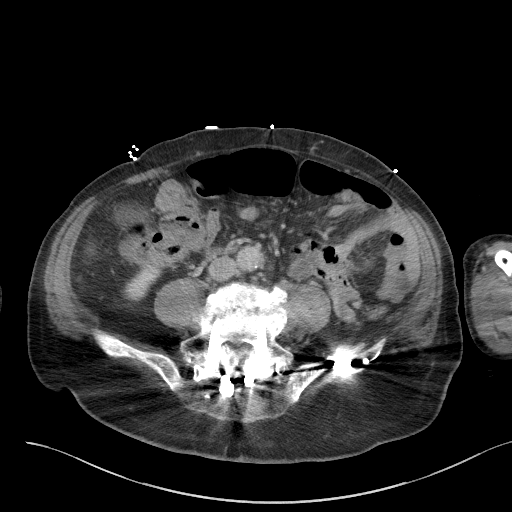
[im 47/89  soft-tissue]
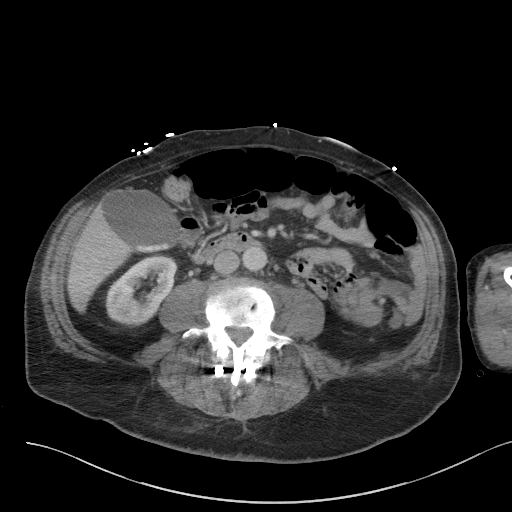
[im 56/89  soft-tissue]
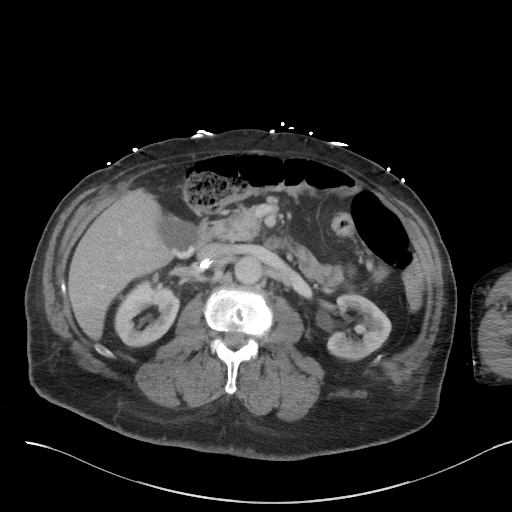
[im 61/89  soft-tissue]
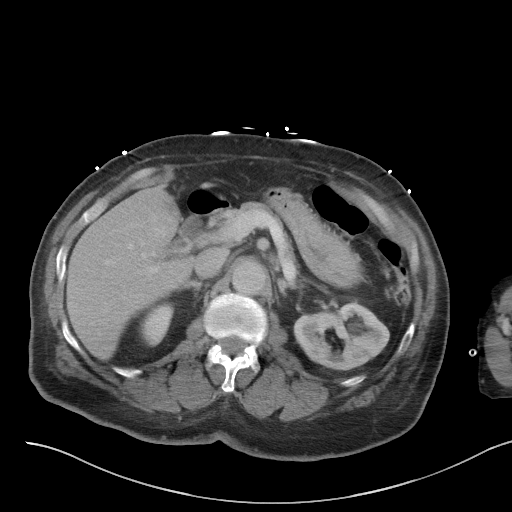
[im 61/89  bone]
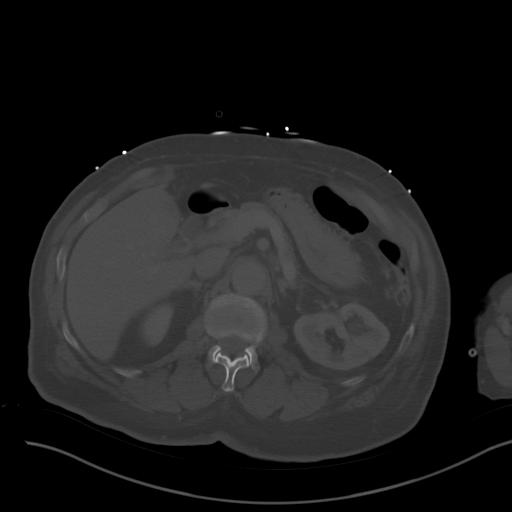
[im 70/89  soft-tissue]
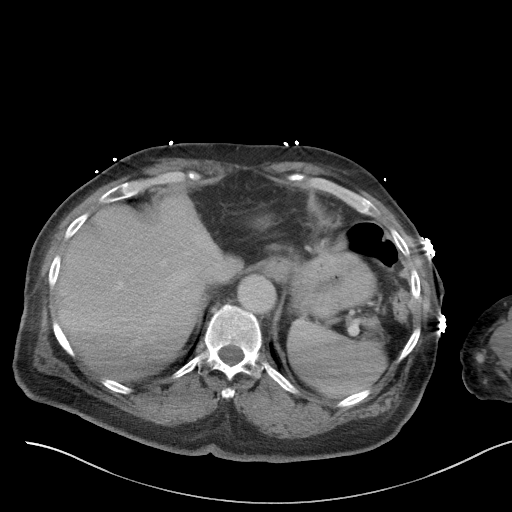
[im 75/89  soft-tissue]
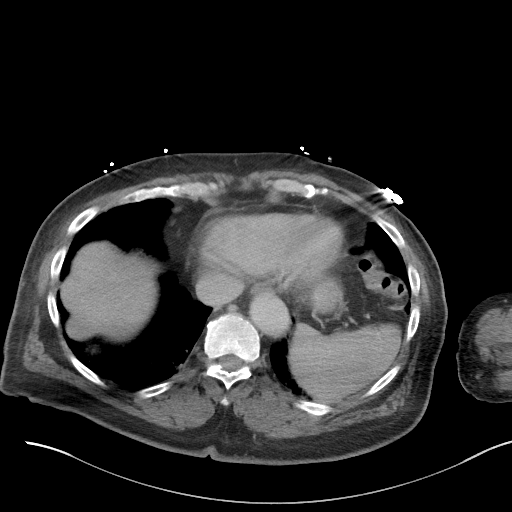
[im 84/89  soft-tissue]
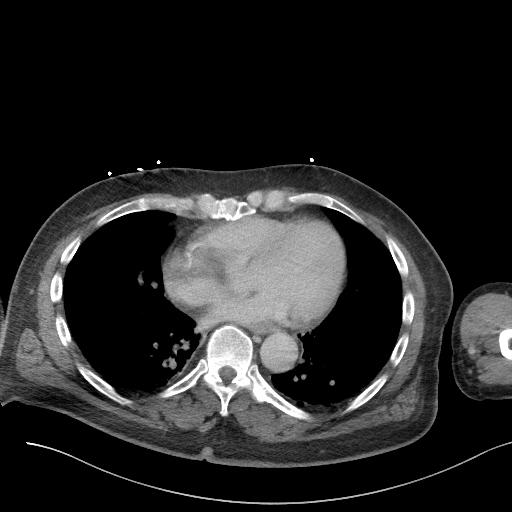

[Series 5: a/p w/ cor · coronal · 0.77mm/px · 3 of 126 slices shown]
[im 42/126  soft-tissue]
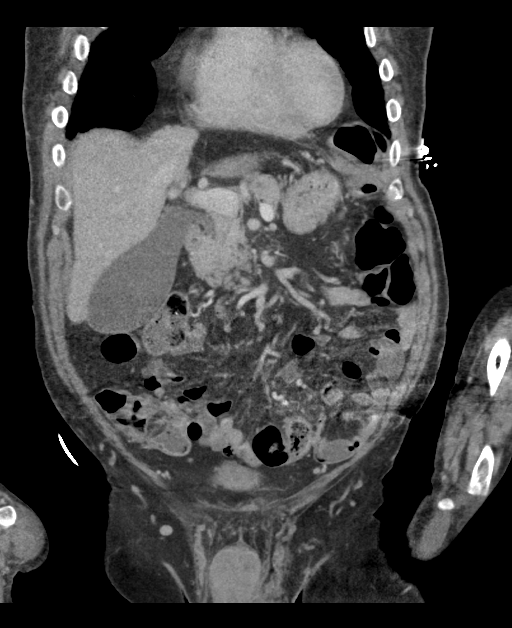
[im 56/126  soft-tissue]
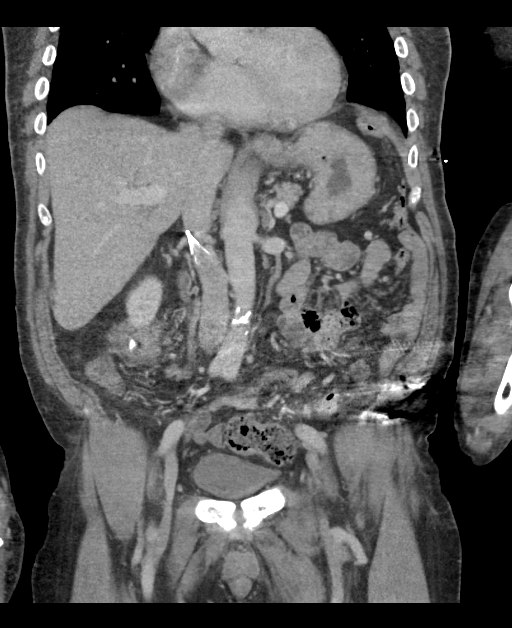
[im 70/126  soft-tissue]
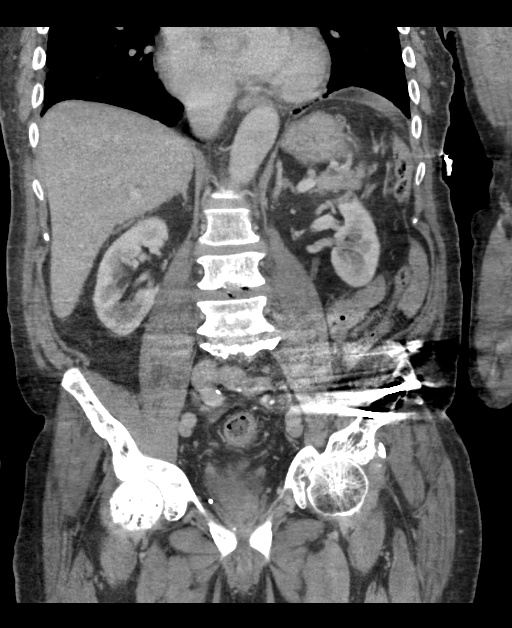

[15 of 46 positions shown; findings below may reference images not displayed]

FINDINGS: There is emphysematous changes of the lung bases. No intra-abdominal
free air or free fluid.

There multiple stones within the gallbladder. No pericholecystic
fluid or evidence of inflammatory changes of the gallbladder by CT.
Ultrasound may provide better evaluation of the gallbladder if
clinically indicated. The liver, pancreas, spleen, adrenal glands
appear unremarkable. The kidneys, visualized ureters appear
unremarkable. The urinary bladder is predominantly collapsed. There
is apparent diffuse thickening of the bladder wall which may be
partly related to underdistention. Cystitis is not excluded.
Correlation with urinalysis recommended.

Evaluation of the bowel is limited in the absence of oral contrast.
There is moderate stool throughout the colon. No evidence of bowel
obstruction or inflammation. There is segmental area of thickening
in the distal aspect of the ascending colon which case related to
underdistention. Underlying lesion is not excluded. Colonoscopy may
provide better evaluation. The appendix is not visualized with
certainty. No inflammatory changes identified in the right lower
quadrant.

The abdominal aorta appears unremarkable. An infrarenal IVC filter
is noted. No portal venous gas identified. There is no adenopathy.
The abdominal wall soft tissues appear unremarkable. There is
chronic sclerotic changes of the pelvic bone with trabecular
thickening, possibly related to underlying Paget's disease. L5-S1
posterior fixation hardware. Multiple fixation screws noted in the
left iliac bone. A stable appearing lucency in the inferior aspect
of the L3 vertebra likely represent a Schmorl's node. No acute
fracture identified.
IMPRESSION: No acute/traumatic intra-abdominal or pelvic pathology.

Stable appearing sclerotic changes of the pelvic bones and right
femur compatible with pagetoid changes. Stable postsurgical changes
of the left iliac bone with multiple fixation screws. No acute0
fracture.

Cholelithiasis.

Segmental thickening of the distal ascending colon possibly related
to underdistention. Underlying lesion is not excluded.

## 2016-12-26 IMAGING — CR DG CHEST 1V PORT
1 series · 1 of 1 positions shown · non-contrast
Comparison: 07/21/2005, 04/29/2015

CLINICAL DATA: Left chest pain and shortness of breath

EXAM:
PORTABLE CHEST 1 VIEW

[AP]
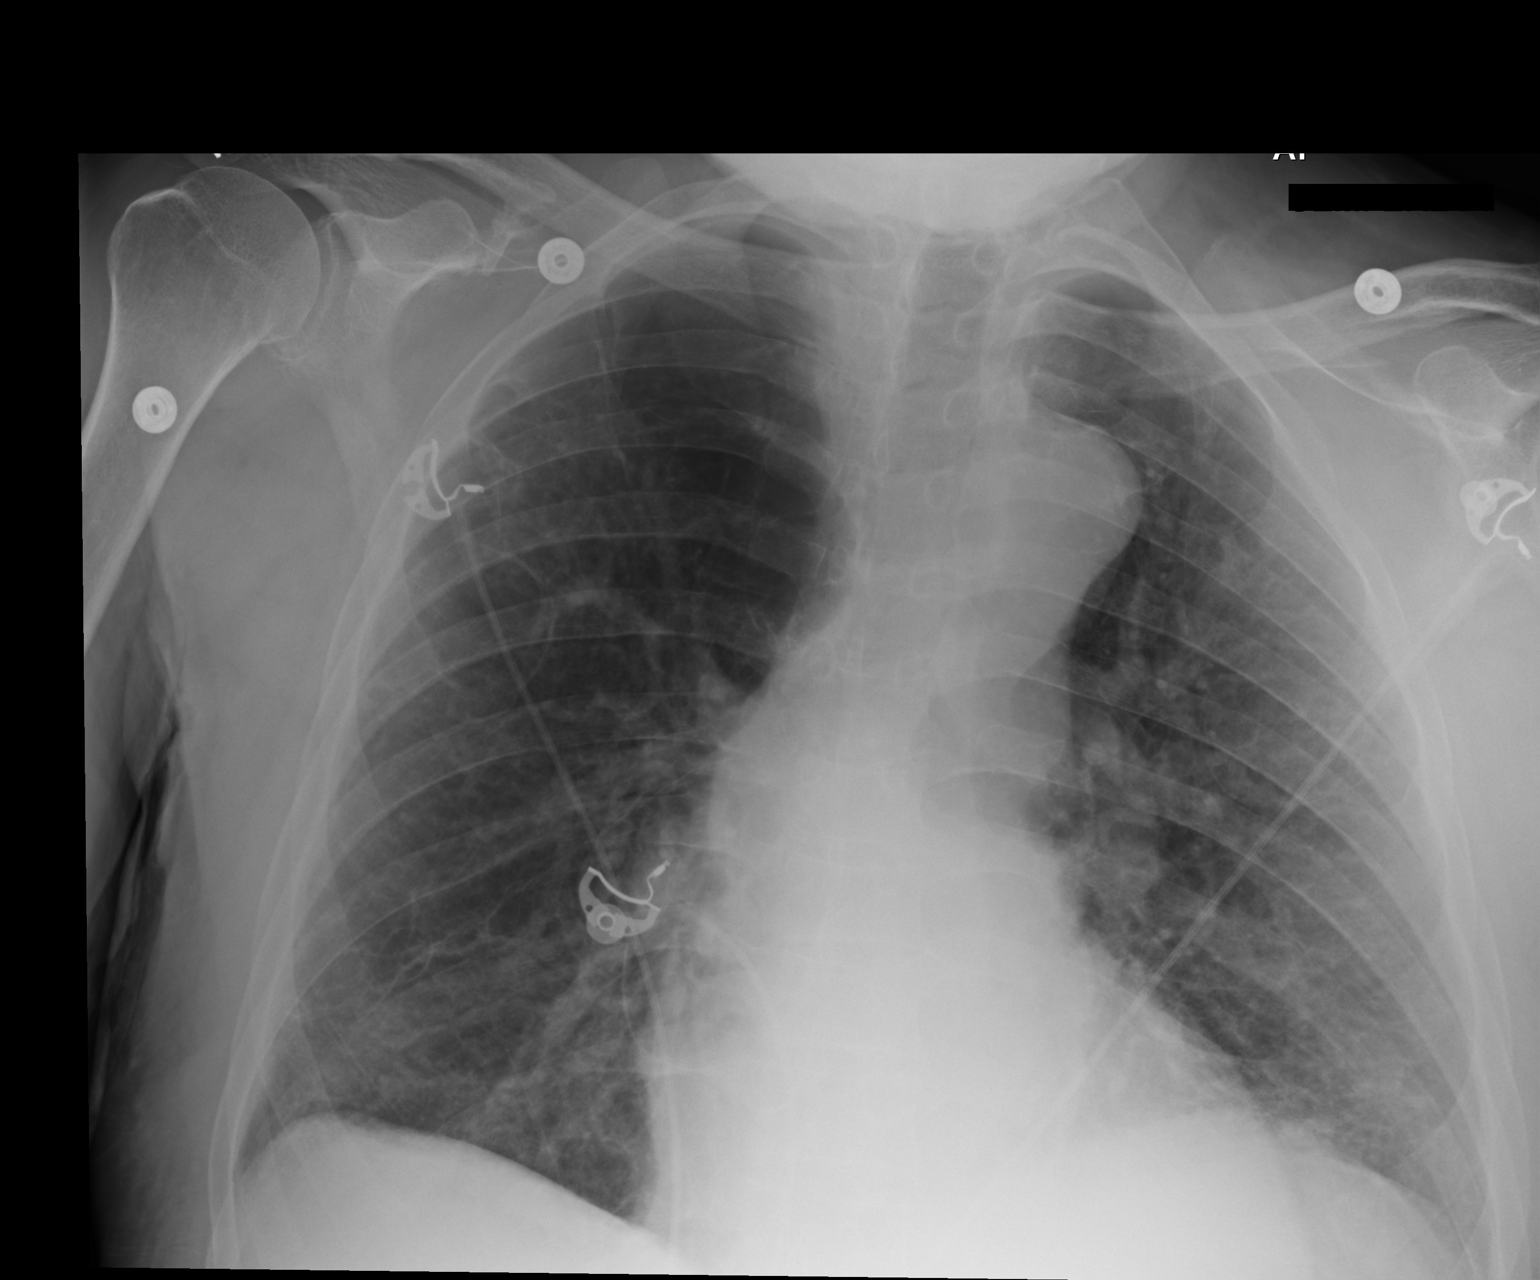

[1 of 1 positions shown; findings below may reference images not displayed]

FINDINGS: Background bullous emphysema noted with right upper lobe bullous
disease. No superimposed pneumonia, collapse or consolidation.
Negative for edema, significant effusion or pneumothorax. Mild
cardiac enlargement with central vascular congestion. Trachea is
midline. Aorta is atherosclerotic and elongated.
IMPRESSION: Background bullous emphysema.  No superimposed acute process

## 2017-04-12 ENCOUNTER — Other Ambulatory Visit: Payer: Self-pay

## 2017-04-12 ENCOUNTER — Emergency Department (HOSPITAL_COMMUNITY): Payer: Medicare Other

## 2017-04-12 ENCOUNTER — Encounter (HOSPITAL_COMMUNITY): Payer: Self-pay

## 2017-04-12 ENCOUNTER — Inpatient Hospital Stay (HOSPITAL_COMMUNITY)
Admission: EM | Admit: 2017-04-12 | Discharge: 2017-04-20 | DRG: 539 | Disposition: A | Discharge status: Skilled Nursing Facility | Payer: Medicare Other | Attending: Internal Medicine | Admitting: Internal Medicine

## 2017-04-12 DIAGNOSIS — R0902 Hypoxemia: Secondary | ICD-10-CM

## 2017-04-12 DIAGNOSIS — R609 Edema, unspecified: Secondary | ICD-10-CM | POA: Diagnosis not present

## 2017-04-12 DIAGNOSIS — M4640 Discitis, unspecified, site unspecified: Secondary | ICD-10-CM | POA: Diagnosis not present

## 2017-04-12 DIAGNOSIS — J449 Chronic obstructive pulmonary disease, unspecified: Secondary | ICD-10-CM | POA: Diagnosis present

## 2017-04-12 DIAGNOSIS — B9561 Methicillin susceptible Staphylococcus aureus infection as the cause of diseases classified elsewhere: Secondary | ICD-10-CM | POA: Diagnosis present

## 2017-04-12 DIAGNOSIS — Z8673 Personal history of transient ischemic attack (TIA), and cerebral infarction without residual deficits: Secondary | ICD-10-CM

## 2017-04-12 DIAGNOSIS — R000 Tachycardia, unspecified: Secondary | ICD-10-CM | POA: Diagnosis not present

## 2017-04-12 DIAGNOSIS — B957 Other staphylococcus as the cause of diseases classified elsewhere: Secondary | ICD-10-CM

## 2017-04-12 DIAGNOSIS — K3189 Other diseases of stomach and duodenum: Secondary | ICD-10-CM

## 2017-04-12 DIAGNOSIS — M4646 Discitis, unspecified, lumbar region: Secondary | ICD-10-CM | POA: Diagnosis present

## 2017-04-12 DIAGNOSIS — E876 Hypokalemia: Secondary | ICD-10-CM | POA: Diagnosis present

## 2017-04-12 DIAGNOSIS — M48062 Spinal stenosis, lumbar region with neurogenic claudication: Secondary | ICD-10-CM | POA: Diagnosis not present

## 2017-04-12 DIAGNOSIS — Z7902 Long term (current) use of antithrombotics/antiplatelets: Secondary | ICD-10-CM

## 2017-04-12 DIAGNOSIS — R1310 Dysphagia, unspecified: Secondary | ICD-10-CM

## 2017-04-12 DIAGNOSIS — F209 Schizophrenia, unspecified: Secondary | ICD-10-CM | POA: Diagnosis present

## 2017-04-12 DIAGNOSIS — J310 Chronic rhinitis: Secondary | ICD-10-CM | POA: Diagnosis present

## 2017-04-12 DIAGNOSIS — E785 Hyperlipidemia, unspecified: Secondary | ICD-10-CM | POA: Diagnosis present

## 2017-04-12 DIAGNOSIS — R410 Disorientation, unspecified: Secondary | ICD-10-CM | POA: Diagnosis not present

## 2017-04-12 DIAGNOSIS — M436 Torticollis: Secondary | ICD-10-CM | POA: Diagnosis present

## 2017-04-12 DIAGNOSIS — M609 Myositis, unspecified: Secondary | ICD-10-CM | POA: Diagnosis present

## 2017-04-12 DIAGNOSIS — I639 Cerebral infarction, unspecified: Secondary | ICD-10-CM | POA: Diagnosis not present

## 2017-04-12 DIAGNOSIS — R5383 Other fatigue: Secondary | ICD-10-CM | POA: Diagnosis not present

## 2017-04-12 DIAGNOSIS — I82612 Acute embolism and thrombosis of superficial veins of left upper extremity: Secondary | ICD-10-CM | POA: Diagnosis present

## 2017-04-12 DIAGNOSIS — N400 Enlarged prostate without lower urinary tract symptoms: Secondary | ICD-10-CM | POA: Diagnosis present

## 2017-04-12 DIAGNOSIS — Z720 Tobacco use: Secondary | ICD-10-CM | POA: Diagnosis not present

## 2017-04-12 DIAGNOSIS — M8608 Acute hematogenous osteomyelitis, other sites: Secondary | ICD-10-CM | POA: Diagnosis not present

## 2017-04-12 DIAGNOSIS — K219 Gastro-esophageal reflux disease without esophagitis: Secondary | ICD-10-CM | POA: Diagnosis present

## 2017-04-12 DIAGNOSIS — Z79899 Other long term (current) drug therapy: Secondary | ICD-10-CM | POA: Diagnosis not present

## 2017-04-12 DIAGNOSIS — Z96649 Presence of unspecified artificial hip joint: Secondary | ICD-10-CM | POA: Diagnosis present

## 2017-04-12 DIAGNOSIS — Z880 Allergy status to penicillin: Secondary | ICD-10-CM

## 2017-04-12 DIAGNOSIS — G9341 Metabolic encephalopathy: Secondary | ICD-10-CM | POA: Diagnosis not present

## 2017-04-12 DIAGNOSIS — G8929 Other chronic pain: Secondary | ICD-10-CM | POA: Diagnosis present

## 2017-04-12 DIAGNOSIS — I10 Essential (primary) hypertension: Secondary | ICD-10-CM | POA: Diagnosis present

## 2017-04-12 DIAGNOSIS — Z886 Allergy status to analgesic agent status: Secondary | ICD-10-CM | POA: Diagnosis not present

## 2017-04-12 DIAGNOSIS — M6008 Infective myositis, other site: Secondary | ICD-10-CM | POA: Diagnosis not present

## 2017-04-12 DIAGNOSIS — M4316 Spondylolisthesis, lumbar region: Secondary | ICD-10-CM | POA: Diagnosis present

## 2017-04-12 DIAGNOSIS — M6282 Rhabdomyolysis: Secondary | ICD-10-CM | POA: Diagnosis present

## 2017-04-12 DIAGNOSIS — M48061 Spinal stenosis, lumbar region without neurogenic claudication: Secondary | ICD-10-CM | POA: Diagnosis present

## 2017-04-12 DIAGNOSIS — Z9889 Other specified postprocedural states: Secondary | ICD-10-CM | POA: Diagnosis not present

## 2017-04-12 DIAGNOSIS — E8809 Other disorders of plasma-protein metabolism, not elsewhere classified: Secondary | ICD-10-CM | POA: Diagnosis present

## 2017-04-12 DIAGNOSIS — R531 Weakness: Secondary | ICD-10-CM | POA: Diagnosis not present

## 2017-04-12 DIAGNOSIS — I2510 Atherosclerotic heart disease of native coronary artery without angina pectoris: Secondary | ICD-10-CM | POA: Diagnosis present

## 2017-04-12 DIAGNOSIS — I5030 Unspecified diastolic (congestive) heart failure: Secondary | ICD-10-CM | POA: Diagnosis not present

## 2017-04-12 DIAGNOSIS — M4626 Osteomyelitis of vertebra, lumbar region: Secondary | ICD-10-CM | POA: Diagnosis present

## 2017-04-12 DIAGNOSIS — R0989 Other specified symptoms and signs involving the circulatory and respiratory systems: Secondary | ICD-10-CM

## 2017-04-12 DIAGNOSIS — Z791 Long term (current) use of non-steroidal anti-inflammatories (NSAID): Secondary | ICD-10-CM

## 2017-04-12 LAB — URINALYSIS, ROUTINE W REFLEX MICROSCOPIC
BACTERIA UA: NONE SEEN
BILIRUBIN URINE: NEGATIVE
Glucose, UA: NEGATIVE mg/dL
Ketones, ur: 20 mg/dL — AB
Leukocytes, UA: NEGATIVE
NITRITE: NEGATIVE
PROTEIN: 30 mg/dL — AB
SQUAMOUS EPITHELIAL / LPF: NONE SEEN
Specific Gravity, Urine: 1.024 (ref 1.005–1.030)
pH: 5 (ref 5.0–8.0)

## 2017-04-12 LAB — CBC WITH DIFFERENTIAL/PLATELET
BASOS PCT: 0 %
Basophils Absolute: 0 10*3/uL (ref 0.0–0.1)
EOS ABS: 0 10*3/uL (ref 0.0–0.7)
Eosinophils Relative: 0 %
HCT: 43.6 % (ref 39.0–52.0)
Hemoglobin: 14.7 g/dL (ref 13.0–17.0)
Lymphocytes Relative: 10 %
Lymphs Abs: 1.1 10*3/uL (ref 0.7–4.0)
MCH: 29.5 pg (ref 26.0–34.0)
MCHC: 33.7 g/dL (ref 30.0–36.0)
MCV: 87.6 fL (ref 78.0–100.0)
MONO ABS: 1.2 10*3/uL — AB (ref 0.1–1.0)
Monocytes Relative: 11 %
Neutro Abs: 8.7 10*3/uL — ABNORMAL HIGH (ref 1.7–7.7)
Neutrophils Relative %: 79 %
Platelets: 182 10*3/uL (ref 150–400)
RBC: 4.98 MIL/uL (ref 4.22–5.81)
RDW: 14.9 % (ref 11.5–15.5)
WBC: 11 10*3/uL — ABNORMAL HIGH (ref 4.0–10.5)

## 2017-04-12 LAB — BASIC METABOLIC PANEL
Anion gap: 10 (ref 5–15)
BUN: 15 mg/dL (ref 6–20)
CALCIUM: 9.4 mg/dL (ref 8.9–10.3)
CO2: 27 mmol/L (ref 22–32)
CREATININE: 0.92 mg/dL (ref 0.61–1.24)
Chloride: 105 mmol/L (ref 101–111)
GFR calc Af Amer: >60 mL/min (ref >60)
GFR calc non Af Amer: >60 mL/min (ref >60)
GLUCOSE: 95 mg/dL (ref 65–99)
Potassium: 2.9 mmol/L — ABNORMAL LOW (ref 3.5–5.1)
Sodium: 142 mmol/L (ref 135–145)

## 2017-04-12 LAB — I-STAT CG4 LACTIC ACID, ED: LACTIC ACID, VENOUS: 0.61 mmol/L (ref 0.5–1.9)

## 2017-04-12 LAB — CK TOTAL AND CKMB (NOT AT ARMC)
CK TOTAL: 3553 U/L — AB (ref 49–397)
CK, MB: 4.4 ng/mL (ref 0.5–5.0)
Relative Index: 0.1 (ref 0.0–2.5)

## 2017-04-12 LAB — D-DIMER, QUANTITATIVE: D-Dimer, Quant: 0.88 ug/mL-FEU — ABNORMAL HIGH (ref 0.00–0.50)

## 2017-04-12 MED ORDER — MAGNESIUM SULFATE 2 GM/50ML IV SOLN
2.0000 g | Freq: Once | INTRAVENOUS | Status: AC
  Administered 2017-04-12: 2 g via INTRAVENOUS
  Filled 2017-04-12: qty 50

## 2017-04-12 MED ORDER — LORAZEPAM 2 MG/ML IJ SOLN
1.0000 mg | Freq: Once | INTRAMUSCULAR | Status: AC
  Administered 2017-04-12: 1 mg via INTRAVENOUS
  Filled 2017-04-12: qty 1

## 2017-04-12 MED ORDER — POTASSIUM CHLORIDE 10 MEQ/100ML IV SOLN
10.0000 meq | Freq: Once | INTRAVENOUS | Status: AC
  Administered 2017-04-12: 10 meq via INTRAVENOUS
  Filled 2017-04-12: qty 100

## 2017-04-12 MED ORDER — GADOBENATE DIMEGLUMINE 529 MG/ML IV SOLN
15.0000 mL | Freq: Once | INTRAVENOUS | Status: AC | PRN
  Administered 2017-04-12: 15 mL via INTRAVENOUS

## 2017-04-12 MED ORDER — MAGNESIUM SULFATE 50 % IJ SOLN: 2.0000 g | Freq: Once | INTRAMUSCULAR | Status: DC

## 2017-04-12 MED ORDER — HYDRALAZINE HCL 20 MG/ML IJ SOLN: 5.0000 mg | Freq: Four times a day (QID) | INTRAMUSCULAR | Status: DC | PRN

## 2017-04-12 MED ORDER — DEXTROSE 5 % IV SOLN
2.0000 g | Freq: Two times a day (BID) | INTRAVENOUS | Status: DC
  Administered 2017-04-13 – 2017-04-15 (×4): 2 g via INTRAVENOUS
  Filled 2017-04-12 (×6): qty 2

## 2017-04-12 MED ORDER — VANCOMYCIN HCL 10 G IV SOLR
1500.0000 mg | Freq: Once | INTRAVENOUS | Status: AC
  Administered 2017-04-12: 1500 mg via INTRAVENOUS
  Filled 2017-04-12: qty 1500

## 2017-04-12 MED ORDER — DEXTROSE 5 % IV SOLN
2.0000 g | Freq: Once | INTRAVENOUS | Status: AC
  Administered 2017-04-12: 2 g via INTRAVENOUS
  Filled 2017-04-12: qty 2

## 2017-04-12 MED ORDER — POTASSIUM CHLORIDE 10 MEQ/100ML IV SOLN
10.0000 meq | INTRAVENOUS | Status: AC
  Administered 2017-04-12 – 2017-04-13 (×3): 10 meq via INTRAVENOUS
  Filled 2017-04-12 (×2): qty 100

## 2017-04-12 MED ORDER — VANCOMYCIN HCL 10 G IV SOLR
1250.0000 mg | INTRAVENOUS | Status: DC
  Administered 2017-04-13 – 2017-04-15 (×3): 1250 mg via INTRAVENOUS
  Filled 2017-04-12 (×5): qty 1250

## 2017-04-12 NOTE — ED Notes (Signed)
Patient transported to MRI 

## 2017-04-12 NOTE — ED Triage Notes (Signed)
EMS reports from home family called for generalized weakness and lack of ambulation x 3 days.Normally uses walker. Hx of COPD, hypertension and chronic back pain  BP 182/127 HR 101 RR 16 CBG 145
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
## 2017-04-12 NOTE — H&P (Addendum)
TRH H&P   Patient Demographics:    Norman Davis, is a 76 y.o. male  MRN: [id number]   DOB - July 11, 1940  Admit Date - 04/12/2017  Outpatient Primary MD for the patient is Gildardo Cranker, DO  Referring MD/NP/PA: Tanna Furry  Outpatient Specialists:  Dr. Maryjean Ka (pain management)  Patient coming from:   home  Chief Complaint  Patient presents with  . Weakness      HPI:    Norman Davis  is a 77 y.o. male, w hx of Schizophrenia, Hypertension, Hyperlipidemia, Pagets, Norman Davis, Spinal stenosis, prior back surgery in remote past in [place] and ESI several months ago by Dr. Maryjean Ka presents w c/o inability to walk starting on Monday. He has been afebrile per his sister.  His nurse called EMS today due to his inability to walk. .  Pt was brought to ED for evaluation.   In ED  MRI L spine posterior decompression. No definite impingement.  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 Paravertebral phlegmon extends to both psoas muscles. No discrete epidural abscess. Central protrusion in conjunction with posterior element hypertrophy results in severe stenosis affecting the L3 and L4 nerve roots.  Stable 5 mm anterolisthesis at L4-5 with chronic loss of disc height. Posterior element hypertrophy contributes to moderate to severe stenosis with BILATERAL L4 and L5 nerve root impingement.  Wbc 11.0  Hgb 14.7, Plt 182 Na 142, K 2.9 Glucose 95 Bun 15, Creatinine 0.92   Pt will be admitted for discitis/ osteomyelitis.        Review of systems:    In addition to the HPI above,   Pt is a poor historian  Most of history garnered from his sister and ED.   No Fever-chills, No Headache, No changes with Vision or hearing, No problems swallowing food or Liquids, No Chest pain, Cough or Shortness of Breath, No  Abdominal pain, No Nausea or Vommitting, Bowel movements are regular, No Blood in stool or Urine, No dysuria, No new skin rashes or bruises,   No recent weight gain or loss, No polyuria, polydypsia or polyphagia, No significant Mental Stressors.  A full 10 point Review of Systems was done, except as stated above, all other Review of Systems were negative.   With Past History of the following :    Past Medical History:  Diagnosis Date  . Cholelithiasis   . Fall 07/22/2015  . GERD (gastroesophageal reflux disease)   . H/O urinary retention   . Hypercholesteremia   . Hypertension   . Nephrolithiasis    right  . 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 Schizophrenia ([place])   . Shortness of breath dyspnea   . Small bowel obstruction due to adhesions [facility])       Past Surgical History:  Procedure Laterality Date  .  BACK SURGERY    . LIVER SURGERY    . PELVIC FRACTURE SURGERY    . TOTAL HIP ARTHROPLASTY        Social History:     Social History   Tobacco Use  . Smoking status: Former Smoker    Packs/day: 1.00    Types: Cigarettes    Last attempt to quit: 05/02/2000    Years since quitting: 16.9  . Smokeless tobacco: Never Used  Substance Use Topics  . Alcohol use: No     Lives - at home  Mobility - not walking  Family History :     Family History  Problem Relation Age of Onset  . Cancer Mother   . Cancer Father       Home Medications:   Prior to Admission medications   Medication Sig Start Date End Date Taking? Authorizing Provider  Cholecalciferol (VITAMIN D) 2000 units tablet Take 2,000 Units by mouth daily.   Yes [provider]  clopidogrel (PLAVIX) 75 MG tablet Take 75 mg by mouth daily with breakfast.   Yes [provider]  doxazosin (CARDURA) 8 MG tablet Take 8 mg by mouth at bedtime.   Yes [provider]  ENSURE PLUS (ENSURE PLUS) LIQD Take 237 mLs by mouth 3 (three) times daily between meals.   Yes  [provider]  finasteride (PROSCAR) 5 MG tablet Take 5 mg by mouth daily.   Yes [provider]  FLUoxetine (PROZAC) 20 MG capsule Take 40 mg by mouth daily.    Yes [provider]  fluticasone (FLONASE) 50 MCG/ACT nasal spray Place 2 sprays into both nostrils at bedtime.   Yes [provider]  folic acid (FOLVITE) 1 MG tablet Take 1 mg by mouth daily.   Yes [provider]  lidocaine (LIDODERM) 5 % Place 1 patch onto the skin every 12 (twelve) hours. Remove & Discard patch within 12 hours or as directed by MD   Yes [provider]  losartan (COZAAR) 50 MG tablet Take 25 mg by mouth daily.   Yes [provider]  Melatonin 3 MG CAPS Take 1 capsule by mouth at bedtime.   Yes [provider]  Mirabegron (MYRBETRIQ PO) Take 25 mg by mouth daily.    Yes [provider]  Paliperidone Palmitate 546 MG/1.75ML SUSP Inject 546 mg into the muscle every 3 (three) months.   Yes [provider]  pantoprazole (PROTONIX) 40 MG tablet Take 40 mg by mouth daily.   Yes [provider]  polyethylene glycol (MIRALAX / GLYCOLAX) packet Take 17 g by mouth daily as needed for moderate constipation.   Yes [provider]  pravastatin (PRAVACHOL) 40 MG tablet Take 40 mg by mouth at bedtime.   Yes [provider]  senna (SENOKOT) 8.6 MG TABS tablet Take 2 tablets by mouth 2 (two) times daily.   Yes [provider]  SPIRIVA HANDIHALER 18 MCG inhalation capsule INHALE 1 PUFF VIA HANDIHALER EVERY FOR COPD 08/21/15  Yes Hennie Duos, MD  acetaminophen (TYLENOL) 500 MG tablet Take 1,000 mg by mouth every 6 (six) hours as needed for mild pain.    [provider]  albuterol (PROVENTIL HFA;VENTOLIN HFA) 108 (90 BASE) MCG/ACT inhaler Inhale 2 puffs into the lungs every 4 (four) hours as needed for wheezing or shortness of breath.    [provider]  amLODipine (NORVASC) 10 MG tablet  Take 10 mg by mouth daily.    [provider]  cetirizine (ZYRTEC) 10 MG tablet Take 10 mg by mouth at bedtime.     [provider]  gabapentin (NEURONTIN) 100 MG capsule Take 100 mg by mouth at bedtime.    [provider]  HYDROcodone-acetaminophen (NORCO/VICODIN) 5-325 MG tablet Take 2 tablets by mouth every 4 (four) hours as needed for severe pain. Patient not taking: Reported on 04/12/2017 09/17/15   Frederica Kuster, PA-C  meloxicam (MOBIC) 7.5 MG tablet Take 7.5 mg by mouth daily.    [provider]  mirtazapine (REMERON) 30 MG tablet Take 30 mg by mouth at bedtime.    [provider]  oxyCODONE-acetaminophen (PERCOCET/ROXICET) 5-325 MG tablet Take 1 tablet by mouth every 4 (four) hours as needed for moderate pain. Patient not taking: Reported on 04/12/2017 07/25/15   Caren Griffins, MD  paliperidone (INVEGA SUSTENNA) 156 MG/ML SUSP injection Inject 156 mg into the muscle every 28 (twenty-eight) days.    [provider]  pravastatin (PRAVACHOL) 40 MG tablet Take 40 mg by mouth at bedtime.     [provider]  senna (SENOKOT) 8.6 MG TABS tablet Take 1 tablet by mouth 2 (two) times daily.     [provider]     Allergies:     Allergies  Allergen Reactions  . Aspirin     unknown  . Penicillins     Unknown Has patient had a PCN reaction causing immediate rash, facial/tongue/throat swelling, SOB or lightheadedness with hypotension: NO Has patient had a PCN reaction causing severe rash involving mucus membranes or skin necrosis: NO Has patient had a PCN reaction that required hospitalization NO Has patient had a PCN reaction occurring within the last 10 years: NO If all of the above answers are "NO", then may proceed with Cephalosporin use.     Physical Exam:   Vitals  Blood pressure (!) 159/101, pulse (!) 104, temperature 98.4 F (36.9 C), temperature source Oral, resp. rate 16, height _0  (1.753 m), weight  74.8 kg (165 lb), SpO2 100 %.   1. General  lying in bed in NAD,   2. Normal affect and insight, Not Suicidal or Homicidal, Awake Alert, Oriented X 1.  3. No F.N deficits, ALL C.Nerves Intact, pt doesn't appear to be able to follow commands .  Does move right toes to stimuli,  Doesn't much move his left foot or toes.  Great toe downgoing on the right and equivocal on the left.   4. Ears and Eyes appear Normal, Conjunctivae clear, PERRLA. Moist Oral Mucosa.  5. Supple Neck, No JVD, No cervical lymphadenopathy appriciated, No Carotid Bruits.  6. Symmetrical Chest wall movement, Good air movement bilaterally, CTAB.  7. RRR, No Gallops, Rubs or Murmurs, No Parasternal Heave.  8. Positive Bowel Sounds, Abdomen Soft, No tenderness, No organomegaly appriciated,No rebound -guarding or rigidity.  9.  No Cyanosis, Normal Skin Turgor, No Skin Rash or Bruise.  10. Good muscle tone,  joints appear normal , no effusions, Normal ROM.  11. No Palpable Lymph Nodes in Neck or Axillae  No splinter, no janeway, no osler.    Data Review:    CBC Recent Labs  Lab 04/12/17 1440  WBC 11.0*  HGB 14.7  HCT 43.6  PLT 182  MCV 87.6  MCH 29.5  MCHC 33.7  RDW 14.9  LYMPHSABS 1.1  MONOABS 1.2*  EOSABS 0.0  BASOSABS 0.0   ------------------------------------------------------------------------------------------------------------------  Chemistries  Recent Labs  Lab 04/12/17 1440  NA 142  K  2.9*  CL 105  CO2 27  GLUCOSE 95  BUN 15  CREATININE 0.92  CALCIUM 9.4   ------------------------------------------------------------------------------------------------------------------ estimated creatinine clearance is 66.2 mL/min (by C-G formula based on SCr of 0.92 mg/dL). ------------------------------------------------------------------------------------------------------------------ No results for input(s): TSH, T4TOTAL, T3FREE, THYROIDAB in the last 72 hours.  Invalid input(s):  FREET3  Coagulation profile No results for input(s): INR, PROTIME in the last 168 hours. ------------------------------------------------------------------------------------------------------------------- No results for input(s): DDIMER in the last 72 hours. -------------------------------------------------------------------------------------------------------------------  Cardiac Enzymes No results for input(s): CKMB, TROPONINI, MYOGLOBIN in the last 168 hours.  Invalid input(s): CK ------------------------------------------------------------------------------------------------------------------    Component Value Date/Time   BNP 23.1 04/28/2015 1838     ---------------------------------------------------------------------------------------------------------------  Urinalysis    Component Value Date/Time   COLORURINE YELLOW 04/12/2017 1443   APPEARANCEUR CLEAR 04/12/2017 1443   LABSPEC 1.024 04/12/2017 1443   PHURINE 5.0 04/12/2017 1443   GLUCOSEU NEGATIVE 04/12/2017 1443   HGBUR SMALL (A) 04/12/2017 1443   BILIRUBINUR NEGATIVE 04/12/2017 1443   KETONESUR 20 (A) 04/12/2017 1443   PROTEINUR 30 (A) 04/12/2017 1443   UROBILINOGEN 1.0 03/16/2013 1602   NITRITE NEGATIVE 04/12/2017 1443   LEUKOCYTESUR NEGATIVE 04/12/2017 1443    ----------------------------------------------------------------------------------------------------------------   Imaging Results:    Dg Ribs Unilateral W/chest Right  Result Date: 04/12/2017 CLINICAL DATA:  Generalized weakness. Fell 2 days ago. RIGHT-sided rib pain. EXAM: RIGHT RIBS AND CHEST - 3+ VIEW COMPARISON:  Chest radiograph 07/29/2015. FINDINGS: No fracture or other bone lesions are seen involving the ribs. There is no evidence of pneumothorax or pleural effusion. 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 Apical bullous change is prominent. No consolidation or edema.  Cardiac silhouette normal sized. Tortuosity of the aorta with calcification, consistent with atherosclerosis. IMPRESSION: Changes of COPD.  No acute consolidation or edema. No rib fractures are identified.  There is no pneumothorax. Electronically Signed   By: Staci Righter M.D.   On: 04/12/2017 16:00   Dg Lumbar Spine Complete  Result Date: 04/12/2017 CLINICAL DATA:  76 y/o male with generalize weakness and not ambulating for 3 days. Recent fall. Low back pain, bilateral hip pain. Prior surgery. EXAM: LUMBAR SPINE - COMPLETE 4+ VIEW COMPARISON:  Lumbar radiographs 09/03/2015. CT Abdomen and Pelvis 07/22/2015. Lumbar MRI 04/28/2015. FINDINGS: Chronic postoperative changes to the left iliac wing, left SI joint, and L5-S1 spinal levels. Chronic IVC filter. Chronic degenerative appearing sclerosis of the L3 and L4 vertebral bodies, but associated irregular endplate erosion at F8-H8 is new since the 2017 radiographs (image 5). Other lumbar levels appear stable. Visible lower thoracic levels appears stable. IMPRESSION: 1. Chronic L3 and L4 vertebral body sclerosis with erosion of the endplates since May 2993. While this could be degenerative in nature, consider Discitis Osteomyelitis. Lumbar MRI without and with contrast would best evaluate further. 2. Stable postoperative appearance of L5-S1 and the left hemipelvis. 3. Note that prior lumbar MRI was in 2016, and chronic spinal and pelvic hardware plus IVC filter are unchanged since 2014. Electronically Signed   By: Genevie Ann M.D.   On: 04/12/2017 16:02   Ct Head Wo Contrast  Result Date: 04/12/2017 CLINICAL DATA:  Generalized weakness EXAM: CT HEAD WITHOUT CONTRAST TECHNIQUE: Contiguous axial images were obtained from the base of the skull through the vertex without intravenous contrast. COMPARISON:  07/22/2015 FINDINGS: Brain: Diffuse atrophic and chronic white matter ischemic change is seen. No findings to suggest acute hemorrhage, acute infarction or  space-occupying mass lesion  are noted. Vascular: No hyperdense vessel or unexpected calcification. Skull: Normal. Negative for fracture or focal lesion. Sinuses/Orbits: No acute finding. Other: None IMPRESSION: Chronic atrophic and ischemic changes. No acute abnormality is noted. Electronically Signed   By: Inez Catalina M.D.   On: 04/12/2017 16:34   Mr Lumbar Spine W Wo Contrast  Result Date: 04/12/2017 CLINICAL DATA:  Suspected osteomyelitis. Previous lumbar surgery. Back pain. Unable to bear weight. EXAM: MRI LUMBAR SPINE WITHOUT AND WITH CONTRAST TECHNIQUE: Multiplanar and multiecho pulse sequences of the lumbar spine were obtained without and with intravenous contrast. CONTRAST:  60m MULTIHANCE GADOBENATE DIMEGLUMINE 529 MG/ML IV SOLN COMPARISON:  Plain films earlier today. MRI lumbar spine 04/28/2015. FINDINGS: Segmentation:  Standard. Alignment: Degenerative anterolisthesis L4 on L5, 5 mm, stable from 2016. Vertebrae: Fatty replaced L5 and S1 vertebral bodies, appears similar to priors. Susceptibility artifact due to pedicle screws at L5 and S1 limits further assessment. Bone marrow edema of an acute nature at L3 and L4, related to L3-4 discal hyperintensity. Abnormal enhancement is observed on postcontrast images. L3 and L4 osteomyelitis is likely, given the plain film appearance. Conus medullaris and cauda equina: Conus extends to the L1-L2 level. Conus appears normal. Clumping of lower lumbar nerve roots could represent arachnoiditis. No intradural enhancement. Paraspinal and other soft tissues: Paravertebral soft tissue phlegmon extends into both psoas muscles. Disc levels: L1-L2:  Disc desiccation.  Facet arthropathy.  No impingement. L2-L3: Loss of disc height and signal. Shallow central protrusion. Schmorl's nodes. Posterior element hypertrophy. Mild stenosis. No definite impingement. L3-L4: Loss of interspace height, with abnormal T2 hyperintensity centrally accompanied by postcontrast  enhancement, consistent with L3-L4 diskitis. Posterior protrusion of disc material. Facet hypertrophy and ligamentum flavum overgrowth contribute to severe stenosis with BILATERAL L3 and L4 nerve root impingement. Ventral and dorsal dural enhancement, and enhancement of the ligamentum flava, but no discrete epidural abscess. L4-L5: 5 mm anterolisthesis. Chronic loss of disc height. Uncovering of the disc without protrusion. Posterior element hypertrophy. Moderate to severe stenosis. BILATERAL L4 and L5 nerve root impingement. L5-S1: Apparent solid interbody and posterior arthrodesis. Adequate posterior decompression. No definite impingement. 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 Paravertebral phlegmon extends to both psoas muscles. No discrete epidural abscess. Central protrusion in conjunction with posterior element hypertrophy results in severe stenosis affecting the L3 and L4 nerve roots. Stable 5 mm anterolisthesis at L4-5 with chronic loss of disc height. Posterior element hypertrophy contributes to moderate to severe stenosis with BILATERAL L4 and L5 nerve root impingement. Electronically Signed   By: JStaci RighterM.D.   On: 04/12/2017 18:44   Dg Hips Bilat W Or Wo Pelvis 3-4 Views  Result Date: 04/12/2017 CLINICAL DATA:  Generalized weakness. EXAM: DG HIP (WITH OR WITHOUT PELVIS) 3-4V BILAT COMPARISON:  Abdominal CT 07/22/2015 FINDINGS: Chronic trabecular coarsening and bony expansion affecting the right more than left pelvis and proximal right femur, pagetoid changes. There is extensive fixation hardware to the left iliac wing without acute superimposed finding. No visible fracture as permitted by osteopenia. Lumbosacral posterior decompression and fusion. Severe L4-5 disc degeneration IMPRESSION: 1. No acute finding. 2. 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 3. Postoperative left ilium and lumbosacral  junction. 4. Severe L4-5 disc degeneration. Electronically Signed   By: JMonte FantasiaM.D.   On: 04/12/2017 16:00      Assessment & Plan:    Principal Problem:   Discitis    Discitis/  osteomyelitis Blood culture x2 ESR,  IR consult for aspiration of phlegmon Appreciate neurosurgery consult Appreciate ID consult vanco iv pharmacy to dose, cefepime iv pharmacy to dose Transfer to [facility] since neurosurgery at [facility]  Tachycardia Tele Trop I q6h x3 Check cpk, mb D dimer, if positive then please obtain CTA chest  Check echo  Chronic back pain Spinal stenosis Cont meloxicam Cont gabapentin  Schizophrenia Cont invega, paliperidone  Bph Cont cardura, cont finasteride  Hypertension Cont losartan  Hyperlpidemia Cont pravastatin  H/o elevated troponin ? CAD Pt is on plavix since at least 06/15/2015 per discharge summary   DVT Prophylaxis  SCDs , no lovenox in anticipation of aspiration tomorrow   AM Labs Ordered, also please review Full Orders  Family Communication: Admission, patients condition and plan of care including tests being ordered have been discussed with the patient/ sister Katy Apo  who indicate understanding and agree with the plan and Code Status.  Code Status FULL CODE  Likely DC to  TBD  Condition GUARDED   Consults called: neurosurgery by Ed, ID by ED, IR consult placed in computer, please call tomorrow to see if can aspirate phlegmon Admission status: inpatient  Time spent in minutes : 45   Jani Gravel M.D on 04/12/2017 at 8:51 PM  Between 7am to 7pm - Pager - [phone]   After 7pm go to [email or web address] - password [facility]  [facility] - Office  [phone]

## 2017-04-12 NOTE — Progress Notes (Signed)
Pharmacy Antibiotic Note  Norman Davis is a 76 y.o. male admitted on 04/12/2017 with discitis.  Pharmacy has been consulted for vancomycin and cefepime dosing.  Today, 04/12/2017  Renal SCr WNL  WBC slightly elevated  Plan:  Vancomycin 1500mg  IV x 1 then 1250mg  IV q24h  Cefepime 2gm IV q12h  Monitor renal function  Check levels if remains on vancomycin > 4 days  ID consulted per orders  Height: 5\' 9"  (175.3 cm) Weight: 165 lb (74.8 kg) IBW/kg (Calculated) : 70.7  Temp (24hrs), Avg:98.4 F (36.9 C), Min:98.4 F (36.9 C), Max:98.4 F (36.9 C)  Recent Labs  Lab 04/12/17 1440 04/12/17 1913  WBC 11.0*  --   CREATININE 0.92  --   LATICACIDVEN  --  0.61    Estimated Creatinine Clearance: 66.2 mL/min (by C-G formula based on SCr of 0.92 mg/dL).    Allergies  Allergen Reactions  . Aspirin     unknown  . Penicillins     Unknown Has patient had a PCN reaction causing immediate rash, facial/tongue/throat swelling, SOB or lightheadedness with hypotension: NO Has patient had a PCN reaction causing severe rash involving mucus membranes or skin necrosis: NO Has patient had a PCN reaction that required hospitalization NO Has patient had a PCN reaction occurring within the last 10 years: NO If all of the above answers are "NO", then may proceed with Cephalosporin use.    Antimicrobials this admission: 12/12 vanco >> 12/12 cefepime >>  Dose adjustments this admission:  Microbiology results: 12/12 BCx:   Thank you for allowing pharmacy to be a part of this patient's care.  Juliette Alcideustin Zeigler, PharmD, BCPS.   Pager: [phone] 04/12/2017 7:58 PM

## 2017-04-12 NOTE — ED Provider Notes (Signed)
Pt initially seen by PA Malachi CarlH. Khatri, and my partner dr. Ranae PalmsYelverton.  Is normally ambulatory with assist of a walker.  Lives at home with his sister.  Has history of schizophrenia.  Other significant history includes hypertension, Paget's disease, hypercholesterol, depression.  Had recent falls.  Denies fever.  Ran out of Valium prescription 2-3 days ago.  Presented earlier today with weakness and back pain and inability to ambulate.  Patient sleepy.  He will awaken to voice.  He is minimally involved with examination, even with my urging.  Patient afebrile to my exam on rectal temp.  Has symmetric strength of his upper extremities.  Keeps his left leg externally rotated.  He has 3 out of 5 strength to the right lower extremity 2 out of 5 strength left lower extremity.  Again, effort is poor his rectal tone is diminished, but not flaccid.  He has no appreciable DTRs to the Achilles and knee jerk reflexes.  Requested blood cultures.  Will order IV antibiotics.  Will discuss with neurosurgery.  Will require admission.  20:22:   Patient admitted to hospitalist.  I discussed the case with neurosurgery, Dr. Lovell SheehanJenkins.  He [facility] if [facility] consultation was recommended.  He recommended discussion with IR regarding percutaneous drainage and ID consult.  I placed called infectious disease.  Patient has been given vancomycin and Maxipime.  Arrangements transfer being made currently.   Rolland PorterJames, [name], MD 04/12/17 2023

## 2017-04-13 ENCOUNTER — Inpatient Hospital Stay (HOSPITAL_COMMUNITY): Payer: Medicare Other

## 2017-04-13 ENCOUNTER — Other Ambulatory Visit: Payer: Self-pay

## 2017-04-13 ENCOUNTER — Encounter (HOSPITAL_COMMUNITY): Payer: Self-pay | Admitting: Interventional Radiology

## 2017-04-13 DIAGNOSIS — I5030 Unspecified diastolic (congestive) heart failure: Secondary | ICD-10-CM

## 2017-04-13 DIAGNOSIS — M48062 Spinal stenosis, lumbar region with neurogenic claudication: Secondary | ICD-10-CM

## 2017-04-13 DIAGNOSIS — M6282 Rhabdomyolysis: Secondary | ICD-10-CM

## 2017-04-13 DIAGNOSIS — I10 Essential (primary) hypertension: Secondary | ICD-10-CM

## 2017-04-13 HISTORY — PX: IR LUMBAR DISC ASPIRATION W/IMG GUIDE: IMG5306

## 2017-04-13 LAB — ECHOCARDIOGRAM COMPLETE
Height: 69 in
Weight: 2640 oz

## 2017-04-13 LAB — CBC
HCT: 38.4 % — ABNORMAL LOW (ref 39.0–52.0)
Hemoglobin: 12.6 g/dL — ABNORMAL LOW (ref 13.0–17.0)
MCH: 29 pg (ref 26.0–34.0)
MCHC: 32.8 g/dL (ref 30.0–36.0)
MCV: 88.5 fL (ref 78.0–100.0)
PLATELETS: 157 10*3/uL (ref 150–400)
RBC: 4.34 MIL/uL (ref 4.22–5.81)
RDW: 15.1 % (ref 11.5–15.5)
WBC: 14.1 10*3/uL — ABNORMAL HIGH (ref 4.0–10.5)

## 2017-04-13 LAB — TSH: TSH: 1.869 u[IU]/mL (ref 0.350–4.500)

## 2017-04-13 LAB — GLUCOSE, CAPILLARY: GLUCOSE-CAPILLARY: 108 mg/dL — AB (ref 65–99)

## 2017-04-13 LAB — D-DIMER, QUANTITATIVE: D-Dimer, Quant: 3.84 ug/mL-FEU — ABNORMAL HIGH (ref 0.00–0.50)

## 2017-04-13 LAB — MAGNESIUM: MAGNESIUM: 1.5 mg/dL — AB (ref 1.7–2.4)

## 2017-04-13 MED ORDER — FENTANYL CITRATE (PF) 100 MCG/2ML IJ SOLN
INTRAMUSCULAR | Status: AC
  Filled 2017-04-13: qty 4

## 2017-04-13 MED ORDER — ACETAMINOPHEN 650 MG RE SUPP
650.0000 mg | Freq: Four times a day (QID) | RECTAL | Status: DC | PRN
  Administered 2017-04-16 (×2): 650 mg via RECTAL
  Filled 2017-04-13 (×2): qty 1

## 2017-04-13 MED ORDER — SODIUM CHLORIDE 0.9 % IV SOLN
INTRAVENOUS | Status: DC
  Administered 2017-04-13: 100 mL/h via INTRAVENOUS

## 2017-04-13 MED ORDER — ALBUTEROL SULFATE (2.5 MG/3ML) 0.083% IN NEBU
2.5000 mg | INHALATION_SOLUTION | RESPIRATORY_TRACT | Status: DC
  Filled 2017-04-13: qty 3

## 2017-04-13 MED ORDER — POLYETHYLENE GLYCOL 3350 17 G PO PACK: 17.0000 g | PACK | Freq: Every day | ORAL | Status: DC | PRN

## 2017-04-13 MED ORDER — PANTOPRAZOLE SODIUM 40 MG PO TBEC
40.0000 mg | DELAYED_RELEASE_TABLET | Freq: Every day | ORAL | Status: DC
  Administered 2017-04-14 – 2017-04-20 (×6): 40 mg via ORAL
  Filled 2017-04-13 (×7): qty 1

## 2017-04-13 MED ORDER — SODIUM CHLORIDE 0.9 % IV SOLN
INTRAVENOUS | Status: AC
  Administered 2017-04-13 – 2017-04-14 (×3): via INTRAVENOUS

## 2017-04-13 MED ORDER — MELATONIN 3 MG PO TABS
1.0000 | ORAL_TABLET | Freq: Every day | ORAL | Status: DC
  Administered 2017-04-14 – 2017-04-19 (×4): 3 mg via ORAL
  Filled 2017-04-13 (×10): qty 1

## 2017-04-13 MED ORDER — AMLODIPINE BESYLATE 10 MG PO TABS
10.0000 mg | ORAL_TABLET | Freq: Every day | ORAL | Status: DC
  Administered 2017-04-14 – 2017-04-20 (×6): 10 mg via ORAL
  Filled 2017-04-13 (×7): qty 1

## 2017-04-13 MED ORDER — SENNA 8.6 MG PO TABS
1.0000 | ORAL_TABLET | Freq: Two times a day (BID) | ORAL | Status: DC
  Administered 2017-04-14 – 2017-04-20 (×11): 8.6 mg via ORAL
  Filled 2017-04-13 (×13): qty 1

## 2017-04-13 MED ORDER — GABAPENTIN 100 MG PO CAPS
100.0000 mg | ORAL_CAPSULE | Freq: Every day | ORAL | Status: DC
  Administered 2017-04-15 – 2017-04-16 (×3): 100 mg via ORAL
  Filled 2017-04-13 (×4): qty 1

## 2017-04-13 MED ORDER — POTASSIUM CHLORIDE CRYS ER 20 MEQ PO TBCR
40.0000 meq | EXTENDED_RELEASE_TABLET | ORAL | Status: AC
  Administered 2017-04-13: 40 meq via ORAL
  Filled 2017-04-13: qty 2

## 2017-04-13 MED ORDER — TIOTROPIUM BROMIDE MONOHYDRATE 18 MCG IN CAPS
18.0000 ug | ORAL_CAPSULE | Freq: Every day | RESPIRATORY_TRACT | Status: DC
  Administered 2017-04-14 – 2017-04-20 (×7): 18 ug via RESPIRATORY_TRACT
  Filled 2017-04-13 (×2): qty 5

## 2017-04-13 MED ORDER — MIDAZOLAM HCL 2 MG/2ML IJ SOLN
INTRAMUSCULAR | Status: AC
  Filled 2017-04-13: qty 4

## 2017-04-13 MED ORDER — MIRTAZAPINE 15 MG PO TABS
30.0000 mg | ORAL_TABLET | Freq: Every day | ORAL | Status: DC
  Administered 2017-04-15 – 2017-04-16 (×2): 30 mg via ORAL
  Filled 2017-04-13 (×3): qty 2

## 2017-04-13 MED ORDER — ACETAMINOPHEN 325 MG PO TABS
650.0000 mg | ORAL_TABLET | Freq: Four times a day (QID) | ORAL | Status: DC | PRN
  Administered 2017-04-20 (×2): 650 mg via ORAL
  Filled 2017-04-13 (×4): qty 2

## 2017-04-13 MED ORDER — MELOXICAM 7.5 MG PO TABS
7.5000 mg | ORAL_TABLET | Freq: Every day | ORAL | Status: DC
  Administered 2017-04-14: 7.5 mg via ORAL
  Filled 2017-04-13: qty 1

## 2017-04-13 MED ORDER — FOLIC ACID 1 MG PO TABS
1.0000 mg | ORAL_TABLET | Freq: Every day | ORAL | Status: DC
  Administered 2017-04-14 – 2017-04-20 (×6): 1 mg via ORAL
  Filled 2017-04-13 (×8): qty 1

## 2017-04-13 MED ORDER — CLOPIDOGREL BISULFATE 75 MG PO TABS
75.0000 mg | ORAL_TABLET | Freq: Every day | ORAL | Status: DC
  Administered 2017-04-14 – 2017-04-20 (×6): 75 mg via ORAL
  Filled 2017-04-13 (×7): qty 1

## 2017-04-13 MED ORDER — LIDOCAINE HCL (PF) 1 % IJ SOLN
INTRAMUSCULAR | Status: AC
  Filled 2017-04-13: qty 30

## 2017-04-13 MED ORDER — FLUOXETINE HCL 20 MG PO CAPS
40.0000 mg | ORAL_CAPSULE | Freq: Every day | ORAL | Status: DC
  Administered 2017-04-14 – 2017-04-20 (×6): 40 mg via ORAL
  Filled 2017-04-13 (×7): qty 2

## 2017-04-13 MED ORDER — LOSARTAN POTASSIUM 50 MG PO TABS
25.0000 mg | ORAL_TABLET | Freq: Every day | ORAL | Status: DC
  Administered 2017-04-14 – 2017-04-20 (×6): 25 mg via ORAL
  Filled 2017-04-13 (×7): qty 1

## 2017-04-13 MED ORDER — ALBUTEROL SULFATE HFA 108 (90 BASE) MCG/ACT IN AERS: 2.0000 | INHALATION_SPRAY | RESPIRATORY_TRACT | Status: DC | PRN

## 2017-04-13 MED ORDER — FLUTICASONE PROPIONATE 50 MCG/ACT NA SUSP
2.0000 | Freq: Every day | NASAL | Status: DC
  Administered 2017-04-13 – 2017-04-19 (×6): 2 via NASAL
  Filled 2017-04-13 (×2): qty 16

## 2017-04-13 MED ORDER — PRAVASTATIN SODIUM 40 MG PO TABS
40.0000 mg | ORAL_TABLET | Freq: Every day | ORAL | Status: DC
  Filled 2017-04-13: qty 1

## 2017-04-13 MED ORDER — LORATADINE 10 MG PO TABS
10.0000 mg | ORAL_TABLET | Freq: Every day | ORAL | Status: DC
  Administered 2017-04-14 – 2017-04-20 (×6): 10 mg via ORAL
  Filled 2017-04-13 (×7): qty 1

## 2017-04-13 MED ORDER — HYDROCODONE-ACETAMINOPHEN 5-325 MG PO TABS
1.0000 | ORAL_TABLET | ORAL | Status: DC | PRN
  Administered 2017-04-14: 1 via ORAL
  Filled 2017-04-13 (×3): qty 1

## 2017-04-13 MED ORDER — FINASTERIDE 5 MG PO TABS
5.0000 mg | ORAL_TABLET | Freq: Every day | ORAL | Status: DC
  Administered 2017-04-14 – 2017-04-20 (×6): 5 mg via ORAL
  Filled 2017-04-13 (×7): qty 1

## 2017-04-13 MED ORDER — DOXAZOSIN MESYLATE 8 MG PO TABS
8.0000 mg | ORAL_TABLET | Freq: Every day | ORAL | Status: DC
  Administered 2017-04-14 – 2017-04-19 (×6): 8 mg via ORAL
  Filled 2017-04-13 (×7): qty 1

## 2017-04-13 MED ORDER — MIRABEGRON ER 25 MG PO TB24
25.0000 mg | ORAL_TABLET | Freq: Every day | ORAL | Status: DC
  Administered 2017-04-14 – 2017-04-20 (×6): 25 mg via ORAL
  Filled 2017-04-13 (×6): qty 1

## 2017-04-13 MED ORDER — VITAMIN D 1000 UNITS PO TABS
2000.0000 [IU] | ORAL_TABLET | Freq: Every day | ORAL | Status: DC
  Administered 2017-04-14 – 2017-04-20 (×6): 2000 [IU] via ORAL
  Filled 2017-04-13 (×8): qty 2

## 2017-04-13 NOTE — Progress Notes (Signed)
[facility] PROGRESS NOTE  Norman Davis ZOX:[id number] DOB: [birth year] DOA: 04/12/2017 PCP: Kirt Boysarter, Monica, DO  Interim summary and HPI 76 y.o. male, w hx of Schizophrenia, Hypertension, Hyperlipidemia, Pagets, Genella RifeGerd, Spinal stenosis, prior back [place] and ESI several months ago by Dr. Ollen BowlHarkins presents w c/o inability to walk starting on Monday. He has been afebrile per his sister. Complaining of severe lower back pain. MRI demonstrating spinal stenosis with bilateral root impingement and also L3-L4 diskitis with adjacent osteomyelitis and paravertebral phlegmon that extends into the psoas muscles.    Assessment/Plan: 1-lumbar discitis/osteomyelitis -After discussing with infectious disease culture has been taken and patient has been started vancomycin and cefepime. -IR has been consulted for phlegmon aspiration (currently waiting on patient to be [facility]) -Neurosurgery has also been made aware of patient case and they will be examined the patient once he is [facility]. -Continue supportive care, fluid resuscitation, PRN analgesics.  2-rhabdomyolysis -CK level in the 3.5k range -Continue IV fluids -Renal function stable -Follow CK level.  3-hypokalemia and hypomagnesemia -Continue electrolytes repletion and follow trend -Currently monitoring on telemetry due to low K  4-essential hypertension -Continue Cozaar, amlodipine and as needed hydralazine  5-hx of BPH -continue proscar  6-COPD and rhinitis -stable and well controlled -no wheezing on exam and good O2 sat on RA -will continue flonase, spiriva and PRN albuterol -also continue daily claritin   7-HLD -continue statin  8-hx of schizophrenia -stable and well controlled -continue invega and paliperidone   9-prior hx of CAD and TIA -continue plavix -no deficits appreciated -no CP  10-chronic back pain/spinal stenosis  -continue neurontin  and mobic -PRN analgesics also ordered for acute pain component    Code Status: Full code Family Communication: No family at bedside Disposition Plan: Patient is [facility] to [facility] in order to have further assessment by neurosurgery and anticipated IR evaluation for percutaneous drainage of a phlegmon in his lumbar spine.  Follow culture results, continue IV antibiotics, continue as needed analgesics and fluid resuscitation for rhabdomyolysis.   Consultants:  Neurosurgery  ID  IR  Procedures:  Echo has been order: Pending at this moment  See below for x-ray reports  Antibiotics:  Vancomycin and cefepime 04/12/17  HPI/Subjective: Afebrile, no chest pain, no shortness of breath.  Patient reports significant pain in his lower back and inability to move his legs.  Objective: Vitals:   04/13/17 0600 04/13/17 0611  BP: (!) 164/103 (!) 164/103  Pulse:  97  Resp: 16 16  Temp:    SpO2:  97%   No intake or output data in the 24 hours ending 04/13/17 0842 Filed Weights   04/12/17 1353  Weight: 74.8 kg (165 lb)    Exam:   General: Afebrile, no chest pain, no shortness of breath.  Patient reports to have significant back pain and continued to experience difficulty moving his legs.  Cardiovascular: S1 and S2, no rubs, no gallops, no JVD on exam.  Respiratory: Good air movement bilaterally, no wheezing, no crackles; normal respiratory effort.  Abdomen: Soft, nontender, nondistended, positive bowel sounds  Musculoskeletal: Poorly able to wiggle his toes only and not able to leave his legs against gravity; discomfort to his lower back with minimal leg raise bilaterally.  Data Reviewed: Basic Metabolic Panel: Recent Labs  Lab 04/12/17 1440  NA 142  K 2.9*  CL 105  CO2 27  GLUCOSE 95  BUN  15  CREATININE 0.92  CALCIUM 9.4   CBC: Recent Labs  Lab 04/12/17 1440  WBC 11.0*  NEUTROABS 8.7*  HGB 14.7  HCT 43.6  MCV 87.6   PLT 182   Cardiac Enzymes: Recent Labs  Lab 04/12/17 2210  CKTOTAL 3,553*  CKMB 4.4   Studies: Dg Ribs Unilateral W/chest Right  Result Date: 04/12/2017 CLINICAL DATA:  Generalized weakness. Fell 2 days ago. RIGHT-sided rib pain. EXAM: RIGHT RIBS AND CHEST - 3+ VIEW COMPARISON:  Chest radiograph 07/29/2015. FINDINGS: No fracture or other bone lesions are seen involving the ribs. There is no evidence of pneumothorax or pleural effusion. 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 Apical bullous change is prominent. No consolidation or edema. Cardiac silhouette normal sized. Tortuosity of the aorta with calcification, consistent with atherosclerosis. IMPRESSION: Changes of COPD.  No acute consolidation or edema. No rib fractures are identified.  There is no pneumothorax. Electronically Signed   By: John T Curnes M.D.   On: 04/12/2017 16:00   Dg Lumbar Spine Complete  Result Date: 04/12/2017 CLINICAL DATA:  76 y/o male with generalize weakness and not ambulating for 3 days. Recent fall. Low back pain, bilateral hip pain. Prior surgery. EXAM: LUMBAR SPINE - COMPLETE 4+ VIEW COMPARISON:  Lumbar radiographs 09/03/2015. CT Abdomen and Pelvis 07/22/2015. Lumbar MRI 04/28/2015. FINDINGS: Chronic postoperative changes to the left iliac wing, left SI joint, and L5-S1 spinal levels. Chronic IVC filter. Chronic degenerative appearing sclerosis of the L3 and L4 vertebral bodies, but associated irregular endplate erosion at L3-L4 is new since the 2017 radiographs (image 5). Other lumbar levels appear stable. Visible lower thoracic levels appears stable. IMPRESSION: 1. Chronic L3 and L4 vertebral body sclerosis with erosion of the endplates since May 2017. While this could be degenerative in nature, consider Discitis Osteomyelitis. Lumbar MRI without and with contrast would best evaluate further. 2. Stable postoperative appearance of L5-S1 and  the left hemipelvis. 3. Note that prior lumbar MRI was in 2016, and chronic spinal and pelvic hardware plus IVC filter are unchanged since 2014. Electronically Signed   By: H  Hall M.D.   On: 04/12/2017 16:02   Ct Head Wo Contrast  Result Date: 04/12/2017 CLINICAL DATA:  Generalized weakness EXAM: CT HEAD WITHOUT CONTRAST TECHNIQUE: Contiguous axial images were obtained from the base of the skull through the vertex without intravenous contrast. COMPARISON:  07/22/2015 FINDINGS: Brain: Diffuse atrophic and chronic white matter ischemic change is seen. No findings to suggest acute hemorrhage, acute infarction or space-occupying mass lesion are noted. Vascular: No hyperdense vessel or unexpected calcification. Skull: Normal. Negative for fracture or focal lesion. Sinuses/Orbits: No acute finding. Other: None IMPRESSION: Chronic atrophic and ischemic changes. No acute abnormality is noted. Electronically Signed   By: Mark  Lukens M.D.   On: 04/12/2017 16:34   Mr Lumbar Spine W Wo Contrast  Result Date: 04/12/2017 CLINICAL DATA:  Suspected osteomyelitis. Previous lumbar surgery. Back pain. Unable to bear weight. EXAM: MRI LUMBAR SPINE WITHOUT AND WITH CONTRAST TECHNIQUE: Multiplanar and multiecho pulse sequences of the lumbar spine were obtained without and with intravenous contrast. CONTRAST:  [phone]! InceOBENATE DIMEGLUMINE 529 MG/ML IV SOLN COMPARISON:  Plain films earlier today. MRI lumbar spine 04/28/2015. FINDINGS: Segmentation:  Standard. Alignment: Degenerative anterolisthesis L4 on L5, 5 mm, stable from 2016. Vertebrae: Fatty replaced L5 and S1 vertebral bodies, appears similar to priors. Susceptibility artifact due to pedicle screws at L5 and S1 limits further assessment. Bone  marrow edema of an acute nature at L3 and L4, related to L3-4 discal hyperintensity. Abnormal enhancement is observed on postcontrast images. L3 and L4 osteomyelitis is likely, given the plain film appearance. Conus  medullaris and cauda equina: Conus extends to the L1-L2 level. Conus appears normal. Clumping of lower lumbar nerve roots could represent arachnoiditis. No intradural enhancement. Paraspinal and other soft tissues: Paravertebral soft tissue phlegmon extends into both psoas muscles. Disc levels: L1-L2:  Disc desiccation.  Facet arthropathy.  No impingement. L2-L3: Loss of disc height and signal. Shallow central protrusion. Schmorl's nodes. Posterior element hypertrophy. Mild stenosis. No definite impingement. L3-L4: Loss of interspace height, with abnormal T2 hyperintensity centrally accompanied by postcontrast enhancement, consistent with L3-L4 diskitis. Posterior protrusion of disc material. Facet hypertrophy and ligamentum flavum overgrowth contribute to severe stenosis with BILATERAL L3 and L4 nerve root impingement. Ventral and dorsal dural enhancement, and enhancement of the ligamentum flava, but no discrete epidural abscess. L4-L5: 5 mm anterolisthesis. Chronic loss of disc height. Uncovering of the disc without protrusion. Posterior element hypertrophy. Moderate to severe stenosis. BILATERAL L4 and L5 nerve root impingement. L5-S1: Apparent solid interbody and posterior arthrodesis. Adequate posterior decompression. No definite impingement. 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 Paravertebral phlegmon extends to both psoas muscles. No discrete epidural abscess. Central protrusion in conjunction with posterior element hypertrophy results in severe stenosis affecting the L3 and L4 nerve roots. Stable 5 mm anterolisthesis at L4-5 with chronic loss of disc height. Posterior element hypertrophy contributes to moderate to severe stenosis with BILATERAL L4 and L5 nerve root impingement. Electronically Signed   By: Elsie Stain M.D.   On: 04/12/2017 18:44   Dg Hips Bilat W Or Wo Pelvis 3-4 Views  Result Date: 04/12/2017 CLINICAL DATA:   Generalized weakness. EXAM: DG HIP (WITH OR WITHOUT PELVIS) 3-4V BILAT COMPARISON:  Abdominal CT 07/22/2015 FINDINGS: Chronic trabecular coarsening and bony expansion affecting the right more than left pelvis and proximal right femur, pagetoid changes. There is extensive fixation hardware to the left iliac wing without acute superimposed finding. No visible fracture as permitted by osteopenia. Lumbosacral posterior decompression and fusion. Severe L4-5 disc degeneration IMPRESSION: 1. No acute finding. 2. 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 3. Postoperative left ilium and lumbosacral junction. 4. Severe L4-5 disc degeneration. Electronically Signed   By: Marnee Spring M.D.   On: 04/12/2017 16:00    Scheduled Meds: . potassium chloride  40 mEq Oral Q4H   Continuous Infusions: . sodium chloride    . ceFEPime (MAXIPIME) IV    . vancomycin      Time spent: 30 minutes   Vassie Loll  [facility] Pager [phone]. If 7PM-7AM, please contact night-coverage at [email or web address], password [facility] 04/13/2017, 8:42 AM  LOS: 1 day

## 2017-04-13 NOTE — ED Notes (Signed)
Bed: WA17 Expected date:  Expected time:  Means of arrival:  Comments: Hold for IR.

## 2017-04-13 NOTE — ED Notes (Signed)
1200 potassium held til after radiology procedure due to patient being NPO

## 2017-04-13 NOTE — Progress Notes (Addendum)
This RN went into patient's room to attempt to administer PO medications. When giving the patient some water to drink, he started sputtering and coughing. Stroke swallow screening was performed (see Flowsheets). Protocol orders initiated to change patient's diet to NPO and order SLP eval. Toniann FailKakrakandy, MD notified

## 2017-04-13 NOTE — Progress Notes (Signed)
D. Dimer 3.84  Toniann FailKakrakandy, MD notified

## 2017-04-13 NOTE — Procedures (Signed)
  Procedure: Disc aspiration under fluoro L3-4 17g Preprocedure diagnosis: Discitis Postprocedure diagnosis: same EBL:   minimal Complications:  none immediate  See full dictation in [employer] [employer] PACS.  Thora Lance. Cristal Qadir MD Main # [phone] Pager  [phone]

## 2017-04-13 NOTE — ED Notes (Signed)
Spoke with Jeananne RamaKevin Allred, PA and states it is best that we do the procedure here since there was a radiologist present that could do that procedure. Patient will be held until after the procedure then [facility].

## 2017-04-13 NOTE — ED Notes (Signed)
Patient transported to IR via IR staff.

## 2017-04-13 NOTE — Progress Notes (Signed)
  Echocardiogram 2D Echocardiogram has been performed.  Leta JunglingCooper, Machaela Caterino M 04/13/2017, 1:08 PM

## 2017-04-13 NOTE — ED Notes (Signed)
[facility] called for transportation to [facility].

## 2017-04-13 NOTE — Consult Note (Signed)
Chief Complaint: Patient was seen in consultation today for fluoroscopic guided aspiration of L3-4 disc space Chief Complaint  Patient presents with  . Weakness    Referring Physician(s): [place],C  Supervising Physician: Oley Balm  Patient Status: [facility] - ED  History of Present Illness: Norman Davis is a 76 y.o. male with history of hypertension, coronary artery disease, TIA, COPD, Paget's disease of bone, schizophrenia, BPH, spinal stenosis, prior back surgery in the remote past in [place] and steroid injection several months ago by Dr. Ollen Bowl who presented to [facility] ED yesterday with complaints of inability to walk as well as severe lower back pain starting this past Monday.  He denies fever.  Laboratory studies reveal markedly elevated CK consistent with rhabdomyolysis, WBC 11.0, hemoglobin 14.7, platelets 182 k, potassium 2.9 -getting replacement , creatinine 0.92. 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  There was paravertebral phlegmon extending to both psoas muscles but no discrete epidural abscess. Request now received from internal medicine for image guided aspiration of L3-4 disc space.   Past Medical History:  Diagnosis Date  . Cholelithiasis   . Fall 07/22/2015  . GERD (gastroesophageal reflux disease)   . H/O urinary retention   . Hypercholesteremia   . Hypertension   . Nephrolithiasis    right  . 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 Schizophrenia (HCC)   . Shortness of breath dyspnea   . Small bowel obstruction due to adhesions [facility])     Past Surgical History:  Procedure Laterality Date  . BACK SURGERY    . LIVER SURGERY    . PELVIC FRACTURE SURGERY    . TOTAL HIP ARTHROPLASTY      Allergies: Aspirin and Penicillins  Medications: Prior to Admission medications   Medication Sig Start Date End Date Taking? Authorizing Provider    Cholecalciferol (VITAMIN D) 2000 units tablet Take 2,000 Units by mouth daily.   Yes [provider]  clopidogrel (PLAVIX) 75 MG tablet Take 75 mg by mouth daily with breakfast.   Yes [provider]  doxazosin (CARDURA) 8 MG tablet Take 8 mg by mouth at bedtime.   Yes [provider]  ENSURE PLUS (ENSURE PLUS) LIQD Take 237 mLs by mouth 3 (three) times daily between meals.   Yes [provider]  finasteride (PROSCAR) 5 MG tablet Take 5 mg by mouth daily.   Yes [provider]  FLUoxetine (PROZAC) 20 MG capsule Take 40 mg by mouth daily.    Yes [provider]  fluticasone (FLONASE) 50 MCG/ACT nasal spray Place 2 sprays into both nostrils at bedtime.   Yes [provider]  folic acid (FOLVITE) 1 MG tablet Take 1 mg by mouth daily.   Yes [provider]  lidocaine (LIDODERM) 5 % Place 1 patch onto the skin every 12 (twelve) hours. Remove & Discard patch within 12 hours or as directed by MD   Yes [provider]  losartan (COZAAR) 50 MG tablet Take 25 mg by mouth daily.   Yes [provider]  Melatonin 3 MG CAPS Take 1 capsule by mouth at bedtime.   Yes [provider]  Mirabegron (MYRBETRIQ PO) Take 25 mg by mouth daily.    Yes [provider]  Paliperidone Palmitate 546 MG/1.75ML SUSP Inject 546 mg into the muscle every 3 (three) months.   Yes [provider]  pantoprazole (PROTONIX) 40 MG  tablet Take 40 mg by mouth daily.   Yes [provider]  polyethylene glycol (MIRALAX / GLYCOLAX) packet Take 17 g by mouth daily as needed for moderate constipation.   Yes [provider]  pravastatin (PRAVACHOL) 40 MG tablet Take 40 mg by mouth at bedtime.   Yes [provider]  senna (SENOKOT) 8.6 MG TABS tablet Take 2 tablets by mouth 2 (two) times daily.   Yes [provider]  SPIRIVA HANDIHALER 18 MCG inhalation capsule INHALE 1 PUFF VIA HANDIHALER EVERY  FOR COPD 08/21/15  Yes Margit HanksAlexander, Anne D, MD  acetaminophen (TYLENOL) 500 MG tablet Take 1,000 mg by mouth every 6 (six) hours as needed for mild pain.    [provider]  albuterol (PROVENTIL HFA;VENTOLIN HFA) 108 (90 BASE) MCG/ACT inhaler Inhale 2 puffs into the lungs every 4 (four) hours as needed for wheezing or shortness of breath.    [provider]  amLODipine (NORVASC) 10 MG tablet Take 10 mg by mouth daily.    [provider]  cetirizine (ZYRTEC) 10 MG tablet Take 10 mg by mouth at bedtime.     [provider]  gabapentin (NEURONTIN) 100 MG capsule Take 100 mg by mouth at bedtime.    [provider]  HYDROcodone-acetaminophen (NORCO/VICODIN) 5-325 MG tablet Take 2 tablets by mouth every 4 (four) hours as needed for severe pain. Patient not taking: Reported on 04/12/2017 09/17/15   Emi HolesLaw, Alexandra M, PA-C  meloxicam (MOBIC) 7.5 MG tablet Take 7.5 mg by mouth daily.    [provider]  mirtazapine (REMERON) 30 MG tablet Take 30 mg by mouth at bedtime.    [provider]  oxyCODONE-acetaminophen (PERCOCET/ROXICET) 5-325 MG tablet Take 1 tablet by mouth every 4 (four) hours as needed for moderate pain. Patient not taking: Reported on 04/12/2017 07/25/15   Leatha GildingGherghe, Costin M, MD  paliperidone (INVEGA SUSTENNA) 156 MG/ML SUSP injection Inject 156 mg into the muscle every 28 (twenty-eight) days.    [provider]  pravastatin (PRAVACHOL) 40 MG tablet Take 40 mg by mouth at bedtime.     [provider]  senna (SENOKOT) 8.6 MG TABS tablet Take 1 tablet by mouth 2 (two) times daily.     [provider]     Family History  Problem Relation Age of Onset  . Cancer Mother   . Cancer Father     Social History   Socioeconomic History  . Marital status: Divorced    Spouse name: None  . Number of children: None  . Years of education: None  . Highest education level: None  Social Needs  . Financial resource  strain: None  . Food insecurity - worry: None  . Food insecurity - inability: None  . Transportation needs - medical: None  . Transportation needs - non-medical: None  Occupational History  . None  Tobacco Use  . Smoking status: Former Smoker    Packs/day: 1.00    Types: Cigarettes    Last attempt to quit: 05/02/2000    Years since quitting: 16.9  . Smokeless tobacco: Never Used  Substance and Sexual Activity  . Alcohol use: No  . Drug use: No  . Sexual activity: No  Other Topics Concern  . None  Social History Narrative  . None     Review of Systems currently denies fever, chest pain, worsening dyspnea, nausea, vomiting or bleeding.  Patient has back pain and very little movement of arms or legs.  Vital Signs:  BP (!) 143/96   Pulse (!) 109   Temp 100 F (37.8 C) (Oral)   Resp 16   Ht 5\' 9"  (1.753 m)   Wt 165 lb (74.8 kg)   SpO2 98%   BMI 24.37 kg/m   Physical Exam patient awake, answers questions appropriately.  Chest with clear but distant breath sounds bilaterally.  Heart with slightly tachycardic but regular rhythm.  Abdomen soft, positive bowel sounds, nontender.  Patient with difficulty moving arms or legs,-can wiggle toes minimally and raise arms only short distance.  Imaging: Dg Ribs Unilateral W/chest Right  Result Date: 04/12/2017 CLINICAL DATA:  Generalized weakness. Fell 2 days ago. RIGHT-sided rib pain. EXAM: RIGHT RIBS AND CHEST - 3+ VIEW COMPARISON:  Chest radiograph 07/29/2015. FINDINGS: No fracture or other bone lesions are seen involving the ribs. There is no evidence of pneumothorax or pleural effusion. 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 Apical bullous change is prominent. No consolidation or edema. Cardiac silhouette normal sized. Tortuosity of the aorta with calcification, consistent with atherosclerosis. IMPRESSION: Changes of COPD.  No acute consolidation or edema. No rib  fractures are identified.  There is no pneumothorax. Electronically Signed   By: Elsie StainJohn T Curnes M.D.   On: 04/12/2017 16:00   Dg Lumbar Spine Complete  Result Date: 04/12/2017 CLINICAL DATA:  76 y/o male with generalize weakness and not ambulating for 3 days. Recent fall. Low back pain, bilateral hip pain. Prior surgery. EXAM: LUMBAR SPINE - COMPLETE 4+ VIEW COMPARISON:  Lumbar radiographs 09/03/2015. CT Abdomen and Pelvis 07/22/2015. Lumbar MRI 04/28/2015. FINDINGS: Chronic postoperative changes to the left iliac wing, left SI joint, and L5-S1 spinal levels. Chronic IVC filter. Chronic degenerative appearing sclerosis of the L3 and L4 vertebral bodies, but associated irregular endplate erosion at L3-L4 is new since the 2017 radiographs (image 5). Other lumbar levels appear stable. Visible lower thoracic levels appears stable. IMPRESSION: 1. Chronic L3 and L4 vertebral body sclerosis with erosion of the endplates since May 2017. While this could be degenerative in nature, consider Discitis Osteomyelitis. Lumbar MRI without and with contrast would best evaluate further. 2. Stable postoperative appearance of L5-S1 and the left hemipelvis. 3. Note that prior lumbar MRI was in 2016, and chronic spinal and pelvic hardware plus IVC filter are unchanged since 2014. Electronically Signed   By: Odessa FlemingH  Hall M.D.   On: 04/12/2017 16:02   Ct Head Wo Contrast  Result Date: 04/12/2017 CLINICAL DATA:  Generalized weakness EXAM: CT HEAD WITHOUT CONTRAST TECHNIQUE: Contiguous axial images were obtained from the base of the skull through the vertex without intravenous contrast. COMPARISON:  07/22/2015 FINDINGS: Brain: Diffuse atrophic and chronic white matter ischemic change is seen. No findings to suggest acute hemorrhage, acute infarction or space-occupying mass lesion are noted. Vascular: No hyperdense vessel or unexpected calcification. Skull: Normal. Negative for fracture or focal lesion. Sinuses/Orbits: No acute finding.  Other: None IMPRESSION: Chronic atrophic and ischemic changes. No acute abnormality is noted. Electronically Signed   By: Alcide CleverMark  Lukens M.D.   On: 04/12/2017 16:34   Mr Lumbar Spine W Wo Contrast  Result Date: 04/12/2017 CLINICAL DATA:  Suspected osteomyelitis. Previous lumbar surgery. Back pain. Unable to bear weight. EXAM: MRI LUMBAR SPINE WITHOUT AND WITH CONTRAST TECHNIQUE: Multiplanar and multiecho pulse sequences of the lumbar spine were obtained without and with intravenous contrast. CONTRAST:  15mL MULTIHANCE GADOBENATE DIMEGLUMINE 529 MG/ML IV SOLN COMPARISON:  Plain films earlier today. MRI lumbar spine  04/28/2015. FINDINGS: Segmentation:  Standard. Alignment: Degenerative anterolisthesis L4 on L5, 5 mm, stable from 2016. Vertebrae: Fatty replaced L5 and S1 vertebral bodies, appears similar to priors. Susceptibility artifact due to pedicle screws at L5 and S1 limits further assessment. Bone marrow edema of an acute nature at L3 and L4, related to L3-4 discal hyperintensity. Abnormal enhancement is observed on postcontrast images. L3 and L4 osteomyelitis is likely, given the plain film appearance. Conus medullaris and cauda equina: Conus extends to the L1-L2 level. Conus appears normal. Clumping of lower lumbar nerve roots could represent arachnoiditis. No intradural enhancement. Paraspinal and other soft tissues: Paravertebral soft tissue phlegmon extends into both psoas muscles. Disc levels: L1-L2:  Disc desiccation.  Facet arthropathy.  No impingement. L2-L3: Loss of disc height and signal. Shallow central protrusion. Schmorl's nodes. Posterior element hypertrophy. Mild stenosis. No definite impingement. L3-L4: Loss of interspace height, with abnormal T2 hyperintensity centrally accompanied by postcontrast enhancement, consistent with L3-L4 diskitis. Posterior protrusion of disc material. Facet hypertrophy and ligamentum flavum overgrowth contribute to severe stenosis with BILATERAL L3 and L4 nerve  root impingement. Ventral and dorsal dural enhancement, and enhancement of the ligamentum flava, but no discrete epidural abscess. L4-L5: 5 mm anterolisthesis. Chronic loss of disc height. Uncovering of the disc without protrusion. Posterior element hypertrophy. Moderate to severe stenosis. BILATERAL L4 and L5 nerve root impingement. L5-S1: Apparent solid interbody and posterior arthrodesis. Adequate posterior decompression. No definite impingement. 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 Paravertebral phlegmon extends to both psoas muscles. No discrete epidural abscess. Central protrusion in conjunction with posterior element hypertrophy results in severe stenosis affecting the L3 and L4 nerve roots. Stable 5 mm anterolisthesis at L4-5 with chronic loss of disc height. Posterior element hypertrophy contributes to moderate to severe stenosis with BILATERAL L4 and L5 nerve root impingement. Electronically Signed   By: Elsie Stain M.D.   On: 04/12/2017 18:44   Dg Hips Bilat W Or Wo Pelvis 3-4 Views  Result Date: 04/12/2017 CLINICAL DATA:  Generalized weakness. EXAM: DG HIP (WITH OR WITHOUT PELVIS) 3-4V BILAT COMPARISON:  Abdominal CT 07/22/2015 FINDINGS: Chronic trabecular coarsening and bony expansion affecting the right more than left pelvis and proximal right femur, pagetoid changes. There is extensive fixation hardware to the left iliac wing without acute superimposed finding. No visible fracture as permitted by osteopenia. Lumbosacral posterior decompression and fusion. Severe L4-5 disc degeneration IMPRESSION: 1. No acute finding. 2. 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 3. Postoperative left ilium and lumbosacral junction. 4. Severe L4-5 disc degeneration. Electronically Signed   By: Marnee Spring M.D.   On: 04/12/2017 16:00    Labs:  CBC: Recent Labs    04/12/17 1440  WBC 11.0*  HGB 14.7  HCT  43.6  PLT 182    COAGS: No results for input(s): INR, APTT in the last 8760 hours.  BMP: Recent Labs    04/12/17 1440  NA 142  K 2.9*  CL 105  CO2 27  GLUCOSE 95  BUN 15  CALCIUM 9.4  CREATININE 0.92  GFRNONAA >60  GFRAA >60    LIVER FUNCTION TESTS: No results for input(s): BILITOT, AST, ALT, ALKPHOS, PROT, ALBUMIN in the last 8760 hours.  TUMOR MARKERS: No results for input(s): AFPTM, CEA, CA199, CHROMGRNA in the last 8760 hours.  Assessment and Plan:  76 y.o. male with history of hypertension, coronary artery disease, TIA, COPD, Paget's disease of bone, schizophrenia,  BPH, spinal stenosis, prior back surgery in the remote past in [place] and steroid injection several months ago by Dr. Ollen Bowl who presented to [facility] ED yesterday with complaints of inability to walk as well as severe lower back pain starting this past Monday.  He denies fever.  Laboratory studies reveal markedly elevated CK consistent with rhabdomyolysis, WBC 11.0, hemoglobin 14.7, platelets 182 k, potassium 2.9 -getting replacement , creatinine 0.92. 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  There was paravertebral phlegmon extending to both psoas muscles but no discrete epidural abscess. Request now received from internal medicine for image guided aspiration of L3-4 disc space.  Imaging studies have been reviewed by Dr. Deanne Coffer.  Details/risk of procedure, including but not limited to, internal bleeding, infection, injury to adjacent structures, discussed with patient and sister with their understanding and consent.  He did receive dose of plavix yesterday.    Thank you for this interesting consult.  I greatly enjoyed meeting [place] and look forward to participating in their care.  A copy of this report was sent to the requesting provider on this date.  Electronically Signed: D. Jeananne Rama, PA-C 04/13/2017,  1:16 PM   I spent a total of  30 minutes   in face to face in clinical consultation, greater than 50% of which was counseling/coordinating care for fluoroscopic guided aspiration of L3-4 disc space

## 2017-04-14 ENCOUNTER — Inpatient Hospital Stay (HOSPITAL_COMMUNITY): Payer: Medicare Other

## 2017-04-14 LAB — COMPREHENSIVE METABOLIC PANEL
ALK PHOS: 120 U/L (ref 38–126)
ALT: 28 U/L (ref 17–63)
AST: 61 U/L — AB (ref 15–41)
Albumin: 3 g/dL — ABNORMAL LOW (ref 3.5–5.0)
Anion gap: 9 (ref 5–15)
BUN: 17 mg/dL (ref 6–20)
CALCIUM: 8.4 mg/dL — AB (ref 8.9–10.3)
CHLORIDE: 112 mmol/L — AB (ref 101–111)
CO2: 22 mmol/L (ref 22–32)
CREATININE: 1.18 mg/dL (ref 0.61–1.24)
GFR, EST NON AFRICAN AMERICAN: 57 mL/min — AB (ref >60)
Glucose, Bld: 151 mg/dL — ABNORMAL HIGH (ref 65–99)
Potassium: 3.8 mmol/L (ref 3.5–5.1)
Sodium: 143 mmol/L (ref 135–145)
Total Bilirubin: 1 mg/dL (ref 0.3–1.2)
Total Protein: 6.6 g/dL (ref 6.5–8.1)

## 2017-04-14 LAB — MRSA PCR SCREENING: MRSA BY PCR: NEGATIVE

## 2017-04-14 LAB — TROPONIN I
TROPONIN I: 0.13 ng/mL — AB (ref <0.03)
Troponin I: 0.03 ng/mL (ref <0.03)
Troponin I: 0.04 ng/mL (ref <0.03)

## 2017-04-14 LAB — SEDIMENTATION RATE: SED RATE: 82 mm/h — AB (ref 0–16)

## 2017-04-14 LAB — MAGNESIUM: Magnesium: 1.9 mg/dL (ref 1.7–2.4)

## 2017-04-14 MED ORDER — ALBUTEROL SULFATE (2.5 MG/3ML) 0.083% IN NEBU: 2.5000 mg | INHALATION_SOLUTION | RESPIRATORY_TRACT | Status: DC | PRN

## 2017-04-14 MED ORDER — IOPAMIDOL (ISOVUE-370) INJECTION 76%
INTRAVENOUS | Status: AC
  Filled 2017-04-14: qty 100

## 2017-04-14 NOTE — Progress Notes (Signed)
[facility] PROGRESS NOTE  Norman Davis WUJ:[id number] DOB: Dec 03, 1940 DOA: 04/12/2017 PCP: Kirt Boys, DO  Interim summary and HPI 76 y.o. male, w hx of Schizophrenia, Hypertension, Hyperlipidemia, Pagets, [name], Spinal stenosis, prior back surgery in remote past in [place] and ESI several months ago by Dr. Ollen Bowl presents w c/o inability to walk starting on Monday. He has been afebrile per his sister. Complaining of severe lower back pain. MRI demonstrating spinal stenosis with bilateral root impingement and also L3-L4 diskitis with adjacent osteomyelitis and paravertebral phlegmon that extends into the psoas muscles.    04/14/17 - Patient seen alongside patient's sister. Available records reviewed. No much history from patient. No fever or chills.  Assessment/Plan: 1-lumbar discitis/osteomyelitis - Continue vancomycin and cefepime. -IR has been consulted for phlegmon aspiration (currently waiting on patient to be transferred to [facility]) -Neurosurgery has also been made aware of patient case and they will be examined the patient once he is transferred to [facility] -Continue supportive care, fluid resuscitation, PRN analgesics.  2-rhabdomyolysis/Elevated CPK - Seems to be chronically elevated - Myositis is possibility (further work up on DC) - Monitor CPK level - Will hold statins (?role of statins) - ?role of electrolyte abnormalities  3-hypokalemia and hypomagnesemia -Continue electrolytes repletion and follow trend -Continue to monitor level  4-essential hypertension -Continue Cozaar, amlodipine and as needed hydralazine  5-hx of BPH -continue proscar  6-COPD and rhinitis -stable and well controlled -no wheezing on exam and good O2 sat on RA -will continue flonase, spiriva and PRN albuterol -also continue daily claritin   7-HLD - DC statins (Elevated CPK)  8-hx of schizophrenia -stable and well controlled  9-prior hx of CAD and  TIA -continue plavix -no deficits appreciated -no CP  10-chronic back pain/spinal stenosis  -continue neurontin - DC Mobic    Code Status: Full code Family Communication: No family at bedside Disposition Plan: Patient is currently waiting for bed [facility] to be transfer in order to have further assessment by neurosurgery and anticipated IR evaluation for percutaneous drainage of a phlegmon in his lumbar spine.  Follow culture results, continue IV antibiotics, continue as needed analgesics and fluid resuscitation for rhabdomyolysis.   Consultants:  Neurosurgery  ID  IR  Procedures:  Echo has been order: Pending at this moment  See below for x-ray reports  Antibiotics:  Vancomycin and cefepime 04/12/17  HPI/Subjective: Afebrile, no chest pain, no shortness of breath.  Patient reports significant pain in his lower back and inability to move his legs.  Objective: Vitals:   04/14/17 0856 04/14/17 1202  BP:  (!) 160/105  Pulse:  85  Resp:  11  Temp:  98.8 F (37.1 C)  SpO2: 96% 97%    Intake/Output Summary (Last 24 hours) at 04/14/2017 1354 Last data filed at 04/14/2017 0600 Gross per 24 hour  Intake 1063.33 ml  Output 350 ml  Net 713.33 ml   Filed Weights   04/12/17 1353 04/14/17 0500  Weight: 74.8 kg (165 lb) 72.7 kg (160 lb 4.4 oz)    Exam:   General: Afebrile, no chest pain, no shortness of breath.  Patient reports to have significant back pain and continued to experience difficulty moving his legs.  Cardiovascular: S1 and S2, no rubs, no gallops, no JVD on exam.  Respiratory: Good air movement bilaterally, no wheezing, no crackles; normal respiratory effort.  Abdomen: Soft, nontender, nondistended, positive bowel sounds  Musculoskeletal: Poorly able to wiggle his toes only and not able to leave  his legs against gravity; discomfort to his lower back with minimal leg raise bilaterally.  NEURO - Awake and alert. Power RLE is 1=/5, and LLE  0-1/5.  Data Reviewed: Basic Metabolic Panel: Recent Labs  Lab 04/12/17 1440 04/13/17 2214  NA 142 143  K 2.9* 3.8  CL 105 112*  CO2 27 22  GLUCOSE 95 151*  BUN 15 17  CREATININE 0.92 1.18  CALCIUM 9.4 8.4*  MG 1.5*  --    CBC: Recent Labs  Lab 04/12/17 1440 04/13/17 2214  WBC 11.0* 14.1*  NEUTROABS 8.7*  --   HGB 14.7 12.6*  HCT 43.6 38.4*  MCV 87.6 88.5  PLT 182 157   Cardiac Enzymes: Recent Labs  Lab 04/12/17 2210 04/13/17 2214 04/14/17 0503 04/14/17 0858  CKTOTAL 3,553*  --   --   --   CKMB 4.4  --   --   --   TROPONINI  --  0.03* 0.04* 0.13*   Studies: Dg Ribs Unilateral W/chest Right  Result Date: 04/12/2017 CLINICAL DATA:  Generalized weakness. Fell 2 days ago. RIGHT-sided rib pain. EXAM: RIGHT RIBS AND CHEST - 3+ VIEW COMPARISON:  Chest radiograph 07/29/2015. FINDINGS: No fracture or other bone lesions are seen involving the ribs. There is no evidence of pneumothorax or pleural effusion. 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 Apical bullous change is prominent. No consolidation or edema. Cardiac silhouette normal sized. Tortuosity of the aorta with calcification, consistent with atherosclerosis. IMPRESSION: Changes of COPD.  No acute consolidation or edema. No rib fractures are identified.  There is no pneumothorax. Electronically Signed   By: Elsie Stain M.D.   On: 04/12/2017 16:00   Dg Lumbar Spine Complete  Result Date: 04/12/2017 CLINICAL DATA:  76 y/o male with generalize weakness and not ambulating for 3 days. Recent fall. Low back pain, bilateral hip pain. Prior surgery. EXAM: LUMBAR SPINE - COMPLETE 4+ VIEW COMPARISON:  Lumbar radiographs 09/03/2015. CT Abdomen and Pelvis 07/22/2015. Lumbar MRI 04/28/2015. FINDINGS: Chronic postoperative changes to the left iliac wing, left SI joint, and L5-S1 spinal levels. Chronic IVC filter. Chronic degenerative appearing sclerosis of the  L3 and L4 vertebral bodies, but associated irregular endplate erosion at L3-L4 is new since the 2017 radiographs (image 5). Other lumbar levels appear stable. Visible lower thoracic levels appears stable. IMPRESSION: 1. Chronic L3 and L4 vertebral body sclerosis with erosion of the endplates since May 2017. While this could be degenerative in nature, consider Discitis Osteomyelitis. Lumbar MRI without and with contrast would best evaluate further. 2. Stable postoperative appearance of L5-S1 and the left hemipelvis. 3. Note that prior lumbar MRI was in 2016, and chronic spinal and pelvic hardware plus IVC filter are unchanged since 2014. Electronically Signed   By: Odessa Fleming M.D.   On: 04/12/2017 16:02   Ct Head Wo Contrast  Result Date: 04/12/2017 CLINICAL DATA:  Generalized weakness EXAM: CT HEAD WITHOUT CONTRAST TECHNIQUE: Contiguous axial images were obtained from the base of the skull through the vertex without intravenous contrast. COMPARISON:  07/22/2015 FINDINGS: Brain: Diffuse atrophic and chronic white matter ischemic change is seen. No findings to suggest acute hemorrhage, acute infarction or space-occupying mass lesion are noted. Vascular: No hyperdense vessel or unexpected calcification. Skull: Normal. Negative for fracture or focal lesion. Sinuses/Orbits: No acute finding. Other: None IMPRESSION: Chronic atrophic and ischemic changes. No acute abnormality is noted. Electronically Signed   By: Alcide Clever M.D.   On:  04/12/2017 16:34   Ct Angio Chest Pe W Or Wo Contrast  Result Date: 04/14/2017 CLINICAL DATA:  Elevated D-dimer. EXAM: CT ANGIOGRAPHY CHEST WITH CONTRAST TECHNIQUE: Multidetector CT imaging of the chest was performed using the standard protocol during bolus administration of intravenous contrast. Multiplanar CT image reconstructions and MIPs were obtained to evaluate the vascular anatomy. CONTRAST:  77 mL Isovue 370 intravenous COMPARISON:  04/29/2015 FINDINGS: Cardiovascular:  Satisfactory opacification of the pulmonary arteries to the segmental level. No evidence of pulmonary embolism. Normal heart size. No pericardial effusion. Marked aortic tortuosity. No aneurysm or dissection. Mediastinum/Nodes: No enlarged mediastinal, hilar, or axillary lymph nodes. Thyroid gland, trachea, and esophagus demonstrate no significant findings. Lungs/Pleura: Confluent consolidation in the left lower lobe posteriorly. This is new. It may represent pneumonia. Severe bullous emphysematous disease bilaterally. No pleural effusion. Airways are patent. Upper Abdomen: Air within the bile ducts may relate to prior sphincterotomy. Musculoskeletal: No significant skeletal lesion. Review of the MIP images confirms the above findings. IMPRESSION: 1. Negative for acute pulmonary embolism. 2. Left lower lobe consolidation in the posterior base. This may represent pneumonia superimposed on severe emphysematous disease. 3. Pneumobilia.  Question prior sphincterotomy. Electronically Signed   By: Ellery Plunkaniel R Mitchell M.D.   On: 04/14/2017 03:49   Mr Lumbar Spine W Wo Contrast  Result Date: 04/12/2017 CLINICAL DATA:  Suspected osteomyelitis. Previous lumbar surgery. Back pain. Unable to bear weight. EXAM: MRI LUMBAR SPINE WITHOUT AND WITH CONTRAST TECHNIQUE: Multiplanar and multiecho pulse sequences of the lumbar spine were obtained without and with intravenous contrast. CONTRAST:  15mL MULTIHANCE GADOBENATE DIMEGLUMINE 529 MG/ML IV SOLN COMPARISON:  Plain films earlier today. MRI lumbar spine 04/28/2015. FINDINGS: Segmentation:  Standard. Alignment: Degenerative anterolisthesis L4 on L5, 5 mm, stable from 2016. Vertebrae: Fatty replaced L5 and S1 vertebral bodies, appears similar to priors. Susceptibility artifact due to pedicle screws at L5 and S1 limits further assessment. Bone marrow edema of an acute nature at L3 and L4, related to L3-4 discal hyperintensity. Abnormal enhancement is observed on postcontrast images.  L3 and L4 osteomyelitis is likely, given the plain film appearance. Conus medullaris and cauda equina: Conus extends to the L1-L2 level. Conus appears normal. Clumping of lower lumbar nerve roots could represent arachnoiditis. No intradural enhancement. Paraspinal and other soft tissues: Paravertebral soft tissue phlegmon extends into both psoas muscles. Disc levels: L1-L2:  Disc desiccation.  Facet arthropathy.  No impingement. L2-L3: Loss of disc height and signal. Shallow central protrusion. Schmorl's nodes. Posterior element hypertrophy. Mild stenosis. No definite impingement. L3-L4: Loss of interspace height, with abnormal T2 hyperintensity centrally accompanied by postcontrast enhancement, consistent with L3-L4 diskitis. Posterior protrusion of disc material. Facet hypertrophy and ligamentum flavum overgrowth contribute to severe stenosis with BILATERAL L3 and L4 nerve root impingement. Ventral and dorsal dural enhancement, and enhancement of the ligamentum flava, but no discrete epidural abscess. L4-L5: 5 mm anterolisthesis. Chronic loss of disc height. Uncovering of the disc without protrusion. Posterior element hypertrophy. Moderate to severe stenosis. BILATERAL L4 and L5 nerve root impingement. L5-S1: Apparent solid interbody and posterior arthrodesis. Adequate posterior decompression. No definite impingement. 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 Paravertebral phlegmon extends to both psoas muscles. No discrete epidural abscess. Central protrusion in conjunction with posterior element hypertrophy results in severe stenosis affecting the L3 and L4 nerve roots. Stable 5 mm anterolisthesis at L4-5 with chronic loss of disc height. Posterior element hypertrophy contributes to moderate to severe stenosis with  BILATERAL L4 and L5 nerve root impingement. Electronically Signed   By: Elsie Stain M.D.   On: 04/12/2017 18:44   Dg Swallowing  Func-speech Pathology  Result Date: 04/14/2017 Objective Swallowing Evaluation: Type of Study: MBS-Modified Barium Swallow Study  Patient Details Name: Karen Kinnard MRN: [id number] Date of Birth: 1940-08-23 Today's Date: 04/14/2017 Time: SLP Start Time (ACUTE ONLY): 1028 -SLP Stop Time (ACUTE ONLY): 1050 SLP Time Calculation (min) (ACUTE ONLY): 22 min Past Medical History: Past Medical History: Diagnosis Date . Cholelithiasis  . Fall 07/22/2015 . GERD (gastroesophageal reflux disease)  . H/O urinary retention  . Hypercholesteremia  . Hypertension  . Nephrolithiasis   right . 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 Schizophrenia (HCC)  . Shortness of breath dyspnea  . Small bowel obstruction due to adhesions [facility])  Past Surgical History: Past Surgical History: Procedure Laterality Date . BACK SURGERY   . IR LUMBAR DISC ASPIRATION W/IMG GUIDE  04/13/2017 . LIVER SURGERY   . PELVIC FRACTURE SURGERY   . TOTAL HIP ARTHROPLASTY   HPI: [name] presenting with generalized weakness. CT Head showed chronic atrophy and ischemic changes but no acute findings. MRI lumbar spine shows discitis/osteomyelitis. CXR is suggestive of COPD but wouthout acute consolidation. Pt was transferred to [facility] for neurosurgical consult. He had overt coughing with thin liquids during med administration, therefore he was made NPO pending SLP swallowing evaluation. PMH includes schizophrenia, HTN, HLD, pagets, GERD, spinal stenosis, remote back surgery, and falls. Pt reports a h/o dysphagia ~20 years ago following an accident, but denies any difficulty more recently.  Subjective: pt alert, slow speech, slow responses Assessment / Plan / Recommendation CHL IP CLINICAL IMPRESSIONS 04/14/2017 Clinical Impression Pt has a moderate oral and mild pharyngeal dysphagia with no aspiration observed. He has oral holding and delayed oral transit. Pt often had piecemeal swallowing but would need cues from SLP to swallow the  remaining bolus from his oral cavity. Despite a delay in swallowing, most prolonged with purees/solids, he had only trace and transient penetration with large sips of thin liquids. Recommend initiation of Dys 1 diet and thin liquids with full supervision to help with feeding and cueing for oral clearance. SLP will follow and progress as able. SLP Visit Diagnosis Dysphagia, oropharyngeal phase (R13.12) Attention and concentration deficit following -- Frontal lobe and executive function deficit following -- Impact on safety and function Mild aspiration risk   CHL IP TREATMENT RECOMMENDATION 04/14/2017 Treatment Recommendations Therapy as outlined in treatment plan below   Prognosis 04/14/2017 Prognosis for Safe Diet Advancement Good Barriers to Reach Goals Cognitive deficits Barriers/Prognosis Comment -- CHL IP DIET RECOMMENDATION 04/14/2017 SLP Diet Recommendations Dysphagia 1 (Puree) solids;Thin liquid Liquid Administration via Cup;Straw Medication Administration Crushed with puree Compensations Slow rate;Small sips/bites;Minimize environmental distractions;Follow solids with liquid;Other (Comment) Postural Changes Seated upright at 90 degrees   CHL IP OTHER RECOMMENDATIONS 04/14/2017 Recommended Consults -- Oral Care Recommendations Oral care BID Other Recommendations Have oral suction available   CHL IP FOLLOW UP RECOMMENDATIONS 04/14/2017 Follow up Recommendations (No Data)   CHL IP FREQUENCY AND DURATION 04/14/2017 Speech Therapy Frequency (ACUTE ONLY) min 2x/week Treatment Duration 2 weeks      CHL IP ORAL PHASE 04/14/2017 Oral Phase Impaired Oral - Pudding Teaspoon -- Oral - Pudding Cup -- Oral - Honey Teaspoon -- Oral - Honey Cup -- Oral - Nectar Teaspoon -- Oral - Nectar Cup -- Oral - Nectar Straw -- Oral - Thin Teaspoon --  Oral - Thin Cup Holding of bolus;Delayed oral transit Oral - Thin Straw Holding of bolus;Delayed oral transit Oral - Puree Holding of bolus;Delayed oral transit;Piecemeal swallowing Oral  - Mech Soft Holding of bolus;Delayed oral transit;Piecemeal swallowing;Impaired mastication Oral - Regular -- Oral - Multi-Consistency -- Oral - Pill -- Oral Phase - Comment --  CHL IP PHARYNGEAL PHASE 04/14/2017 Pharyngeal Phase Impaired Pharyngeal- Pudding Teaspoon -- Pharyngeal -- Pharyngeal- Pudding Cup -- Pharyngeal -- Pharyngeal- Honey Teaspoon -- Pharyngeal -- Pharyngeal- Honey Cup -- Pharyngeal -- Pharyngeal- Nectar Teaspoon -- Pharyngeal -- Pharyngeal- Nectar Cup -- Pharyngeal -- Pharyngeal- Nectar Straw -- Pharyngeal -- Pharyngeal- Thin Teaspoon -- Pharyngeal -- Pharyngeal- Thin Cup Delayed swallow initiation-vallecula Pharyngeal -- Pharyngeal- Thin Straw Delayed swallow initiation-vallecula;Penetration/Aspiration during swallow Pharyngeal Material enters airway, remains ABOVE vocal cords then ejected out Pharyngeal- Puree Delayed swallow initiation-vallecula Pharyngeal -- Pharyngeal- Mechanical Soft Delayed swallow initiation-vallecula Pharyngeal -- Pharyngeal- Regular -- Pharyngeal -- Pharyngeal- Multi-consistency -- Pharyngeal -- Pharyngeal- Pill -- Pharyngeal -- Pharyngeal Comment --  CHL IP CERVICAL ESOPHAGEAL PHASE 04/14/2017 Cervical Esophageal Phase WFL Pudding Teaspoon -- Pudding Cup -- Honey Teaspoon -- Honey Cup -- Nectar Teaspoon -- Nectar Cup -- Nectar Straw -- Thin Teaspoon -- Thin Cup -- Thin Straw -- Puree -- Mechanical Soft -- Regular -- Multi-consistency -- Pill -- Cervical Esophageal Comment -- No flowsheet data found. Maxcine Ham 04/14/2017, 11:14 AM  Maxcine Ham, M.A. CCC-SLP [phone]             Dg Hips Bilat W Or Wo Pelvis 3-4 Views  Result Date: 04/12/2017 CLINICAL DATA:  Generalized weakness. EXAM: DG HIP (WITH OR WITHOUT PELVIS) 3-4V BILAT COMPARISON:  Abdominal CT 07/22/2015 FINDINGS: Chronic trabecular coarsening and bony expansion affecting the right more than left pelvis and proximal right femur, pagetoid changes. There is extensive fixation hardware to  the left iliac wing without acute superimposed finding. No visible fracture as permitted by osteopenia. Lumbosacral posterior decompression and fusion. Severe L4-5 disc degeneration IMPRESSION: 1. No acute finding. 2. 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 3. Postoperative left ilium and lumbosacral junction. 4. Severe L4-5 disc degeneration. Electronically Signed   By: Marnee Spring M.D.   On: 04/12/2017 16:00   Ir Lumbar Disc Aspiration W/img Guide  Result Date: 04/13/2017 INDICATION: Severe low back pain.  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 EXAM: LUMBAR DISC ASPIRATION UNDER FLUOROSCOPY MEDICATIONS: Lidocaine 1% subcutaneous ANESTHESIA/SEDATION: Intravenous Fentanyl and Versed were administered as conscious sedation during continuous monitoring of the patient's level of consciousness and physiological / cardiorespiratory status by the radiology RN, with a total moderate sedation time of 10 minutes. PROCEDURE: Informed written consent was obtained from the patient after a thorough discussion of the procedural risks, benefits and alternatives. All questions were addressed. Maximal Sterile Barrier Technique was utilized including caps, mask, sterile gowns, sterile gloves, sterile drape, hand hygiene and skin antiseptic. A timeout was performed prior to the initiation of the procedure. The appropriate L3-4 interspace was identified under fluoroscopy, corresponding to previous cross-sectional imaging. An appropriate skin entry site was determined. After local infiltration with 1% lidocaine, a 17 gauge trocar needle was advanced into the interspace from left posterolateral extraforaminal approach. Needle tip position within the interspace confirmed on biplane images. 2.5 mL thin blood-tinged fluid Were aspirated, sent for the requested laboratory studies. The patient tolerated the procedure well. FLUOROSCOPY TIME:  78 seconds, 52 mGy COMPLICATIONS: None immediate.  IMPRESSION: Technically successful lumbar L3-4 disc aspiration under fluoroscopy. Electronically Signed  By: Corlis Leak  Hassell M.D.   On: 04/13/2017 16:32    Scheduled Meds: . amLODipine  10 mg Oral Daily  . cholecalciferol  2,000 Units Oral Daily  . clopidogrel  75 mg Oral Q breakfast  . doxazosin  8 mg Oral QHS  . finasteride  5 mg Oral Daily  . FLUoxetine  40 mg Oral Daily  . fluticasone  2 spray Each Nare QHS  . folic acid  1 mg Oral Daily  . gabapentin  100 mg Oral QHS  . loratadine  10 mg Oral Daily  . losartan  25 mg Oral Daily  . Melatonin  1 tablet Oral QHS  . meloxicam  7.5 mg Oral Daily  . mirabegron ER  25 mg Oral Daily  . mirtazapine  30 mg Oral QHS  . pantoprazole  40 mg Oral Daily  . pravastatin  40 mg Oral QHS  . senna  1 tablet Oral BID  . tiotropium  18 mcg Inhalation Daily   Continuous Infusions: . sodium chloride 100 mL/hr at 04/14/17 0922  . ceFEPime (MAXIPIME) IV 2 g (04/14/17 1213)  . vancomycin Stopped (04/13/17 1924)    Time spent: 30 minutes   Barnetta ChapelSylvester I Tylor Courtwright  [facility] Pager [phone]. If 7PM-7AM, please contact night-coverage at [email or web address], password [email or web address] 04/14/2017, 1:54 PM  LOS: 2 days

## 2017-04-14 NOTE — Progress Notes (Signed)
CRITICAL VALUE ALERT  Critical Value:  Troponin 0.03  Date & Time Notied:  04/14/17 0015  Provider Notified: Toniann FailKakrakandy, MD  Orders Received/Actions taken: Monitor

## 2017-04-14 NOTE — Progress Notes (Signed)
Modified Barium Swallow Progress Note  Patient Details  Name: Norman Davis MRN: [id number] Date of Birth: 1940-07-12  Today's Date: 04/14/2017  Modified Barium Swallow completed.  Full report located under Chart Review in the Imaging Section.  Brief recommendations include the following:  Clinical Impression  Pt has a moderate oral and mild pharyngeal dysphagia with no aspiration observed. He has oral holding and delayed oral transit. Pt often had piecemeal swallowing but would need cues from SLP to swallow the remaining bolus from his oral cavity. Despite a delay in swallowing, most prolonged with purees/solids, he had only trace and transient penetration with large sips of thin liquids. Recommend initiation of Dys 1 diet and thin liquids with full supervision to help with feeding and cueing for oral clearance. SLP will follow and progress as able.   Swallow Evaluation Recommendations       SLP Diet Recommendations: Dysphagia 1 (Puree) solids;Thin liquid   Liquid Administration via: Cup;Straw   Medication Administration: Crushed with puree   Supervision: Staff to assist with self feeding;Full supervision/cueing for compensatory strategies   Compensations: Slow rate;Small sips/bites;Minimize environmental distractions;Follow solids with liquid;Other (Comment)(check for oral holding)   Postural Changes: Seated upright at 90 degrees   Oral Care Recommendations: Oral care BID   Other Recommendations: Have oral suction available    [name], [name] 04/14/2017,11:14 AM   Maxcine HamLaura Paiewonsky, M.A. CCC-SLP [phone]

## 2017-04-14 NOTE — Care Management Note (Addendum)
Case Management Note  Patient Details  Name: Joya Sanmmette Jolliff MRN: [id number] Date of Birth: 14-Apr-1941  Subjective/Objective:  From home with sister ,who asist him, he uses a cane and a walker, he states he has home oxygen 3 liters but could not tell NCM what company,  he states he has Medication coverage and PCP.  Presents with lumbar Discitis, for swallow study recs dyps 1 diet. Acute CVA, lethargic, per ID will need Rocephin IV q12 for 6 weeks thru 1/27th.     12/18 1300 Letha Capeeborah Malli Falotico RN, BSN- NCM received call from sister, she states patient is not on any oxygen at home at all.  She states she would like for patient to go to a SNF at dc.  NCM informed her that she will have CSW to call her.  NCM gave CSW information.                Action/Plan: NCM will follow for dc needs.   Expected Discharge Date:  (UNKNOWN)               Expected Discharge Plan:  Home w Home Health Services  In-House Referral:     Discharge planning Services  CM Consult  Post Acute Care Choice:    Choice offered to:     DME Arranged:    DME Agency:     HH Arranged:    HH Agency:     Status of Service:  In process, will continue to follow  If discussed at Long Length of Stay Meetings, dates discussed:    Additional Comments:  Leone Havenaylor, Lauren Modisette Clinton, RN 04/14/2017, 12:32 PM

## 2017-04-14 NOTE — Evaluation (Signed)
Clinical/Bedside Swallow Evaluation Patient Details  Name: Norman Davis MRN: [id number] Date of Birth: 11-14-1940  Today's Date: 04/14/2017 Time: SLP Start Time (ACUTE ONLY): [id number] SLP Stop Time (ACUTE ONLY): 0941 SLP Time Calculation (min) (ACUTE ONLY): 23 min  Past Medical History:  Past Medical History:  Diagnosis Date  . Cholelithiasis   . Fall 07/22/2015  . GERD (gastroesophageal reflux disease)   . H/O urinary retention   . Hypercholesteremia   . Hypertension   . Nephrolithiasis    right  . 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 Schizophrenia (HCC)   . Shortness of breath dyspnea   . Small bowel obstruction due to adhesions [facility](HCC)    Past Surgical History:  Past Surgical History:  Procedure Laterality Date  . BACK SURGERY    . IR LUMBAR DISC ASPIRATION W/IMG GUIDE  04/13/2017  . LIVER SURGERY    . PELVIC FRACTURE SURGERY    . TOTAL HIP ARTHROPLASTY     HPI:  [name] presenting with generalized weakness. CT Head showed chronic atrophy and ischemic changes but no acute findings. MRI lumbar spine shows discitis/osteomyelitis. CXR is suggestive of COPD but wouthout acute consolidation. [name]. He had overt coughing with thin liquids during med administration, therefore he was made NPO pending SLP swallowing evaluation. PMH includes schizophrenia, HTN, HLD, pagets, GERD, spinal stenosis, remote back surgery, and falls. Pt reports a h/o dysphagia ~20 years ago following an accident, but denies any difficulty more recently.   Assessment / Plan / Recommendation Clinical Impression  Pt has slow responses to commands, prolonged oral preparation/transit, and needs hand over hand assist for self-feeding. Although he does not swallow to command, he does initiate a spontaneous swallow consistently with POs offered. Wet vocal quality was observed requiring a cued cough to clear. This, given with overt coughing  observed last night, is concerning for possible aspiration. Recommend proceeding with MBS to better assess oropharyngeal function. Will plan for later this morning. SLP Visit Diagnosis: Dysphagia, unspecified (R13.10)    Aspiration Risk  Moderate aspiration risk    Diet Recommendation NPO except meds;Ice chips PRN after oral care   Medication Administration: Crushed with puree    Other  Recommendations Oral Care Recommendations: Oral care QID   Follow up Recommendations (tba)      Frequency and Duration            Prognosis Prognosis for Safe Diet Advancement: Good Barriers to Reach Goals: Cognitive deficits      Swallow Study   General HPI: [name] presenting with generalized weakness. CT Head showed chronic atrophy and ischemic changes but no acute findings. MRI lumbar spine shows discitis/osteomyelitis. CXR is suggestive of COPD but wouthout acute consolidation. [name] consult. He had overt coughing with thin liquids during med administration, therefore he was made NPO pending SLP swallowing evaluation. PMH includes schizophrenia, HTN, HLD, pagets, GERD, spinal stenosis, remote back surgery, and falls. Pt reports a h/o dysphagia ~20 years ago following an accident, but denies any difficulty more recently. Type of Study: Bedside Swallow Evaluation Previous Swallow Assessment: none in chart Diet Prior to this Study: NPO Temperature Spikes Noted: No Respiratory Status: Nasal cannula History of Recent Intubation: No Behavior/Cognition: Alert;Cooperative;Requires cueing Oral Cavity Assessment: Other (comment)(small amount of pooled secretions) Oral Care Completed by SLP: No Oral Cavity - Dentition: Edentulous Self-Feeding Abilities: Total  assist Patient Positioning: Upright in bed Baseline Vocal Quality: Normal Volitional Cough: Weak Volitional Swallow: Unable to elicit    Oral/Motor/Sensory Function Overall Oral  Motor/Sensory Function: Other (comment)(doesn't follow commands to assess; seems symmetrical but slo)   Ice Chips Ice chips: Within functional limits Presentation: Spoon   Thin Liquid Thin Liquid: Impaired Presentation: Cup;Self Fed;Straw Pharyngeal  Phase Impairments: Wet Vocal Quality    Nectar Thick Nectar Thick Liquid: Not tested   Honey Thick Honey Thick Liquid: Not tested   Puree Puree: Impaired Presentation: Spoon Oral Phase Functional Implications: Prolonged oral transit   Solid   GO   Solid: Impaired Oral Phase Functional Implications: Impaired mastication;Prolonged oral transit        [name], Chevonne Bostrom 04/14/2017,9:59 AM  Maxcine HamLaura Paiewonsky, M.A. CCC-SLP [phone]

## 2017-04-14 NOTE — Progress Notes (Signed)
Pharmacy Antibiotic Note Norman Davis is a 76 y.o. male admitted on 04/12/2017 with discitis. Pharmacy following for cefepime and vancomycin.   Plan: 1. Vancomycin 1250mg  IV q24h 2. Cefepime 2gm IV q12h 3. Monitor renal function 4. F/u pending culture data   Height: 5\' 9"  (175.3 cm) Weight: 160 lb 4.4 oz (72.7 kg) IBW/kg (Calculated) : 70.7  Temp (24hrs), Avg:98.1 F (36.7 C), Min:97.8 F (36.6 C), Max:98.3 F (36.8 C)  Recent Labs  Lab 04/12/17 1440 04/12/17 1913 04/13/17 2214  WBC 11.0*  --  14.1*  CREATININE 0.92  --  1.18  LATICACIDVEN  --  0.61  --      Antimicrobials this admission: 12/12 vancomycin >> 12/12 cefepime >>  Microbiology results: 12/12 BCx: ngtd 12/13 IR vertebral disc fluid: px 12/14 MRSA PCR: negative   Thank you for allowing pharmacy to be a part of this patient's care.  Pollyann SamplesAndy March Davis, PharmD, BCPS 04/14/2017, 9:42 AM

## 2017-04-15 ENCOUNTER — Inpatient Hospital Stay (HOSPITAL_COMMUNITY): Payer: Medicare Other

## 2017-04-15 DIAGNOSIS — Z9889 Other specified postprocedural states: Secondary | ICD-10-CM

## 2017-04-15 DIAGNOSIS — B957 Other staphylococcus as the cause of diseases classified elsewhere: Secondary | ICD-10-CM

## 2017-04-15 DIAGNOSIS — M4646 Discitis, unspecified, lumbar region: Secondary | ICD-10-CM

## 2017-04-15 DIAGNOSIS — M4626 Osteomyelitis of vertebra, lumbar region: Principal | ICD-10-CM

## 2017-04-15 DIAGNOSIS — Z886 Allergy status to analgesic agent status: Secondary | ICD-10-CM

## 2017-04-15 DIAGNOSIS — R609 Edema, unspecified: Secondary | ICD-10-CM

## 2017-04-15 DIAGNOSIS — M6008 Infective myositis, other site: Secondary | ICD-10-CM

## 2017-04-15 DIAGNOSIS — Z880 Allergy status to penicillin: Secondary | ICD-10-CM

## 2017-04-15 DIAGNOSIS — F209 Schizophrenia, unspecified: Secondary | ICD-10-CM

## 2017-04-15 LAB — BRAIN NATRIURETIC PEPTIDE: B Natriuretic Peptide: 109.1 pg/mL — ABNORMAL HIGH (ref 0.0–100.0)

## 2017-04-15 LAB — RENAL FUNCTION PANEL
Albumin: 2.4 g/dL — ABNORMAL LOW (ref 3.5–5.0)
Anion gap: 8 (ref 5–15)
BUN: 14 mg/dL (ref 6–20)
CO2: 21 mmol/L — ABNORMAL LOW (ref 22–32)
Calcium: 8.7 mg/dL — ABNORMAL LOW (ref 8.9–10.3)
Chloride: 112 mmol/L — ABNORMAL HIGH (ref 101–111)
Creatinine, Ser: 0.84 mg/dL (ref 0.61–1.24)
GFR calc Af Amer: >60 mL/min (ref >60)
GFR calc non Af Amer: >60 mL/min (ref >60)
Glucose, Bld: 111 mg/dL — ABNORMAL HIGH (ref 65–99)
Phosphorus: 2.9 mg/dL (ref 2.5–4.6)
Potassium: 3.9 mmol/L (ref 3.5–5.1)
Sodium: 141 mmol/L (ref 135–145)

## 2017-04-15 LAB — CK: Total CK: 868 U/L — ABNORMAL HIGH (ref 49–397)

## 2017-04-15 LAB — PREALBUMIN: Prealbumin: 7.1 mg/dL — ABNORMAL LOW (ref 18–38)

## 2017-04-15 MED ORDER — ENSURE ENLIVE PO LIQD
237.0000 mL | Freq: Two times a day (BID) | ORAL | Status: DC
  Administered 2017-04-17 – 2017-04-20 (×6): 237 mL via ORAL

## 2017-04-15 MED ORDER — PRO-STAT SUGAR FREE PO LIQD
30.0000 mL | Freq: Two times a day (BID) | ORAL | Status: DC
  Administered 2017-04-15 – 2017-04-20 (×9): 30 mL via ORAL
  Filled 2017-04-15 (×10): qty 30

## 2017-04-15 NOTE — Progress Notes (Signed)
Initial Nutrition Assessment  DOCUMENTATION CODES:   Not applicable  INTERVENTION:  Ensure Enlive po BID, each supplement provides 350 kcal and 20 grams of protein  Magic cup TID with meals, each supplement provides 290 kcal and 9 grams of protein   Will order 30 mL Prostat BID, each supplement provides 100 kcal and 15 grams of protein.  Patient reports nausea interfering with PO intake. Would recommend scheduled anti-nausea/emetic to help with intake  NUTRITION DIAGNOSIS:  Increased nutrient needs related to acute illness(Severe infection) as evidenced by estimated nutritional requirements for this condition  GOAL:  Patient will meet greater than or equal to 90% of their needs  MONITOR:  PO intake, Supplement acceptance, Labs, Weight trends  REASON FOR ASSESSMENT:  Consult Assessment of nutrition requirement/status  ASSESSMENT:  76 y/o Male PMhx Schizophrenia, HTN/HLD, COPD, Pagets disease of bone, Spinal stenosis, prior back surgery and ESI several months ago. Home nurse called EMS due to acute inability to walk. Imaging revealed L3-L4 diskitis, adjacent L3-L4 osteomyelitis, paravertebral phlegmon extending to both psoas muscles. CK consistent with Rhabdomyolysis. Admitted for IV abx.   Pt is seen lying flat in bed. For the most part, he replies to all question w/ garbled short responses.   Noted patient has eaten 30-40 % of 3 of his meals and 70% of 1 meal.   He says he has had a poor appetite for 3-4 days. The day prior to presentation, he says he only had a snack. He does drink 3 ensures/day at baseline. He says he followed a D1 diet at home as well.   He reports postprandial nausea and he feels it is bad enough that he needs asks for anti-nausea medication. He says he is constipated and has not had a bowel movement in 1 week.    He says his UBW is 168 lbs. His bed weight today  Is 167.7 lbs. There is limited weight history in the chart, last weight was from April 2017  when was in 180s. A couple more recent weights are available via Care Everywhere. One from February of this yeas was 163 lbs. A seperate weight from a hospital admission in March was 123 lbs? Highly suspect this was data entry error.   Physical Exam: He has moderate to severe BUE edema. Pt says this is chronic and not acute? Lower extremities appear well nourished. Abdomen is Soft  The only time patient said complete sentence was when RD mentioned icecream which he was quite excited about. Will add magic cups. He was also agreeable to Ensures. Will add Prostat to help meet high protein needs.   Labs: Glu: 111, Wbc: 14.1, Albumin: 2.4, Prealbumin 7.1 Meds: Vit D, Folate, PPI, Remeron, Senokot, IV abx,   NUTRITION - FOCUSED PHYSICAL EXAM: WDL  Diet Order:  DIET - DYS 1 Room service appropriate? Yes; Fluid consistency: Thin  EDUCATION NEEDS:  No education needs have been identified at this time  Skin: Abrasion to R Elbow  Last BM:  12/12  Height:  Ht Readings from Last 1 Encounters:  04/12/17 5\' 9"  (1.753 m)   Weight:  Wt Readings from Last 1 Encounters:  04/14/17 160 lb 4.4 oz (72.7 kg)   Wt Readings from Last 10 Encounters:  04/14/17 160 lb 4.4 oz (72.7 kg)  08/26/15 187 lb (84.8 kg)  08/03/15 182 lb (82.6 kg)  07/28/15 193 lb (87.5 kg)  07/22/15 182 lb (82.6 kg)  06/15/15 188 lb 12.8 oz (85.6 kg)  05/15/15 193 lb 6.4  oz (87.7 kg)  05/07/15 190 lb 9.6 oz (86.5 kg)  04/28/15 182 lb 5.1 oz (82.7 kg)  03/16/13 212 lb (96.2 kg)   Ideal Body Weight:  72.72 kg  BMI:  Body mass index is 23.67 kg/m.  Estimated Nutritional Needs:  Kcal:  1950-2200 kcals (27-30 kcal/kg bw) Protein:  95-110g Pro (1.3-1.5 g/kg bw) Fluid:  1.9-2.2 L fluid  Christophe LouisNathan Shardee Dieu RD, LDN, CNSC Clinical Nutrition Pager: [phone] 04/15/2017 5:23 PM

## 2017-04-15 NOTE — Progress Notes (Signed)
Spoke with Vernona RiegerLaura RN re PICC will not be placed today.  States family to sign consent and nursing will attempt to obtain consent before 04-16-17

## 2017-04-15 NOTE — Consult Note (Signed)
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  I will continue vancomycin pending antibiotic susceptibility results.  I will request PICC placement in anticipation of at least 6 weeks of antibiotic therapy.  Plan: 1. Continue vancomycin 2. Discontinue cefepime 3. PICC placement  Principal Problem:   Lumbar discitis Active Problems:   Hypokalemia   Essential hypertension   Tachycardia   Scheduled Meds: . amLODipine  10 mg Oral Daily  . cholecalciferol  2,000 Units Oral Daily  . clopidogrel  75 mg Oral Q breakfast  . doxazosin  8 mg Oral QHS  . finasteride  5 mg Oral Daily  . FLUoxetine  40 mg Oral Daily  . fluticasone  2 spray Each Nare QHS  . folic acid  1 mg Oral Daily  . gabapentin  100 mg Oral QHS  . loratadine  10 mg Oral Daily  . losartan  25 mg Oral Daily  . Melatonin  1 tablet Oral QHS  . mirabegron ER  25 mg Oral Daily  . mirtazapine  30 mg Oral QHS  . pantoprazole  40 mg Oral Daily  . senna  1 tablet Oral BID  . tiotropium  18 mcg Inhalation Daily   Continuous Infusions: . ceFEPime (MAXIPIME) IV Stopped (04/15/17 1040)  . vancomycin 1,250 mg (04/15/17 1609)   PRN Meds:.acetaminophen **OR** acetaminophen, albuterol, hydrALAZINE, HYDROcodone-acetaminophen, polyethylene glycol  HPI: Norman Davis is a 76 y.o. male with a history of schizophrenia, chronic pain and degenerative spine disease.  He has had prior lumbar surgeries.  He recently became progressively weak and could not walk leading to admission.  X-ray and MRI revealed L3 discitis, osteomyelitis and bilateral psoas myositis.  He was started on broad empiric antibiotic therapy.  He underwent lumbar aspirate  2 days ago and cultures are growing coagulase-negative staph with susceptibilities pending.   Review of Systems: Review of Systems  Unable to perform ROS: Mental acuity    Past Medical History:  Diagnosis Date  . Cholelithiasis   . Fall 07/22/2015  . GERD (gastroesophageal reflux disease)   . H/O urinary retention   . Hypercholesteremia   . Hypertension   . Nephrolithiasis    right  . 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 Schizophrenia (HCC)   . Shortness of breath dyspnea   . Small bowel obstruction due to adhesions [facility](HCC)     Social History   Tobacco Use  . Smoking status: Former Smoker    Packs/day: 1.00    Types: Cigarettes    Last attempt to quit: 05/02/2000    Years since quitting: 16.9  . Smokeless tobacco: Never Used  Substance Use Topics  . Alcohol use: No  . Drug use: No    Family History  Problem Relation Age of Onset  . Cancer Mother   . Cancer Father    Allergies  Allergen Reactions  . Aspirin     unknown  . Penicillins     Unknown Has patient had a PCN reaction causing immediate rash, facial/tongue/throat swelling, SOB or lightheadedness with hypotension: NO Has patient had a PCN  reaction causing severe rash involving mucus membranes or skin necrosis: NO Has patient had a PCN reaction that required hospitalization NO Has patient had a PCN reaction occurring within the last 10 years: NO If all of the above answers are "NO", then may proceed with Cephalosporin use.    OBJECTIVE: Blood pressure 118/73, pulse 100, temperature (!) 100.8 F (38.2 C), temperature source Oral, resp. rate 18, height 5\' 9"  (1.753 m), weight 160 lb 4.4 oz (72.7 kg), SpO2 100 %.  Physical Exam  Constitutional:  He is resting quietly in bed.  He was able to tell me that he has lower back pain but had difficulty answering other questions.  Cardiovascular: Normal rate and regular rhythm.  No murmur heard. Pulmonary/Chest: Effort normal and breath sounds  normal. He has no wheezes. He has no rales.  Abdominal: Soft. There is no tenderness. There is no rebound.  Musculoskeletal:  Some generalized edema.  Neurological: He is alert.  He appears to have weakness in both lower extremities but effort may be a confounding issue.    Lab Results Lab Results  Component Value Date   WBC 14.1 (H) 04/13/2017   HGB 12.6 (L) 04/13/2017   HCT 38.4 (L) 04/13/2017   MCV 88.5 04/13/2017   PLT 157 04/13/2017    Lab Results  Component Value Date   CREATININE 0.84 04/15/2017   BUN 14 04/15/2017   NA 141 04/15/2017   K 3.9 04/15/2017   CL 112 (H) 04/15/2017   CO2 21 (L) 04/15/2017    Lab Results  Component Value Date   ALT 28 04/13/2017   AST 61 (H) 04/13/2017   ALKPHOS 120 04/13/2017   BILITOT 1.0 04/13/2017     Microbiology: Recent Results (from the past 240 hour(s))  Culture, blood (Routine X 2) w Reflex to ID Panel     Status: None (Preliminary result)   Collection Time: 04/12/17  7:07 PM  Result Value Ref Range Status   Specimen Description BLOOD LEFT ANTECUBITAL  Final   Special Requests   Final    BOTTLES DRAWN AEROBIC AND ANAEROBIC Blood Culture results may not be optimal due to an inadequate volume of blood received in culture bottles   Culture   Final    NO GROWTH 3 DAYS Performed at [facility], [address] [place], [place] [address]    Report Status PENDING  Incomplete  Culture, blood (Routine X 2) w Reflex to ID Panel     Status: None (Preliminary result)   Collection Time: 04/12/17 10:10 PM  Result Value Ref Range Status   Specimen Description BLOOD RIGHT HAND  Final   Special Requests IN PEDIATRIC BOTTLE Blood Culture adequate volume  Final   Culture   Final    NO GROWTH 3 DAYS Performed at [facility], [address] [place], [place] [address]    Report Status PENDING  Incomplete  Body fluid culture     Status: None (Preliminary result)   Collection Time: 04/13/17  4:03 PM  Result Value Ref Range  Status   Specimen Description FLUID INTERVERTEBRAL DISC  Final   Special Requests NONE  Final   Gram Stain   Final    FEW WBC PRESENT,BOTH PMN AND MONONUCLEAR NO ORGANISMS SEEN    Culture   Final    FEW STAPHYLOCOCCUS SPECIES (COAGULASE NEGATIVE) SUSCEPTIBILITIES TO FOLLOW CRITICAL RESULT CALLED TO, READ BACK BY AND VERIFIED WITH: L TURNER,RN AT 1113 04/15/17 BY L BENFIELD CONCERNING GROWTH  ON CULTURE Performed at [facility], [address], [place], [place] [address]    Report Status PENDING  Incomplete  MRSA PCR Screening     Status: None   Collection Time: 04/14/17  3:59 AM  Result Value Ref Range Status   MRSA by PCR NEGATIVE NEGATIVE Final    Comment:        The GeneXpert MRSA Assay (FDA approved for NASAL specimens only), is one component of a comprehensive MRSA colonization surveillance program. It is not intended to diagnose MRSA infection nor to guide or monitor treatment for MRSA infections.     [name], MD [facility] for Infectious Disease [facility] [phone] pager   [phone] cell 04/15/2017, 4:37 PM

## 2017-04-15 NOTE — Progress Notes (Signed)
LUE venous duplex prelim: negative for DVT. SVT noted in the left cephalic vein, at [place] fossa. Farrel DemarkJill Eunice, RDMS, RVT

## 2017-04-15 NOTE — Progress Notes (Addendum)
[facility] PROGRESS NOTE  Rodric Punch DGU:[id number] DOB: 30-Nov-1940 DOA: 04/12/2017 PCP: Kirt Boys, DO  Interim summary and HPI 76 y.o. male, w hx of Schizophrenia, Hypertension, Hyperlipidemia, Pagets, [name], Spinal stenosis, prior back surgery in remote past in [place] and ESI several months ago by Dr. Ollen Bowl presents w c/o inability to walk starting on Monday. He has been afebrile per his sister. Complaining of severe lower back pain. MRI demonstrating spinal stenosis with bilateral root impingement and also L3-L4 diskitis with adjacent osteomyelitis and paravertebral phlegmon that extends into the psoas muscles.    04/15/17 - Patient seen alongside patient's Nurse. Poor P.O. Intake as per Nursing staff. Albumin is 2.4. LUE swelling noted. ECHO revealed normal EF with Grade 1 diastolic dysfunction. Poor historian. CPK is on the downward trend. No fever or chills. Will pursue LUE Doppler US, cardiac BNP, prealbumin and dietician consult. Low albumin may also indicate guarded/poor prognosis. Low threshold to consult Palliative care team. Discussed with Dr. [name],  Infectious Disease consultant. Dr. Orvan Falconer will see the patient later today.  Assessment/Plan: 1-lumbar discitis/osteomyelitis - Continue vancomycin and cefepime. -IR has been consulted for phlegmon aspiration (currently waiting on patient to be transferred to [facility]) -Neurosurgery has also been made aware of patient case and they will be examined the patient once he is transferred to [facility]. -Continue supportive care.  2-rhabdomyolysis/Elevated CPK - On downward trend. - Seems to be chronically elevated - Myositis is possibility (further work up on DC) - Monitor CPK level - Continue to hold statins  3-hypokalemia and hypomagnesemia - Resolved. Continue to monitor.  4-essential hypertension -Continue Cozaar, amlodipine and as needed hydralazine  5-hx of BPH -continue  proscar  6-COPD and rhinitis -stable and well controlled -no wheezing on exam and good O2 sat on RA -will continue flonase, spiriva and PRN albuterol -also continue daily claritin   7-HLD - Hold statins (Elevated CPK)  8-hx of schizophrenia -stable and well controlled  9-prior hx of CAD and TIA -continue plavix -no deficits appreciated -no CP  10-chronic back pain/spinal stenosis  -continue neurontin - DC Mobic    11. LUE Swelling - Vascular US LUE  12. Diastolic Dysfunction - CXR - Check BNP  13. Hypoalbuminemia - Check albumin - Dietician consult - ? Indicates Poor/Guarded prognosis.  Code Status: Full code Family Communication: No family at bedside Disposition Plan: Will depend on hospital course   Consultants:  Neurosurgery  ID  IR  Procedures:  Echo    See below for x-ray reports  Antibiotics:  Vancomycin and cefepime 04/12/17  HPI/Subjective: Afebrile, no chest pain, no shortness of breath.  Swelling LUE    Objective: Vitals:   04/15/17 0908 04/15/17 0923  BP:  132/80  Pulse:  99  Resp:  18  Temp:  99.1 F (37.3 C)  SpO2: 100% 100%    Intake/Output Summary (Last 24 hours) at 04/15/2017 1032 Last data filed at 04/15/2017 0500 Gross per 24 hour  Intake 2050 ml  Output 250 ml  Net 1800 ml   Filed Weights   04/12/17 1353 04/14/17 0500  Weight: 74.8 kg (165 lb) 72.7 kg (160 lb 4.4 oz)    Exam:   General: Afebrile, no chest pain, no shortness of breath.  Patient reports to have significant back pain and continued to experience difficulty moving his legs.  Cardiovascular: S1 and S2, no rubs, no gallops, no JVD on exam.  Respiratory: Good air movement bilaterally, no wheezing, no crackles; normal respiratory effort.  Abdomen: Soft, nontender, nondistended, positive bowel sounds  Musculoskeletal: Swelling LUE.  NEURO - Awake and alert. Power RLE is 1=/5, and LLE 0-1/5.  Data Reviewed: Basic Metabolic Panel: Recent Labs   Lab 04/12/17 1440 04/13/17 2214 04/14/17 1851 04/15/17 0343  NA 142 143  --  141  K 2.9* 3.8  --  3.9  CL 105 112*  --  112*  CO2 27 22  --  21*  GLUCOSE 95 151*  --  111*  BUN 15 17  --  14  CREATININE 0.92 1.18  --  0.84  CALCIUM 9.4 8.4*  --  8.7*  MG 1.5*  --  1.9  --   PHOS  --   --   --  2.9   CBC: Recent Labs  Lab 04/12/17 1440 04/13/17 2214  WBC 11.0* 14.1*  NEUTROABS 8.7*  --   HGB 14.7 12.6*  HCT 43.6 38.4*  MCV 87.6 88.5  PLT 182 157   Cardiac Enzymes: Recent Labs  Lab 04/12/17 2210 04/13/17 2214 04/14/17 0503 04/14/17 0858 04/15/17 0343  CKTOTAL 3,553*  --   --   --  868*  CKMB 4.4  --   --   --   --   TROPONINI  --  0.03* 0.04* 0.13*  --    Studies: Ct Angio Chest Pe W Or Wo Contrast  Result Date: 04/14/2017 CLINICAL DATA:  Elevated D-dimer. EXAM: CT ANGIOGRAPHY CHEST WITH CONTRAST TECHNIQUE: Multidetector CT imaging of the chest was performed using the standard protocol during bolus administration of intravenous contrast. Multiplanar CT image reconstructions and MIPs were obtained to evaluate the vascular anatomy. CONTRAST:  77 mL Isovue 370 intravenous COMPARISON:  04/29/2015 FINDINGS: Cardiovascular: Satisfactory opacification of the pulmonary arteries to the segmental level. No evidence of pulmonary embolism. Normal heart size. No pericardial effusion. Marked aortic tortuosity. No aneurysm or dissection. Mediastinum/Nodes: No enlarged mediastinal, hilar, or axillary lymph nodes. Thyroid gland, trachea, and esophagus demonstrate no significant findings. Lungs/Pleura: Confluent consolidation in the left lower lobe posteriorly. This is new. It may represent pneumonia. Severe bullous emphysematous disease bilaterally. No pleural effusion. Airways are patent. Upper Abdomen: Air within the bile ducts may relate to prior sphincterotomy. Musculoskeletal: No significant skeletal lesion. Review of the MIP images confirms the above findings. IMPRESSION: 1. Negative  for acute pulmonary embolism. 2. Left lower lobe consolidation in the posterior base. This may represent pneumonia superimposed on severe emphysematous disease. 3. Pneumobilia.  Question prior sphincterotomy. Electronically Signed   By: Ellery Plunkaniel R Mitchell M.D.   On: 04/14/2017 03:49   Dg Swallowing Func-speech Pathology  Result Date: 04/14/2017 Objective Swallowing Evaluation: Type of Study: MBS-Modified Barium Swallow Study  Patient Details Name: Norman Davis MRN: [id number] Date of Birth: 25-Feb-1941 Today's Date: 04/14/2017 Time: SLP Start Time (ACUTE ONLY): 1028 -SLP Stop Time (ACUTE ONLY): 1050 SLP Time Calculation (min) (ACUTE ONLY): 22 min Past Medical History: Past Medical History: Diagnosis Date . Cholelithiasis  . Fall 07/22/2015 . GERD (gastroesophageal reflux disease)  . H/O urinary retention  . Hypercholesteremia  . Hypertension  . Nephrolithiasis   right . 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 Schizophrenia (HCC)  . Shortness of breath dyspnea  . Small bowel obstruction due to adhesions [facility](HCC)  Past Surgical History: Past Surgical History: Procedure Laterality Date . BACK SURGERY   . IR LUMBAR DISC ASPIRATION W/IMG GUIDE  04/13/2017 . LIVER SURGERY   . PELVIC FRACTURE SURGERY   . TOTAL HIP ARTHROPLASTY  HPI: [name] presenting with generalized weakness. CT Head showed chronic atrophy and ischemic changes but no acute findings. MRI lumbar spine shows discitis/osteomyelitis. CXR is suggestive of COPD but wouthout acute consolidation. Pt was transferred to [facility] for neurosurgical consult. He had overt coughing with thin liquids during med administration, therefore he was made NPO pending SLP swallowing evaluation. PMH includes schizophrenia, HTN, HLD, pagets, GERD, spinal stenosis, remote back surgery, and falls. Pt reports a h/o dysphagia ~20 years ago following an accident, but denies any difficulty more recently.  Subjective: pt alert, slow speech, slow responses  Assessment / Plan / Recommendation CHL IP CLINICAL IMPRESSIONS 04/14/2017 Clinical Impression Pt has a moderate oral and mild pharyngeal dysphagia with no aspiration observed. He has oral holding and delayed oral transit. Pt often had piecemeal swallowing but would need cues from SLP to swallow the remaining bolus from his oral cavity. Despite a delay in swallowing, most prolonged with purees/solids, he had only trace and transient penetration with large sips of thin liquids. Recommend initiation of Dys 1 diet and thin liquids with full supervision to help with feeding and cueing for oral clearance. SLP will follow and progress as able. SLP Visit Diagnosis Dysphagia, oropharyngeal phase (R13.12) Attention and concentration deficit following -- Frontal lobe and executive function deficit following -- Impact on safety and function Mild aspiration risk   CHL IP TREATMENT RECOMMENDATION 04/14/2017 Treatment Recommendations Therapy as outlined in treatment plan below   Prognosis 04/14/2017 Prognosis for Safe Diet Advancement Good Barriers to Reach Goals Cognitive deficits Barriers/Prognosis Comment -- CHL IP DIET RECOMMENDATION 04/14/2017 SLP Diet Recommendations Dysphagia 1 (Puree) solids;Thin liquid Liquid Administration via Cup;Straw Medication Administration Crushed with puree Compensations Slow rate;Small sips/bites;Minimize environmental distractions;Follow solids with liquid;Other (Comment) Postural Changes Seated upright at 90 degrees   CHL IP OTHER RECOMMENDATIONS 04/14/2017 Recommended Consults -- Oral Care Recommendations Oral care BID Other Recommendations Have oral suction available   CHL IP FOLLOW UP RECOMMENDATIONS 04/14/2017 Follow up Recommendations (No Data)   CHL IP FREQUENCY AND DURATION 04/14/2017 Speech Therapy Frequency (ACUTE ONLY) min 2x/week Treatment Duration 2 weeks      CHL IP ORAL PHASE 04/14/2017 Oral Phase Impaired Oral - Pudding Teaspoon -- Oral - Pudding Cup -- Oral - Honey Teaspoon --  Oral - Honey Cup -- Oral - Nectar Teaspoon -- Oral - Nectar Cup -- Oral - Nectar Straw -- Oral - Thin Teaspoon -- Oral - Thin Cup Holding of bolus;Delayed oral transit Oral - Thin Straw Holding of bolus;Delayed oral transit Oral - Puree Holding of bolus;Delayed oral transit;Piecemeal swallowing Oral - Mech Soft Holding of bolus;Delayed oral transit;Piecemeal swallowing;Impaired mastication Oral - Regular -- Oral - Multi-Consistency -- Oral - Pill -- Oral Phase - Comment --  CHL IP PHARYNGEAL PHASE 04/14/2017 Pharyngeal Phase Impaired Pharyngeal- Pudding Teaspoon -- Pharyngeal -- Pharyngeal- Pudding Cup -- Pharyngeal -- Pharyngeal- Honey Teaspoon -- Pharyngeal -- Pharyngeal- Honey Cup -- Pharyngeal -- Pharyngeal- Nectar Teaspoon -- Pharyngeal -- Pharyngeal- Nectar Cup -- Pharyngeal -- Pharyngeal- Nectar Straw -- Pharyngeal -- Pharyngeal- Thin Teaspoon -- Pharyngeal -- Pharyngeal- Thin Cup Delayed swallow initiation-vallecula Pharyngeal -- Pharyngeal- Thin Straw Delayed swallow initiation-vallecula;Penetration/Aspiration during swallow Pharyngeal Material enters airway, remains ABOVE vocal cords then ejected out Pharyngeal- Puree Delayed swallow initiation-vallecula Pharyngeal -- Pharyngeal- Mechanical Soft Delayed swallow initiation-vallecula Pharyngeal -- Pharyngeal- Regular -- Pharyngeal -- Pharyngeal- Multi-consistency -- Pharyngeal -- Pharyngeal- Pill -- Pharyngeal -- Pharyngeal Comment --  CHL IP CERVICAL ESOPHAGEAL PHASE 04/14/2017 Cervical Esophageal Phase  WFL Pudding Teaspoon -- Pudding Cup -- Honey Teaspoon -- Honey Cup -- Nectar Teaspoon -- Nectar Cup -- Nectar Straw -- Thin Teaspoon -- Thin Cup -- Thin Straw -- Puree -- Mechanical Soft -- Regular -- Multi-consistency -- Pill -- Cervical Esophageal Comment -- No flowsheet data found. [name], Laura 04/14/2017, 11:14 AM  Maxcine HamLaura Paiewonsky, M.A. CCC-SLP [phone]             Ir Lumbar Disc Aspiration W/img Guide  Result Date: 04/13/2017 INDICATION:  Severe low back pain.  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 EXAM: LUMBAR DISC ASPIRATION UNDER FLUOROSCOPY MEDICATIONS: Lidocaine 1% subcutaneous ANESTHESIA/SEDATION: Intravenous Fentanyl and Versed were administered as conscious sedation during continuous monitoring of the patient's level of consciousness and physiological / cardiorespiratory status by the radiology RN, with a total moderate sedation time of 10 minutes. PROCEDURE: Informed written consent was obtained from the patient after a thorough discussion of the procedural risks, benefits and alternatives. All questions were addressed. Maximal Sterile Barrier Technique was utilized including caps, mask, sterile gowns, sterile gloves, sterile drape, hand hygiene and skin antiseptic. A timeout was performed prior to the initiation of the procedure. The appropriate L3-4 interspace was identified under fluoroscopy, corresponding to previous cross-sectional imaging. An appropriate skin entry site was determined. After local infiltration with 1% lidocaine, a 17 gauge trocar needle was advanced into the interspace from left posterolateral extraforaminal approach. Needle tip position within the interspace confirmed on biplane images. 2.5 mL thin blood-tinged fluid Were aspirated, sent for the requested laboratory studies. The patient tolerated the procedure well. FLUOROSCOPY TIME:  78 seconds, 52 mGy COMPLICATIONS: None immediate. IMPRESSION: Technically successful lumbar L3-4 disc aspiration under fluoroscopy. Electronically Signed   By: Corlis Leak  Hassell M.D.   On: 04/13/2017 16:32    Scheduled Meds: . amLODipine  10 mg Oral Daily  . cholecalciferol  2,000 Units Oral Daily  . clopidogrel  75 mg Oral Q breakfast  . doxazosin  8 mg Oral QHS  . finasteride  5 mg Oral Daily  . FLUoxetine  40 mg Oral Daily  . fluticasone  2 spray Each Nare QHS  . folic acid  1 mg Oral Daily  . gabapentin  100 mg Oral QHS  . loratadine  10 mg Oral Daily  . losartan  25 mg  Oral Daily  . Melatonin  1 tablet Oral QHS  . mirabegron ER  25 mg Oral Daily  . mirtazapine  30 mg Oral QHS  . pantoprazole  40 mg Oral Daily  . senna  1 tablet Oral BID  . tiotropium  18 mcg Inhalation Daily   Continuous Infusions: . ceFEPime (MAXIPIME) IV 2 g (04/15/17 1010)  . vancomycin Stopped (04/14/17 1831)    Time spent: 30 minutes   Barnetta ChapelSylvester I Jennalyn Cawley  [facility] Pager [phone]. If 7PM-7AM, please contact night-coverage at [email or web address], password [email or web address] 04/15/2017, 10:32 AM  LOS: 3 days

## 2017-04-16 ENCOUNTER — Inpatient Hospital Stay (HOSPITAL_COMMUNITY): Payer: Medicare Other

## 2017-04-16 DIAGNOSIS — R531 Weakness: Secondary | ICD-10-CM

## 2017-04-16 DIAGNOSIS — R5383 Other fatigue: Secondary | ICD-10-CM

## 2017-04-16 LAB — CREATININE, SERUM
Creatinine, Ser: 0.94 mg/dL (ref 0.61–1.24)
GFR calc Af Amer: >60 mL/min (ref >60)
GFR calc non Af Amer: >60 mL/min (ref >60)

## 2017-04-16 LAB — BODY FLUID CULTURE

## 2017-04-16 LAB — BLOOD GAS, ARTERIAL
Acid-Base Excess: 1.4 mmol/L (ref 0.0–2.0)
Bicarbonate: 25.2 mmol/L (ref 20.0–28.0)
Drawn by: 10552
O2 Content: 2 L/min
O2 Saturation: 97.8 %
Patient temperature: 100
pCO2 arterial: 40 mmHg (ref 32.0–48.0)
pH, Arterial: 7.42 (ref 7.350–7.450)
pO2, Arterial: 109 mmHg — ABNORMAL HIGH (ref 83.0–108.0)

## 2017-04-16 LAB — CBC
HCT: 35.6 % — ABNORMAL LOW (ref 39.0–52.0)
HEMOGLOBIN: 11.4 g/dL — AB (ref 13.0–17.0)
MCH: 28.6 pg (ref 26.0–34.0)
MCHC: 32 g/dL (ref 30.0–36.0)
MCV: 89.4 fL (ref 78.0–100.0)
PLATELETS: 164 10*3/uL (ref 150–400)
RBC: 3.98 MIL/uL — AB (ref 4.22–5.81)
RDW: 15.1 % (ref 11.5–15.5)
WBC: 9.2 10*3/uL (ref 4.0–10.5)

## 2017-04-16 LAB — GLUCOSE, CAPILLARY: Glucose-Capillary: 102 mg/dL — ABNORMAL HIGH (ref 65–99)

## 2017-04-16 LAB — AMMONIA
Ammonia: 25 umol/L (ref 9–35)
Ammonia: 20 umol/L (ref 9–35)

## 2017-04-16 MED ORDER — ORAL CARE MOUTH RINSE
15.0000 mL | Freq: Two times a day (BID) | OROMUCOSAL | Status: DC
  Administered 2017-04-16 – 2017-04-20 (×8): 15 mL via OROMUCOSAL

## 2017-04-16 MED ORDER — SODIUM CHLORIDE 0.9 % IV SOLN
1250.0000 mg | INTRAVENOUS | Status: DC
  Administered 2017-04-16: 1250 mg via INTRAVENOUS
  Filled 2017-04-16 (×2): qty 1250

## 2017-04-16 MED ORDER — GADOBENATE DIMEGLUMINE 529 MG/ML IV SOLN
15.0000 mL | Freq: Once | INTRAVENOUS | Status: AC
  Administered 2017-04-16: 15 mL via INTRAVENOUS

## 2017-04-16 NOTE — Progress Notes (Signed)
Spoke with RN re PICC line.  States pt status has changed, has a fever, is aspirating and now npo.  PICC was ordered for d/c with 6 weeks ABT.  Currently has PIV working well.  RN to follow up with MD re current status and if PICC needed at this time.

## 2017-04-16 NOTE — Progress Notes (Signed)
[facility] PROGRESS NOTE  Norman Davis ZOX:[id number] DOB: 1940/10/30 DOA: 04/12/2017 PCP: Kirt Boysarter, Monica, DO  Interim summary and HPI 76 y.o. male, w hx of Schizophrenia, Hypertension, Hyperlipidemia, Pagets, Genella RifeGerd, Spinal stenosis, [place] back [place] and ESI several months ago by Dr. Ollen BowlHarkins presents w c/o inability to walk starting on Monday. He has been afebrile per his sister. Complaining of severe lower back pain. MRI demonstrating spinal stenosis with bilateral root impingement and also L3-L4 diskitis with adjacent osteomyelitis and paravertebral phlegmon that extends into the psoas muscles.    04/16/17 - Patient seen. Patient reported back pain. Patient's Nurse called back later with concerns for change in mental status and possible facial droop. Code Stroke activated. Patient was seen by the Neurologist. CT head stroke protocol is non revealing for acute CVA. Ammonia level is 25. ABG revealed PH of 7.42, PCO2 of 40 and PO2 of 109. Discussed with Neuro Surgery, Dr. Yetta BarreJones. Will await MRI Head and total spine. Poor use of legs. Poor P.O. Intake as per Nursing staff. Albumin is 2.4. LUE swelling noted, but Doppler US of LUE was negative for DVT, but revealed superficial thrombosis (Cephalic Vein). ECHO revealed normal EF with Grade 1 diastolic dysfunction. Poor historian. CPK is on the downward trend. TMax of 101F, which is new. Low albumin may also indicate guarded/poor prognosis. Low threshold to consult Palliative care team. Infectious disease input is appreciated.  Guarded prognosis.  Assessment/Plan: 1-lumbar discitis/osteomyelitis - Continue vancomycin (Staph coagulase negative). -IR performed phlegmon aspiration. -Neurosurgery input is appreciated. - Await result of total Spine MRI. - ID input is appreciated. -Continue supportive care. -Guarded prognosis.  2-rhabdomyolysis/Elevated CPK - On downward trend. - Seems to be chronically elevated -  Myositis is possibility (further work up on DC) - Monitor CPK level - Continue to hold statins  3-hypokalemia and hypomagnesemia - Resolved. Continue to monitor.  4-essential hypertension -Continue Cozaar, amlodipine and as needed hydralazine  5-hx of BPH -continue proscar  6-COPD and rhinitis -stable and well controlled -no wheezing on exam and good O2 sat on RA -will continue flonase, spiriva and PRN albuterol -also continue daily claritin   7-HLD - Hold statins (Elevated CPK)  8-hx of schizophrenia -stable and well controlled  9-prior hx of CAD and TIA -continue plavix -no deficits appreciated -no CP  10-chronic back pain/spinal stenosis  -continue neurontin - DC Mobic    11. LUE Swelling - Vascular US LUE negative for DVT  12. Diastolic Dysfunction  13. Hypoalbuminemia - Prealbumin - Dietician consult - ? Indicates Poor/Guarded prognosis.  14. Reported Change in mental status - Work up in progress - Normal Ammonia and ABG - Neurology input is appreciated.  Code Status: Full code Family Communication: No family at bedside Disposition Plan: Will depend on hospital course   Consultants:  Neurosurgery  ID  IR  Neurology  Procedures:  Echo    See below for x-ray reports  Antibiotics:  Vancomycin 04/12/17  HPI/Subjective: Reported back pain. New fever noted today with TMax of 101. MRI of total spine pending. Altered mentation reported by Nursing staff, work up is in progress.    Objective: Vitals:   04/16/17 0928 04/16/17 1310  BP: (!) 147/89 (!) 144/91  Pulse: (!) 109 (!) 104  Resp: 20 (!) 26  Temp: 99.5 F (37.5 C) (!) 100.9 F (38.3 C)  SpO2: 99% 100%    Intake/Output Summary (Last 24 hours) at 04/16/2017 1323 Last data filed at 04/16/2017 0519 Gross per 24  hour  Intake 600 ml  Output 500 ml  Net 100 ml   Filed Weights   04/12/17 1353 04/14/17 0500  Weight: 74.8 kg (165 lb) 72.7 kg (160 lb 4.4 oz)     Exam:   General: Awake, not in distress. Patient continues to experience difficulty moving his legs.  Cardiovascular: S1 and S2, no rubs, no gallops, no JVD on exam.  Respiratory: Good air movement bilaterally, no wheezing, no crackles; normal respiratory effort.  Abdomen: Soft, nontender, nondistended, positive bowel sounds  Musculoskeletal: Swelling LUE.  NEURO - Awake. Power RLE is 1=/5, and LLE 0-1/5.  Data Reviewed: Basic Metabolic Panel: Recent Labs  Lab 04/12/17 1440 04/13/17 2214 04/14/17 1851 04/15/17 0343  NA 142 143  --  141  K 2.9* 3.8  --  3.9  CL 105 112*  --  112*  CO2 27 22  --  21*  GLUCOSE 95 151*  --  111*  BUN 15 17  --  14  CREATININE 0.92 1.18  --  0.84  CALCIUM 9.4 8.4*  --  8.7*  MG 1.5*  --  1.9  --   PHOS  --   --   --  2.9   CBC: Recent Labs  Lab 04/12/17 1440 04/13/17 2214 04/16/17 0703  WBC 11.0* 14.1* 9.2  NEUTROABS 8.7*  --   --   HGB 14.7 12.6* 11.4*  HCT 43.6 38.4* 35.6*  MCV 87.6 88.5 89.4  PLT 182 157 164   Cardiac Enzymes: Recent Labs  Lab 04/12/17 2210 04/13/17 2214 04/14/17 0503 04/14/17 0858 04/15/17 0343  CKTOTAL 3,553*  --   --   --  868*  CKMB 4.4  --   --   --   --   TROPONINI  --  0.03* 0.04* 0.13*  --    Studies: Dg Chest Port 1 View  Result Date: 04/16/2017 CLINICAL DATA:  Rhonchi. EXAM: PORTABLE CHEST 1 VIEW COMPARISON:  04/15/2017 FINDINGS: The cardiomediastinal silhouette is unchanged. Aortic atherosclerosis is noted. Emphysema is again noted with asymmetric bullous changes in the right lung apex. Chronic interstitial coarsening is unchanged. There is no evidence of acute airspace consolidation, edema, pleural effusion, or pneumothorax. IMPRESSION: COPD without evidence of acute abnormality. Electronically Signed   By: Sebastian AcheAllen  Grady M.D.   On: 04/16/2017 13:16   Dg Chest Port 1 View  Result Date: 04/15/2017 CLINICAL DATA:  Edema. EXAM: PORTABLE CHEST 1 VIEW COMPARISON:  04/12/2017 FINDINGS: The  heart size is stable. Stable tortuosity of the thoracic aorta. Stable advanced emphysematous lung disease. No acute edema, airspace consolidation or pleural fluid identified. No pneumothorax. IMPRESSION: Stable advanced emphysematous lung disease. Electronically Signed   By: Irish LackGlenn  Yamagata M.D.   On: 04/15/2017 11:57    Scheduled Meds: . amLODipine  10 mg Oral Daily  . cholecalciferol  2,000 Units Oral Daily  . clopidogrel  75 mg Oral Q breakfast  . doxazosin  8 mg Oral QHS  . feeding supplement (ENSURE ENLIVE)  237 mL Oral BID BM  . feeding supplement (PRO-STAT SUGAR FREE 64)  30 mL Oral BID  . finasteride  5 mg Oral Daily  . FLUoxetine  40 mg Oral Daily  . fluticasone  2 spray Each Nare QHS  . folic acid  1 mg Oral Daily  . gabapentin  100 mg Oral QHS  . loratadine  10 mg Oral Daily  . losartan  25 mg Oral Daily  . Melatonin  1 tablet Oral QHS  .  mirabegron ER  25 mg Oral Daily  . mirtazapine  30 mg Oral QHS  . pantoprazole  40 mg Oral Daily  . senna  1 tablet Oral BID  . tiotropium  18 mcg Inhalation Daily   Continuous Infusions: . vancomycin Stopped (04/15/17 1739)    Time spent: 30 minutes   Barnetta Chapel  [facility] Pager [phone]. If 7PM-7AM, please contact night-coverage at [email or web address], password [facility] 04/16/2017, 1:23 PM  LOS: 4 days

## 2017-04-16 NOTE — Progress Notes (Signed)
Patient ID: Norman Davis, male   DOB: 1940-12-15, 76 y.o.   MRN: [id number] I saw this patient in the office in April, he had severe adjacent level stenosis at L3-4 and L4-5 at the time and he wanted no aggressive treatment such as surgical decompression or instrumented fusion at the time. It appears from the record that he has been unable to walk since Monday which was 6 days ago. I found out about his admission about an hour ago. I have seen him briefly and he complains of back pain and falls. He can wiggle his toes on the right and left but really has no other significant functional movement of the lower extremities, and this seems stable based on documentation from 3 days ago. Imaging consistent with L3-4 discitis with abscess and the psoas muscle but no epidural abscess with L3-4 and L4-5 stenosis as seen on his MRI 2 years ago. It is difficult to know why he became acutely weak in his legs but it is probably related to the discitis with psoas involvement. Neurology saw him today and has ordered MRI of the entire neuraxis after a code stroke was called. We will await those images. I do not think based on current imaging that he needs urgent or emergent decompression, and based on my previous interaction with him on not sure he would want it. I will have to discuss this with his family and determine what his goals are.

## 2017-04-16 NOTE — Significant Event (Addendum)
Rapid Response Event Note  Overview:  Called  Time Called: 1300 Arrival Time: 1310 Event Type: Neurologic, Other (Comment)  Initial Focused Assessment:  Patient is lethargic but arousable. Skin is hot to touch.  He will open eyes follows one step commands - moves arms - can resist gravity but equally weak on both sides.  Legs bilaterally can wiggle toes no effort against gravity on either leg - this is not new - this was reason for hospital admission.  He appears to have some facial weakness on the right - he does not attempt to smile when asked therefore unable to see if he can overcome.  His tongue has very mild deviation to the right. No other focal deficits.  He will cough to command - weak but productive - noisy resps - will clear with cough and oral suction.  Rectal temp 100.9  BP 144/91 HR 104 ST - RR 28 O2 sats 100% on 2 liter n/c.  RN Meredith at bedside - she has reported concerns to Dr. Dartha Lodgegbata - orders noted.  CBG 102.   Sharyl NimrodMeredith RN reports patient was more alert and oriented this AM - but weak.     Interventions: Tylenol for temp.  Oral care and repostioned with HOB up.  Chart reviewed - MD updated - orders per MD. Ammonia level drawn.  Stat CT scan ordered - spoke with CT - level one traumas on table now - will scan ASAP. ABG done - WNL - Family in - patient responds appropriatley to them - now oriented - just slow to respond.  Facial weakness less prominent with increased alertness.    Plan of Care (if not transferred):  Close monitoring - RN to accompany to CT scan once called.  RRT to follow.   Event Summary: Name of Physician Notified: Dr. Dartha Lodgegbata at (pta RRT)    at    Outcome: Stayed in room and stabalized  Event End Time: 1350  Delton PrairieBritt, Jenilyn Magana L

## 2017-04-16 NOTE — Progress Notes (Signed)
Spoke with Dr Dartha Lodgegbata re PICC line placement for long term antibiotics at discharge.  Orders to hold on PICC placement and re evaluate on 04-17-17.  Pt status change this shift and to be moved to higher level of care.

## 2017-04-16 NOTE — Progress Notes (Signed)
Patient ID: Norman Davis, male   DOB: [birth year], 76 y.o.   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 He has a history of penicillin allergy of unknown type. He is unable to tell me anything about his allergy history. I will continue vancomycin for now.         Cliffton AstersJohn Tyleigh Mahn, MD [facility] [phone] pager   [phone] cell 04/16/2017, 1:09 PM

## 2017-04-16 NOTE — Progress Notes (Signed)
04/16/2017 patient transfer from  [facility] to 37M he was aware of name, place, time and month. He have skin tear on left AC and small stage 2 on crack of sacrum, feet dry. Bilateral arms swollen. He receive the CHG bath. [facility] Mckinlee Dunk RN.

## 2017-04-16 NOTE — Progress Notes (Signed)
Pt showing increased lethargy, becoming less responsive. Oriented x 4 this AM, when reassessed around noon he was only oriented to person. MD notified, vitals taken, rapid response called.  Rapid response nurse suggested taking rectal temp due to recently elevated temp. See rapid response nurse note  MD requested ABG, Ammonia, and stat CT.   CT called and said they couldn't do stat. MD notified  Patient became more responsive after a couple hours, but right sided facial droop and tongue deviation noted. MD notified. MD called code stroke.  MD requested Pt transferred to to step down unit, pt transferred to 

## 2017-04-16 NOTE — Code Documentation (Signed)
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  RN Sharyl NimrodMeredith reports patient with increased lethargy this pm - reports he was more awake and interactive this AM.  Dr. Dartha Lodgegbata requests code stroke to be called with concern of right facial droop.  Dr. Amada JupiterKirkpatrick responding with me to code stroke.  Patient is lethargic but arouses - slow to respond but answers correctly to age and month.  Follows simple commands.  Resists gravity with upper extremities - equal bilateral weakness noted.  No resistance of gravity to legs bilaterally -not new - since admission.  Mild right facial droop which seems to clear when repositioned.  Speech slow but appropriate.  NIHSS 6 - 3 for each leg - 1 for facial.  CT done.  Returned to room - no acute treatment.  Demonstrates some nuchal rigidity - endorses pain with movement.  Handoff to [employer] IncMeredith RN - patient to move to SDU for closer monitoring.  RRT to follow.

## 2017-04-16 NOTE — Consult Note (Addendum)
Neurology Consultation Reason for Consult: Altered mental status Referring Physician: Ogbata, S  CC: Altered mental status  History is obtained from: Patient, chart  HPI: Norman Davis is a 76 y.o. male admitted with staphylococcal lumbar vertebral infection.  He has been started on vancomycin, received cefepime for multiple days.  Today, he was noted to have increasing lethargy and there is some concern for facial weakness as well and therefore a code stroke was activated.  He has had some degree of weakness for several days, therefore I feel that his time of onset is unclear.  LKW: Unclear tpa given?: no, unclear time of onset   ROS: Unable to obtain due to altered mental status.   Past Medical History:  Diagnosis Date  . Cholelithiasis   . Fall 07/22/2015  . GERD (gastroesophageal reflux disease)   . H/O urinary retention   . Hypercholesteremia   . Hypertension   . Nephrolithiasis    right  . 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 Schizophrenia (HCC)   . Shortness of breath dyspnea   . Small bowel obstruction due to adhesions [facility](HCC)      Family History  Problem Relation Age of Onset  . Cancer Mother   . Cancer Father      Social History:  reports that he quit smoking about 16 years ago. His smoking use included cigarettes. He smoked 1.00 pack per day. he has never used smokeless tobacco. He reports that he does not drink alcohol or use drugs.   Exam: Current vital signs: BP (!) 141/97   Pulse (!) 104   Temp (!) 100.9 F (38.3 C) (Rectal)   Resp (!) 26   Ht 5\' 9"  (1.753 m)   Wt 72.7 kg (160 lb 4.4 oz)   SpO2 99%   BMI 23.67 kg/m  Vital signs in last 24 hours: Temp:  [99.3 F (37.4 C)-101 F (38.3 C)] 100.9 F (38.3 C) (12/16 1310) Pulse Rate:  [101-112] 104 (12/16 1310) Resp:  [20-26] 26 (12/16 1310) BP: (136-160)/(62-97) 141/97 (12/16 1445) SpO2:  [98 %-100 %] 99 % (12/16 1445)   Physical Exam  Constitutional: Appears well-developed  and well-nourished.  Psych: Affect appropriate to situation Eyes: No scleral injection HENT: No OP obstrucion Head: Normocephalic.  Cardiovascular: Normal rate and regular rhythm.  Respiratory: Effort normal, non-labored breathing GI: Soft.  No distension. There is no tenderness.  Skin: WDI  Neuro: Mental Status: Patient is awake, alert, oriented to person,  month, age.  He is lethargic and answer simple yes or no questions, but does not participate much with history taking. Cranial Nerves: II: Visual Fields are full. Pupils are equal, round, and reactive to light.   III,IV, VI: EOMI without ptosis or diploplia.  V: Facial sensation is symmetric to temperature VII: I had the impression that he might have a mild right facial weakness, however with facial repositioning this is less clear. VIII: hearing is intact to voice X: Uvula elevates symmetrically XII: tongue is midline without atrophy or fasciculations.  Motor: He is able to raise both his arms against gravity, but gives poor effort bilaterally, but I feel that he has at least 4+-5 strength bilaterally.  In his legs, he is diffusely weak, including psoas, quadriceps and ankle dorsi/plantarflexion.  Sensory: Sensation is symmetric to light touch and temperature in the arms and legs.  No clear radicular pattern of numbness Deep Tendon Reflexes: He has absent reflexes at the knees, 1+ and symmetric  at the ankles. 2-3+ at the biceps Cerebellar: Does not perform  His neck feels stiff  I have reviewed labs in epic and the results pertinent to this consultation are: Albumin 3.0  I have reviewed the images obtained:MRI L-spine - Osteomyelitis  Impression: 76 yo M with lethargy in the setting of infection. I suspect that the mental status is largely due to toxic/metabolic encephalopathy due to his infection. I am concerned by his neck stiffness, and his leg weakness is not clearly in an L3-4 distribution, though with loss of reflexes  I would favor havin gneurosurgery weigh in on if something needs to be done about the nerve root impingement there. I would favor looking for abscess higher up as well.   With lethargy and question of facial droop I would like to look at the brain also  Recommendations: 1) MRI brain to assess lethargy/possible facial droop.  2) MRI c- and T spine to look for abscess 3) Reassess L-spine to assess for worsening infection.    Ritta SlotMcNeill Erynn Vaca, MD [facility] [phone]  If 7pm- 7am, please page neurology on call as listed in AMION.

## 2017-04-17 DIAGNOSIS — I639 Cerebral infarction, unspecified: Secondary | ICD-10-CM

## 2017-04-17 DIAGNOSIS — M8608 Acute hematogenous osteomyelitis, other sites: Secondary | ICD-10-CM

## 2017-04-17 DIAGNOSIS — R410 Disorientation, unspecified: Secondary | ICD-10-CM

## 2017-04-17 DIAGNOSIS — B957 Other staphylococcus as the cause of diseases classified elsewhere: Secondary | ICD-10-CM

## 2017-04-17 LAB — CULTURE, BLOOD (ROUTINE X 2)
Culture: NO GROWTH
Culture: NO GROWTH
Special Requests: ADEQUATE

## 2017-04-17 MED ORDER — ACETAMINOPHEN 325 MG PO TABS
650.0000 mg | ORAL_TABLET | Freq: Four times a day (QID) | ORAL | Status: DC | PRN
  Administered 2017-04-18 – 2017-04-19 (×3): 650 mg via ORAL
  Filled 2017-04-17: qty 2

## 2017-04-17 MED ORDER — DEXTROSE 5 % IV SOLN
2.0000 g | Freq: Two times a day (BID) | INTRAVENOUS | Status: DC
  Administered 2017-04-17 – 2017-04-20 (×6): 2 g via INTRAVENOUS
  Filled 2017-04-17 (×7): qty 2

## 2017-04-17 NOTE — Progress Notes (Signed)
Patient ID: Norman Davis, male   DOB: 01-Oct-1940, 76 y.o.   MRN: [id number] Patient remains comfortable while lying in bed. He has pain with passive range of motion of the lower extremities. He can wiggle his toes but that's about it in the lower extremities. I can lift his legs off the bed and he has tone to the point that he holds his foot off the bed by extending the knee. I had a long fixation with her sister yesterday and it appears she does not really want to be overly aggressive from a surgical perspective. She states "I just put him in a wheelchair." She does not feel that he would want more surgery. I reviewed his imaging and imaging reports from last night. No change in lumbar MRI. 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

## 2017-04-17 NOTE — Progress Notes (Signed)
Subjective: Feels cold, denies pain.   Antibiotics:  Anti-infectives (From admission, onward)   Start     Dose/Rate Route Frequency Ordered Stop   04/17/17 2000  cefTRIAXone (ROCEPHIN) 2 g in dextrose 5 % 50 mL IVPB     2 g 100 mL/hr over 30 Minutes Intravenous Every 12 hours 04/17/17 1445     04/16/17 2200  vancomycin (VANCOCIN) 1,250 mg in sodium chloride 0.9 % 250 mL IVPB  Status:  Discontinued     1,250 mg 166.7 mL/hr over 90 Minutes Intravenous Every 24 hours 04/16/17 1735 04/17/17 1445   04/13/17 1600  vancomycin (VANCOCIN) 1,250 mg in sodium chloride 0.9 % 250 mL IVPB  Status:  Discontinued     1,250 mg 166.7 mL/hr over 90 Minutes Intravenous Every 24 hours 04/12/17 1957 04/16/17 1735   04/13/17 1000  ceFEPIme (MAXIPIME) 2 g in dextrose 5 % 50 mL IVPB  Status:  Discontinued     2 g 100 mL/hr over 30 Minutes Intravenous Every 12 hours 04/12/17 1957 04/15/17 1646   04/12/17 2030  vancomycin (VANCOCIN) 1,500 mg in sodium chloride 0.9 % 500 mL IVPB     1,500 mg 250 mL/hr over 120 Minutes Intravenous  Once 04/12/17 1928 04/12/17 2345   04/12/17 2000  ceFEPIme (MAXIPIME) 2 g in dextrose 5 % 50 mL IVPB     2 g 100 mL/hr over 30 Minutes Intravenous  Once 04/12/17 1928 04/12/17 2142      Medications: Scheduled Meds: . amLODipine  10 mg Oral Daily  . cholecalciferol  2,000 Units Oral Daily  . clopidogrel  75 mg Oral Q breakfast  . doxazosin  8 mg Oral QHS  . feeding supplement (ENSURE ENLIVE)  237 mL Oral BID BM  . feeding supplement (PRO-STAT SUGAR FREE 64)  30 mL Oral BID  . finasteride  5 mg Oral Daily  . FLUoxetine  40 mg Oral Daily  . fluticasone  2 spray Each Nare QHS  . folic acid  1 mg Oral Daily  . loratadine  10 mg Oral Daily  . losartan  25 mg Oral Daily  . mouth rinse  15 mL Mouth Rinse BID  . Melatonin  1 tablet Oral QHS  . mirabegron ER  25 mg Oral Daily  . pantoprazole  40 mg Oral Daily  . senna  1 tablet Oral BID  . tiotropium  18 mcg  Inhalation Daily   Continuous Infusions: . cefTRIAXone (ROCEPHIN)  IV     PRN Meds:.acetaminophen **OR** acetaminophen, acetaminophen, albuterol, hydrALAZINE, polyethylene glycol    Objective: Weight change:   Intake/Output Summary (Last 24 hours) at 04/17/2017 1512 Last data filed at 04/17/2017 1300 Gross per 24 hour  Intake 970 ml  Output 1000 ml  Net -30 ml   Blood pressure (!) 150/98, pulse 92, temperature 98.1 F (36.7 C), temperature source Oral, resp. rate 13, height 5\' 9"  (1.753 m), weight 156 lb 8.4 oz (71 kg), SpO2 98 %. Temp:  [98.1 F (36.7 C)-99.8 F (37.7 C)] 98.1 F (36.7 C) (12/17 1147) Pulse Rate:  [76-107] 92 (12/17 1147) Resp:  [9-17] 13 (12/17 1147) BP: (140-166)/(81-105) 150/98 (12/17 1147) SpO2:  [96 %-100 %] 98 % (12/17 1147) Weight:  [156 lb 8.4 oz (71 kg)-158 lb 11.7 oz (72 kg)] 156 lb 8.4 oz (71 kg) (12/17 0500)  Physical Exam: General: somnolent, awakens to voice. HEENT: anicteric sclera CVS regular rate, normal rhythm,  no murmur rubs or gallops  Lungs: coarse upper airway sounds Abdomen: soft  Extremities: edema upper extremities Neuro: wiggles toes  CBC: @LABBLAST3 (wbc3,Hgb:3,Hct:3,Plt:3,INR:3APTT:3)@   BMET Recent Labs    04/15/17 0343 04/16/17 1632  NA 141  --   K 3.9  --   CL 112*  --   CO2 21*  --   GLUCOSE 111*  --   BUN 14  --   CREATININE 0.84 0.94  CALCIUM 8.7*  --      Liver Panel  Recent Labs    04/15/17 0343  ALBUMIN 2.4*       Sedimentation Rate No results for input(s): ESRSEDRATE in the last 72 hours. C-Reactive Protein No results for input(s): CRP in the last 72 hours.  Micro Results: Recent Results (from the past 720 hour(s))  Culture, blood (Routine X 2) w Reflex to ID Panel     Status: None   Collection Time: 04/12/17  7:07 PM  Result Value Ref Range Status   Specimen Description BLOOD LEFT ANTECUBITAL  Final   Special Requests   Final    BOTTLES DRAWN AEROBIC AND ANAEROBIC Blood Culture  results may not be optimal due to an inadequate volume of blood received in culture bottles   Culture   Final    NO GROWTH 5 DAYS Performed at [facility], [address], [place], [place] [address]    Report Status 04/17/2017 FINAL  Final  Culture, blood (Routine X 2) w Reflex to ID Panel     Status: None   Collection Time: 04/12/17 10:10 PM  Result Value Ref Range Status   Specimen Description BLOOD RIGHT HAND  Final   Special Requests IN PEDIATRIC BOTTLE Blood Culture adequate volume  Final   Culture   Final    NO GROWTH 5 DAYS Performed at [facility], [address], [place], [place] [address]    Report Status 04/17/2017 FINAL  Final  Body fluid culture     Status: None   Collection Time: 04/13/17  4:03 PM  Result Value Ref Range Status   Specimen Description FLUID INTERVERTEBRAL DISC  Final   Special Requests NONE  Final   Gram Stain   Final    FEW WBC PRESENT,BOTH PMN AND MONONUCLEAR NO ORGANISMS SEEN    Culture   Final    FEW STAPHYLOCOCCUS SPECIES (COAGULASE NEGATIVE) NO ANAEROBES ISOLATED CRITICAL RESULT CALLED TO, READ BACK BY AND VERIFIED WITH: L TURNER,RN AT 1113 04/15/17 BY L BENFIELD CONCERNING GROWTH ON CULTURE Performed at [facility], [address], [place], [place] [address]    Report Status 04/16/2017 FINAL  Final   Organism ID, Bacteria STAPHYLOCOCCUS SPECIES (COAGULASE NEGATIVE)  Final      Susceptibility   Staphylococcus species (coagulase negative) - MIC*    CIPROFLOXACIN INTERMEDIATE Intermediate     ERYTHROMYCIN >=8 RESISTANT Resistant     GENTAMICIN <=0.5 SENSITIVE Sensitive     OXACILLIN <=0.25 SENSITIVE Sensitive     TETRACYCLINE <=1 SENSITIVE Sensitive     VANCOMYCIN <=0.5 SENSITIVE Sensitive     TRIMETH/SULFA <=10 SENSITIVE Sensitive     CLINDAMYCIN RESISTANT Resistant     RIFAMPIN <=0.5 SENSITIVE Sensitive     Inducible Clindamycin POSITIVE Resistant     * FEW STAPHYLOCOCCUS SPECIES (COAGULASE NEGATIVE)  MRSA PCR  Screening     Status: None   Collection Time: 04/14/17  3:59 AM  Result Value Ref Range Status   MRSA by PCR NEGATIVE NEGATIVE Final    Comment:  The GeneXpert MRSA Assay (FDA approved for NASAL specimens only), is one component of a comprehensive MRSA colonization surveillance program. It is not intended to diagnose MRSA infection nor to guide or monitor treatment for MRSA infections.     Studies/Results: Mr Laqueta Jean ZO Contrast  Result Date: 04/16/2017 CLINICAL DATA:  Lethargy. Facial droop. 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 EXAM: MRI HEAD WITHOUT AND WITH CONTRAST MRI CERVICAL SPINE WITHOUT AND WITH CONTRAST MRI THORACIC SPINE WITHOUT AND WITH CONTRAST MRI LUMBAR SPINE WITHOUT AND WITH CONTRAST TECHNIQUE: Multiplanar, multiecho pulse sequences of the brain and surrounding structures, cervical spine, to include the craniocervical junction and cervicothoracic junction, and thoracic and lumbar spine were obtained without and with intravenous contrast. CONTRAST:  15mL MULTIHANCE GADOBENATE DIMEGLUMINE 529 MG/ML IV SOLN COMPARISON:  Head CT 04/16/2017. Brain MRI 07/22/2015. Chest CTA 04/14/2017. Lumbar spine MRI 04/12/2017. Cervical spine CT 07/21/2015. 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 There is no evidence of acute supratentorial infarct. A chronic microhemorrhage in the left temporal lobe is unchanged from the prior MRI. No mass, midline shift, or extra-axial fluid collection is seen. There is moderate cerebral atrophy. Patchy T2 hyperintensities in the cerebral white matter and pons are similar to the prior MRI and nonspecific but compatible with moderate chronic small vessel ischemic disease. 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  No abnormal enhancement is identified, however postcontrast imaging is motion degraded, severely so on the axial sequence. Vascular: Major intracranial vascular flow voids are preserved. 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 Skull and upper cervical spine: Unremarkable bone marrow signal. Sinuses/Orbits: Left cataract extraction. Moderate left ethmoid air cell mucosal thickening. Mild left frontal sinus mucosal thickening. Moderate right mastoid effusion. Other: None. MRI CERVICAL SPINE FINDINGS Multiple sequences are moderately motion degraded. Alignment: Cervical spine straightening. At most minimal retrolisthesis of C6 on C7 and minimal anterolisthesis of C7 on T1. Slight left convex curvature of the cervical spine. Vertebrae: No evidence of fracture or air discitis/ osteomyelitis. Multilevel degenerative marrow changes, greatest along the C5 inferior endplate. No epidural fluid collection. Cord: Normal signal and morphology. No abnormal intradural enhancement. Posterior Fossa, vertebral arteries, paraspinal tissues: Grossly preserved vertebral artery flow voids in the upper neck. No paraspinal fluid collection. Disc levels: Moderate to severe disc space narrowing throughout the cervical spine with exception of C7-T1. C2-3: Mild uncovertebral spurring and mild right facet arthrosis without significant stenosis. C3-4: Uncovertebral spurring and minimal disc bulging without stenosis. C4-5: Broad-based posterior disc osteophyte complex results in mild-to-moderate left neural foraminal stenosis and mild spinal stenosis. C5-6: Broad-based posterior disc osteophyte complex results in moderate bilateral neural foraminal stenosis without significant spinal stenosis. C6-7: Broad-based posterior disc osteophyte complex results in borderline to mild spinal stenosis and severe bilateral neural foraminal stenosis. C7-T1: Moderate right facet arthrosis result in moderate to severe right neural foraminal stenosis.  No spinal stenosis. MRI THORACIC SPINE FINDINGS Multiple sequences are moderately motion degraded. Alignment:  Normal. Vertebrae: No evidence of fracture or discitis. No suspicious osseous lesion. No epidural fluid collection. Cord: Normal signal and morphology. No abnormal intradural enhancement. Paraspinal and other soft tissues: Small bilateral pleural effusions. Disc levels: Preserved disc space heights throughout the thoracic spine. No sizable disc herniation identified. Widely patent spinal canal and neural foramina throughout.  MRI LUMBAR SPINE FINDINGS The study is mildly to moderately motion degraded. Segmentation:  Standard. Alignment: Unchanged trace retrolisthesis of L2 on L3, grade 1 retrolisthesis of L3 on L4, and grade 1 anterolisthesis of L4 on L5. Vertebrae: Prior L5-S1 PLIF with stable fatty marrow changes. Similar appearance of abnormal fluid signal throughout the L3-4 disc space, which is collapsed, with erosive changes and edema and enhancement about both endplates. Chronic severe disc space height loss at L4-5 without changes suggestive of discitis/ osteomyelitis at this level. No evidence of new lumbar spine infection. Conus medullaris: Extends to the L1-2 level and appears normal. Clumped appearance of the cauda equina nerve roots in the lower lumbar spine which could reflect arachnoiditis or be related to high-grade spinal stenosis more proximally. Paraspinal and other soft tissues: Similar appearance of bilateral paravertebral phlegmon at L3-4 involving both psoas muscles. No evidence of drainable fluid collection. Disc levels: Similar appearance of degenerative and postoperative changes, with detailed assessment deferred to the recent prior examination. Motion artifact on axial imaging limits assessment. Epidural enhancement is again noted at L3-4 without a discrete abscess identified. Multifactorial spinal stenosis is moderate at L2-3, severe at L3-4, and moderate to severe at L4-5. There  is severe bilateral neural foraminal stenosis at L3-4. IMPRESSION: 1. 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 2. 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 3. No evidence of infection in the cervical or thoracic spine. 4. 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 5. Multilevel cervical neural foraminal stenosis, severe bilaterally at C6-7. 6. 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 Severe spinal stenosis. 7. No evidence of new infection in the lumbar spine. Electronically Signed   By: Sebastian AcheAllen  Grady M.D.   On: 04/16/2017 21:10   Mr Cervical Spine W Wo Contrast  Result Date: 04/16/2017 CLINICAL DATA:  Lethargy. Facial droop. 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 EXAM: MRI HEAD WITHOUT AND WITH CONTRAST MRI CERVICAL SPINE WITHOUT AND WITH CONTRAST MRI THORACIC SPINE WITHOUT AND WITH CONTRAST MRI LUMBAR SPINE WITHOUT AND WITH CONTRAST TECHNIQUE: Multiplanar, multiecho pulse sequences of the brain and surrounding structures, cervical spine, to include the craniocervical junction and cervicothoracic junction, and thoracic and lumbar spine were obtained without and with intravenous contrast. CONTRAST:  15mL MULTIHANCE GADOBENATE DIMEGLUMINE 529 MG/ML IV SOLN COMPARISON:  Head CT 04/16/2017. Brain MRI 07/22/2015. Chest CTA 04/14/2017. Lumbar spine MRI 04/12/2017. Cervical spine CT 07/21/2015. 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 There is no evidence of acute supratentorial infarct. A chronic microhemorrhage in the left temporal lobe is unchanged from the prior MRI. No mass, midline shift, or extra-axial fluid collection is seen. There is moderate cerebral atrophy. Patchy T2 hyperintensities in the cerebral white matter and pons are similar to the prior MRI and  nonspecific but compatible with moderate chronic small vessel ischemic disease. 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 No abnormal enhancement is identified, however postcontrast imaging is motion degraded, severely so on the axial sequence. Vascular: Major intracranial vascular flow voids are preserved. 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 Skull and upper cervical spine: Unremarkable bone marrow signal. Sinuses/Orbits: Left cataract extraction. Moderate left ethmoid air cell mucosal thickening. Mild left frontal sinus mucosal  thickening. Moderate right mastoid effusion. Other: None. MRI CERVICAL SPINE FINDINGS Multiple sequences are moderately motion degraded. Alignment: Cervical spine straightening. At most minimal retrolisthesis of C6 on C7 and minimal anterolisthesis of C7 on T1. Slight left convex curvature of the cervical spine. Vertebrae: No evidence of fracture or air discitis/ osteomyelitis. Multilevel degenerative marrow changes, greatest along the C5 inferior endplate. No epidural fluid collection. Cord: Normal signal and morphology. No abnormal intradural enhancement. Posterior Fossa, vertebral arteries, paraspinal tissues: Grossly preserved vertebral artery flow voids in the upper neck. No paraspinal fluid collection. Disc levels: Moderate to severe disc space narrowing throughout the cervical spine with exception of C7-T1. C2-3: Mild uncovertebral spurring and mild right facet arthrosis without significant stenosis. C3-4: Uncovertebral spurring and minimal disc bulging without stenosis. C4-5: Broad-based posterior disc osteophyte complex results in mild-to-moderate left neural foraminal stenosis and mild spinal stenosis. C5-6: Broad-based posterior disc osteophyte complex results in moderate bilateral neural foraminal  stenosis without significant spinal stenosis. C6-7: Broad-based posterior disc osteophyte complex results in borderline to mild spinal stenosis and severe bilateral neural foraminal stenosis. C7-T1: Moderate right facet arthrosis result in moderate to severe right neural foraminal stenosis. No spinal stenosis. MRI THORACIC SPINE FINDINGS Multiple sequences are moderately motion degraded. Alignment:  Normal. Vertebrae: No evidence of fracture or discitis. No suspicious osseous lesion. No epidural fluid collection. Cord: Normal signal and morphology. No abnormal intradural enhancement. Paraspinal and other soft tissues: Small bilateral pleural effusions. Disc levels: Preserved disc space heights throughout the thoracic spine. No sizable disc herniation identified. Widely patent spinal canal and neural foramina throughout. MRI LUMBAR SPINE FINDINGS The study is mildly to moderately motion degraded. Segmentation:  Standard. Alignment: Unchanged trace retrolisthesis of L2 on L3, grade 1 retrolisthesis of L3 on L4, and grade 1 anterolisthesis of L4 on L5. Vertebrae: Prior L5-S1 PLIF with stable fatty marrow changes. Similar appearance of abnormal fluid signal throughout the L3-4 disc space, which is collapsed, with erosive changes and edema and enhancement about both endplates. Chronic severe disc space height loss at L4-5 without changes suggestive of discitis/ osteomyelitis at this level. No evidence of new lumbar spine infection. Conus medullaris: Extends to the L1-2 level and appears normal. Clumped appearance of the cauda equina nerve roots in the lower lumbar spine which could reflect arachnoiditis or be related to high-grade spinal stenosis more proximally. Paraspinal and other soft tissues: Similar appearance of bilateral paravertebral phlegmon at L3-4 involving both psoas muscles. No evidence of drainable fluid collection. Disc levels: Similar appearance of degenerative and postoperative changes, with detailed  assessment deferred to the recent prior examination. Motion artifact on axial imaging limits assessment. Epidural enhancement is again noted at L3-4 without a discrete abscess identified. Multifactorial spinal stenosis is moderate at L2-3, severe at L3-4, and moderate to severe at L4-5. There is severe bilateral neural foraminal stenosis at L3-4. IMPRESSION: 1. 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 2. 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 3. No evidence of infection in the cervical or thoracic spine. 4. 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 5. Multilevel cervical neural foraminal stenosis, severe bilaterally at C6-7. 6. 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 Severe spinal stenosis. 7. No evidence of new infection in the lumbar spine. Electronically Signed   By: Sebastian Ache M.D.   On: 04/16/2017 21:10   Mr Thoracic Spine W Wo Contrast  Result Date: 04/16/2017 CLINICAL DATA:  Lethargy. Facial droop. 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 EXAM: MRI HEAD WITHOUT AND WITH CONTRAST MRI CERVICAL SPINE WITHOUT AND WITH CONTRAST MRI THORACIC SPINE WITHOUT AND WITH CONTRAST MRI LUMBAR SPINE WITHOUT AND WITH CONTRAST TECHNIQUE: Multiplanar, multiecho pulse sequences of the brain and surrounding structures, cervical spine, to include the craniocervical junction and cervicothoracic junction, and thoracic and lumbar spine were obtained without and with intravenous contrast. CONTRAST:  15mL MULTIHANCE GADOBENATE DIMEGLUMINE 529 MG/ML IV SOLN COMPARISON:  Head CT 04/16/2017. Brain MRI 07/22/2015. Chest CTA 04/14/2017. Lumbar spine MRI 04/12/2017. Cervical spine CT 07/21/2015. 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 There is no evidence of acute supratentorial infarct.  A chronic microhemorrhage in the left temporal lobe is unchanged from the prior MRI. No mass, midline shift, or extra-axial fluid collection is seen. There is moderate cerebral atrophy. Patchy T2 hyperintensities in the cerebral white matter and pons are similar to the prior MRI and nonspecific but compatible with moderate chronic small vessel ischemic disease. 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 No abnormal enhancement is identified, however postcontrast imaging is motion degraded, severely so on the axial sequence. Vascular: Major intracranial vascular flow voids are preserved. 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 Skull and upper cervical spine: Unremarkable bone marrow signal. Sinuses/Orbits: Left cataract extraction. Moderate left ethmoid air cell mucosal thickening. Mild left frontal sinus mucosal thickening. Moderate right mastoid effusion. Other: None. MRI CERVICAL SPINE FINDINGS Multiple sequences are moderately motion degraded. Alignment: Cervical spine straightening. At most minimal retrolisthesis of C6 on C7 and minimal anterolisthesis of C7 on T1. Slight left convex curvature of the cervical spine. Vertebrae: No evidence of fracture or air discitis/ osteomyelitis. Multilevel degenerative marrow changes, greatest along the C5 inferior endplate. No epidural fluid collection. Cord: Normal signal and morphology. No abnormal intradural enhancement. Posterior Fossa, vertebral arteries, paraspinal tissues: Grossly preserved vertebral artery flow voids in the upper neck. No paraspinal fluid collection. Disc levels: Moderate to severe disc space narrowing throughout the cervical spine with exception of C7-T1. C2-3: Mild uncovertebral spurring and mild right facet arthrosis without significant stenosis. C3-4: Uncovertebral  spurring and minimal disc bulging without stenosis. C4-5: Broad-based posterior disc osteophyte complex results in mild-to-moderate left neural foraminal stenosis and mild spinal stenosis. C5-6: Broad-based posterior disc osteophyte complex results in moderate bilateral neural foraminal stenosis without significant spinal stenosis. C6-7: Broad-based posterior disc osteophyte complex results in borderline to mild spinal stenosis and severe bilateral neural foraminal stenosis. C7-T1: Moderate right facet arthrosis result in moderate to severe right neural foraminal stenosis. No spinal stenosis. MRI THORACIC SPINE FINDINGS Multiple sequences are moderately motion degraded. Alignment:  Normal. Vertebrae: No evidence of fracture or discitis. No suspicious osseous lesion. No epidural fluid collection. Cord: Normal signal and morphology. No abnormal intradural enhancement. Paraspinal and other soft tissues: Small bilateral pleural effusions. Disc levels: Preserved disc space heights throughout the thoracic spine. No sizable disc herniation identified. Widely patent spinal canal and neural foramina throughout. MRI LUMBAR SPINE FINDINGS The study is mildly to moderately motion degraded. Segmentation:  Standard. Alignment: Unchanged trace retrolisthesis of L2 on L3, grade 1 retrolisthesis of L3 on L4, and grade 1 anterolisthesis of L4 on L5. Vertebrae: Prior L5-S1 PLIF with stable fatty marrow changes. Similar appearance of abnormal fluid signal throughout  the L3-4 disc space, which is collapsed, with erosive changes and edema and enhancement about both endplates. Chronic severe disc space height loss at L4-5 without changes suggestive of discitis/ osteomyelitis at this level. No evidence of new lumbar spine infection. Conus medullaris: Extends to the L1-2 level and appears normal. Clumped appearance of the cauda equina nerve roots in the lower lumbar spine which could reflect arachnoiditis or be related to high-grade spinal  stenosis more proximally. Paraspinal and other soft tissues: Similar appearance of bilateral paravertebral phlegmon at L3-4 involving both psoas muscles. No evidence of drainable fluid collection. Disc levels: Similar appearance of degenerative and postoperative changes, with detailed assessment deferred to the recent prior examination. Motion artifact on axial imaging limits assessment. Epidural enhancement is again noted at L3-4 without a discrete abscess identified. Multifactorial spinal stenosis is moderate at L2-3, severe at L3-4, and moderate to severe at L4-5. There is severe bilateral neural foraminal stenosis at L3-4. IMPRESSION: 1. 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 2. 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 3. No evidence of infection in the cervical or thoracic spine. 4. 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 5. Multilevel cervical neural foraminal stenosis, severe bilaterally at C6-7. 6. 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 Severe spinal stenosis. 7. No evidence of new infection in the lumbar spine. Electronically Signed   By: Sebastian AcheAllen  Grady M.D.   On: 04/16/2017 21:10   Mr Lumbar Spine W Wo Contrast  Result Date: 04/16/2017 CLINICAL DATA:  Lethargy. Facial droop. 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 EXAM: MRI HEAD WITHOUT AND WITH CONTRAST MRI CERVICAL SPINE WITHOUT AND WITH CONTRAST MRI THORACIC SPINE WITHOUT AND WITH CONTRAST MRI LUMBAR SPINE WITHOUT AND WITH CONTRAST TECHNIQUE: Multiplanar, multiecho pulse sequences of the brain and surrounding structures, cervical spine, to include the craniocervical junction and cervicothoracic junction, and thoracic and lumbar spine were obtained without and with intravenous contrast. CONTRAST:  15mL MULTIHANCE GADOBENATE DIMEGLUMINE 529 MG/ML IV SOLN COMPARISON:   Head CT 04/16/2017. Brain MRI 07/22/2015. Chest CTA 04/14/2017. Lumbar spine MRI 04/12/2017. Cervical spine CT 07/21/2015. 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 There is no evidence of acute supratentorial infarct. A chronic microhemorrhage in the left temporal lobe is unchanged from the prior MRI. No mass, midline shift, or extra-axial fluid collection is seen. There is moderate cerebral atrophy. Patchy T2 hyperintensities in the cerebral white matter and pons are similar to the prior MRI and nonspecific but compatible with moderate chronic small vessel ischemic disease. 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 No abnormal enhancement is identified, however postcontrast imaging is motion degraded, severely so on the axial sequence. Vascular: Major intracranial vascular flow voids are preserved. 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 Skull and upper cervical spine: Unremarkable bone marrow signal. Sinuses/Orbits: Left cataract extraction. Moderate left ethmoid air cell mucosal thickening. Mild left frontal sinus mucosal thickening. Moderate right mastoid effusion. Other: None. MRI CERVICAL SPINE FINDINGS Multiple sequences are moderately motion degraded. Alignment: Cervical spine straightening. At most minimal retrolisthesis of C6 on C7 and minimal anterolisthesis of C7 on T1. Slight left convex curvature of the cervical spine. Vertebrae: No evidence of fracture or air discitis/ osteomyelitis. Multilevel  degenerative marrow changes, greatest along the C5 inferior endplate. No epidural fluid collection. Cord: Normal signal and morphology. No abnormal intradural enhancement. Posterior Fossa, vertebral arteries, paraspinal tissues: Grossly preserved vertebral artery flow  voids in the upper neck. No paraspinal fluid collection. Disc levels: Moderate to severe disc space narrowing throughout the cervical spine with exception of C7-T1. C2-3: Mild uncovertebral spurring and mild right facet arthrosis without significant stenosis. C3-4: Uncovertebral spurring and minimal disc bulging without stenosis. C4-5: Broad-based posterior disc osteophyte complex results in mild-to-moderate left neural foraminal stenosis and mild spinal stenosis. C5-6: Broad-based posterior disc osteophyte complex results in moderate bilateral neural foraminal stenosis without significant spinal stenosis. C6-7: Broad-based posterior disc osteophyte complex results in borderline to mild spinal stenosis and severe bilateral neural foraminal stenosis. C7-T1: Moderate right facet arthrosis result in moderate to severe right neural foraminal stenosis. No spinal stenosis. MRI THORACIC SPINE FINDINGS Multiple sequences are moderately motion degraded. Alignment:  Normal. Vertebrae: No evidence of fracture or discitis. No suspicious osseous lesion. No epidural fluid collection. Cord: Normal signal and morphology. No abnormal intradural enhancement. Paraspinal and other soft tissues: Small bilateral pleural effusions. Disc levels: Preserved disc space heights throughout the thoracic spine. No sizable disc herniation identified. Widely patent spinal canal and neural foramina throughout. MRI LUMBAR SPINE FINDINGS The study is mildly to moderately motion degraded. Segmentation:  Standard. Alignment: Unchanged trace retrolisthesis of L2 on L3, grade 1 retrolisthesis of L3 on L4, and grade 1 anterolisthesis of L4 on L5. Vertebrae: Prior L5-S1 PLIF with stable fatty marrow changes. Similar appearance of abnormal fluid signal throughout the L3-4 disc space, which is collapsed, with erosive changes and edema and enhancement about both endplates. Chronic severe disc space height loss at L4-5 without changes suggestive of discitis/  osteomyelitis at this level. No evidence of new lumbar spine infection. Conus medullaris: Extends to the L1-2 level and appears normal. Clumped appearance of the cauda equina nerve roots in the lower lumbar spine which could reflect arachnoiditis or be related to high-grade spinal stenosis more proximally. Paraspinal and other soft tissues: Similar appearance of bilateral paravertebral phlegmon at L3-4 involving both psoas muscles. No evidence of drainable fluid collection. Disc levels: Similar appearance of degenerative and postoperative changes, with detailed assessment deferred to the recent prior examination. Motion artifact on axial imaging limits assessment. Epidural enhancement is again noted at L3-4 without a discrete abscess identified. Multifactorial spinal stenosis is moderate at L2-3, severe at L3-4, and moderate to severe at L4-5. There is severe bilateral neural foraminal stenosis at L3-4. IMPRESSION: 1. 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 2. 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 3. No evidence of infection in the cervical or thoracic spine. 4. 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 5. Multilevel cervical neural foraminal stenosis, severe bilaterally at C6-7. 6. 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 Severe spinal stenosis. 7. No evidence of new infection in the lumbar spine. Electronically Signed   By: Sebastian Ache M.D.   On: 04/16/2017 21:10   Dg Chest Port 1 View  Result Date: 04/16/2017 CLINICAL DATA:  Rhonchi. EXAM: PORTABLE CHEST 1 VIEW COMPARISON:  04/15/2017 FINDINGS: The cardiomediastinal silhouette is unchanged. Aortic atherosclerosis is noted. Emphysema is again noted with asymmetric bullous changes in the right lung apex. Chronic interstitial coarsening is unchanged. There is no evidence of acute airspace  consolidation, edema, pleural effusion, or pneumothorax. IMPRESSION: COPD without evidence  of acute abnormality. Electronically Signed   By: Sebastian Ache M.D.   On: 04/16/2017 13:16   Ct Head Code Stroke Wo Contrast  Result Date: 04/16/2017 CLINICAL DATA:  Code stroke.  Facial droop. EXAM: CT HEAD WITHOUT CONTRAST TECHNIQUE: Contiguous axial images were obtained from the base of the skull through the vertex without intravenous contrast. COMPARISON:  04/12/2017 FINDINGS: Brain: There is no evidence of acute infarct, intracranial hemorrhage, mass, midline shift, or extra-axial fluid collection. There is moderate cerebral and cerebellar atrophy. Patchy to confluent cerebral white matter hypodensities are unchanged and nonspecific but compatible with moderate chronic small vessel ischemic disease. There are chronic lacunar infarcts in the thalami and basal ganglia. Vascular: Calcified atherosclerosis at the skullbase. No hyperdense vessel. Skull: No fracture or focal osseous lesion. Sinuses/Orbits: Mild-to-moderate left frontal and left ethmoid sinus mucosal thickening. Clear mastoid air cells. Left cataract extraction. Other: None. ASPECTS [facility] Stroke Program Early CT Score) - Ganglionic level infarction (caudate, lentiform nuclei, internal capsule, insula, M1-M3 cortex): 7 - Supraganglionic infarction (M4-M6 cortex): 3 Total score (0-10 with 10 being normal): 10 IMPRESSION: 1. No evidence of acute intracranial abnormality. 2. ASPECTS is 10. 3. 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 These results were communicated to Dr. Amada Jupiter at 3:13 pmon 12/16/2018by text page via the [facility] messaging system. Electronically Signed   By: Sebastian Ache M.D.   On: 04/16/2017 15:14      Assessment/Plan:  INTERVAL HISTORY:  Code Stroke called overnight for increased lethargy and concern for facial weakness. MRI brain showed acute or early subacute infarct in the left cerebellum. 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 No evidence of infection in the cervical or thoracic spine was seen.   Principal Problem:   Lumbar discitis Active Problems:   Hypokalemia   Essential hypertension   Tachycardia   Emmette Bossard is a 76 y.o. 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 Lumbar aspirate cultures on 04/13/17 are growing coagulase-negative staph. He was originally on Vancomycin and Cefepime and is now on Vancomycin alone.  Methicillin Sensitive Coagulase-Negative Staph Lumbar Infection: He has an unspecified history of penicillin allergy. Family do not feel that he wants further surgery per neurosurgery conversations. He did tolerate Cefepime earlier during the hospitalization. - Continue antibiotics for at least 6 weeks therapy; will switch Vancomycin to Ceftriaxone 2 grams q12h. - PICC placement pending   LOS: 5 days   Darreld Mclean, MD Internal Medicine PGY-3 04/17/2017, 3:12 PM

## 2017-04-17 NOTE — Progress Notes (Signed)
  Speech Language Pathology Treatment: Dysphagia  Patient Details Name: Norman Davis MRN: [id number] DOB: 04/30/1941 Today's Date: 04/17/2017 Time: [id number] SLP Time Calculation (min) (ACUTE ONLY): 11 min  Assessment / Plan / Recommendation Clinical Impression  Pt was alert but with a wet vocal quality upon SLP arrival. Mod cues were provided for an effortful cough to clear his voice. Pt then consumed almost 8 oz of water and 2 oz of applesauce before a wet vocal quality was noted again. Suspect that this may be more related to secretion management. Min-Mod cues were provided to facilitate oral holding with purees. Recommend continuing current diet and precautions for now.   HPI HPI: [name] presenting with generalized weakness. CT Head showed chronic atrophy and ischemic changes but no acute findings. MRI lumbar spine shows discitis/osteomyelitis. CXR is suggestive of COPD but wouthout acute consolidation. [name] He had overt coughing with thin liquids during med administration, therefore he was made NPO pending SLP swallowing evaluation. PMH includes schizophrenia, HTN, HLD, pagets, GERD, spinal stenosis, remote back surgery, and falls. Pt reports a h/o dysphagia ~20 years ago following an accident, but denies any difficulty more recently.      SLP Plan  Continue with current plan of care       Recommendations  Diet recommendations: Dysphagia 1 (puree);Thin liquid Liquids provided via: Straw;Cup Medication Administration: Crushed with puree Supervision: Staff to assist with self feeding;Full supervision/cueing for compensatory strategies Compensations: Slow rate;Small sips/bites;Minimize environmental distractions;Follow solids with liquid;Other (Comment)(check for oral holding) Postural Changes and/or Swallow Maneuvers: Seated upright 90 degrees;Upright 30-60 min after meal                Oral Care Recommendations:  Oral care BID Follow up Recommendations: Home health SLP;24 hour supervision/assistance SLP Visit Diagnosis: Dysphagia, oropharyngeal phase (R13.12) Plan: Continue with current plan of care       GO                [name], Latona Krichbaum 04/17/2017, 12:13 PM  Maxcine HamLaura Paiewonsky, M.A. CCC-SLP [phone]

## 2017-04-17 NOTE — Progress Notes (Signed)
Subjective: Complains of diffuse weakness.   Objective: Current vital signs: BP (!) 144/87   Pulse 88   Temp 99.1 F (37.3 C) (Axillary)   Resp (!) 9   Ht '5\' 9"'  (1.753 m)   Wt 71 kg (156 lb 8.4 oz)   SpO2 100%   BMI 23.11 kg/m  Vital signs in last 24 hours: Temp:  [99.1 F (37.3 C)-100.9 F (38.3 C)] 99.1 F (37.3 C) (12/17 0722) Pulse Rate:  [76-107] 88 (12/17 0722) Resp:  [9-26] 9 (12/17 0722) BP: (140-166)/(81-102) 144/87 (12/17 0902) SpO2:  [96 %-100 %] 100 % (12/17 0722) Weight:  [71 kg (156 lb 8.4 oz)-72 kg (158 lb 11.7 oz)] 71 kg (156 lb 8.4 oz) (12/17 0500)  Intake/Output from previous day: 12/16 0701 - 12/17 0700 In: 250 [IV Piggyback:250] Out: 1350 [IFBPP:9432] Intake/Output this shift: Total I/O In: 360 [P.O.:360] Out: -  Nutritional status: DIET - DYS 1 Room service appropriate? Yes; Fluid consistency: Thin  Mental Status:  Patient is awake with mildly decreased level of alertness. Oriented to situation but states he is in [place]. Able to answer simple yes or no questions. Significant apathy and increased latency of verbal responses.  Cranial Nerves: III,IV, VI: EOMI with saccadic visual pursuits noted. No ptosis  VII: Equivocal mild right facial weakness VIII: hearing is intact to voice Motor: Able to raise both his arms against gravity with significant bradykinesia and poor effort bilaterally. Bilateral lower ext diffusely weak, including psoas, quadriceps and ankle dorsi/plantarflexion.   Lab Results: Results for orders placed or performed during the hospital encounter of 04/12/17 (from the past 48 hour(s))  Prealbumin     Status: Abnormal   Collection Time: 04/15/17 10:36 AM  Result Value Ref Range   Prealbumin 7.1 (L) 18 - 38 mg/dL  Brain natriuretic peptide     Status: Abnormal   Collection Time: 04/15/17 10:36 AM  Result Value Ref Range   B Natriuretic Peptide 109.1 (H) 0.0 - 100.0 pg/mL  CBC     Status: Abnormal   Collection Time:  04/16/17  7:03 AM  Result Value Ref Range   WBC 9.2 4.0 - 10.5 K/uL   RBC 3.98 (L) 4.22 - 5.81 MIL/uL   Hemoglobin 11.4 (L) 13.0 - 17.0 g/dL   HCT 35.6 (L) 39.0 - 52.0 %   MCV 89.4 78.0 - 100.0 fL   MCH 28.6 26.0 - 34.0 pg   MCHC 32.0 30.0 - 36.0 g/dL   RDW 15.1 11.5 - 15.5 %   Platelets 164 150 - 400 K/uL  Glucose, capillary     Status: Abnormal   Collection Time: 04/16/17  1:03 PM  Result Value Ref Range   Glucose-Capillary 102 (H) 65 - 99 mg/dL  Ammonia     Status: None   Collection Time: 04/16/17  2:10 PM  Result Value Ref Range   Ammonia 25 9 - 35 umol/L  Blood gas, arterial     Status: Abnormal   Collection Time: 04/16/17  2:25 PM  Result Value Ref Range   O2 Content 2.0 L/min   Delivery systems NASAL CANNULA    pH, Arterial 7.420 7.350 - 7.450   pCO2 arterial 40.0 32.0 - 48.0 mmHg   pO2, Arterial 109 (H) 83.0 - 108.0 mmHg   Bicarbonate 25.2 20.0 - 28.0 mmol/L   Acid-Base Excess 1.4 0.0 - 2.0 mmol/L   O2 Saturation 97.8 %   Patient temperature 100.0    Collection site RIGHT RADIAL    Drawn  by [phone]    Sample type ARTERIAL DRAW    Allens test (pass/fail) PASS PASS  Ammonia     Status: None   Collection Time: 04/16/17  4:32 PM  Result Value Ref Range   Ammonia 20 9 - 35 umol/L  Creatinine, serum     Status: None   Collection Time: 04/16/17  4:32 PM  Result Value Ref Range   Creatinine, Ser 0.94 0.61 - 1.24 mg/dL   GFR calc non Af Amer >60 >60 mL/min   GFR calc Af Amer >60 >60 mL/min    Comment: (NOTE) The eGFR has been calculated using the CKD EPI equation. This calculation has not been validated in all clinical situations. eGFR's persistently <60 mL/min signify possible Chronic Kidney Disease.     Recent Results (from the past 240 hour(s))  Culture, blood (Routine X 2) w Reflex to ID Panel     Status: None (Preliminary result)   Collection Time: 04/12/17  7:07 PM  Result Value Ref Range Status   Specimen Description BLOOD LEFT ANTECUBITAL  Final    Special Requests   Final    BOTTLES DRAWN AEROBIC AND ANAEROBIC Blood Culture results may not be optimal due to an inadequate volume of blood received in culture bottles   Culture   Final    NO GROWTH 4 DAYS Performed at [facility] [address] [place], [place] [address]    Report Status PENDING  Incomplete  Culture, blood (Routine X 2) w Reflex to ID Panel     Status: None (Preliminary result)   Collection Time: 04/12/17 10:10 PM  Result Value Ref Range Status   Specimen Description BLOOD RIGHT HAND  Final   Special Requests IN PEDIATRIC BOTTLE Blood Culture adequate volume  Final   Culture   Final    NO GROWTH 4 DAYS Performed at [facility], [place] [address], [place], [place] [address]    Report Status PENDING  Incomplete  Body fluid culture     Status: None   Collection Time: 04/13/17  4:03 PM  Result Value Ref Range Status   Specimen Description FLUID INTERVERTEBRAL DISC  Final   Special Requests NONE  Final   Gram Stain   Final    FEW WBC PRESENT,BOTH PMN AND MONONUCLEAR NO ORGANISMS SEEN    Culture   Final    FEW STAPHYLOCOCCUS SPECIES (COAGULASE NEGATIVE) NO ANAEROBES ISOLATED CRITICAL RESULT CALLED TO, READ BACK BY AND VERIFIED WITH: L TURNER,RN AT 1113 04/15/17 BY L BENFIELD CONCERNING GROWTH ON CULTURE Performed at [facility], [place] [address], [place],  [address]    Report Status 04/16/2017 FINAL  Final   Organism ID, Bacteria STAPHYLOCOCCUS SPECIES (COAGULASE NEGATIVE)  Final      Susceptibility   Staphylococcus species (coagulase negative) - MIC*    CIPROFLOXACIN INTERMEDIATE Intermediate     ERYTHROMYCIN >=8 RESISTANT Resistant     GENTAMICIN <=0.5 SENSITIVE Sensitive     OXACILLIN <=0.25 SENSITIVE Sensitive     TETRACYCLINE <=1 SENSITIVE Sensitive     VANCOMYCIN <=0.5 SENSITIVE Sensitive     TRIMETH/SULFA <=10 SENSITIVE Sensitive     CLINDAMYCIN RESISTANT Resistant     RIFAMPIN <=0.5 SENSITIVE Sensitive     Inducible  Clindamycin POSITIVE Resistant     * FEW STAPHYLOCOCCUS SPECIES (COAGULASE NEGATIVE)  MRSA PCR Screening     Status: None   Collection Time: 04/14/17  3:59 AM  Result Value Ref Range Status   MRSA  by PCR NEGATIVE NEGATIVE Final    Comment:        The GeneXpert MRSA Assay (FDA approved for NASAL specimens only), is one component of a comprehensive MRSA colonization surveillance program. It is not intended to diagnose MRSA infection nor to guide or monitor treatment for MRSA infections.     Lipid Panel No results for input(s): CHOL, TRIG, HDL, CHOLHDL, VLDL, LDLCALC in the last 72 hours.  Studies/Results: Mr Jeri Cos EX Contrast  Result Date: 04/16/2017 CLINICAL DATA:  Lethargy. Facial droop. 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 EXAM: MRI HEAD WITHOUT AND WITH CONTRAST MRI CERVICAL SPINE WITHOUT AND WITH CONTRAST MRI THORACIC SPINE WITHOUT AND WITH CONTRAST MRI LUMBAR SPINE WITHOUT AND WITH CONTRAST TECHNIQUE: Multiplanar, multiecho pulse sequences of the brain and surrounding structures, cervical spine, to include the craniocervical junction and cervicothoracic junction, and thoracic and lumbar spine were obtained without and with intravenous contrast. CONTRAST:  41m MULTIHANCE GADOBENATE DIMEGLUMINE 529 MG/ML IV SOLN COMPARISON:  Head CT 04/16/2017. Brain MRI 07/22/2015. Chest CTA 04/14/2017. Lumbar spine MRI 04/12/2017. Cervical spine CT 07/21/2015. 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 There is no evidence of acute supratentorial infarct. A chronic microhemorrhage in the left temporal lobe is unchanged from the prior MRI. No mass, midline shift, or extra-axial fluid collection is seen. There is moderate cerebral atrophy. Patchy T2 hyperintensities in the cerebral white matter and pons are similar to the prior MRI and nonspecific but compatible with moderate chronic small vessel ischemic disease. 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 No abnormal enhancement is identified, however postcontrast imaging is motion degraded, severely so on the axial sequence. Vascular: Major intracranial vascular flow voids are preserved. 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 Skull and upper cervical spine: Unremarkable bone marrow signal. Sinuses/Orbits: Left cataract extraction. Moderate left ethmoid air cell mucosal thickening. Mild left frontal sinus mucosal thickening. Moderate right mastoid effusion. Other: None. MRI CERVICAL SPINE FINDINGS Multiple sequences are moderately motion degraded. Alignment: Cervical spine straightening. At most minimal retrolisthesis of C6 on C7 and minimal anterolisthesis of C7 on T1. Slight left convex curvature of the cervical spine. Vertebrae: No evidence of fracture or air discitis/ osteomyelitis. Multilevel degenerative marrow changes, greatest along the C5 inferior endplate. No epidural fluid collection. Cord: Normal signal and morphology. No abnormal intradural enhancement. Posterior Fossa, vertebral arteries, paraspinal tissues: Grossly preserved vertebral artery flow voids in the upper neck. No paraspinal fluid collection. Disc levels: Moderate to severe disc space narrowing throughout the cervical spine with exception of C7-T1. C2-3: Mild uncovertebral spurring and mild right facet arthrosis without significant stenosis. C3-4: Uncovertebral spurring and minimal disc bulging without stenosis. C4-5: Broad-based posterior disc osteophyte complex results in mild-to-moderate left neural foraminal stenosis and mild spinal stenosis. C5-6: Broad-based posterior disc osteophyte complex results in moderate bilateral neural foraminal stenosis without significant spinal stenosis. C6-7: Broad-based posterior disc  osteophyte complex results in borderline to mild spinal stenosis and severe bilateral neural foraminal stenosis. C7-T1: Moderate right facet arthrosis result in moderate to severe right neural foraminal stenosis. No spinal stenosis. MRI THORACIC SPINE FINDINGS Multiple sequences are moderately motion degraded. Alignment:  Normal. Vertebrae: No evidence of fracture or discitis. No suspicious osseous lesion. No epidural fluid collection. Cord: Normal signal and morphology. No abnormal  intradural enhancement. Paraspinal and other soft tissues: Small bilateral pleural effusions. Disc levels: Preserved disc space heights throughout the thoracic spine. No sizable disc herniation identified. Widely patent spinal canal and neural foramina throughout. MRI LUMBAR SPINE FINDINGS The study is mildly to moderately motion degraded. Segmentation:  Standard. Alignment: Unchanged trace retrolisthesis of L2 on L3, grade 1 retrolisthesis of L3 on L4, and grade 1 anterolisthesis of L4 on L5. Vertebrae: Prior L5-S1 PLIF with stable fatty marrow changes. Similar appearance of abnormal fluid signal throughout the L3-4 disc space, which is collapsed, with erosive changes and edema and enhancement about both endplates. Chronic severe disc space height loss at L4-5 without changes suggestive of discitis/ osteomyelitis at this level. No evidence of new lumbar spine infection. Conus medullaris: Extends to the L1-2 level and appears normal. Clumped appearance of the cauda equina nerve roots in the lower lumbar spine which could reflect arachnoiditis or be related to high-grade spinal stenosis more proximally. Paraspinal and other soft tissues: Similar appearance of bilateral paravertebral phlegmon at L3-4 involving both psoas muscles. No evidence of drainable fluid collection. Disc levels: Similar appearance of degenerative and postoperative changes, with detailed assessment deferred to the recent prior examination. Motion artifact on axial  imaging limits assessment. Epidural enhancement is again noted at L3-4 without a discrete abscess identified. Multifactorial spinal stenosis is moderate at L2-3, severe at L3-4, and moderate to severe at L4-5. There is severe bilateral neural foraminal stenosis at L3-4. IMPRESSION: 1. 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 2. 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 3. No evidence of infection in the cervical or thoracic spine. 4. 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 5. Multilevel cervical neural foraminal stenosis, severe bilaterally at C6-7. 6. 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 Severe spinal stenosis. 7. No evidence of new infection in the lumbar spine. Electronically Signed   By: Logan Bores M.D.   On: 04/16/2017 21:10   Mr Cervical Spine W Wo Contrast  Result Date: 04/16/2017 CLINICAL DATA:  Lethargy. Facial droop. 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 EXAM: MRI HEAD WITHOUT AND WITH CONTRAST MRI CERVICAL SPINE WITHOUT AND WITH CONTRAST MRI THORACIC SPINE WITHOUT AND WITH CONTRAST MRI LUMBAR SPINE WITHOUT AND WITH CONTRAST TECHNIQUE: Multiplanar, multiecho pulse sequences of the brain and surrounding structures, cervical spine, to include the craniocervical junction and cervicothoracic junction, and thoracic and lumbar spine were obtained without and with intravenous contrast. CONTRAST:  46m MULTIHANCE GADOBENATE DIMEGLUMINE 529 MG/ML IV SOLN COMPARISON:  Head CT 04/16/2017. Brain MRI 07/22/2015. Chest CTA 04/14/2017. Lumbar spine MRI 04/12/2017. Cervical spine CT 07/21/2015. 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 There is no evidence of acute supratentorial infarct. A chronic microhemorrhage in the left temporal lobe is unchanged from the  prior MRI. No mass, midline shift, or extra-axial fluid collection is seen. There is moderate cerebral atrophy. Patchy T2 hyperintensities in the cerebral white matter and pons are similar to the prior MRI and nonspecific but compatible with moderate chronic small vessel ischemic disease. 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 No abnormal enhancement is identified, however postcontrast imaging is motion degraded, severely so on the axial sequence. Vascular: Major intracranial vascular flow  voids are preserved. 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 Skull and upper cervical spine: Unremarkable bone marrow signal. Sinuses/Orbits: Left cataract extraction. Moderate left ethmoid air cell mucosal thickening. Mild left frontal sinus mucosal thickening. Moderate right mastoid effusion. Other: None. MRI CERVICAL SPINE FINDINGS Multiple sequences are moderately motion degraded. Alignment: Cervical spine straightening. At most minimal retrolisthesis of C6 on C7 and minimal anterolisthesis of C7 on T1. Slight left convex curvature of the cervical spine. Vertebrae: No evidence of fracture or air discitis/ osteomyelitis. Multilevel degenerative marrow changes, greatest along the C5 inferior endplate. No epidural fluid collection. Cord: Normal signal and morphology. No abnormal intradural enhancement. Posterior Fossa, vertebral arteries, paraspinal tissues: Grossly preserved vertebral artery flow voids in the upper neck. No paraspinal fluid collection. Disc levels: Moderate to severe disc space narrowing throughout the cervical spine with exception of C7-T1. C2-3: Mild uncovertebral spurring and mild right facet arthrosis without significant stenosis. C3-4: Uncovertebral spurring and minimal disc bulging without stenosis. C4-5: Broad-based posterior  disc osteophyte complex results in mild-to-moderate left neural foraminal stenosis and mild spinal stenosis. C5-6: Broad-based posterior disc osteophyte complex results in moderate bilateral neural foraminal stenosis without significant spinal stenosis. C6-7: Broad-based posterior disc osteophyte complex results in borderline to mild spinal stenosis and severe bilateral neural foraminal stenosis. C7-T1: Moderate right facet arthrosis result in moderate to severe right neural foraminal stenosis. No spinal stenosis. MRI THORACIC SPINE FINDINGS Multiple sequences are moderately motion degraded. Alignment:  Normal. Vertebrae: No evidence of fracture or discitis. No suspicious osseous lesion. No epidural fluid collection. Cord: Normal signal and morphology. No abnormal intradural enhancement. Paraspinal and other soft tissues: Small bilateral pleural effusions. Disc levels: Preserved disc space heights throughout the thoracic spine. No sizable disc herniation identified. Widely patent spinal canal and neural foramina throughout. MRI LUMBAR SPINE FINDINGS The study is mildly to moderately motion degraded. Segmentation:  Standard. Alignment: Unchanged trace retrolisthesis of L2 on L3, grade 1 retrolisthesis of L3 on L4, and grade 1 anterolisthesis of L4 on L5. Vertebrae: Prior L5-S1 PLIF with stable fatty marrow changes. Similar appearance of abnormal fluid signal throughout the L3-4 disc space, which is collapsed, with erosive changes and edema and enhancement about both endplates. Chronic severe disc space height loss at L4-5 without changes suggestive of discitis/ osteomyelitis at this level. No evidence of new lumbar spine infection. Conus medullaris: Extends to the L1-2 level and appears normal. Clumped appearance of the cauda equina nerve roots in the lower lumbar spine which could reflect arachnoiditis or be related to high-grade spinal stenosis more proximally. Paraspinal and other soft tissues: Similar appearance  of bilateral paravertebral phlegmon at L3-4 involving both psoas muscles. No evidence of drainable fluid collection. Disc levels: Similar appearance of degenerative and postoperative changes, with detailed assessment deferred to the recent prior examination. Motion artifact on axial imaging limits assessment. Epidural enhancement is again noted at L3-4 without a discrete abscess identified. Multifactorial spinal stenosis is moderate at L2-3, severe at L3-4, and moderate to severe at L4-5. There is severe bilateral neural foraminal stenosis at L3-4. IMPRESSION: 1. 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 2. 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 3. No evidence of infection in the cervical or thoracic spine. 4. 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 5. Multilevel cervical neural foraminal stenosis, severe bilaterally at C6-7. 6. 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 Severe spinal stenosis. 7. No evidence of new  infection in the lumbar spine. Electronically Signed   By: Logan Bores M.D.   On: 04/16/2017 21:10   Mr Thoracic Spine W Wo Contrast  Result Date: 04/16/2017 CLINICAL DATA:  Lethargy. Facial droop. 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 EXAM: MRI HEAD WITHOUT AND WITH CONTRAST MRI CERVICAL SPINE WITHOUT AND WITH CONTRAST MRI THORACIC SPINE WITHOUT AND WITH CONTRAST MRI LUMBAR SPINE WITHOUT AND WITH CONTRAST TECHNIQUE: Multiplanar, multiecho pulse sequences of the brain and surrounding structures, cervical spine, to include the craniocervical junction and cervicothoracic junction, and thoracic and lumbar spine were obtained without and with intravenous contrast. CONTRAST:  31m MULTIHANCE GADOBENATE DIMEGLUMINE 529 MG/ML IV SOLN COMPARISON:  Head CT 04/16/2017. Brain MRI 07/22/2015. Chest CTA 04/14/2017. Lumbar spine  MRI 04/12/2017. Cervical spine CT 07/21/2015. 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 There is no evidence of acute supratentorial infarct. A chronic microhemorrhage in the left temporal lobe is unchanged from the prior MRI. No mass, midline shift, or extra-axial fluid collection is seen. There is moderate cerebral atrophy. Patchy T2 hyperintensities in the cerebral white matter and pons are similar to the prior MRI and nonspecific but compatible with moderate chronic small vessel ischemic disease. 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 No abnormal enhancement is identified, however postcontrast imaging is motion degraded, severely so on the axial sequence. Vascular: Major intracranial vascular flow voids are preserved. 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 Skull and upper cervical spine: Unremarkable bone marrow signal. Sinuses/Orbits: Left cataract extraction. Moderate left ethmoid air cell mucosal thickening. Mild left frontal sinus mucosal thickening. Moderate right mastoid effusion. Other: None. MRI CERVICAL SPINE FINDINGS Multiple sequences are moderately motion degraded. Alignment: Cervical spine straightening. At most minimal retrolisthesis of C6 on C7 and minimal anterolisthesis of C7 on T1. Slight left convex curvature of the cervical spine. Vertebrae: No evidence of fracture or air discitis/ osteomyelitis. Multilevel degenerative marrow changes, greatest along the C5 inferior endplate. No epidural fluid collection. Cord: Normal signal and morphology. No abnormal intradural enhancement. Posterior Fossa, vertebral arteries, paraspinal tissues: Grossly preserved vertebral artery flow voids in the upper neck. No paraspinal fluid collection. Disc levels: Moderate  to severe disc space narrowing throughout the cervical spine with exception of C7-T1. C2-3: Mild uncovertebral spurring and mild right facet arthrosis without significant stenosis. C3-4: Uncovertebral spurring and minimal disc bulging without stenosis. C4-5: Broad-based posterior disc osteophyte complex results in mild-to-moderate left neural foraminal stenosis and mild spinal stenosis. C5-6: Broad-based posterior disc osteophyte complex results in moderate bilateral neural foraminal stenosis without significant spinal stenosis. C6-7: Broad-based posterior disc osteophyte complex results in borderline to mild spinal stenosis and severe bilateral neural foraminal stenosis. C7-T1: Moderate right facet arthrosis result in moderate to severe right neural foraminal stenosis. No spinal stenosis. MRI THORACIC SPINE FINDINGS Multiple sequences are moderately motion degraded. Alignment:  Normal. Vertebrae: No evidence of fracture or discitis. No suspicious osseous lesion. No epidural fluid collection. Cord: Normal signal and morphology. No abnormal intradural enhancement. Paraspinal and other soft tissues: Small bilateral pleural effusions. Disc levels: Preserved disc space heights throughout the thoracic spine. No sizable disc herniation identified. Widely patent spinal canal and neural foramina throughout. MRI LUMBAR SPINE FINDINGS The study is mildly to moderately motion degraded. Segmentation:  Standard. Alignment: Unchanged trace retrolisthesis of  L2 on L3, grade 1 retrolisthesis of L3 on L4, and grade 1 anterolisthesis of L4 on L5. Vertebrae: Prior L5-S1 PLIF with stable fatty marrow changes. Similar appearance of abnormal fluid signal throughout the L3-4 disc space, which is collapsed, with erosive changes and edema and enhancement about both endplates. Chronic severe disc space height loss at L4-5 without changes suggestive of discitis/ osteomyelitis at this level. No evidence of new lumbar spine infection. Conus  medullaris: Extends to the L1-2 level and appears normal. Clumped appearance of the cauda equina nerve roots in the lower lumbar spine which could reflect arachnoiditis or be related to high-grade spinal stenosis more proximally. Paraspinal and other soft tissues: Similar appearance of bilateral paravertebral phlegmon at L3-4 involving both psoas muscles. No evidence of drainable fluid collection. Disc levels: Similar appearance of degenerative and postoperative changes, with detailed assessment deferred to the recent prior examination. Motion artifact on axial imaging limits assessment. Epidural enhancement is again noted at L3-4 without a discrete abscess identified. Multifactorial spinal stenosis is moderate at L2-3, severe at L3-4, and moderate to severe at L4-5. There is severe bilateral neural foraminal stenosis at L3-4. IMPRESSION: 1. 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 2. 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 3. No evidence of infection in the cervical or thoracic spine. 4. 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 5. Multilevel cervical neural foraminal stenosis, severe bilaterally at C6-7. 6. 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 Severe spinal stenosis. 7. No evidence of new infection in the lumbar spine. Electronically Signed   By: Logan Bores M.D.   On: 04/16/2017 21:10   Mr Lumbar Spine W Wo Contrast  Result Date: 04/16/2017 CLINICAL DATA:  Lethargy. Facial droop. 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 EXAM: MRI HEAD WITHOUT AND WITH CONTRAST MRI CERVICAL SPINE WITHOUT AND WITH CONTRAST MRI THORACIC SPINE WITHOUT AND WITH CONTRAST MRI LUMBAR SPINE WITHOUT AND WITH CONTRAST TECHNIQUE: Multiplanar, multiecho pulse sequences of the brain and surrounding structures, cervical spine, to include the craniocervical  junction and cervicothoracic junction, and thoracic and lumbar spine were obtained without and with intravenous contrast. CONTRAST:  4m MULTIHANCE GADOBENATE DIMEGLUMINE 529 MG/ML IV SOLN COMPARISON:  Head CT 04/16/2017. Brain MRI 07/22/2015. Chest CTA 04/14/2017. Lumbar spine MRI 04/12/2017. Cervical spine CT 07/21/2015. 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 There is no evidence of acute supratentorial infarct. A chronic microhemorrhage in the left temporal lobe is unchanged from the prior MRI. No mass, midline shift, or extra-axial fluid collection is seen. There is moderate cerebral atrophy. Patchy T2 hyperintensities in the cerebral white matter and pons are similar to the prior MRI and nonspecific but compatible with moderate chronic small vessel ischemic disease. 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 No abnormal enhancement is identified, however postcontrast imaging is motion degraded, severely so on the axial sequence. Vascular: Major intracranial vascular flow voids are preserved. 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 Skull and upper cervical spine: Unremarkable bone marrow signal. Sinuses/Orbits: Left cataract extraction. Moderate left ethmoid air cell mucosal thickening. Mild left frontal sinus mucosal thickening. Moderate right mastoid effusion. Other: None. MRI CERVICAL SPINE FINDINGS Multiple sequences are moderately motion degraded. Alignment: Cervical spine  straightening. At most minimal retrolisthesis of C6 on C7 and minimal anterolisthesis of C7 on T1. Slight left convex curvature of the cervical spine. Vertebrae: No evidence of fracture or air discitis/ osteomyelitis. Multilevel degenerative marrow changes, greatest along the C5 inferior endplate.  No epidural fluid collection. Cord: Normal signal and morphology. No abnormal intradural enhancement. Posterior Fossa, vertebral arteries, paraspinal tissues: Grossly preserved vertebral artery flow voids in the upper neck. No paraspinal fluid collection. Disc levels: Moderate to severe disc space narrowing throughout the cervical spine with exception of C7-T1. C2-3: Mild uncovertebral spurring and mild right facet arthrosis without significant stenosis. C3-4: Uncovertebral spurring and minimal disc bulging without stenosis. C4-5: Broad-based posterior disc osteophyte complex results in mild-to-moderate left neural foraminal stenosis and mild spinal stenosis. C5-6: Broad-based posterior disc osteophyte complex results in moderate bilateral neural foraminal stenosis without significant spinal stenosis. C6-7: Broad-based posterior disc osteophyte complex results in borderline to mild spinal stenosis and severe bilateral neural foraminal stenosis. C7-T1: Moderate right facet arthrosis result in moderate to severe right neural foraminal stenosis. No spinal stenosis. MRI THORACIC SPINE FINDINGS Multiple sequences are moderately motion degraded. Alignment:  Normal. Vertebrae: No evidence of fracture or discitis. No suspicious osseous lesion. No epidural fluid collection. Cord: Normal signal and morphology. No abnormal intradural enhancement. Paraspinal and other soft tissues: Small bilateral pleural effusions. Disc levels: Preserved disc space heights throughout the thoracic spine. No sizable disc herniation identified. Widely patent spinal canal and neural foramina throughout. MRI LUMBAR SPINE FINDINGS The study is mildly to moderately motion degraded. Segmentation:  Standard. Alignment: Unchanged trace retrolisthesis of L2 on L3, grade 1 retrolisthesis of L3 on L4, and grade 1 anterolisthesis of L4 on L5. Vertebrae: Prior L5-S1 PLIF with stable fatty marrow changes. Similar appearance of abnormal fluid signal throughout  the L3-4 disc space, which is collapsed, with erosive changes and edema and enhancement about both endplates. Chronic severe disc space height loss at L4-5 without changes suggestive of discitis/ osteomyelitis at this level. No evidence of new lumbar spine infection. Conus medullaris: Extends to the L1-2 level and appears normal. Clumped appearance of the cauda equina nerve roots in the lower lumbar spine which could reflect arachnoiditis or be related to high-grade spinal stenosis more proximally. Paraspinal and other soft tissues: Similar appearance of bilateral paravertebral phlegmon at L3-4 involving both psoas muscles. No evidence of drainable fluid collection. Disc levels: Similar appearance of degenerative and postoperative changes, with detailed assessment deferred to the recent prior examination. Motion artifact on axial imaging limits assessment. Epidural enhancement is again noted at L3-4 without a discrete abscess identified. Multifactorial spinal stenosis is moderate at L2-3, severe at L3-4, and moderate to severe at L4-5. There is severe bilateral neural foraminal stenosis at L3-4. IMPRESSION: 1. 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 2. 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 3. No evidence of infection in the cervical or thoracic spine. 4. 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 5. Multilevel cervical neural foraminal stenosis, severe bilaterally at C6-7. 6. 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 Severe spinal stenosis. 7. No evidence of new infection in the lumbar spine. Electronically Signed   By: Logan Bores M.D.   On: 04/16/2017 21:10   Dg Chest Port 1 View  Result Date: 04/16/2017 CLINICAL DATA:  Rhonchi. EXAM: PORTABLE CHEST 1 VIEW COMPARISON:  04/15/2017 FINDINGS: The cardiomediastinal silhouette is unchanged.  Aortic atherosclerosis is noted. Emphysema  is again noted with asymmetric bullous changes in the right lung apex. Chronic interstitial coarsening is unchanged. There is no evidence of acute airspace consolidation, edema, pleural effusion, or pneumothorax. IMPRESSION: COPD without evidence of acute abnormality. Electronically Signed   By: Logan Bores M.D.   On: 04/16/2017 13:16   Dg Chest Port 1 View  Result Date: 04/15/2017 CLINICAL DATA:  Edema. EXAM: PORTABLE CHEST 1 VIEW COMPARISON:  04/12/2017 FINDINGS: The heart size is stable. Stable tortuosity of the thoracic aorta. Stable advanced emphysematous lung disease. No acute edema, airspace consolidation or pleural fluid identified. No pneumothorax. IMPRESSION: Stable advanced emphysematous lung disease. Electronically Signed   By: Aletta Edouard M.D.   On: 04/15/2017 11:57   Ct Head Code Stroke Wo Contrast  Result Date: 04/16/2017 CLINICAL DATA:  Code stroke.  Facial droop. EXAM: CT HEAD WITHOUT CONTRAST TECHNIQUE: Contiguous axial images were obtained from the base of the skull through the vertex without intravenous contrast. COMPARISON:  04/12/2017 FINDINGS: Brain: There is no evidence of acute infarct, intracranial hemorrhage, mass, midline shift, or extra-axial fluid collection. There is moderate cerebral and cerebellar atrophy. Patchy to confluent cerebral white matter hypodensities are unchanged and nonspecific but compatible with moderate chronic small vessel ischemic disease. There are chronic lacunar infarcts in the thalami and basal ganglia. Vascular: Calcified atherosclerosis at the skullbase. No hyperdense vessel. Skull: No fracture or focal osseous lesion. Sinuses/Orbits: Mild-to-moderate left frontal and left ethmoid sinus mucosal thickening. Clear mastoid air cells. Left cataract extraction. Other: None. ASPECTS [facility] Stroke Program Early CT Score) - Ganglionic level infarction (caudate, lentiform nuclei, internal capsule, insula,  M1-M3 cortex): 7 - Supraganglionic infarction (M4-M6 cortex): 3 Total score (0-10 with 10 being normal): 10 IMPRESSION: 1. No evidence of acute intracranial abnormality. 2. ASPECTS is 10. 3. 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 These results were communicated to Dr. Leonel Ramsay at 3:13 pmon 12/16/2018by text page via the [facility] messaging system. Electronically Signed   By: Logan Bores M.D.   On: 04/16/2017 15:14    Medications:  Scheduled: . amLODipine  10 mg Oral Daily  . cholecalciferol  2,000 Units Oral Daily  . clopidogrel  75 mg Oral Q breakfast  . doxazosin  8 mg Oral QHS  . feeding supplement (ENSURE ENLIVE)  237 mL Oral BID BM  . feeding supplement (PRO-STAT SUGAR FREE 64)  30 mL Oral BID  . finasteride  5 mg Oral Daily  . FLUoxetine  40 mg Oral Daily  . fluticasone  2 spray Each Nare QHS  . folic acid  1 mg Oral Daily  . loratadine  10 mg Oral Daily  . losartan  25 mg Oral Daily  . mouth rinse  15 mL Mouth Rinse BID  . Melatonin  1 tablet Oral QHS  . mirabegron ER  25 mg Oral Daily  . mirtazapine  30 mg Oral QHS  . pantoprazole  40 mg Oral Daily  . senna  1 tablet Oral BID  . tiotropium  18 mcg Inhalation Daily   Continuous: . vancomycin Stopped (04/16/17 2305)   MWN:[id number] **OR** acetaminophen, acetaminophen, albuterol, hydrALAZINE, polyethylene glycol  MRI brain, cervical, thoracic and lumbar: IMPRESSION: 1. 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 2. 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 3. No evidence of infection in the cervical or thoracic spine. 4. 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 5. Multilevel cervical neural foraminal stenosis, severe bilaterally at C6-7. 6. 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 Severe spinal stenosis. 7. No evidence of  new infection in the lumbar spine.  Assessment/Plan:   77 y.o. 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 Neurology consulted for increasing lethargy and there was some concern for facial weakness as well.  1. His decreased mental status is most likely due to toxic/metabolic encephalopathy in the setting of his infection.  2. MRI brain shows a punctate small vessel acute or early subacute infarct in the left cerebellum. Based upon the appearance on MRI, it appears most likely to be due to cerebral vasospasm, which can occur in the setting of diffuse inflammation, such as with infection. TTE on 12/13 showed no mural thrombus. Continue Plavix.  3. 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 Per ID, plan is to continue Vancomycin for at least 6 weeks therapy. Per Neurosurgery, there has been no change in lumbar MRI. 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 Patient's sister does not really want to be overly aggressive from a surgical perspective and also stated to Neurosurgery that she does not feel that he would want more surgery. 4. Neurology will sign off for now. Please call if there are additional questions.     LOS: 5 days   '@Electronically'  signed: Dr. Kerney Elbe 04/17/2017  10:29 AM

## 2017-04-17 NOTE — Plan of Care (Signed)
Discussed with patient about plan of care for the evening, pain management, medications with applesauce and hygiene for a bath tonight with some teach back displayed.

## 2017-04-17 NOTE — Progress Notes (Addendum)
PROGRESS NOTE    Norman Davis   ZOX:[id number]  DOB: 13-Aug-1940  DOA: 04/12/2017 PCP: Kirt Boys, DO   Brief Narrative:  Norman Davis 76 y.o.male,w hx of Schizophrenia, Hypertension, Hyperlipidemia, Pagets, [name], Spinal stenosis, prior back surgery presents with weakness and inability to walk starting on Monday.  X-ray and MRI revealed L3-L4 discitis, osteomyelitis and bilateral psoas myositis.  Subjective: He is quite sleepy and unable to give much of a history or answer review of symptoms. He does tell me that he is not in any pain and is not hungry. ROS: no complaints of nausea, vomiting, constipation diarrhea, cough, dyspnea or dysuria. No other complaints.   Assessment & Plan:   Principal Problem: Lumbar discitis/ osteomyelitis/ psoas myositis Inability to ambulate - aspiration of L3-L4 interspace on 12/13 by IR- cultures >> coag neg staph - has PCN allergy- per ID, cont Vanc - per NS, no surgical intervention needed - PT eval ordered today  Severe spinal stenosis/ b/l lower extremity weakness - per NS notes today, patient's sister does not want to be aggressive in regards to surgery - NS feels this stenosis is chronic  Acute CVA - noted on MRI which was ordered by Neuro on 12/16 - MRI: punctate small vessel acute or early subacute infarct in the left cerebellum. - per neuro note today, infarct may be related to cerebral vasospasm in setting of inflammation - takes Plavix at home- can continue  Lethargic - stop Neurontin, Remeron and Vicodin and allow him to awaken   Hypokalemia - replaced   Essential hypertension - Norvasc, Losartan  BPH - Cardura, Proscar, Mirabegron  Schizophrenia - takes Paliperidone Q 3 months - Prozac - hold Remeron- due to above  Left arm edema - acute superficial venous thrombosis in the cephalic vein   DVT prophylaxis: SCDs Code Status: Full code Family Communication:  Disposition Plan: follow for improvement in  lethargy after holding meds, needs PT eval- change status to OOB with assistance- transfer to med surg today Consultants:   Neuro  neurosurgery Procedures:    IR aspiration Antimicrobials:  Anti-infectives (From admission, onward)   Start     Dose/Rate Route Frequency Ordered Stop   04/16/17 2200  vancomycin (VANCOCIN) 1,250 mg in sodium chloride 0.9 % 250 mL IVPB     1,250 mg 166.7 mL/hr over 90 Minutes Intravenous Every 24 hours 04/16/17 1735     04/13/17 1600  vancomycin (VANCOCIN) 1,250 mg in sodium chloride 0.9 % 250 mL IVPB  Status:  Discontinued     1,250 mg 166.7 mL/hr over 90 Minutes Intravenous Every 24 hours 04/12/17 1957 04/16/17 1735   04/13/17 1000  ceFEPIme (MAXIPIME) 2 g in dextrose 5 % 50 mL IVPB  Status:  Discontinued     2 g 100 mL/hr over 30 Minutes Intravenous Every 12 hours 04/12/17 1957 04/15/17 1646   04/12/17 2030  vancomycin (VANCOCIN) 1,500 mg in sodium chloride 0.9 % 500 mL IVPB     1,500 mg 250 mL/hr over 120 Minutes Intravenous  Once 04/12/17 1928 04/12/17 2345   04/12/17 2000  ceFEPIme (MAXIPIME) 2 g in dextrose 5 % 50 mL IVPB     2 g 100 mL/hr over 30 Minutes Intravenous  Once 04/12/17 1928 04/12/17 2142       Objective: Vitals:   04/17/17 0722 04/17/17 0902 04/17/17 0903 04/17/17 1147  BP: (!) 155/97 (!) 144/87 (!) 149/105 (!) 150/98  Pulse: 88   92  Resp: (!) 9   13  Temp:  99.1 F (37.3 C)   98.1 F (36.7 C)  TempSrc: Axillary   Oral  SpO2: 100%   98%  Weight:      Height:        Intake/Output Summary (Last 24 hours) at 04/17/2017 1404 Last data filed at 04/17/2017 1300 Gross per 24 hour  Intake 970 ml  Output 1350 ml  Net -380 ml   Filed Weights   04/14/17 0500 04/16/17 1623 04/17/17 0500  Weight: 72.7 kg (160 lb 4.4 oz) 72 kg (158 lb 11.7 oz) 71 kg (156 lb 8.4 oz)    Examination: General exam: Appears comfortable - quite sleepy when evaluated this AM HEENT: PERRLA, oral mucosa moist, no sclera icterus or  thrush Respiratory system: Clear to auscultation. Respiratory effort normal. Cardiovascular system: S1 & S2 heard, RRR.  No murmurs  Gastrointestinal system: Abdomen soft, non-tender, nondistended. Normal bowel sound. No organomegaly Central nervous system: sleepy- difficult to tell if oriented- will hold both arms up after I lift them for him but will not move legs for me or hold them up when I lift them for him Extremities: No cyanosis, clubbing - left arm swelling noted Skin: No rashes or ulcers      Data Reviewed: I have personally reviewed following labs and imaging studies  CBC: Recent Labs  Lab 04/12/17 1440 04/13/17 2214 04/16/17 0703  WBC 11.0* 14.1* 9.2  NEUTROABS 8.7*  --   --   HGB 14.7 12.6* 11.4*  HCT 43.6 38.4* 35.6*  MCV 87.6 88.5 89.4  PLT 182 157 164   Basic Metabolic Panel: Recent Labs  Lab 04/12/17 1440 04/13/17 2214 04/14/17 1851 04/15/17 0343 04/16/17 1632  NA 142 143  --  141  --   K 2.9* 3.8  --  3.9  --   CL 105 112*  --  112*  --   CO2 27 22  --  21*  --   GLUCOSE 95 151*  --  111*  --   BUN 15 17  --  14  --   CREATININE 0.92 1.18  --  0.84 0.94  CALCIUM 9.4 8.4*  --  8.7*  --   MG 1.5*  --  1.9  --   --   PHOS  --   --   --  2.9  --    GFR: Estimated Creatinine Clearance: 64.8 mL/min (by C-G formula based on SCr of 0.94 mg/dL). Liver Function Tests: Recent Labs  Lab 04/13/17 2214 04/15/17 0343  AST 61*  --   ALT 28  --   ALKPHOS 120  --   BILITOT 1.0  --   PROT 6.6  --   ALBUMIN 3.0* 2.4*   No results for input(s): LIPASE, AMYLASE in the last 168 hours. Recent Labs  Lab 04/16/17 1410 04/16/17 1632  AMMONIA 25 20   Coagulation Profile: No results for input(s): INR, PROTIME in the last 168 hours. Cardiac Enzymes: Recent Labs  Lab 04/12/17 2210 04/13/17 2214 04/14/17 0503 04/14/17 0858 04/15/17 0343  CKTOTAL 3,553*  --   --   --  868*  CKMB 4.4  --   --   --   --   TROPONINI  --  0.03* 0.04* 0.13*  --    BNP  (last 3 results) No results for input(s): PROBNP in the last 8760 hours. HbA1C: No results for input(s): HGBA1C in the last 72 hours. CBG: Recent Labs  Lab 04/13/17 2124 04/16/17 1303  GLUCAP 108* 102*  Lipid Profile: No results for input(s): CHOL, HDL, LDLCALC, TRIG, CHOLHDL, LDLDIRECT in the last 72 hours. Thyroid Function Tests: No results for input(s): TSH, T4TOTAL, FREET4, T3FREE, THYROIDAB in the last 72 hours. Anemia Panel: No results for input(s): VITAMINB12, FOLATE, FERRITIN, TIBC, IRON, RETICCTPCT in the last 72 hours. Urine analysis:    Component Value Date/Time   COLORURINE YELLOW 04/12/2017 1443   APPEARANCEUR CLEAR 04/12/2017 1443   LABSPEC 1.024 04/12/2017 1443   PHURINE 5.0 04/12/2017 1443   GLUCOSEU NEGATIVE 04/12/2017 1443   HGBUR SMALL (A) 04/12/2017 1443   BILIRUBINUR NEGATIVE 04/12/2017 1443   KETONESUR 20 (A) 04/12/2017 1443   PROTEINUR 30 (A) 04/12/2017 1443   UROBILINOGEN 1.0 03/16/2013 1602   NITRITE NEGATIVE 04/12/2017 1443   LEUKOCYTESUR NEGATIVE 04/12/2017 1443   Sepsis Labs: @LABRCNTIP (procalcitonin:4,lacticidven:4) ) Recent Results (from the past 240 hour(s))  Culture, blood (Routine X 2) w Reflex to ID Panel     Status: None (Preliminary result)   Collection Time: 04/12/17  7:07 PM  Result Value Ref Range Status   Specimen Description BLOOD LEFT ANTECUBITAL  Final   Special Requests   Final    BOTTLES DRAWN AEROBIC AND ANAEROBIC Blood Culture results may not be optimal due to an inadequate volume of blood received in culture bottles   Culture   Final    NO GROWTH 4 DAYS Performed at [facility], [address] [place], [place] [address]    Report Status PENDING  Incomplete  Culture, blood (Routine X 2) w Reflex to ID Panel     Status: None (Preliminary result)   Collection Time: 04/12/17 10:10 PM  Result Value Ref Range Status   Specimen Description BLOOD RIGHT HAND  Final   Special Requests IN PEDIATRIC BOTTLE Blood Culture  adequate volume  Final   Culture   Final    NO GROWTH 4 DAYS Performed at [facility], [address] [place], [place] [address]    Report Status PENDING  Incomplete  Body fluid culture     Status: None   Collection Time: 04/13/17  4:03 PM  Result Value Ref Range Status   Specimen Description FLUID INTERVERTEBRAL DISC  Final   Special Requests NONE  Final   Gram Stain   Final    FEW WBC PRESENT,BOTH PMN AND MONONUCLEAR NO ORGANISMS SEEN    Culture   Final    FEW STAPHYLOCOCCUS SPECIES (COAGULASE NEGATIVE) NO ANAEROBES ISOLATED CRITICAL RESULT CALLED TO, READ BACK BY AND VERIFIED WITH: L TURNER,RN AT 1113 04/15/17 BY L BENFIELD CONCERNING GROWTH ON CULTURE Performed at [facility], [address] [place], [place] [address]    Report Status 04/16/2017 FINAL  Final   Organism ID, Bacteria STAPHYLOCOCCUS SPECIES (COAGULASE NEGATIVE)  Final      Susceptibility   Staphylococcus species (coagulase negative) - MIC*    CIPROFLOXACIN INTERMEDIATE Intermediate     ERYTHROMYCIN >=8 RESISTANT Resistant     GENTAMICIN <=0.5 SENSITIVE Sensitive     OXACILLIN <=0.25 SENSITIVE Sensitive     TETRACYCLINE <=1 SENSITIVE Sensitive     VANCOMYCIN <=0.5 SENSITIVE Sensitive     TRIMETH/SULFA <=10 SENSITIVE Sensitive     CLINDAMYCIN RESISTANT Resistant     RIFAMPIN <=0.5 SENSITIVE Sensitive     Inducible Clindamycin POSITIVE Resistant     * FEW STAPHYLOCOCCUS SPECIES (COAGULASE NEGATIVE)  MRSA PCR Screening     Status: None   Collection Time: 04/14/17  3:59 AM  Result Value Ref  Range Status   MRSA by PCR NEGATIVE NEGATIVE Final    Comment:        The GeneXpert MRSA Assay (FDA approved for NASAL specimens only), is one component of a comprehensive MRSA colonization surveillance program. It is not intended to diagnose MRSA infection nor to guide or monitor treatment for MRSA infections.          Radiology Studies: Mr Laqueta Jean XL Contrast  Result Date:  04/16/2017 CLINICAL DATA:  Lethargy. Facial droop. 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 EXAM: MRI HEAD WITHOUT AND WITH CONTRAST MRI CERVICAL SPINE WITHOUT AND WITH CONTRAST MRI THORACIC SPINE WITHOUT AND WITH CONTRAST MRI LUMBAR SPINE WITHOUT AND WITH CONTRAST TECHNIQUE: Multiplanar, multiecho pulse sequences of the brain and surrounding structures, cervical spine, to include the craniocervical junction and cervicothoracic junction, and thoracic and lumbar spine were obtained without and with intravenous contrast. CONTRAST:  15mL MULTIHANCE GADOBENATE DIMEGLUMINE 529 MG/ML IV SOLN COMPARISON:  Head CT 04/16/2017. Brain MRI 07/22/2015. Chest CTA 04/14/2017. Lumbar spine MRI 04/12/2017. Cervical spine CT 07/21/2015. 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 There is no evidence of acute supratentorial infarct. A chronic microhemorrhage in the left temporal lobe is unchanged from the prior MRI. No mass, midline shift, or extra-axial fluid collection is seen. There is moderate cerebral atrophy. Patchy T2 hyperintensities in the cerebral white matter and pons are similar to the prior MRI and nonspecific but compatible with moderate chronic small vessel ischemic disease. 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 No abnormal enhancement is identified, however postcontrast imaging is motion degraded, severely so on the axial sequence. Vascular: Major intracranial vascular flow voids are preserved. 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 Skull and upper cervical spine: Unremarkable bone marrow signal. Sinuses/Orbits: Left cataract extraction. Moderate left ethmoid air cell mucosal thickening. Mild left frontal sinus mucosal thickening. Moderate right mastoid effusion.  Other: None. MRI CERVICAL SPINE FINDINGS Multiple sequences are moderately motion degraded. Alignment: Cervical spine straightening. At most minimal retrolisthesis of C6 on C7 and minimal anterolisthesis of C7 on T1. Slight left convex curvature of the cervical spine. Vertebrae: No evidence of fracture or air discitis/ osteomyelitis. Multilevel degenerative marrow changes, greatest along the C5 inferior endplate. No epidural fluid collection. Cord: Normal signal and morphology. No abnormal intradural enhancement. Posterior Fossa, vertebral arteries, paraspinal tissues: Grossly preserved vertebral artery flow voids in the upper neck. No paraspinal fluid collection. Disc levels: Moderate to severe disc space narrowing throughout the cervical spine with exception of C7-T1. C2-3: Mild uncovertebral spurring and mild right facet arthrosis without significant stenosis. C3-4: Uncovertebral spurring and minimal disc bulging without stenosis. C4-5: Broad-based posterior disc osteophyte complex results in mild-to-moderate left neural foraminal stenosis and mild spinal stenosis. C5-6: Broad-based posterior disc osteophyte complex results in moderate bilateral neural foraminal stenosis without significant spinal stenosis. C6-7: Broad-based posterior disc osteophyte complex results in borderline to mild spinal stenosis and severe bilateral neural foraminal stenosis. C7-T1: Moderate right facet arthrosis result in moderate to severe right neural foraminal stenosis. No spinal stenosis. MRI THORACIC SPINE FINDINGS Multiple sequences are moderately motion degraded. Alignment:  Normal. Vertebrae: No evidence of fracture or discitis. No suspicious osseous lesion. No epidural fluid collection. Cord: Normal signal and morphology. No abnormal intradural enhancement. Paraspinal and other soft tissues:  Small bilateral pleural effusions. Disc levels: Preserved disc space heights throughout the thoracic spine. No sizable disc herniation  identified. Widely patent spinal canal and neural foramina throughout. MRI LUMBAR SPINE FINDINGS The study is mildly to moderately motion degraded. Segmentation:  Standard. Alignment: Unchanged trace retrolisthesis of L2 on L3, grade 1 retrolisthesis of L3 on L4, and grade 1 anterolisthesis of L4 on L5. Vertebrae: Prior L5-S1 PLIF with stable fatty marrow changes. Similar appearance of abnormal fluid signal throughout the L3-4 disc space, which is collapsed, with erosive changes and edema and enhancement about both endplates. Chronic severe disc space height loss at L4-5 without changes suggestive of discitis/ osteomyelitis at this level. No evidence of new lumbar spine infection. Conus medullaris: Extends to the L1-2 level and appears normal. Clumped appearance of the cauda equina nerve roots in the lower lumbar spine which could reflect arachnoiditis or be related to high-grade spinal stenosis more proximally. Paraspinal and other soft tissues: Similar appearance of bilateral paravertebral phlegmon at L3-4 involving both psoas muscles. No evidence of drainable fluid collection. Disc levels: Similar appearance of degenerative and postoperative changes, with detailed assessment deferred to the recent prior examination. Motion artifact on axial imaging limits assessment. Epidural enhancement is again noted at L3-4 without a discrete abscess identified. Multifactorial spinal stenosis is moderate at L2-3, severe at L3-4, and moderate to severe at L4-5. There is severe bilateral neural foraminal stenosis at L3-4. IMPRESSION: 1. 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 2. 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 3. No evidence of infection in the cervical or thoracic spine. 4. 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 5. Multilevel cervical neural foraminal stenosis, severe bilaterally at C6-7. 6. Unchanged appearance of L3-4  discitis/osteomyelitis with paravertebral phlegmon but no discrete paraspinal or epidural abscess. Severe spinal stenosis. 7. No evidence of new infection in the lumbar spine. Electronically Signed   By: Sebastian AcheAllen  Grady M.D.   On: 04/16/2017 21:10   Mr Cervical Spine W Wo Contrast  Result Date: 04/16/2017 CLINICAL DATA:  Lethargy. Facial droop. 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 EXAM: MRI HEAD WITHOUT AND WITH CONTRAST MRI CERVICAL SPINE WITHOUT AND WITH CONTRAST MRI THORACIC SPINE WITHOUT AND WITH CONTRAST MRI LUMBAR SPINE WITHOUT AND WITH CONTRAST TECHNIQUE: Multiplanar, multiecho pulse sequences of the brain and surrounding structures, cervical spine, to include the craniocervical junction and cervicothoracic junction, and thoracic and lumbar spine were obtained without and with intravenous contrast. CONTRAST:  15mL MULTIHANCE GADOBENATE DIMEGLUMINE 529 MG/ML IV SOLN COMPARISON:  Head CT 04/16/2017. Brain MRI 07/22/2015. Chest CTA 04/14/2017. Lumbar spine MRI 04/12/2017. Cervical spine CT 07/21/2015. 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 There is no evidence of acute supratentorial infarct. A chronic microhemorrhage in the left temporal lobe is unchanged from the prior MRI. No mass, midline shift, or extra-axial fluid collection is seen. There is moderate cerebral atrophy. Patchy T2 hyperintensities in the cerebral white matter and pons are similar to the prior MRI and nonspecific but compatible with moderate chronic small vessel ischemic disease. 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 No abnormal enhancement is identified, however postcontrast imaging is motion degraded, severely so on the axial sequence. Vascular: Major intracranial vascular flow voids are  preserved. Intracranial arterial dolichoectasia  is again seen. Skull and upper cervical spine: Unremarkable bone marrow signal. Sinuses/Orbits: Left cataract extraction. Moderate left ethmoid air cell mucosal thickening. Mild left frontal sinus mucosal thickening. Moderate right mastoid effusion. Other: None. MRI CERVICAL SPINE FINDINGS Multiple sequences are moderately motion degraded. Alignment: Cervical spine straightening. At most minimal retrolisthesis of C6 on C7 and minimal anterolisthesis of C7 on T1. Slight left convex curvature of the cervical spine. Vertebrae: No evidence of fracture or air discitis/ osteomyelitis. Multilevel degenerative marrow changes, greatest along the C5 inferior endplate. No epidural fluid collection. Cord: Normal signal and morphology. No abnormal intradural enhancement. Posterior Fossa, vertebral arteries, paraspinal tissues: Grossly preserved vertebral artery flow voids in the upper neck. No paraspinal fluid collection. Disc levels: Moderate to severe disc space narrowing throughout the cervical spine with exception of C7-T1. C2-3: Mild uncovertebral spurring and mild right facet arthrosis without significant stenosis. C3-4: Uncovertebral spurring and minimal disc bulging without stenosis. C4-5: Broad-based posterior disc osteophyte complex results in mild-to-moderate left neural foraminal stenosis and mild spinal stenosis. C5-6: Broad-based posterior disc osteophyte complex results in moderate bilateral neural foraminal stenosis without significant spinal stenosis. C6-7: Broad-based posterior disc osteophyte complex results in borderline to mild spinal stenosis and severe bilateral neural foraminal stenosis. C7-T1: Moderate right facet arthrosis result in moderate to severe right neural foraminal stenosis. No spinal stenosis. MRI THORACIC SPINE FINDINGS Multiple sequences are moderately motion degraded. Alignment:  Normal. Vertebrae: No evidence of fracture or discitis. No  suspicious osseous lesion. No epidural fluid collection. Cord: Normal signal and morphology. No abnormal intradural enhancement. Paraspinal and other soft tissues: Small bilateral pleural effusions. Disc levels: Preserved disc space heights throughout the thoracic spine. No sizable disc herniation identified. Widely patent spinal canal and neural foramina throughout. MRI LUMBAR SPINE FINDINGS The study is mildly to moderately motion degraded. Segmentation:  Standard. Alignment: Unchanged trace retrolisthesis of L2 on L3, grade 1 retrolisthesis of L3 on L4, and grade 1 anterolisthesis of L4 on L5. Vertebrae: Prior L5-S1 PLIF with stable fatty marrow changes. Similar appearance of abnormal fluid signal throughout the L3-4 disc space, which is collapsed, with erosive changes and edema and enhancement about both endplates. Chronic severe disc space height loss at L4-5 without changes suggestive of discitis/ osteomyelitis at this level. No evidence of new lumbar spine infection. Conus medullaris: Extends to the L1-2 level and appears normal. Clumped appearance of the cauda equina nerve roots in the lower lumbar spine which could reflect arachnoiditis or be related to high-grade spinal stenosis more proximally. Paraspinal and other soft tissues: Similar appearance of bilateral paravertebral phlegmon at L3-4 involving both psoas muscles. No evidence of drainable fluid collection. Disc levels: Similar appearance of degenerative and postoperative changes, with detailed assessment deferred to the recent prior examination. Motion artifact on axial imaging limits assessment. Epidural enhancement is again noted at L3-4 without a discrete abscess identified. Multifactorial spinal stenosis is moderate at L2-3, severe at L3-4, and moderate to severe at L4-5. There is severe bilateral neural foraminal stenosis at L3-4. IMPRESSION: 1. 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 2. Moderate chronic small vessel  ischemic disease and cerebral atrophy. 3. No evidence of infection in the cervical or thoracic spine. 4. 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 5. Multilevel cervical neural foraminal stenosis, severe bilaterally at C6-7. 6. 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 Severe spinal stenosis. 7. No evidence of new infection in the lumbar spine. Electronically  Signed   By: Sebastian Ache M.D.   On: 04/16/2017 21:10   Mr Thoracic Spine W Wo Contrast  Result Date: 04/16/2017 CLINICAL DATA:  Lethargy. Facial droop. 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 EXAM: MRI HEAD WITHOUT AND WITH CONTRAST MRI CERVICAL SPINE WITHOUT AND WITH CONTRAST MRI THORACIC SPINE WITHOUT AND WITH CONTRAST MRI LUMBAR SPINE WITHOUT AND WITH CONTRAST TECHNIQUE: Multiplanar, multiecho pulse sequences of the brain and surrounding structures, cervical spine, to include the craniocervical junction and cervicothoracic junction, and thoracic and lumbar spine were obtained without and with intravenous contrast. CONTRAST:  15mL MULTIHANCE GADOBENATE DIMEGLUMINE 529 MG/ML IV SOLN COMPARISON:  Head CT 04/16/2017. Brain MRI 07/22/2015. Chest CTA 04/14/2017. Lumbar spine MRI 04/12/2017. Cervical spine CT 07/21/2015. 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 There is no evidence of acute supratentorial infarct. A chronic microhemorrhage in the left temporal lobe is unchanged from the prior MRI. No mass, midline shift, or extra-axial fluid collection is seen. There is moderate cerebral atrophy. Patchy T2 hyperintensities in the cerebral white matter and pons are similar to the prior MRI and nonspecific but compatible with moderate chronic small vessel ischemic disease. 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 No abnormal enhancement is identified, however postcontrast imaging is motion degraded, severely so on the axial sequence. Vascular: Major intracranial vascular flow voids are preserved. 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 Skull and upper cervical spine: Unremarkable bone marrow signal. Sinuses/Orbits: Left cataract extraction. Moderate left ethmoid air cell mucosal thickening. Mild left frontal sinus mucosal thickening. Moderate right mastoid effusion. Other: None. MRI CERVICAL SPINE FINDINGS Multiple sequences are moderately motion degraded. Alignment: Cervical spine straightening. At most minimal retrolisthesis of C6 on C7 and minimal anterolisthesis of C7 on T1. Slight left convex curvature of the cervical spine. Vertebrae: No evidence of fracture or air discitis/ osteomyelitis. Multilevel degenerative marrow changes, greatest along the C5 inferior endplate. No epidural fluid collection. Cord: Normal signal and morphology. No abnormal intradural enhancement. Posterior Fossa, vertebral arteries, paraspinal tissues: Grossly preserved vertebral artery flow voids in the upper neck. No paraspinal fluid collection. Disc levels: Moderate to severe disc space narrowing throughout the cervical spine with exception of C7-T1. C2-3: Mild uncovertebral spurring and mild right facet arthrosis without significant stenosis. C3-4: Uncovertebral spurring and minimal disc bulging without stenosis. C4-5: Broad-based posterior disc osteophyte complex results in mild-to-moderate left neural foraminal stenosis and mild spinal stenosis. C5-6: Broad-based posterior disc osteophyte complex results in moderate bilateral neural foraminal stenosis without significant spinal stenosis. C6-7: Broad-based posterior disc osteophyte complex results in borderline to mild spinal stenosis and severe bilateral neural  foraminal stenosis. C7-T1: Moderate right facet arthrosis result in moderate to severe right neural foraminal stenosis. No spinal stenosis. MRI THORACIC SPINE FINDINGS Multiple sequences are moderately motion degraded. Alignment:  Normal. Vertebrae: No evidence of fracture or discitis. No suspicious osseous lesion. No epidural fluid collection. Cord: Normal signal and morphology. No abnormal intradural enhancement. Paraspinal and other soft tissues: Small bilateral pleural effusions. Disc levels: Preserved disc space heights throughout the thoracic spine. No sizable disc herniation identified. Widely patent spinal canal and neural foramina throughout. MRI LUMBAR SPINE FINDINGS The study is mildly to moderately motion degraded. Segmentation:  Standard. Alignment: Unchanged trace retrolisthesis of L2 on L3, grade 1 retrolisthesis  of L3 on L4, and grade 1 anterolisthesis of L4 on L5. Vertebrae: Prior L5-S1 PLIF with stable fatty marrow changes. Similar appearance of abnormal fluid signal throughout the L3-4 disc space, which is collapsed, with erosive changes and edema and enhancement about both endplates. Chronic severe disc space height loss at L4-5 without changes suggestive of discitis/ osteomyelitis at this level. No evidence of new lumbar spine infection. Conus medullaris: Extends to the L1-2 level and appears normal. Clumped appearance of the cauda equina nerve roots in the lower lumbar spine which could reflect arachnoiditis or be related to high-grade spinal stenosis more proximally. Paraspinal and other soft tissues: Similar appearance of bilateral paravertebral phlegmon at L3-4 involving both psoas muscles. No evidence of drainable fluid collection. Disc levels: Similar appearance of degenerative and postoperative changes, with detailed assessment deferred to the recent prior examination. Motion artifact on axial imaging limits assessment. Epidural enhancement is again noted at L3-4 without a discrete  abscess identified. Multifactorial spinal stenosis is moderate at L2-3, severe at L3-4, and moderate to severe at L4-5. There is severe bilateral neural foraminal stenosis at L3-4. IMPRESSION: 1. 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 2. 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 3. No evidence of infection in the cervical or thoracic spine. 4. 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 5. Multilevel cervical neural foraminal stenosis, severe bilaterally at C6-7. 6. 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 Severe spinal stenosis. 7. No evidence of new infection in the lumbar spine. Electronically Signed   By: Sebastian Ache M.D.   On: 04/16/2017 21:10   Mr Lumbar Spine W Wo Contrast  Result Date: 04/16/2017 CLINICAL DATA:  Lethargy. Facial droop. 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 EXAM: MRI HEAD WITHOUT AND WITH CONTRAST MRI CERVICAL SPINE WITHOUT AND WITH CONTRAST MRI THORACIC SPINE WITHOUT AND WITH CONTRAST MRI LUMBAR SPINE WITHOUT AND WITH CONTRAST TECHNIQUE: Multiplanar, multiecho pulse sequences of the brain and surrounding structures, cervical spine, to include the craniocervical junction and cervicothoracic junction, and thoracic and lumbar spine were obtained without and with intravenous contrast. CONTRAST:  15mL MULTIHANCE GADOBENATE DIMEGLUMINE 529 MG/ML IV SOLN COMPARISON:  Head CT 04/16/2017. Brain MRI 07/22/2015. Chest CTA 04/14/2017. Lumbar spine MRI 04/12/2017. Cervical spine CT 07/21/2015. 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 There is no evidence of acute supratentorial infarct. A chronic microhemorrhage in the left temporal lobe is unchanged from the prior MRI. No mass, midline shift, or extra-axial fluid collection is seen. There is moderate  cerebral atrophy. Patchy T2 hyperintensities in the cerebral white matter and pons are similar to the prior MRI and nonspecific but compatible with moderate chronic small vessel ischemic disease. 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 No abnormal enhancement is identified, however postcontrast imaging is motion degraded, severely so on the axial sequence. Vascular: Major intracranial vascular flow voids are preserved. 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 Skull and upper cervical spine: Unremarkable bone marrow signal. Sinuses/Orbits: Left cataract extraction. Moderate left ethmoid air cell mucosal thickening. Mild left frontal sinus mucosal thickening. Moderate right mastoid effusion. Other: None. MRI CERVICAL SPINE FINDINGS Multiple sequences are moderately motion degraded. Alignment: Cervical spine straightening. At most minimal retrolisthesis of  C6 on C7 and minimal anterolisthesis of C7 on T1. Slight left convex curvature of the cervical spine. Vertebrae: No evidence of fracture or air discitis/ osteomyelitis. Multilevel degenerative marrow changes, greatest along the C5 inferior endplate. No epidural fluid collection. Cord: Normal signal and morphology. No abnormal intradural enhancement. Posterior Fossa, vertebral arteries, paraspinal tissues: Grossly preserved vertebral artery flow voids in the upper neck. No paraspinal fluid collection. Disc levels: Moderate to severe disc space narrowing throughout the cervical spine with exception of C7-T1. C2-3: Mild uncovertebral spurring and mild right facet arthrosis without significant stenosis. C3-4: Uncovertebral spurring and minimal disc bulging without stenosis. C4-5: Broad-based posterior disc osteophyte complex results in mild-to-moderate left neural foraminal stenosis and mild  spinal stenosis. C5-6: Broad-based posterior disc osteophyte complex results in moderate bilateral neural foraminal stenosis without significant spinal stenosis. C6-7: Broad-based posterior disc osteophyte complex results in borderline to mild spinal stenosis and severe bilateral neural foraminal stenosis. C7-T1: Moderate right facet arthrosis result in moderate to severe right neural foraminal stenosis. No spinal stenosis. MRI THORACIC SPINE FINDINGS Multiple sequences are moderately motion degraded. Alignment:  Normal. Vertebrae: No evidence of fracture or discitis. No suspicious osseous lesion. No epidural fluid collection. Cord: Normal signal and morphology. No abnormal intradural enhancement. Paraspinal and other soft tissues: Small bilateral pleural effusions. Disc levels: Preserved disc space heights throughout the thoracic spine. No sizable disc herniation identified. Widely patent spinal canal and neural foramina throughout. MRI LUMBAR SPINE FINDINGS The study is mildly to moderately motion degraded. Segmentation:  Standard. Alignment: Unchanged trace retrolisthesis of L2 on L3, grade 1 retrolisthesis of L3 on L4, and grade 1 anterolisthesis of L4 on L5. Vertebrae: Prior L5-S1 PLIF with stable fatty marrow changes. Similar appearance of abnormal fluid signal throughout the L3-4 disc space, which is collapsed, with erosive changes and edema and enhancement about both endplates. Chronic severe disc space height loss at L4-5 without changes suggestive of discitis/ osteomyelitis at this level. No evidence of new lumbar spine infection. Conus medullaris: Extends to the L1-2 level and appears normal. Clumped appearance of the cauda equina nerve roots in the lower lumbar spine which could reflect arachnoiditis or be related to high-grade spinal stenosis more proximally. Paraspinal and other soft tissues: Similar appearance of bilateral paravertebral phlegmon at L3-4 involving both psoas muscles. No evidence of  drainable fluid collection. Disc levels: Similar appearance of degenerative and postoperative changes, with detailed assessment deferred to the recent prior examination. Motion artifact on axial imaging limits assessment. Epidural enhancement is again noted at L3-4 without a discrete abscess identified. Multifactorial spinal stenosis is moderate at L2-3, severe at L3-4, and moderate to severe at L4-5. There is severe bilateral neural foraminal stenosis at L3-4. IMPRESSION: 1. 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 2. 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 3. No evidence of infection in the cervical or thoracic spine. 4. 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 5. Multilevel cervical neural foraminal stenosis, severe bilaterally at C6-7. 6. 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 Severe spinal stenosis. 7. No evidence of new infection in the lumbar spine. Electronically Signed   By: Sebastian Ache M.D.   On: 04/16/2017 21:10   Dg Chest Port 1 View  Result Date: 04/16/2017 CLINICAL DATA:  Rhonchi. EXAM: PORTABLE CHEST 1 VIEW COMPARISON:  04/15/2017 FINDINGS: The cardiomediastinal silhouette is unchanged. Aortic atherosclerosis is noted. Emphysema is again noted with asymmetric bullous changes  in the right lung apex. Chronic interstitial coarsening is unchanged. There is no evidence of acute airspace consolidation, edema, pleural effusion, or pneumothorax. IMPRESSION: COPD without evidence of acute abnormality. Electronically Signed   By: Sebastian AcheAllen  Grady M.D.   On: 04/16/2017 13:16   Ct Head Code Stroke Wo Contrast  Result Date: 04/16/2017 CLINICAL DATA:  Code stroke.  Facial droop. EXAM: CT HEAD WITHOUT CONTRAST TECHNIQUE: Contiguous axial images were obtained from the base of the skull through the vertex without intravenous contrast.  COMPARISON:  04/12/2017 FINDINGS: Brain: There is no evidence of acute infarct, intracranial hemorrhage, mass, midline shift, or extra-axial fluid collection. There is moderate cerebral and cerebellar atrophy. Patchy to confluent cerebral white matter hypodensities are unchanged and nonspecific but compatible with moderate chronic small vessel ischemic disease. There are chronic lacunar infarcts in the thalami and basal ganglia. Vascular: Calcified atherosclerosis at the skullbase. No hyperdense vessel. Skull: No fracture or focal osseous lesion. Sinuses/Orbits: Mild-to-moderate left frontal and left ethmoid sinus mucosal thickening. Clear mastoid air cells. Left cataract extraction. Other: None. ASPECTS [facility]([place] Stroke Program Early CT Score) - Ganglionic level infarction (caudate, lentiform nuclei, internal capsule, insula, M1-M3 cortex): 7 - Supraganglionic infarction (M4-M6 cortex): 3 Total score (0-10 with 10 being normal): 10 IMPRESSION: 1. No evidence of acute intracranial abnormality. 2. ASPECTS is 10. 3. 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 These results were communicated to Dr. Amada JupiterKirkpatrick at 3:13 pmon 12/16/2018by text page via the [facility] messaging system. Electronically Signed   By: Sebastian AcheAllen  Grady M.D.   On: 04/16/2017 15:14      Scheduled Meds: . amLODipine  10 mg Oral Daily  . cholecalciferol  2,000 Units Oral Daily  . clopidogrel  75 mg Oral Q breakfast  . doxazosin  8 mg Oral QHS  . feeding supplement (ENSURE ENLIVE)  237 mL Oral BID BM  . feeding supplement (PRO-STAT SUGAR FREE 64)  30 mL Oral BID  . finasteride  5 mg Oral Daily  . FLUoxetine  40 mg Oral Daily  . fluticasone  2 spray Each Nare QHS  . folic acid  1 mg Oral Daily  . loratadine  10 mg Oral Daily  . losartan  25 mg Oral Daily  . mouth rinse  15 mL Mouth Rinse BID  . Melatonin  1 tablet Oral QHS  . mirabegron ER  25 mg Oral Daily  . mirtazapine  30 mg Oral QHS  . pantoprazole  40 mg Oral  Daily  . senna  1 tablet Oral BID  . tiotropium  18 mcg Inhalation Daily   Continuous Infusions: . vancomycin Stopped (04/16/17 2305)     LOS: 5 days    Time spent in minutes: 35    Calvert CantorSaima Yolande Skoda, MD [facility] Pager: [email or web address] Password [email or web address] 04/17/2017, 2:04 PM

## 2017-04-18 DIAGNOSIS — M4640 Discitis, unspecified, site unspecified: Secondary | ICD-10-CM

## 2017-04-18 DIAGNOSIS — R0989 Other specified symptoms and signs involving the circulatory and respiratory systems: Secondary | ICD-10-CM

## 2017-04-18 MED ORDER — SODIUM CHLORIDE 0.9% FLUSH: 10.0000 mL | INTRAVENOUS | Status: DC | PRN

## 2017-04-18 MED ORDER — CHLORHEXIDINE GLUCONATE CLOTH 2 % EX PADS
6.0000 | MEDICATED_PAD | Freq: Every day | CUTANEOUS | Status: DC
  Administered 2017-04-18 – 2017-04-19 (×2): 6 via TOPICAL

## 2017-04-18 MED ORDER — SODIUM CHLORIDE 0.9% FLUSH
10.0000 mL | Freq: Two times a day (BID) | INTRAVENOUS | Status: DC
  Administered 2017-04-18: 40 mL
  Administered 2017-04-18 – 2017-04-20 (×4): 10 mL

## 2017-04-18 NOTE — Progress Notes (Signed)
PROGRESS NOTE    Norman Davis  WJX:[id number] DOB: Apr 19, 1941 DOA: 04/12/2017 PCP: Kirt Boys, DO   Chief Complaint  Patient presents with  . Weakness    Brief Narrative:  HPI on 04/12/2017 by Dr. Pearson Grippe Norman Davis  is a 76 y.o. male, w hx of Schizophrenia, Hypertension, Hyperlipidemia, Pagets, Norman Davis, Spinal stenosis, prior back surgery in remote past in [place] and ESI several months ago by Dr. Ollen Bowl presents w c/o inability to walk starting on Monday. He has been afebrile per his sister.  His nurse called EMS today due to his inability to walk. .  Pt was brought to ED for evaluation.   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 Neurosurgery felt this to be chronic.  Acute CVA -Noted on MRI: Punctate small vessel acute or early subacute infarct in the left cerebellum -Neurology consulted and appreciated, infarct may be related to cerebral vasospasm in the setting of inflammation -Continue Plavix  Lethargy -Neurontin, Remeron, Vicodin discontinued -Continue to monitor closely -Unsure of patient's baseline  Hypokalemia -last potassium on 04/15/17 3.9 -will order BMP   Essential hypertension -Continue amlodipine, losartan  BPH -Continue Cardura, Proscar, Mirabegron  Schizophrenia -Takes paliperidone q3 months -continue  prozac -Remeron held due to lethargy  Left arm edema -LUE venous dupplex: Acute superficial venous thrombosis in the cephalic vein  DVT Prophylaxis    Code Status: Full  Family Communication: None at bedside  Disposition Plan: Admitted.  Consultants Neurology Neurosurgery Infectious disease Interventional radiology  Procedures  Disc Aspiration under fluoro L3-L4 LUE doppler  Antibiotics   Anti-infectives (From admission, onward)   Start     Dose/Rate Route Frequency Ordered Stop   04/17/17 2000  cefTRIAXone (ROCEPHIN) 2 g in dextrose 5 % 50 mL IVPB     2 g 100 mL/hr over 30 Minutes Intravenous Every 12 hours 04/17/17 1445     04/16/17 2200  vancomycin (VANCOCIN) 1,250 mg in sodium chloride 0.9 % 250 mL IVPB  Status:  Discontinued     1,250 mg 166.7 mL/hr over 90 Minutes Intravenous Every 24 hours 04/16/17 1735 04/17/17 1445   04/13/17 1600  vancomycin (VANCOCIN) 1,250 mg in sodium chloride 0.9 % 250 mL IVPB  Status:  Discontinued     1,250 mg 166.7 mL/hr over 90 Minutes Intravenous Every 24 hours 04/12/17 1957 04/16/17 1735   04/13/17 1000  ceFEPIme (MAXIPIME) 2 g in dextrose 5 % 50 mL IVPB  Status:  Discontinued     2 g 100 mL/hr over 30 Minutes Intravenous Every 12 hours 04/12/17 1957 04/15/17 1646   04/12/17 2030  vancomycin (VANCOCIN) 1,500 mg in sodium chloride 0.9 % 500 mL IVPB     1,500 mg 250 mL/hr over 120 Minutes Intravenous  Once 04/12/17 1928 04/12/17 2345   04/12/17 2000  ceFEPIme (MAXIPIME) 2 g in dextrose 5 % 50 mL IVPB     2 g 100 mL/hr over 30 Minutes Intravenous  Once 04/12/17 1928 04/12/17 2142      Subjective:  Norman Davis seen and examined today.  Denies any further pain. Not sure of patient's baseline mental status. Has no complaints today.   Objective:   Vitals:   04/18/17 0541 04/18/17 0600 04/18/17 0825 04/18/17 0928  BP:  119/84 (!) 138/98   Pulse:  97 87   Resp:  15 17   Temp: 99.6 F (37.6 C)  98.8 F (37.1 C)   TempSrc: Oral   Oral   SpO2:  92% 96% 95%  Weight:      Height:        Intake/Output Summary (Last 24 hours) at 04/18/2017 1146 Last data filed at 04/18/2017 0925 Gross per 24 hour  Intake 1540 ml  Output 1100 ml  Net 440 ml   Filed Weights   04/16/17 1623 04/17/17 0500 04/18/17 0245  Weight: 72 kg (158 lb 11.7 oz) 71 kg (156 lb 8.4 oz) 75.2 kg (165 lb 12.6 oz)    Exam  General: Well developed, well nourished, NAD, appears stated age  HEENT: NCAT, mucous membranes moist.   Cardiovascular: S1 S2 auscultated, no rubs, murmurs or gallops. Regular rate and rhythm.  Respiratory: Clear to auscultation bilaterally with equal chest rise  Abdomen: Soft, nontender, mildly distended, + bowel sounds  Extremities: warm dry without cyanosis clubbing or edema of LE. LUE edema  Neuro: AAOx2, can hold upper arms up, however not legs, mild pedal movement R >L  Psych: appropriate  Data Reviewed: I have personally reviewed following labs and imaging studies  CBC: Recent Labs  Lab 04/12/17 1440 04/13/17 2214 04/16/17 0703  WBC 11.0* 14.1* 9.2  NEUTROABS 8.7*  --   --   HGB 14.7 12.6* 11.4*  HCT 43.6 38.4* 35.6*  MCV 87.6 88.5 89.4  PLT 182 157 164   Basic Metabolic Panel: Recent Labs  Lab 04/12/17 1440 04/13/17 2214 04/14/17 1851 04/15/17 0343 04/16/17 1632  NA 142 143  --  141  --   K 2.9* 3.8  --  3.9  --   CL 105 112*  --  112*  --   CO2 27 22  --  21*  --   GLUCOSE 95 151*  --  111*  --   BUN 15 17  --  14  --   CREATININE 0.92 1.18  --  0.84 0.94  CALCIUM 9.4 8.4*  --  8.7*  --   MG 1.5*  --  1.9  --   --   PHOS  --   --   --  2.9  --    GFR: Estimated Creatinine Clearance: 64.8 mL/min (by C-G formula based on SCr of 0.94 mg/dL). Liver Function Tests: Recent Labs  Lab 04/13/17 2214 04/15/17 0343  AST 61*  --   ALT 28  --   ALKPHOS 120  --   BILITOT 1.0  --   PROT 6.6  --   ALBUMIN 3.0* 2.4*   No results for input(s): LIPASE, AMYLASE in the last 168 hours. Recent  Labs  Lab 04/16/17 1410 04/16/17 1632  AMMONIA 25 20   Coagulation Profile: No results for input(s): INR, PROTIME in the last 168 hours. Cardiac Enzymes: Recent Labs  Lab 04/12/17 2210 04/13/17 2214 04/14/17 0503 04/14/17 0858 04/15/17 0343  CKTOTAL 3,553*  --   --   --  868*  CKMB 4.4  --   --   --   --   TROPONINI  --  0.03* 0.04* 0.13*  --    BNP (last 3 results)  No results for input(s): PROBNP in the last 8760 hours. HbA1C: No results for input(s): HGBA1C in the last 72 hours. CBG: Recent Labs  Lab 04/13/17 2124 04/16/17 1303  GLUCAP 108* 102*   Lipid Profile: No results for input(s): CHOL, HDL, LDLCALC, TRIG, CHOLHDL, LDLDIRECT in the last 72 hours. Thyroid Function Tests: No results for input(s): TSH, T4TOTAL, FREET4, T3FREE, THYROIDAB in the last 72 hours. Anemia Panel: No results for input(s): VITAMINB12, FOLATE, FERRITIN, TIBC, IRON, RETICCTPCT in the last 72 hours. Urine analysis:    Component Value Date/Time   COLORURINE YELLOW 04/12/2017 1443   APPEARANCEUR CLEAR 04/12/2017 1443   LABSPEC 1.024 04/12/2017 1443   PHURINE 5.0 04/12/2017 1443   GLUCOSEU NEGATIVE 04/12/2017 1443   HGBUR SMALL (A) 04/12/2017 1443   BILIRUBINUR NEGATIVE 04/12/2017 1443   KETONESUR 20 (A) 04/12/2017 1443   PROTEINUR 30 (A) 04/12/2017 1443   UROBILINOGEN 1.0 03/16/2013 1602   NITRITE NEGATIVE 04/12/2017 1443   LEUKOCYTESUR NEGATIVE 04/12/2017 1443   Sepsis Labs: @LABRCNTIP (procalcitonin:4,lacticidven:4)  ) Recent Results (from the past 240 hour(s))  Culture, blood (Routine X 2) w Reflex to ID Panel     Status: None   Collection Time: 04/12/17  7:07 PM  Result Value Ref Range Status   Specimen Description BLOOD LEFT ANTECUBITAL  Final   Special Requests   Final    BOTTLES DRAWN AEROBIC AND ANAEROBIC Blood Culture results may not be optimal due to an inadequate volume of blood received in culture bottles   Culture   Final    NO GROWTH 5 DAYS Performed at [facility], [address] [place], [place] [address]    Report Status 04/17/2017 FINAL  Final  Culture, blood (Routine X 2) w Reflex to ID Panel     Status: None   Collection Time: 04/12/17 10:10 PM  Result Value Ref Range Status   Specimen Description BLOOD RIGHT HAND  Final   Special Requests IN PEDIATRIC BOTTLE Blood Culture adequate volume  Final   Culture   Final    NO GROWTH 5 DAYS Performed at [facility], [address] [place], [place] [address]    Report Status 04/17/2017 FINAL  Final  Body fluid culture     Status: None   Collection Time: 04/13/17  4:03 PM  Result Value Ref Range Status   Specimen Description FLUID INTERVERTEBRAL DISC  Final   Special Requests NONE  Final   Gram Stain   Final    FEW WBC PRESENT,BOTH PMN AND MONONUCLEAR NO ORGANISMS SEEN    Culture   Final    FEW STAPHYLOCOCCUS SPECIES (COAGULASE NEGATIVE) NO ANAEROBES ISOLATED CRITICAL RESULT CALLED TO, READ BACK BY AND VERIFIED WITH: L TURNER,RN AT 1113 04/15/17 BY L BENFIELD CONCERNING GROWTH ON CULTURE Performed at [facility], [address] [place], [place] [address]    Report Status 04/16/2017 FINAL  Final   Organism ID, Bacteria STAPHYLOCOCCUS SPECIES (COAGULASE NEGATIVE)  Final      Susceptibility   Staphylococcus species (coagulase negative) - MIC*    CIPROFLOXACIN INTERMEDIATE Intermediate     ERYTHROMYCIN >=8 RESISTANT Resistant     GENTAMICIN <=0.5 SENSITIVE Sensitive     OXACILLIN <=0.25 SENSITIVE Sensitive     TETRACYCLINE <=1 SENSITIVE Sensitive     VANCOMYCIN <=0.5 SENSITIVE Sensitive     TRIMETH/SULFA <=10 SENSITIVE Sensitive     CLINDAMYCIN RESISTANT Resistant     RIFAMPIN <=0.5 SENSITIVE Sensitive     Inducible  Clindamycin POSITIVE Resistant     * FEW STAPHYLOCOCCUS SPECIES (COAGULASE NEGATIVE)  MRSA PCR Screening     Status: None   Collection Time: 04/14/17  3:59 AM  Result Value Ref Range Status   MRSA by PCR NEGATIVE NEGATIVE Final    Comment:        The  GeneXpert MRSA Assay (FDA approved for NASAL specimens only), is one component of a comprehensive MRSA colonization surveillance program. It is not intended to diagnose MRSA infection nor to guide or monitor treatment for MRSA infections.       Radiology Studies: Mr Laqueta Jean ZO Contrast  Result Date: 04/16/2017 CLINICAL DATA:  Lethargy. Facial droop. 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 EXAM: MRI HEAD WITHOUT AND WITH CONTRAST MRI CERVICAL SPINE WITHOUT AND WITH CONTRAST MRI THORACIC SPINE WITHOUT AND WITH CONTRAST MRI LUMBAR SPINE WITHOUT AND WITH CONTRAST TECHNIQUE: Multiplanar, multiecho pulse sequences of the brain and surrounding structures, cervical spine, to include the craniocervical junction and cervicothoracic junction, and thoracic and lumbar spine were obtained without and with intravenous contrast. CONTRAST:  15mL MULTIHANCE GADOBENATE DIMEGLUMINE 529 MG/ML IV SOLN COMPARISON:  Head CT 04/16/2017. Brain MRI 07/22/2015. Chest CTA 04/14/2017. Lumbar spine MRI 04/12/2017. Cervical spine CT 07/21/2015. 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 There is no evidence of acute supratentorial infarct. A chronic microhemorrhage in the left temporal lobe is unchanged from the prior MRI. No mass, midline shift, or extra-axial fluid collection is seen. There is moderate cerebral atrophy. Patchy T2 hyperintensities in the cerebral white matter and pons are similar to the prior MRI and nonspecific but compatible with moderate chronic small vessel ischemic disease. 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 No abnormal enhancement is identified, however postcontrast imaging is motion degraded, severely so on the axial sequence. Vascular: Major intracranial  vascular flow voids are preserved. 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 Skull and upper cervical spine: Unremarkable bone marrow signal. Sinuses/Orbits: Left cataract extraction. Moderate left ethmoid air cell mucosal thickening. Mild left frontal sinus mucosal thickening. Moderate right mastoid effusion. Other: None. MRI CERVICAL SPINE FINDINGS Multiple sequences are moderately motion degraded. Alignment: Cervical spine straightening. At most minimal retrolisthesis of C6 on C7 and minimal anterolisthesis of C7 on T1. Slight left convex curvature of the cervical spine. Vertebrae: No evidence of fracture or air discitis/ osteomyelitis. Multilevel degenerative marrow changes, greatest along the C5 inferior endplate. No epidural fluid collection. Cord: Normal signal and morphology. No abnormal intradural enhancement. Posterior Fossa, vertebral arteries, paraspinal tissues: Grossly preserved vertebral artery flow voids in the upper neck. No paraspinal fluid collection. Disc levels: Moderate to severe disc space narrowing throughout the cervical spine with exception of C7-T1. C2-3: Mild uncovertebral spurring and mild right facet arthrosis without significant stenosis. C3-4: Uncovertebral spurring and minimal disc bulging without stenosis. C4-5: Broad-based posterior disc osteophyte complex results in mild-to-moderate left neural foraminal stenosis and mild spinal stenosis. C5-6: Broad-based posterior disc osteophyte complex results in moderate bilateral neural foraminal stenosis without significant spinal stenosis. C6-7: Broad-based posterior disc osteophyte complex results in borderline to mild spinal stenosis and severe bilateral neural foraminal stenosis. C7-T1: Moderate right facet arthrosis result in moderate to severe right neural foraminal stenosis. No spinal stenosis. MRI THORACIC SPINE FINDINGS Multiple sequences are moderately motion degraded.  Alignment:  Normal. Vertebrae: No evidence of  fracture or discitis. No suspicious osseous lesion. No epidural fluid collection. Cord: Normal signal and morphology. No abnormal intradural enhancement. Paraspinal and other soft tissues: Small bilateral pleural effusions. Disc levels: Preserved disc space heights throughout the thoracic spine. No sizable disc herniation identified. Widely patent spinal canal and neural foramina throughout. MRI LUMBAR SPINE FINDINGS The study is mildly to moderately motion degraded. Segmentation:  Standard. Alignment: Unchanged trace retrolisthesis of L2 on L3, grade 1 retrolisthesis of L3 on L4, and grade 1 anterolisthesis of L4 on L5. Vertebrae: Prior L5-S1 PLIF with stable fatty marrow changes. Similar appearance of abnormal fluid signal throughout the L3-4 disc space, which is collapsed, with erosive changes and edema and enhancement about both endplates. Chronic severe disc space height loss at L4-5 without changes suggestive of discitis/ osteomyelitis at this level. No evidence of new lumbar spine infection. Conus medullaris: Extends to the L1-2 level and appears normal. Clumped appearance of the cauda equina nerve roots in the lower lumbar spine which could reflect arachnoiditis or be related to high-grade spinal stenosis more proximally. Paraspinal and other soft tissues: Similar appearance of bilateral paravertebral phlegmon at L3-4 involving both psoas muscles. No evidence of drainable fluid collection. Disc levels: Similar appearance of degenerative and postoperative changes, with detailed assessment deferred to the recent prior examination. Motion artifact on axial imaging limits assessment. Epidural enhancement is again noted at L3-4 without a discrete abscess identified. Multifactorial spinal stenosis is moderate at L2-3, severe at L3-4, and moderate to severe at L4-5. There is severe bilateral neural foraminal stenosis at L3-4. IMPRESSION: 1. 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 2.  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 3. No evidence of infection in the cervical or thoracic spine. 4. 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 5. Multilevel cervical neural foraminal stenosis, severe bilaterally at C6-7. 6. 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 Severe spinal stenosis. 7. No evidence of new infection in the lumbar spine. Electronically Signed   By: Sebastian AcheAllen  Grady M.D.   On: 04/16/2017 21:10   Mr Cervical Spine W Wo Contrast  Result Date: 04/16/2017 CLINICAL DATA:  Lethargy. Facial droop. 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 EXAM: MRI HEAD WITHOUT AND WITH CONTRAST MRI CERVICAL SPINE WITHOUT AND WITH CONTRAST MRI THORACIC SPINE WITHOUT AND WITH CONTRAST MRI LUMBAR SPINE WITHOUT AND WITH CONTRAST TECHNIQUE: Multiplanar, multiecho pulse sequences of the brain and surrounding structures, cervical spine, to include the craniocervical junction and cervicothoracic junction, and thoracic and lumbar spine were obtained without and with intravenous contrast. CONTRAST:  15mL MULTIHANCE GADOBENATE DIMEGLUMINE 529 MG/ML IV SOLN COMPARISON:  Head CT 04/16/2017. Brain MRI 07/22/2015. Chest CTA 04/14/2017. Lumbar spine MRI 04/12/2017. Cervical spine CT 07/21/2015. 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 There is no evidence of acute supratentorial infarct. A chronic microhemorrhage in the left temporal lobe is unchanged from the prior MRI. No mass, midline shift, or extra-axial fluid collection is seen. There is moderate cerebral atrophy. Patchy T2 hyperintensities in the cerebral white matter and pons are similar to the prior MRI and nonspecific but compatible with moderate chronic small vessel ischemic disease. A chronic lacunar white matter infarct in the high posterior right  frontal lobe is unchanged. Small chronic infarcts are again seen in both cerebellar hemispheres as well as in the  thalami and basal ganglia. No abnormal enhancement is identified, however postcontrast imaging is motion degraded, severely so on the axial sequence. Vascular: Major intracranial vascular flow voids are preserved. 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 Skull and upper cervical spine: Unremarkable bone marrow signal. Sinuses/Orbits: Left cataract extraction. Moderate left ethmoid air cell mucosal thickening. Mild left frontal sinus mucosal thickening. Moderate right mastoid effusion. Other: None. MRI CERVICAL SPINE FINDINGS Multiple sequences are moderately motion degraded. Alignment: Cervical spine straightening. At most minimal retrolisthesis of C6 on C7 and minimal anterolisthesis of C7 on T1. Slight left convex curvature of the cervical spine. Vertebrae: No evidence of fracture or air discitis/ osteomyelitis. Multilevel degenerative marrow changes, greatest along the C5 inferior endplate. No epidural fluid collection. Cord: Normal signal and morphology. No abnormal intradural enhancement. Posterior Fossa, vertebral arteries, paraspinal tissues: Grossly preserved vertebral artery flow voids in the upper neck. No paraspinal fluid collection. Disc levels: Moderate to severe disc space narrowing throughout the cervical spine with exception of C7-T1. C2-3: Mild uncovertebral spurring and mild right facet arthrosis without significant stenosis. C3-4: Uncovertebral spurring and minimal disc bulging without stenosis. C4-5: Broad-based posterior disc osteophyte complex results in mild-to-moderate left neural foraminal stenosis and mild spinal stenosis. C5-6: Broad-based posterior disc osteophyte complex results in moderate bilateral neural foraminal stenosis without significant spinal stenosis. C6-7: Broad-based posterior disc osteophyte complex results in borderline to mild spinal stenosis and  severe bilateral neural foraminal stenosis. C7-T1: Moderate right facet arthrosis result in moderate to severe right neural foraminal stenosis. No spinal stenosis. MRI THORACIC SPINE FINDINGS Multiple sequences are moderately motion degraded. Alignment:  Normal. Vertebrae: No evidence of fracture or discitis. No suspicious osseous lesion. No epidural fluid collection. Cord: Normal signal and morphology. No abnormal intradural enhancement. Paraspinal and other soft tissues: Small bilateral pleural effusions. Disc levels: Preserved disc space heights throughout the thoracic spine. No sizable disc herniation identified. Widely patent spinal canal and neural foramina throughout. MRI LUMBAR SPINE FINDINGS The study is mildly to moderately motion degraded. Segmentation:  Standard. Alignment: Unchanged trace retrolisthesis of L2 on L3, grade 1 retrolisthesis of L3 on L4, and grade 1 anterolisthesis of L4 on L5. Vertebrae: Prior L5-S1 PLIF with stable fatty marrow changes. Similar appearance of abnormal fluid signal throughout the L3-4 disc space, which is collapsed, with erosive changes and edema and enhancement about both endplates. Chronic severe disc space height loss at L4-5 without changes suggestive of discitis/ osteomyelitis at this level. No evidence of new lumbar spine infection. Conus medullaris: Extends to the L1-2 level and appears normal. Clumped appearance of the cauda equina nerve roots in the lower lumbar spine which could reflect arachnoiditis or be related to high-grade spinal stenosis more proximally. Paraspinal and other soft tissues: Similar appearance of bilateral paravertebral phlegmon at L3-4 involving both psoas muscles. No evidence of drainable fluid collection. Disc levels: Similar appearance of degenerative and postoperative changes, with detailed assessment deferred to the recent prior examination. Motion artifact on axial imaging limits assessment. Epidural enhancement is again noted at L3-4  without a discrete abscess identified. Multifactorial spinal stenosis is moderate at L2-3, severe at L3-4, and moderate to severe at L4-5. There is severe bilateral neural foraminal stenosis at L3-4. IMPRESSION: 1. 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 2. 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 3. No evidence of infection in the cervical or thoracic spine. 4. 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 5. Multilevel cervical neural foraminal stenosis, severe bilaterally  at C6-7. 6. 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 Severe spinal stenosis. 7. No evidence of new infection in the lumbar spine. Electronically Signed   By: Sebastian Ache M.D.   On: 04/16/2017 21:10   Mr Thoracic Spine W Wo Contrast  Result Date: 04/16/2017 CLINICAL DATA:  Lethargy. Facial droop. 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 EXAM: MRI HEAD WITHOUT AND WITH CONTRAST MRI CERVICAL SPINE WITHOUT AND WITH CONTRAST MRI THORACIC SPINE WITHOUT AND WITH CONTRAST MRI LUMBAR SPINE WITHOUT AND WITH CONTRAST TECHNIQUE: Multiplanar, multiecho pulse sequences of the brain and surrounding structures, cervical spine, to include the craniocervical junction and cervicothoracic junction, and thoracic and lumbar spine were obtained without and with intravenous contrast. CONTRAST:  15mL MULTIHANCE GADOBENATE DIMEGLUMINE 529 MG/ML IV SOLN COMPARISON:  Head CT 04/16/2017. Brain MRI 07/22/2015. Chest CTA 04/14/2017. Lumbar spine MRI 04/12/2017. Cervical spine CT 07/21/2015. 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 There is no evidence of acute supratentorial infarct. A chronic microhemorrhage in the left temporal lobe is unchanged from the prior MRI. No mass, midline shift, or extra-axial fluid collection is seen.  There is moderate cerebral atrophy. Patchy T2 hyperintensities in the cerebral white matter and pons are similar to the prior MRI and nonspecific but compatible with moderate chronic small vessel ischemic disease. 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 No abnormal enhancement is identified, however postcontrast imaging is motion degraded, severely so on the axial sequence. Vascular: Major intracranial vascular flow voids are preserved. 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 Skull and upper cervical spine: Unremarkable bone marrow signal. Sinuses/Orbits: Left cataract extraction. Moderate left ethmoid air cell mucosal thickening. Mild left frontal sinus mucosal thickening. Moderate right mastoid effusion. Other: None. MRI CERVICAL SPINE FINDINGS Multiple sequences are moderately motion degraded. Alignment: Cervical spine straightening. At most minimal retrolisthesis of C6 on C7 and minimal anterolisthesis of C7 on T1. Slight left convex curvature of the cervical spine. Vertebrae: No evidence of fracture or air discitis/ osteomyelitis. Multilevel degenerative marrow changes, greatest along the C5 inferior endplate. No epidural fluid collection. Cord: Normal signal and morphology. No abnormal intradural enhancement. Posterior Fossa, vertebral arteries, paraspinal tissues: Grossly preserved vertebral artery flow voids in the upper neck. No paraspinal fluid collection. Disc levels: Moderate to severe disc space narrowing throughout the cervical spine with exception of C7-T1. C2-3: Mild uncovertebral spurring and mild right facet arthrosis without significant stenosis. C3-4: Uncovertebral spurring and minimal disc bulging without stenosis. C4-5: Broad-based posterior disc osteophyte complex results in mild-to-moderate left neural foraminal  stenosis and mild spinal stenosis. C5-6: Broad-based posterior disc osteophyte complex results in moderate bilateral neural foraminal stenosis without significant spinal stenosis. C6-7: Broad-based posterior disc osteophyte complex results in borderline to mild spinal stenosis and severe bilateral neural foraminal stenosis. C7-T1: Moderate right facet arthrosis result in moderate to severe right neural foraminal stenosis. No spinal stenosis. MRI THORACIC SPINE FINDINGS Multiple sequences are moderately motion degraded. Alignment:  Normal. Vertebrae: No evidence of fracture or discitis. No suspicious osseous lesion. No epidural fluid collection. Cord: Normal signal and morphology. No abnormal intradural enhancement. Paraspinal and other soft tissues: Small bilateral pleural effusions. Disc levels: Preserved disc space heights throughout the thoracic spine. No sizable disc herniation identified. Widely patent  spinal canal and neural foramina throughout. MRI LUMBAR SPINE FINDINGS The study is mildly to moderately motion degraded. Segmentation:  Standard. Alignment: Unchanged trace retrolisthesis of L2 on L3, grade 1 retrolisthesis of L3 on L4, and grade 1 anterolisthesis of L4 on L5. Vertebrae: Prior L5-S1 PLIF with stable fatty marrow changes. Similar appearance of abnormal fluid signal throughout the L3-4 disc space, which is collapsed, with erosive changes and edema and enhancement about both endplates. Chronic severe disc space height loss at L4-5 without changes suggestive of discitis/ osteomyelitis at this level. No evidence of new lumbar spine infection. Conus medullaris: Extends to the L1-2 level and appears normal. Clumped appearance of the cauda equina nerve roots in the lower lumbar spine which could reflect arachnoiditis or be related to high-grade spinal stenosis more proximally. Paraspinal and other soft tissues: Similar appearance of bilateral paravertebral phlegmon at L3-4 involving both psoas muscles.  No evidence of drainable fluid collection. Disc levels: Similar appearance of degenerative and postoperative changes, with detailed assessment deferred to the recent prior examination. Motion artifact on axial imaging limits assessment. Epidural enhancement is again noted at L3-4 without a discrete abscess identified. Multifactorial spinal stenosis is moderate at L2-3, severe at L3-4, and moderate to severe at L4-5. There is severe bilateral neural foraminal stenosis at L3-4. IMPRESSION: 1. 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 2. 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 3. No evidence of infection in the cervical or thoracic spine. 4. 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 5. Multilevel cervical neural foraminal stenosis, severe bilaterally at C6-7. 6. 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 Severe spinal stenosis. 7. No evidence of new infection in the lumbar spine. Electronically Signed   By: Sebastian Ache M.D.   On: 04/16/2017 21:10   Mr Lumbar Spine W Wo Contrast  Result Date: 04/16/2017 CLINICAL DATA:  Lethargy. Facial droop. 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 EXAM: MRI HEAD WITHOUT AND WITH CONTRAST MRI CERVICAL SPINE WITHOUT AND WITH CONTRAST MRI THORACIC SPINE WITHOUT AND WITH CONTRAST MRI LUMBAR SPINE WITHOUT AND WITH CONTRAST TECHNIQUE: Multiplanar, multiecho pulse sequences of the brain and surrounding structures, cervical spine, to include the craniocervical junction and cervicothoracic junction, and thoracic and lumbar spine were obtained without and with intravenous contrast. CONTRAST:  15mL MULTIHANCE GADOBENATE DIMEGLUMINE 529 MG/ML IV SOLN COMPARISON:  Head CT 04/16/2017. Brain MRI 07/22/2015. Chest CTA 04/14/2017. Lumbar spine MRI 04/12/2017. Cervical spine CT 07/21/2015. FINDINGS: MRI HEAD FINDINGS  Brain: There is a 4 mm acute or early subacute infarct in the left cerebellum. There is no evidence of acute supratentorial infarct. A chronic microhemorrhage in the left temporal lobe is unchanged from the prior MRI. No mass, midline shift, or extra-axial fluid collection is seen. There is moderate cerebral atrophy. Patchy T2 hyperintensities in the cerebral white matter and pons are similar to the prior MRI and nonspecific but compatible with moderate chronic small vessel ischemic disease. 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 No abnormal enhancement is identified, however postcontrast imaging is motion degraded, severely so on the axial sequence. Vascular: Major intracranial vascular flow voids are preserved. 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 Skull and upper cervical spine: Unremarkable bone marrow signal. Sinuses/Orbits: Left cataract extraction. Moderate left ethmoid air cell mucosal  thickening. Mild left frontal sinus mucosal thickening. Moderate right mastoid effusion. Other: None. MRI CERVICAL SPINE FINDINGS Multiple sequences are moderately motion degraded. Alignment: Cervical spine straightening. At most minimal retrolisthesis of C6 on C7 and minimal anterolisthesis of C7 on T1. Slight left convex curvature of the cervical spine. Vertebrae: No evidence of fracture or air discitis/ osteomyelitis. Multilevel degenerative marrow changes, greatest along the C5 inferior endplate. No epidural fluid collection. Cord: Normal signal and morphology. No abnormal intradural enhancement. Posterior Fossa, vertebral arteries, paraspinal tissues: Grossly preserved vertebral artery flow voids in the upper neck. No paraspinal fluid collection. Disc levels: Moderate to severe disc space narrowing throughout the cervical spine with exception  of C7-T1. C2-3: Mild uncovertebral spurring and mild right facet arthrosis without significant stenosis. C3-4: Uncovertebral spurring and minimal disc bulging without stenosis. C4-5: Broad-based posterior disc osteophyte complex results in mild-to-moderate left neural foraminal stenosis and mild spinal stenosis. C5-6: Broad-based posterior disc osteophyte complex results in moderate bilateral neural foraminal stenosis without significant spinal stenosis. C6-7: Broad-based posterior disc osteophyte complex results in borderline to mild spinal stenosis and severe bilateral neural foraminal stenosis. C7-T1: Moderate right facet arthrosis result in moderate to severe right neural foraminal stenosis. No spinal stenosis. MRI THORACIC SPINE FINDINGS Multiple sequences are moderately motion degraded. Alignment:  Normal. Vertebrae: No evidence of fracture or discitis. No suspicious osseous lesion. No epidural fluid collection. Cord: Normal signal and morphology. No abnormal intradural enhancement. Paraspinal and other soft tissues: Small bilateral pleural effusions. Disc levels: Preserved disc space heights throughout the thoracic spine. No sizable disc herniation identified. Widely patent spinal canal and neural foramina throughout. MRI LUMBAR SPINE FINDINGS The study is mildly to moderately motion degraded. Segmentation:  Standard. Alignment: Unchanged trace retrolisthesis of L2 on L3, grade 1 retrolisthesis of L3 on L4, and grade 1 anterolisthesis of L4 on L5. Vertebrae: Prior L5-S1 PLIF with stable fatty marrow changes. Similar appearance of abnormal fluid signal throughout the L3-4 disc space, which is collapsed, with erosive changes and edema and enhancement about both endplates. Chronic severe disc space height loss at L4-5 without changes suggestive of discitis/ osteomyelitis at this level. No evidence of new lumbar spine infection. Conus medullaris: Extends to the L1-2 level and appears normal. Clumped appearance of  the cauda equina nerve roots in the lower lumbar spine which could reflect arachnoiditis or be related to high-grade spinal stenosis more proximally. Paraspinal and other soft tissues: Similar appearance of bilateral paravertebral phlegmon at L3-4 involving both psoas muscles. No evidence of drainable fluid collection. Disc levels: Similar appearance of degenerative and postoperative changes, with detailed assessment deferred to the recent prior examination. Motion artifact on axial imaging limits assessment. Epidural enhancement is again noted at L3-4 without a discrete abscess identified. Multifactorial spinal stenosis is moderate at L2-3, severe at L3-4, and moderate to severe at L4-5. There is severe bilateral neural foraminal stenosis at L3-4. IMPRESSION: 1. 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 2. 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 3. No evidence of infection in the cervical or thoracic spine. 4. 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 5. Multilevel cervical neural foraminal stenosis, severe bilaterally at C6-7. 6. 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 Severe spinal stenosis. 7. No evidence of new infection in the lumbar spine. Electronically Signed   By: Sebastian Ache M.D.   On: 04/16/2017 21:10   Ct Head Code Stroke Wo Contrast  Result Date: 04/16/2017 CLINICAL DATA:  Code stroke.  Facial droop. EXAM: CT HEAD WITHOUT CONTRAST TECHNIQUE: Contiguous axial images were obtained from the base of the skull through the vertex without intravenous contrast. COMPARISON:  04/12/2017 FINDINGS: Brain: There is no evidence of acute infarct, intracranial hemorrhage, mass, midline shift, or extra-axial fluid collection. There is moderate cerebral and cerebellar atrophy. Patchy to confluent cerebral white matter hypodensities are unchanged  and nonspecific but compatible with moderate chronic small vessel ischemic disease. There are chronic lacunar infarcts in the thalami and basal ganglia. Vascular: Calcified atherosclerosis at the skullbase. No hyperdense vessel. Skull: No fracture or focal osseous lesion. Sinuses/Orbits: Mild-to-moderate left frontal and left ethmoid sinus mucosal thickening. Clear mastoid air cells. Left cataract extraction. Other: None. ASPECTS [facility] Stroke Program Early CT Score) - Ganglionic level infarction (caudate, lentiform nuclei, internal capsule, insula, M1-M3 cortex): 7 - Supraganglionic infarction (M4-M6 cortex): 3 Total score (0-10 with 10 being normal): 10 IMPRESSION: 1. No evidence of acute intracranial abnormality. 2. ASPECTS is 10. 3. 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 These results were communicated to Dr. Amada Jupiter at 3:13 pmon 12/16/2018by text page via the [facility] messaging system. Electronically Signed   By: Sebastian Ache M.D.   On: 04/16/2017 15:14     Scheduled Meds: . amLODipine  10 mg Oral Daily  . cholecalciferol  2,000 Units Oral Daily  . clopidogrel  75 mg Oral Q breakfast  . doxazosin  8 mg Oral QHS  . feeding supplement (ENSURE ENLIVE)  237 mL Oral BID BM  . feeding supplement (PRO-STAT SUGAR FREE 64)  30 mL Oral BID  . finasteride  5 mg Oral Daily  . FLUoxetine  40 mg Oral Daily  . fluticasone  2 spray Each Nare QHS  . folic acid  1 mg Oral Daily  . loratadine  10 mg Oral Daily  . losartan  25 mg Oral Daily  . mouth rinse  15 mL Mouth Rinse BID  . Melatonin  1 tablet Oral QHS  . mirabegron ER  25 mg Oral Daily  . pantoprazole  40 mg Oral Daily  . senna  1 tablet Oral BID  . tiotropium  18 mcg Inhalation Daily   Continuous Infusions: . cefTRIAXone (ROCEPHIN)  IV Stopped (04/18/17 5409)     LOS: 6 days   Time Spent in minutes   30 minutes  Maciej Schweitzer D.O. on 04/18/2017 at 11:46 AM  Between 7am to 7pm - Pager -  [phone]  After 7pm go to [email or web address] - password [email or web address]  And look for the night coverage person covering for me after hours  [facility] Office  [phone]

## 2017-04-18 NOTE — [place] FL2 (Signed)
[place] MEDICAID FL2 LEVEL OF CARE SCREENING TOOL     IDENTIFICATION  Patient Name: Norman Davis Birthdate: 1940-09-22 Sex: male Admission Date (Current Location): 04/12/2017  [facility] [place]   Facility and Address:  [facility] [place], [address], [place], [place] [address]      Provider Number: [id number]  Attending Physician Name and Address:  Edsel PetrinMikhail, Maryann, DO  Relative Name and Phone Number:       Current Level of Care: Hospital Recommended Level of Care: Skilled Nursing Facility Prior Approval Number:    Date Approved/Denied:   PASRR Number:   [id number]   Discharge Plan: SNF    Current Diagnoses: Patient Active Problem List   Diagnosis Date Noted  . 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 Cerebrovascular accident (CVA) (HCC)   . 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  . Tachycardia 04/12/2017  . Spinal stenosis 08/07/2015  . Absence of bladder continence 08/07/2015  . Pulmonary emphysema (HCC) 08/07/2015  . Essential hypertension 08/07/2015  . Depression, major, recurrent, moderate (HCC) 07/30/2015    Class: Chronic  . GERD without esophagitis 07/28/2015  . BPH (benign prostatic hyperplasia) 07/28/2015  . UI (urinary incontinence) 07/28/2015  . Fall 07/22/2015  . HLD (hyperlipidemia) 07/22/2015  . Depression 07/22/2015  . Rhabdomyolysis 07/22/2015  . Hypokalemia 07/22/2015  . Elevated CK   . COPD exacerbation (HCC) 06/12/2015  . Elevated troponin 06/12/2015  . Elevated d-dimer   . Leg pain, left 04/28/2015  . Chest pain 04/28/2015  . NSTEMI (non-ST elevated myocardial infarction) (HCC) 04/28/2015  . Abdominal pain 04/28/2015  . COPD (chronic obstructive pulmonary disease) (HCC) 04/11/2013  . Hypercholesteremia   . Hypertension   . H/O urinary retention   . Myocardial infarction (HCC)   . Gallstones 03/17/2013  . Renal stone 03/17/2013  . SBO (small bowel obstruction) (HCC) 03/16/2013  .  AKI (acute kidney injury) (HCC) 03/16/2013  . High anion gap metabolic acidosis 03/16/2013  . 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) 03/16/2013    Orientation RESPIRATION BLADDER Height & Weight     Self, Place  Normal Incontinent, External catheter Weight: 165 lb 12.6 oz (75.2 kg) Height:  5\' 9"  (175.3 cm)  BEHAVIORAL SYMPTOMS/MOOD NEUROLOGICAL BOWEL NUTRITION STATUS      Incontinent Diet(see DC summary)  AMBULATORY STATUS COMMUNICATION OF NEEDS Skin   Extensive Assist Verbally Normal                       Personal Care Assistance Level of Assistance  Bathing, Dressing Bathing Assistance: Maximum assistance   Dressing Assistance: Maximum assistance     Functional Limitations Info             SPECIAL CARE FACTORS FREQUENCY  PT (By licensed PT), OT (By licensed OT)     PT Frequency: 5/wk OT Frequency: 5/wk            Contractures      Additional Factors Info  Code Status, Allergies, Psychotropic Code Status Info: FULL Allergies Info: Aspirin, Penicillins Psychotropic Info: prozac         Current Medications (04/18/2017):  This is the current hospital active medication list Current Facility-Administered Medications  Medication Dose Route Frequency Provider Last Rate Last Dose  . acetaminophen (TYLENOL) tablet 650 mg  650 mg Oral Q6H PRN Pearson GrippeKim, [name], MD       Or  . acetaminophen (TYLENOL) suppository 650 mg  650 mg Rectal Q6H PRN Pearson GrippeKim, [name], MD  650 mg at 04/16/17 1320  . acetaminophen (TYLENOL) tablet 650 mg  650 mg Oral Q6H PRN Calvert Cantorizwan, Saima, MD   650 mg at 04/18/17 0313  . albuterol (PROVENTIL) (2.5 MG/3ML) 0.083% nebulizer solution 2.5 mg  2.5 mg Nebulization Q4H PRN Berton Mountgbata, Sylvester I, MD      . amLODipine (NORVASC) tablet 10 mg  10 mg Oral Daily Pearson GrippeKim, [name], MD   10 mg at 04/18/17 0841  . cefTRIAXone (ROCEPHIN) 2 g in dextrose 5 % 50 mL IVPB  2 g Intravenous Q12H Darreld McleanPatel, Vishal, MD   Stopped at 04/18/17 [phone]  . Chlorhexidine Gluconate Cloth 2 % PADS 6  each  6 each Topical Daily Mikhail, Nita SellsMaryann, DO      . cholecalciferol (VITAMIN D) tablet 2,000 Units  2,000 Units Oral Daily Pearson GrippeKim, [name], MD   2,000 Units at 04/18/17 [phone]  . clopidogrel (PLAVIX) tablet 75 mg  75 mg Oral Q breakfast Pearson GrippeKim, [name], MD   75 mg at 04/18/17 0843  . doxazosin (CARDURA) tablet 8 mg  8 mg Oral QHS Pearson GrippeKim, [name], MD   8 mg at 04/17/17 2235  . feeding supplement (ENSURE ENLIVE) (ENSURE ENLIVE) liquid 237 mL  237 mL Oral BID BM Berton Mountgbata, Sylvester I, MD   237 mL at 04/18/17 0840  . feeding supplement (PRO-STAT SUGAR FREE 64) liquid 30 mL  30 mL Oral BID Berton Mountgbata, Sylvester I, MD   30 mL at 04/18/17 0841  . finasteride (PROSCAR) tablet 5 mg  5 mg Oral Daily Pearson GrippeKim, [name], MD   5 mg at 04/18/17 [phone]  . FLUoxetine (PROZAC) capsule 40 mg  40 mg Oral Daily Pearson GrippeKim, [name], MD   40 mg at 04/18/17 [phone]  . fluticasone (FLONASE) 50 MCG/ACT nasal spray 2 spray  2 spray Each Nare QHS Pearson GrippeKim, [name], MD   2 spray at 04/17/17 2234  . folic acid (FOLVITE) tablet 1 mg  1 mg Oral Daily Pearson GrippeKim, [name], MD   1 mg at 04/17/17 1246  . hydrALAZINE (APRESOLINE) injection 5 mg  5 mg Intravenous Q6H PRN Pearson GrippeKim, [name], MD      . loratadine (CLARITIN) tablet 10 mg  10 mg Oral Daily Pearson GrippeKim, [name], MD   10 mg at 04/18/17 0840  . losartan (COZAAR) tablet 25 mg  25 mg Oral Daily Pearson GrippeKim, [name], MD   25 mg at 04/18/17 [phone]  . MEDLINE mouth rinse  15 mL Mouth Rinse BID Berton Mountgbata, Sylvester I, MD   15 mL at 04/18/17 0843  . Melatonin TABS 3 mg  1 tablet Oral QHS Pearson GrippeKim, [name], MD   3 mg at 04/15/17 2342  . mirabegron ER (MYRBETRIQ) tablet 25 mg  25 mg Oral Daily Pearson GrippeKim, [name], MD   25 mg at 04/18/17 0840  . pantoprazole (PROTONIX) EC tablet 40 mg  40 mg Oral Daily Pearson GrippeKim, [name], MD   40 mg at 04/18/17 0841  . polyethylene glycol (MIRALAX / GLYCOLAX) packet 17 g  17 g Oral Daily PRN Pearson GrippeKim, [name], MD      . senna [facility](SENOKOT) tablet 8.6 mg  1 tablet Oral BID Pearson GrippeKim, [name], MD   8.6 mg at 04/18/17 0841  . sodium chloride flush (NS) 0.9 % injection 10-40 mL  10-40  mL Intracatheter Q12H Mikhail, OvettMaryann, DO      . sodium chloride flush (NS) 0.9 % injection 10-40 mL  10-40 mL Intracatheter PRN Edsel PetrinMikhail, Maryann, DO      . tiotropium [facility](SPIRIVA) inhalation capsule 18 mcg  18 mcg Inhalation Daily Selena BattenKim,  Fayrene Fearing, MD   18 mcg at 04/18/17 1610     Discharge Medications: Please see discharge summary for a list of discharge medications.  Relevant Imaging Results:  Relevant Lab Results:   Additional Information SS#: [id number]; pt will need IV rocephin q 12 hours until 05/28/16  Burna Sis, LCSW

## 2017-04-18 NOTE — Clinical Social Work Note (Signed)
Clinical Social Work Assessment  Patient Details  Name: Norman Davis MRN: [id number] Date of Birth: Sep 05, 1940  Date of referral:  04/18/17               Reason for consult:  Facility Placement                Permission sought to share information with:  Oceanographeracility Contact Representative Permission granted to share information::  Yes, Verbal Permission Granted, Yes, Release of Information Signed  Name::     Teresa PeltonLillie Matthews, Joneen Roacharmen Charlton  Agency::  APS, SNF  Relationship::  sister, case Facilities managerworker  Contact Information:     Housing/Transportation Living arrangements for the past 2 months:  Single Family Home Source of Information:  Other (Comment Required)(sister) Patient Interpreter Needed:  None Criminal Activity/Legal Involvement Pertinent to Current Situation/Hospitalization:  No - Comment as needed Significant Relationships:  Siblings Lives with:  Siblings Do you feel safe going back to the place where you live?  No Need for family participation in patient care:  Yes (Comment)  Care giving concerns:  Pt lives at home with sister- sister does not feel as if she can manage pt needs at home due to her own health conditions.  Care giving concerns with home environment leading to APS involvement- call into APS worker Fara ChuteCarmen Charleton [phone] to discuss and ensure sister is appropriate contact for this pt.   Social Worker assessment / plan:  CSW requested to call pt sister to discuss SNF placement.  Pt sister does not feel as if she can manage pt IV antibiotics at home due to her bad vision.  Pt sister states pt has been to SNF in the past and had bad experiences but that she has heard good things about [facility].  Employment status:  Retired Health and safety inspectornsurance information:  Medicare PT Recommendations:  Not assessed at this time Information / Referral to community resources:  Skilled Nursing Facility  Patient/Family's Response to care:  Pt sister is agreeable to SNF if pt qualifies and  hopeful for [facility].  Patient/Family's Understanding of and Emotional Response to Diagnosis, Current Treatment, and Prognosis:  Pt sister seems to realistic about pt increased care needs and expresses that she wants the best for him.  Emotional Assessment Appearance:  Appears stated age Attitude/Demeanor/Rapport:  Unable to Assess Affect (typically observed):  Unable to Assess Orientation:  Oriented to Self, Oriented to Place Alcohol / Substance use:  Not Applicable Psych involvement (Current and /or in the community):  No (Comment)  Discharge Needs  Concerns to be addressed:  Care Coordination Readmission within the last 30 days:  No Current discharge risk:  Physical Impairment Barriers to Discharge:  Continued Medical Work up   Burna SisUris, Locklan Canoy H, LCSW 04/18/2017, 1:46 PM

## 2017-04-18 NOTE — Plan of Care (Signed)
Discussed with the patient plan of care for the evening, pain management and temperature regulation with cold packs with some teach back displayed; reinforcement needed.

## 2017-04-18 NOTE — Progress Notes (Signed)
Peripherally Inserted Central Catheter/Midline Placement  The IV Nurse has discussed with the patient and/or persons authorized to consent for the patient, the purpose of this procedure and the potential benefits and risks involved with this procedure.  The benefits include less needle sticks, lab draws from the catheter, and the patient may be discharged home with the catheter. Risks include, but not limited to, infection, bleeding, blood clot (thrombus formation), and puncture of an artery; nerve damage and irregular heartbeat and possibility to perform a PICC exchange if needed/ordered by physician.  Alternatives to this procedure were also discussed.  Bard Power PICC patient education guide, fact sheet on infection prevention and patient information card has been provided to patient /or left at bedside.    PICC/Midline Placement Documentation  PICC Single Lumen 04/18/17 PICC Right Brachial 40 cm 0 cm (Active)  Indication for Insertion or Continuance of Line Prolonged intravenous therapies 04/18/2017 12:00 PM  Exposed Catheter (cm) 0 cm 04/18/2017 12:00 PM  Site Assessment Clean;Dry;Intact 04/18/2017 12:00 PM  Line Status Flushed;Blood return noted 04/18/2017 12:00 PM  Dressing Type Transparent 04/18/2017 12:00 PM  Dressing Status Clean;Dry;Intact 04/18/2017 12:00 PM  Dressing Intervention New dressing 04/18/2017 12:00 PM  Dressing Change Due 04/25/17 04/18/2017 12:00 PM       Reginia FortsLumban, Bernie Ransford Albarece 04/18/2017, 12:43 PM

## 2017-04-18 NOTE — Progress Notes (Signed)
PHARMACY CONSULT NOTE FOR:  OUTPATIENT  PARENTERAL ANTIBIOTIC THERAPY (OPAT)  Indication: Vertebral osteo diskitis Regimen: Rocephin 2g IV every 12 hours End date: 05/28/17  IV antibiotic discharge orders are pended. To discharging provider:  please sign these orders via discharge navigator,  Select New Orders & click on the button choice - Manage This Unsigned Work.     Thank you for allowing pharmacy to be a part of this patient's care.  Norman Davis, Norman Davis Ann 04/18/2017, 12:59 PM

## 2017-04-19 ENCOUNTER — Inpatient Hospital Stay (HOSPITAL_COMMUNITY): Payer: Medicare Other

## 2017-04-19 LAB — BASIC METABOLIC PANEL
ANION GAP: 8 (ref 5–15)
BUN: 26 mg/dL — AB (ref 6–20)
CO2: 27 mmol/L (ref 22–32)
Calcium: 9.4 mg/dL (ref 8.9–10.3)
Chloride: 107 mmol/L (ref 101–111)
Creatinine, Ser: 0.85 mg/dL (ref 0.61–1.24)
GFR calc Af Amer: >60 mL/min (ref >60)
GFR calc non Af Amer: >60 mL/min (ref >60)
GLUCOSE: 151 mg/dL — AB (ref 65–99)
POTASSIUM: 3.2 mmol/L — AB (ref 3.5–5.1)
Sodium: 142 mmol/L (ref 135–145)

## 2017-04-19 MED ORDER — BISACODYL 5 MG PO TBEC: 5.0000 mg | DELAYED_RELEASE_TABLET | Freq: Every day | ORAL | Status: DC | PRN

## 2017-04-19 MED ORDER — POTASSIUM CHLORIDE 10 MEQ/100ML IV SOLN
10.0000 meq | Freq: Once | INTRAVENOUS | Status: AC
  Administered 2017-04-19: 10 meq via INTRAVENOUS
  Filled 2017-04-19: qty 100

## 2017-04-19 MED ORDER — POTASSIUM CHLORIDE 10 MEQ/100ML IV SOLN
10.0000 meq | INTRAVENOUS | Status: AC
  Administered 2017-04-19 (×3): 10 meq via INTRAVENOUS
  Filled 2017-04-19 (×3): qty 100

## 2017-04-19 MED ORDER — BISACODYL 5 MG PO TBEC
5.0000 mg | DELAYED_RELEASE_TABLET | Freq: Once | ORAL | Status: AC
  Administered 2017-04-19: 5 mg via ORAL
  Filled 2017-04-19: qty 1

## 2017-04-19 NOTE — Progress Notes (Signed)
PROGRESS NOTE    Norman Davis  HQI:[id number] DOB: [birth year] DOA: 04/12/2017 PCP: Kirt Boys, DO   Chief Complaint  Patient presents with  . Weakness    Brief Narrative:  HPI on 04/12/2017 by Dr. Pearson Grippe Norman Davis  is a 76 y.o. male, w hx of Schizophrenia, Hypertension, Hyperlipidemia, Pagets, [name], Spinal stenosis, prior back surgery in remote past in [place] and ESI several months ago by Dr. Ollen Bowl presents w c/o inability to walk starting on Monday. He has been afebrile per his sister.  His nurse called EMS today due to his inability to walk. .  Pt was brought to ED for evaluation.   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 Neurosurgery felt this to be chronic.  Acute CVA -Noted on MRI: Punctate small vessel acute or early subacute infarct in the left cerebellum -Neurology consulted and appreciated, infarct may be related to cerebral vasospasm in the setting of inflammation -Continue Plavix  Abdominal distention -will obtain abdominal xray  Lethargy -Neurontin, Remeron, Vicodin discontinued -Continue to monitor closely -Unsure of patient's baseline- discussed with patient's sister via phone, patient is likely at his baseline. He does not really speak. She attributes this to his schizophrenia  Hypokalemia -K 3.2, replaced and  will continue to monitor  Essential hypertension -Continue amlodipine, losartan  BPH -Continue Cardura, Proscar, Mirabegron  Schizophrenia (affect) -Takes paliperidone q3 months -continue prozac -Remeron held due to lethargy  Dysphagia -overnight had difficulty swallowing saliva and needed deep suctioning -speech to reevaluate  Left arm edema -LUE venous dupplex: Acute superficial venous thrombosis in the cephalic vein  DVT Prophylaxis  SCDs  Code Status: Full  Family Communication: None at bedside  Disposition Plan: Admitted. Will need SNF.  Consultants Neurology Neurosurgery Infectious disease Interventional radiology  Procedures  Disc Aspiration under fluoro L3-L4 LUE doppler  Antibiotics   Anti-infectives (From admission, onward)   Start     Dose/Rate Route Frequency Ordered Stop   04/17/17 2000  cefTRIAXone (ROCEPHIN) 2 g in dextrose 5 % 50 mL IVPB     2 g 100 mL/hr over 30 Minutes Intravenous Every 12 hours 04/17/17 1445     04/16/17 2200  vancomycin (VANCOCIN) 1,250 mg in sodium chloride 0.9 % 250 mL IVPB  Status:  Discontinued     1,250 mg 166.7 mL/hr over 90 Minutes Intravenous Every 24 hours 04/16/17 1735 04/17/17 1445   04/13/17 1600  vancomycin (VANCOCIN) 1,250 mg in sodium chloride 0.9 % 250 mL IVPB  Status:  Discontinued     1,250 mg 166.7 mL/hr over 90 Minutes Intravenous Every 24 hours 04/12/17 1957 04/16/17 1735   04/13/17 1000  ceFEPIme (MAXIPIME) 2 g in dextrose 5 % 50 mL IVPB  Status:  Discontinued     2 g 100 mL/hr over 30 Minutes Intravenous Every 12 hours 04/12/17 1957 04/15/17 1646   04/12/17 2030  vancomycin (VANCOCIN) 1,500 mg in sodium chloride 0.9 % 500 mL IVPB     1,500 mg 250 mL/hr over 120 Minutes  Intravenous  Once 04/12/17 1928 04/12/17 2345   04/12/17 2000  ceFEPIme (MAXIPIME) 2 g in dextrose 5 % 50 mL IVPB     2 g 100 mL/hr over 30 Minutes Intravenous  Once 04/12/17 1928 04/12/17 2142      Subjective:   Norman Davis  seen and examined today.  Has no pain today. Denies chest pain, shortness of breath, abdominal pain, N/V/D/C.  Objective:   Vitals:   04/19/17 0600 04/19/17 0724 04/19/17 0734 04/19/17 1142  BP: 120/85 132/86 132/86 (!) 153/98  Pulse: 87 89 96 91  Resp: 11 (!) 9 16 12   Temp:   98.2 F (36.8 C) 98.7 F (37.1 C)  TempSrc:   Oral Oral  SpO2: 97% 99% 99% 99%  Weight:      Height:        Intake/Output Summary (Last 24 hours) at 04/19/2017 1304 Last data filed at 04/19/2017 1144 Gross per 24 hour  Intake 930 ml  Output 1050 ml  Net -120 ml   Filed Weights   04/17/17 0500 04/18/17 0245 04/19/17 0500  Weight: 71 kg (156 lb 8.4 oz) 75.2 kg (165 lb 12.6 oz) 72.5 kg (159 lb 13.3 oz)   Exam  General: Well developed, well nourished, NAD, appears stated age  HEENT: NCAT, mucous membranes moist.   Cardiovascular: S1 S2 auscultated, RRR, no murmurs  Respiratory: Clear to auscultation bilaterally with equal chest rise  Abdomen: Soft, nontender, nondistended, + bowel sounds  Extremities: warm dry without cyanosis clubbing of LE. B/L upper ext edema  Neuro: AAOx2, does not speak much. Follows commands.   Psych: Appropriate  Data Reviewed: I have personally reviewed following labs and imaging studies  CBC: Recent Labs  Lab 04/12/17 1440 04/13/17 2214 04/16/17 0703  WBC 11.0* 14.1* 9.2  NEUTROABS 8.7*  --   --   HGB 14.7 12.6* 11.4*  HCT 43.6 38.4* 35.6*  MCV 87.6 88.5 89.4  PLT 182 157 164   Basic Metabolic Panel: Recent Labs  Lab 04/12/17 1440 04/13/17 2214 04/14/17 1851 04/15/17 0343 04/16/17 1632 04/19/17 0220  NA 142 143  --  141  --  142  K 2.9* 3.8  --  3.9  --  3.2*  CL 105 112*  --  112*  --  107  CO2 27 22  --  21*  --  27  GLUCOSE 95 151*  --  111*  --  151*  BUN 15 17  --  14  --  26*  CREATININE 0.92 1.18  --  0.84 0.94 0.85  CALCIUM 9.4 8.4*  --  8.7*  --  9.4  MG 1.5*  --  1.9  --   --   --   PHOS  --   --   --  2.9  --   --     GFR: Estimated Creatinine Clearance: 71.6 mL/min (by C-G formula based on SCr of 0.85 mg/dL). Liver Function Tests: Recent Labs  Lab 04/13/17 2214 04/15/17 0343  AST 61*  --   ALT 28  --   ALKPHOS 120  --   BILITOT 1.0  --   PROT 6.6  --   ALBUMIN 3.0* 2.4*   No results for input(s): LIPASE, AMYLASE in the last 168 hours. Recent Labs  Lab 04/16/17 1410 04/16/17 1632  AMMONIA 25 20   Coagulation Profile: No results for input(s): INR, PROTIME in the last 168 hours. Cardiac Enzymes: Recent Labs  Lab 04/12/17 2210 04/13/17 2214 04/14/17 0503  04/14/17 0858 04/15/17 0343  CKTOTAL 3,553*  --   --   --  868*  CKMB 4.4  --   --   --   --   TROPONINI  --  0.03* 0.04* 0.13*  --    BNP (last 3 results) No results for input(s): PROBNP in the last 8760 hours. HbA1C: No results for input(s): HGBA1C in the last 72 hours. CBG: Recent Labs  Lab 04/13/17 2124 04/16/17 1303  GLUCAP 108* 102*   Lipid Profile: No results for input(s): CHOL, HDL, LDLCALC, TRIG, CHOLHDL, LDLDIRECT in the last 72 hours. Thyroid Function Tests: No results for input(s): TSH, T4TOTAL, FREET4, T3FREE, THYROIDAB in the last 72 hours. Anemia Panel: No results for input(s): VITAMINB12, FOLATE, FERRITIN, TIBC, IRON, RETICCTPCT in the last 72 hours. Urine analysis:    Component Value Date/Time   COLORURINE YELLOW 04/12/2017 1443   APPEARANCEUR CLEAR 04/12/2017 1443   LABSPEC 1.024 04/12/2017 1443   PHURINE 5.0 04/12/2017 1443   GLUCOSEU NEGATIVE 04/12/2017 1443   HGBUR SMALL (A) 04/12/2017 1443   BILIRUBINUR NEGATIVE 04/12/2017 1443   KETONESUR 20 (A) 04/12/2017 1443   PROTEINUR 30 (A) 04/12/2017 1443   UROBILINOGEN 1.0 03/16/2013 1602   NITRITE NEGATIVE 04/12/2017 1443   LEUKOCYTESUR NEGATIVE 04/12/2017 1443   Sepsis Labs: @LABRCNTIP (procalcitonin:4,lacticidven:4)  ) Recent Results (from the past 240 hour(s))  Culture, blood (Routine X 2) w Reflex to ID Panel     Status: None   Collection  Time: 04/12/17  7:07 PM  Result Value Ref Range Status   Specimen Description BLOOD LEFT ANTECUBITAL  Final   Special Requests   Final    BOTTLES DRAWN AEROBIC AND ANAEROBIC Blood Culture results may not be optimal due to an inadequate volume of blood received in culture bottles   Culture   Final    NO GROWTH 5 DAYS Performed at [facility], [address], [place], [place] [address]    Report Status 04/17/2017 FINAL  Final  Culture, blood (Routine X 2) w Reflex to ID Panel     Status: None   Collection Time: 04/12/17 10:10 PM  Result Value Ref Range Status   Specimen Description BLOOD RIGHT HAND  Final   Special Requests IN PEDIATRIC BOTTLE Blood Culture adequate volume  Final   Culture   Final    NO GROWTH 5 DAYS Performed at [facility], [address] [place], [place] [address]    Report Status 04/17/2017 FINAL  Final  Body fluid culture     Status: None   Collection Time: 04/13/17  4:03 PM  Result Value Ref Range Status   Specimen Description FLUID INTERVERTEBRAL DISC  Final   Special Requests NONE  Final   Gram Stain   Final    FEW WBC PRESENT,BOTH PMN AND MONONUCLEAR NO ORGANISMS SEEN    Culture   Final    FEW STAPHYLOCOCCUS SPECIES (COAGULASE NEGATIVE) NO ANAEROBES ISOLATED CRITICAL RESULT CALLED TO, READ BACK BY AND VERIFIED WITH: L TURNER,RN AT 1113 04/15/17 BY L BENFIELD CONCERNING GROWTH ON CULTURE Performed at [facility], [address] [place], [place] [address]    Report Status 04/16/2017 FINAL  Final   Organism ID, Bacteria STAPHYLOCOCCUS SPECIES (COAGULASE NEGATIVE)  Final      Susceptibility   Staphylococcus species (coagulase negative) - MIC*    CIPROFLOXACIN INTERMEDIATE Intermediate     ERYTHROMYCIN >=8 RESISTANT Resistant     GENTAMICIN <=0.5 SENSITIVE Sensitive  OXACILLIN <=0.25 SENSITIVE Sensitive     TETRACYCLINE <=1 SENSITIVE Sensitive     VANCOMYCIN <=0.5 SENSITIVE Sensitive     TRIMETH/SULFA <=10 SENSITIVE Sensitive      CLINDAMYCIN RESISTANT Resistant     RIFAMPIN <=0.5 SENSITIVE Sensitive     Inducible Clindamycin POSITIVE Resistant     * FEW STAPHYLOCOCCUS SPECIES (COAGULASE NEGATIVE)  MRSA PCR Screening     Status: None   Collection Time: 04/14/17  3:59 AM  Result Value Ref Range Status   MRSA by PCR NEGATIVE NEGATIVE Final    Comment:        The GeneXpert MRSA Assay (FDA approved for NASAL specimens only), is one component of a comprehensive MRSA colonization surveillance program. It is not intended to diagnose MRSA infection nor to guide or monitor treatment for MRSA infections.       Radiology Studies: Dg Abd 1 View  Result Date: 04/19/2017 CLINICAL DATA:  Abdominal distention. EXAM: ABDOMEN - 1 VIEW COMPARISON:  CT abdomen pelvis dated July 22, 2015. FINDINGS: Mild distention of the entire colon with air and stool. No definite small bowel dilatation. No definite pneumoperitoneum. Prior lower lumbar fusion and left iliac wing ORIF. Irregularity at the L3-L4 disc space. IVC filter in place, unchanged. IMPRESSION: 1. Mild distention of the entire colon with air and stool. Correlate for ileus or constipation. No definite small bowel dilatation. 2. Endplate cortical irregularity at L3-L4, consistent with known osteomyelitis discitis. Electronically Signed   By: Obie DredgeWilliam T Derry M.D.   On: 04/19/2017 09:57   Dg Chest Port 1 View  Result Date: 04/19/2017 CLINICAL DATA:  Hypoxia EXAM: PORTABLE CHEST 1 VIEW COMPARISON:  Chest radiograph 04/16/2017 FINDINGS: There is worsened aeration of the left lung with increased left basilar atelectasis. Cardiomediastinal silhouette is unchanged. No pleural effusion or pneumothorax. Unchanged findings of COPD. IMPRESSION: Worsened inflation of the lungs with left basilar atelectasis. Electronically Signed   By: Deatra RobinsonKevin  Herman M.D.   On: 04/19/2017 04:35     Scheduled Meds: . amLODipine  10 mg Oral Daily  . Chlorhexidine Gluconate Cloth  6 each Topical Daily   . cholecalciferol  2,000 Units Oral Daily  . clopidogrel  75 mg Oral Q breakfast  . doxazosin  8 mg Oral QHS  . feeding supplement (ENSURE ENLIVE)  237 mL Oral BID BM  . feeding supplement (PRO-STAT SUGAR FREE 64)  30 mL Oral BID  . finasteride  5 mg Oral Daily  . FLUoxetine  40 mg Oral Daily  . fluticasone  2 spray Each Nare QHS  . folic acid  1 mg Oral Daily  . loratadine  10 mg Oral Daily  . losartan  25 mg Oral Daily  . mouth rinse  15 mL Mouth Rinse BID  . Melatonin  1 tablet Oral QHS  . mirabegron ER  25 mg Oral Daily  . pantoprazole  40 mg Oral Daily  . senna  1 tablet Oral BID  . sodium chloride flush  10-40 mL Intracatheter Q12H  . tiotropium  18 mcg Inhalation Daily   Continuous Infusions: . cefTRIAXone (ROCEPHIN)  IV Stopped (04/19/17 0955)     LOS: 7 days   Time Spent in minutes   30 minutes  Norman Davis D.O. on 04/19/2017 at 1:04 PM  Between 7am to 7pm - Pager - [phone]  After 7pm go to [email or web address] - password [email or web address]  And look for the night coverage person covering for me after hours  [facility] Office  [phone]

## 2017-04-19 NOTE — Progress Notes (Signed)
Paged on-call MD about patient having difficulty swallowing his own saliva and had to deep suction.  Patient said he had a hard time breathing prior to suctioning.  Patient kept NPO and Chest XR taken.  Patient was arousable through the episode and alert to person the whole time.  Will continue to monitor.

## 2017-04-19 NOTE — Care Management Important Message (Signed)
Important Message  Patient Details  Name: Norman Davis MRN: [id number] Date of Birth: 1941-04-25   Medicare Important Message Given:  Yes    Kyla BalzarineShealy, Dawnell Bryant Abena 04/19/2017, 9:11 AM

## 2017-04-19 NOTE — Evaluation (Signed)
Occupational Therapy Evaluation Patient Details Name: Norman Davis MRN: [id number] DOB: 1940-08-21 Today's Date: 04/19/2017    History of Present Illness Norman Davis a76 y.o.male prior back surgery in remote past in [place] and ESI several months ago by Dr. Ollen Bowl presents w c/o inability to walk starting on Monday.MRI revealed left cerebellar infarct. Pt has a past medical history including Cholelithiasis, GERD; Hypercholesteremia, HTN, Nephrolithiasis, Paget's disease of bone, Schizophrenia, Shortness of breath dyspnea.  has a past surgical history that includes Total hip arthroplasty; Pelvic fracture surgery; Liver surgery; Back surgery; and IR LUMBAR DISC ASPIRATION W/IMG GUIDE (04/13/2017).   Clinical Impression   Unsure of Pt's PLOF as Pt gave varied answers during initial interview. Pt is currently max A for LB ADL and min to mod A for UB ADL. BUE edema and decreased ROM/strength. Pt also complaining of blurry vision - but not formally assessed this session. Pt required max +2 assist for bed mobility and heavy max +2 for SPT to recliner with use of bed pad for assist. Pt will require continued skilled OT in the acute setting and afterwards to maximize safety and independence in ADL and functional transfers to work on OT deficits listed below. Next session to focus on vision assessment and fine motor function - grasping objects essential for functional tasks.     Follow Up Recommendations  SNF;Supervision/Assistance - 24 hour    Equipment Recommendations  Other (comment)(defer to next venue of care)    Recommendations for Other Services       Precautions / Restrictions Precautions Precautions: Fall Restrictions Weight Bearing Restrictions: No      Mobility Bed Mobility Overal bed mobility: Needs Assistance Bed Mobility: Supine to Sit     Supine to sit: Max assist;+2 for physical assistance;+2 for safety/equipment;HOB elevated     General bed mobility comments:  Assist for BLE to EOB, assist for trunk elevation, and use of bed pad to bring hips EOB. Pt initiates movement but requires max +2 assist  Transfers Overall transfer level: Needs assistance Equipment used: 2 person hand held assist Transfers: Sit to/from Stand;Stand Pivot Transfers Sit to Stand: Max assist;+2 physical assistance;+2 safety/equipment Stand pivot transfers: Max assist;+2 physical assistance;+2 safety/equipment       General transfer comment: use of bed pad and gait belt to assist with heavy boost to standing and continued use of pad for small pivotal steps to recliner    Balance Overall balance assessment: Needs assistance Sitting-balance support: Bilateral upper extremity supported;Feet supported Sitting balance-Leahy Scale: Poor Sitting balance - Comments: at least mod A to maintain upright posture sitting EOB Postural control: Left lateral lean Standing balance support: No upper extremity supported Standing balance-Leahy Scale: Zero Standing balance comment: dependent on external support from therapists                           ADL either performed or assessed with clinical judgement   ADL Overall ADL's : Needs assistance/impaired Eating/Feeding: Minimal assistance Eating/Feeding Details (indicate cue type and reason): Pt able to bring hands to mouth with increased time and effort, trouble maintaining grasp on untensils Grooming: Wash/dry hands;Wash/dry face;Set up;Sitting Grooming Details (indicate cue type and reason): in recliner, will need assist for grasp on grooming objects for sustained activity  Upper Body Bathing: Moderate assistance   Lower Body Bathing: Maximal assistance   Upper Body Dressing : Moderate assistance   Lower Body Dressing: Total assistance   Toilet Transfer: Maximal assistance;+2 for physical assistance;+2  for safety/equipment;Stand-pivot Toilet Transfer Details (indicate cue type and reason): simulated through recliner  transfer Toileting- Clothing Manipulation and Hygiene: Total assistance       Functional mobility during ADLs: Maximal assistance;+2 for physical assistance;+2 for safety/equipment(SPT only)       Vision Baseline Vision/History: Wears glasses Wears Glasses: Reading only Patient Visual Report: Blurring of vision Vision Assessment?: Vision impaired- to be further tested in functional context Additional Comments: no formal visual assessment this session - will need in future session     Perception     Praxis      Pertinent Vitals/Pain Pain Assessment: Faces Faces Pain Scale: No hurt Pain Intervention(s): Monitored during session     Hand Dominance Right   Extremity/Trunk Assessment Upper Extremity Assessment Upper Extremity Assessment: Generalized weakness;RUE deficits/detail;LUE deficits/detail RUE Deficits / Details: edema noted throughout, grossly 3+/5 strength - will need further assessment RUE Coordination: decreased fine motor;decreased gross motor LUE Deficits / Details: edema noted throughout, grossly 3+/5 strength - will need further assessment LUE Coordination: decreased fine motor;decreased gross motor   Lower Extremity Assessment Lower Extremity Assessment: Defer to PT evaluation   Cervical / Trunk Assessment Cervical / Trunk Assessment: Other exceptions(rounded shoulders, forward head)   Communication Communication Communication: No difficulties   Cognition Arousal/Alertness: Awake/alert;Lethargic Behavior During Therapy: Flat affect Overall Cognitive Status: No family/caregiver present to determine baseline cognitive functioning                                 General Comments: Pt would respond to questions with soft spoken one word answers   General Comments  BUE edema noted, elevated and ranged them to assist with swelling    Exercises     Shoulder Instructions      Home Living Family/patient expects to be discharged to:: Private  residence Living Arrangements: Other relatives(sister) Available Help at Discharge: Family;Available 24 hours/day Type of Home: House Home Access: Stairs to enter Enter[employer] CorporationEntrance Stairs-Number of Steps: 8 Entrance Stairs-Rails: Right Home Layout: Two level;Bed/bath upstairs Alternate Level Stairs-Number of Steps: 16 Alternate Level Stairs-Rails: Right;Left;Can reach both Bathroom Shower/Tub: Chief Strategy OfficerTub/shower unit   Bathroom Toilet: Standard     Home Equipment: Environmental consultantWalker - 2 wheels;Cane - single point;Shower seat   Additional Comments: [facility] to [facility]      Prior Functioning/Environment Level of Independence: Needs assistance                 OT Problem List: Decreased strength;Decreased range of motion;Decreased activity tolerance;Impaired balance (sitting and/or standing);Impaired vision/perception;Decreased coordination;Decreased safety awareness;Decreased knowledge of use of DME or AE;Impaired UE functional use;Increased edema      OT Treatment/Interventions: Self-care/ADL training;Neuromuscular education;DME and/or AE instruction;Therapeutic activities;Visual/perceptual remediation/compensation;Cognitive remediation/compensation;Patient/family education;Balance training    OT Goals(Current goals can be found in the care plan section) Acute Rehab OT Goals Patient Stated Goal: none stated OT Goal Formulation: Patient unable to participate in goal setting ADL Goals Pt Will Perform Grooming: with set-up;sitting Pt Will Perform Upper Body Bathing: sitting;with min guard assist Pt Will Perform Lower Body Bathing: with min assist;sitting/lateral leans Pt Will Transfer to Toilet: with mod assist;stand pivot transfer;bedside commode Pt Will Perform Toileting - Clothing Manipulation and hygiene: with mod assist;sitting/lateral leans Additional ADL Goal #1: Pt will perform bed mobility at min A level prior to engaging in ADL activity  OT Frequency: Min 2X/week   Barriers to  D/C:  Co-evaluation PT/OT/SLP Co-Evaluation/Treatment: Yes Reason for Co-Treatment: Complexity of the patient's impairments (multi-system involvement);Necessary to address cognition/behavior during functional activity;For patient/therapist safety;To address functional/ADL transfers   OT goals addressed during session: ADL's and self-care      AM-PAC PT "6 Clicks" Daily Activity     Outcome Measure Help from another person eating meals?: A Little Help from another person taking care of personal grooming?: A Little Help from another person toileting, which includes using toliet, bedpan, or urinal?: A Lot Help from another person bathing (including washing, rinsing, drying)?: A Lot Help from another person to put on and taking off regular upper body clothing?: A Lot Help from another person to put on and taking off regular lower body clothing?: A Lot 6 Click Score: 14   End of Session Equipment Utilized During Treatment: Gait belt;Oxygen Nurse Communication: Mobility status  Activity Tolerance: Patient tolerated treatment well Patient left: in chair;with call bell/phone within reach;with chair alarm set  OT Visit Diagnosis: Unsteadiness on feet (R26.81);Other abnormalities of gait and mobility (R26.89);Other symptoms and signs involving the nervous system (R29.898);Low vision, both eyes (H54.2)                Time: 1191-47821037-1103 OT Time Calculation (min): 26 min Charges:  OT General Charges $OT Visit: 1 Visit OT Evaluation $OT Eval Moderate Complexity: 1 Mod G-Codes:     Sherryl MangesLaura Drue Camera OTR/L [phone]  Evern BioLaura J Lenix Kidd 04/19/2017, 2:46 PM

## 2017-04-19 NOTE — Progress Notes (Addendum)
  Speech Language Pathology Treatment: Dysphagia  Patient Details Name: Norman Davis MRN: [id number] DOB: 23-Feb-1941 Today's Date: 04/19/2017 Time: 1191-47821504-1513 SLP Time Calculation (min) (ACUTE ONLY): 9 min  Assessment / Plan / Recommendation Clinical Impression  Pt was made NPO after an episode last night during which pt was having difficulty swallowing his saliva, requiring suctioning. RN today reports no similar issues. Pt is more alert and responsive today than during this SLP's last visit, with resultant improvement in timeliness of oral preparation and transit. He has no overt signs of aspiration with Min cues provided by SLP for slower pacing. Recommend restarting Dys 1 diet and thin liquids. Will f/u for tolerance.   HPI HPI: [name] presenting with generalized weakness. CT Head showed chronic atrophy and ischemic changes but no acute findings. MRI lumbar spine shows discitis/osteomyelitis. CXR is suggestive of COPD but wouthout acute consolidation. [name] He had overt coughing with thin liquids during med administration, therefore he was made NPO pending SLP swallowing evaluation. PMH includes schizophrenia, HTN, HLD, pagets, GERD, spinal stenosis, remote back surgery, and falls. Pt reports a h/o dysphagia ~20 years ago following an accident, but denies any difficulty more recently.      SLP Plan  Continue with current plan of care       Recommendations  Diet recommendations: Dysphagia 1 (puree);Thin liquid Liquids provided via: Straw;Cup Medication Administration: Crushed with puree Supervision: Staff to assist with self feeding;Full supervision/cueing for compensatory strategies Compensations: Slow rate;Small sips/bites;Minimize environmental distractions;Follow solids with liquid;Other (Comment)(check for oral holding) Postural Changes and/or Swallow Maneuvers: Seated upright 90 degrees;Upright 30-60 min after meal                 Oral Care Recommendations: Oral care BID Follow up Recommendations: Home health SLP;24 hour supervision/assistance SLP Visit Diagnosis: Dysphagia, oropharyngeal phase (R13.12) Plan: Continue with current plan of care       GO                Norman Davis, Norman Davis 04/19/2017, 3:22 PM  Norman HamLaura Davis, M.A. CCC-SLP [phone]

## 2017-04-19 NOTE — Evaluation (Signed)
Physical Therapy Evaluation Patient Details Name: Norman Davis MRN: [id number] DOB: October 19, 1940 Today's Date: 04/19/2017   History of Present Illness  Norman Davis a76 y.o.male [place] several months ago by Dr. Ollen BowlHarkins presents w c/o inability to walk starting on Monday.MRI revealed left cerebellar infarct. Pt has a past medical history including Cholelithiasis, GERD; Hypercholesteremia, HTN, Nephrolithiasis, Paget's disease of bone, Schizophrenia, Shortness of breath dyspnea.  has a past surgical history that includes Total hip arthroplasty; Pelvic fracture surgery; Liver surgery; Back surgery; and IR LUMBAR DISC ASPIRATION W/IMG GUIDE (04/13/2017).  Clinical Impression  Pt admitted with above diagnosis. Pt currently with functional limitations due to the deficits listed below (see PT Problem List). Pt needed max assist of 2 persons for safe mobility. Wil need SNF for therapy at d/c.  Will follow pt acutely. Pt will benefit from skilled PT to increase their independence and safety with mobility to allow discharge to the venue listed below.      Follow Up Recommendations SNF;Supervision/Assistance - 24 hour    Equipment Recommendations  Other (comment)(TBA)    Recommendations for Other Services       Precautions / Restrictions Precautions Precautions: Fall Restrictions Weight Bearing Restrictions: No      Mobility  Bed Mobility Overal bed mobility: Needs Assistance Bed Mobility: Supine to Sit     Supine to sit: Max assist;+2 for physical assistance;+2 for safety/equipment;HOB elevated     General bed mobility comments: Assist for BLE to EOB, assist for trunk elevation, and use of bed pad to bring hips EOB. Pt initiates movement but requires max +2 assist  Transfers Overall transfer level: Needs assistance Equipment used: 2 person hand held assist Transfers: Sit to/from Stand;Stand Pivot Transfers Sit to Stand: Max  assist;+2 physical assistance;+2 safety/equipment Stand pivot transfers: Max assist;+2 physical assistance;+2 safety/equipment       General transfer comment: use of bed pad and gait belt to assist with heavy boost to standing and continued use of pad for small pivotal steps to recliner  Ambulation/Gait                Stairs            Wheelchair Mobility    Modified Rankin (Stroke Patients Only)       Balance Overall balance assessment: Needs assistance Sitting-balance support: Bilateral upper extremity supported;Feet supported Sitting balance-Leahy Scale: Poor Sitting balance - Comments: at least mod A to maintain upright posture sitting EOB Postural control: Left lateral lean Standing balance support: No upper extremity supported Standing balance-Leahy Scale: Zero Standing balance comment: dependent on external support from therapists                             Pertinent Vitals/Pain Pain Assessment: Faces Faces Pain Scale: No hurt Pain Intervention(s): Monitored during session  VSS  Home Living Family/patient expects to be discharged to:: Private residence Living Arrangements: Other relatives(sister) Available Help at Discharge: Family;Available 24 hours/day Type of Home: House Home Access: Stairs to enter Entrance Stairs-Rails: Right Entrance Stairs-Number of Steps: 8 Home Layout: Two level;Bed/bath upstairs Home Equipment: Walker - 2 wheels;Cane - single point;Shower seat Additional Comments: [facility] to [facility]    Prior Function Level of Independence: Needs assistance   Gait / Transfers Assistance Needed: used strt cane per chart at last admit, pt states he had been using RW.  sister used w/c in community  ADL's / Homemaking Assistance Needed: sister assists with care.         Hand Dominance   Dominant Hand: Right    Extremity/Trunk Assessment   Upper Extremity Assessment Upper Extremity Assessment: Defer to OT  evaluation RUE Deficits / Details: edema noted throughout, grossly 3+/5 strength - will need further assessment RUE Coordination: decreased fine motor;decreased gross motor LUE Deficits / Details: edema noted throughout, grossly 3+/5 strength - will need further assessment LUE Coordination: decreased fine motor;decreased gross motor    Lower Extremity Assessment Lower Extremity Assessment: RLE deficits/detail;LLE deficits/detail RLE Deficits / Details: grossly 3-/5 LLE Deficits / Details: grossly 3/5    Cervical / Trunk Assessment Cervical / Trunk Assessment: Other exceptions(rounded shoulders and forward head)  Communication   Communication: No difficulties  Cognition Arousal/Alertness: Awake/alert;Lethargic Behavior During Therapy: Flat affect Overall Cognitive Status: No family/caregiver present to determine baseline cognitive functioning                                 General Comments: Pt would respond to questions with soft spoken one word answers      General Comments General comments (skin integrity, edema, etc.): BUE swelling, elevated on pillows.    Exercises General Exercises - Lower Extremity Long Arc Quad: AAROM;Both;5 reps;Seated   Assessment/Plan    PT Assessment Patient needs continued PT services  PT Problem List Decreased activity tolerance;Decreased balance;Decreased mobility;Decreased knowledge of use of DME;Decreased safety awareness;Decreased knowledge of precautions;Decreased strength;Decreased range of motion;Cardiopulmonary status limiting activity       PT Treatment Interventions DME instruction;Gait training;Functional mobility training;Therapeutic activities;Therapeutic exercise;Balance training;Patient/family education;Stair training    PT Goals (Current goals can be found in the Care Plan section)  Acute Rehab PT Goals Patient Stated Goal: none stated PT Goal Formulation: With patient Time For Goal Achievement:  05/03/17 Potential to Achieve Goals: Good    Frequency Min 3X/week   Barriers to discharge   unsure of caregiver support    Co-evaluation PT/OT/SLP Co-Evaluation/Treatment: Yes Reason for Co-Treatment: Complexity of the patient's impairments (multi-system involvement) PT goals addressed during session: Mobility/safety with mobility OT goals addressed during session: ADL's and self-care       AM-PAC PT "6 Clicks" Daily Activity  Outcome Measure Difficulty turning over in bed (including adjusting bedclothes, sheets and blankets)?: Unable Difficulty moving from lying on back to sitting on the side of the bed? : Unable Difficulty sitting down on and standing up from a chair with arms (e.g., wheelchair, bedside commode, etc,.)?: Unable Help needed moving to and from a bed to chair (including a wheelchair)?: Total Help needed walking in hospital room?: Total Help needed climbing 3-5 steps with a railing? : Total 6 Click Score: 6    End of Session Equipment Utilized During Treatment: Gait belt;Oxygen Activity Tolerance: Patient limited by fatigue Patient left: in chair;with call bell/phone within reach;with chair alarm set Nurse Communication: Mobility status;Need for lift equipment PT Visit Diagnosis: Unsteadiness on feet (R26.81);Muscle weakness (generalized) (M62.81)    Time: 1027-25361035-1103 PT Time Calculation (min) (ACUTE ONLY): 28 min   Charges:   PT Evaluation $PT Eval Moderate Complexity: 1 Mod     PT G Codes:        Adenike Shidler,PT Acute Rehabilitation [phone] (pager)   Norman Davis 04/19/2017, 2:55 PM

## 2017-04-20 DIAGNOSIS — K3189 Other diseases of stomach and duodenum: Secondary | ICD-10-CM

## 2017-04-20 DIAGNOSIS — R1310 Dysphagia, unspecified: Secondary | ICD-10-CM

## 2017-04-20 DIAGNOSIS — R0902 Hypoxemia: Secondary | ICD-10-CM

## 2017-04-20 LAB — BASIC METABOLIC PANEL
ANION GAP: 6 (ref 5–15)
BUN: 20 mg/dL (ref 6–20)
CHLORIDE: 103 mmol/L (ref 101–111)
CO2: 28 mmol/L (ref 22–32)
Calcium: 9.2 mg/dL (ref 8.9–10.3)
Creatinine, Ser: 0.76 mg/dL (ref 0.61–1.24)
GFR calc Af Amer: >60 mL/min (ref >60)
GFR calc non Af Amer: >60 mL/min (ref >60)
GLUCOSE: 98 mg/dL (ref 65–99)
POTASSIUM: 3.1 mmol/L — AB (ref 3.5–5.1)
Sodium: 137 mmol/L (ref 135–145)

## 2017-04-20 LAB — CBC
HEMATOCRIT: 30.6 % — AB (ref 39.0–52.0)
HEMOGLOBIN: 9.8 g/dL — AB (ref 13.0–17.0)
MCH: 28.5 pg (ref 26.0–34.0)
MCHC: 32 g/dL (ref 30.0–36.0)
MCV: 89 fL (ref 78.0–100.0)
Platelets: 193 10*3/uL (ref 150–400)
RBC: 3.44 MIL/uL — AB (ref 4.22–5.81)
RDW: 14.8 % (ref 11.5–15.5)
WBC: 7.3 10*3/uL (ref 4.0–10.5)

## 2017-04-20 LAB — MAGNESIUM: Magnesium: 1.6 mg/dL — ABNORMAL LOW (ref 1.7–2.4)

## 2017-04-20 MED ORDER — ACETAMINOPHEN 325 MG PO TABS: 650.0000 mg | ORAL_TABLET | Freq: Four times a day (QID) | ORAL | Status: DC | PRN

## 2017-04-20 MED ORDER — MAGNESIUM SULFATE 2 GM/50ML IV SOLN
2.0000 g | Freq: Once | INTRAVENOUS | Status: AC
  Administered 2017-04-20: 2 g via INTRAVENOUS
  Filled 2017-04-20: qty 50

## 2017-04-20 MED ORDER — POTASSIUM CHLORIDE 20 MEQ PO PACK
40.0000 meq | PACK | Freq: Once | ORAL | Status: AC
  Administered 2017-04-20: 40 meq via ORAL
  Filled 2017-04-20: qty 2

## 2017-04-20 MED ORDER — CHLORHEXIDINE GLUCONATE CLOTH 2 % EX PADS: 6.0000 | MEDICATED_PAD | Freq: Every day | CUTANEOUS | Status: DC

## 2017-04-20 MED ORDER — PRO-STAT SUGAR FREE PO LIQD: 30.0000 mL | Freq: Two times a day (BID) | ORAL | 0 refills | Status: DC

## 2017-04-20 MED ORDER — POTASSIUM CHLORIDE 10 MEQ/100ML IV SOLN
10.0000 meq | INTRAVENOUS | Status: AC
  Administered 2017-04-20 (×4): 10 meq via INTRAVENOUS
  Filled 2017-04-20 (×4): qty 100

## 2017-04-20 MED ORDER — ORAL CARE MOUTH RINSE: 15.0000 mL | Freq: Two times a day (BID) | OROMUCOSAL | 0 refills | Status: DC

## 2017-04-20 MED ORDER — CEFTRIAXONE IV (FOR PTA / DISCHARGE USE ONLY): 2.0000 g | Freq: Two times a day (BID) | INTRAVENOUS | 0 refills | Status: AC

## 2017-04-20 MED ORDER — HEPARIN SOD (PORK) LOCK FLUSH 100 UNIT/ML IV SOLN
250.0000 [IU] | INTRAVENOUS | Status: AC | PRN
  Administered 2017-04-20: 250 [IU]

## 2017-04-20 NOTE — Discharge Summary (Signed)
Physician Discharge Summary  Norman Davis NTZ:[id number] DOB: 21-Jun-1940 DOA: 04/12/2017  PCP: Norman Cranker, DO  Admit date: 04/12/2017 Discharge date: 04/20/2017  Time spent: 45 minutes  Recommendations for Outpatient Follow-up:  Patient will be discharged to [facility] skilled nursing facility. Continue physical, occupational and speech therapy.  Patient will need to follow up with primary care provider within one week of discharge, repeat BMP and magnesium levels in one week.  Will need weekly CBC with differential, BMP, CRP, ESR- faxed to [phone] (ID clinic). Patient should continue medications as prescribed.  Patient should follow a dysphagia 1diet.   Discharge Diagnoses:  Lumbar discitis/osteomyelitis/psoas myositis/Inability to ambulate Severe spinal stenosis with bilateral lower extremity weakness Acute CVA Abdominal distention Lethargy Hypokalemia/hypomagnesemia  Essential hypertension BPH Schizophrenia (affect) Dysphagia Left arm edema  Discharge Condition: Stable  Diet recommendation: dysphagia 1  Filed Weights   04/18/17 0245 04/19/17 0500 04/20/17 0127  Weight: 75.2 kg (165 lb 12.6 oz) 72.5 kg (159 lb 13.3 oz) 78.4 kg (172 lb 13.5 oz)    History of present illness:  on 04/12/2017 by Dr. Jani Gravel Davis [phone] y.o.male,w hx of Schizophrenia, Hypertension, Hyperlipidemia, Pagets, Gerd, Spinal stenosis, prior back surgery in remote past in [place] and ESI several months ago by Dr. Maryjean Davis presents w c/o inability to walk starting on Monday. He has been afebrile per his sister. His nurse called EMS today due to his inability to walk. . Pt was brought to ED for evaluation  Hospital Course:  Lumbar discitis/osteomyelitis/psoas myositis/Inability to ambulate -S/p aspiration of L3-L4 interspace on 12/13 by IR -Fluid Culture: few staphylococcus species (coagulase negative) -history of penicillin allergy -Infectious disease consulted and  recommended Ceftraixone BID- through 05/28/2017. Weekly CBC with differential, BMP, CRP, ESR- faxed to [phone]. 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 Neurosurgery felt this to be chronic.  Acute CVA -Noted on MRI: Punctate small vessel acute or early subacute infarct in the left cerebellum -Neurology consulted and appreciated, infarct may be related to cerebral vasospasm in the setting of inflammation -Continue Plavix  Abdominal distention -Abdominal xray: mild distention of the entire colon with air and stool. No small bowel dilatation.  -patient was given dulcolax suppository and abdomen softer and left distended today  Lethargy -Neurontin, Remeron, Vicodin discontinued -Continue to monitor closely -Unsure of patient's baseline- discussed with patient's sister via phone, patient is likely at his baseline. He does not really speak. She attributes this to his schizophrenia  Hypokalemia/hypomagnesemia -Replaced, repeat in BMP and magnesium levels in one week  Essential hypertension -Continue amlodipine, losartan  BPH -Continue Cardura, Proscar, Mirabegron  Schizophrenia (affect) -Takes paliperidone q3 months -continue prozac -Remeron held due to lethargy- patient seems to be at baseline, may continue up on discharge  Dysphagia -speech to reevaluate- placed on dysphagia 1 diet  Left arm edema -LUE venous dupplex: Acute superficial venous thrombosis in the cephalic vein  Consultants Neurology Neurosurgery Infectious disease Interventional radiology  Procedures  Disc Aspiration under fluoro L3-L4 LUE  doppler  Discharge Exam: Vitals:   04/20/17 0732 04/20/17 0813  BP: (!) 141/90   Pulse: 91   Resp: 13   Temp: 99.4 F (37.4 C)   SpO2: 97% 96%   Has no  complaints today. Speaks very little. Denies chest pain, shortness of breath, abdominal pain.    General: Well developed, well nourished, NAD, appears stated age  43: NCAT,mucous membranes moist.  Cardiovascular: S1 S2 auscultated, RRR, no murmurs  Respiratory: Clear to auscultation bilaterally with equal chest rise  Abdomen: Soft, nontender, nondistended, + bowel sounds  Extremities: warm dry without cyanosis clubbing or edema  Neuro: AAOx2, does not speak much, but follows commands. Nonfocal  Psych: Appropriate and pleasant  Discharge Instructions Discharge Instructions    Discharge instructions   Complete by:  As directed    Patient will be discharged to skilled nursing facility. Continue physical, occupational and speech therapy.  Patient will need to follow up with primary care provider within one week of discharge, repeat BMP and magnesium levels in one week.  Will need weekly CBC with differential, BMP, CRP, ESR- faxed to [phone] (ID clinic). Patient should continue medications as prescribed.  Patient should follow a dysphagia 1diet.   Home infusion instructions [facility] May follow [place] Dosing Protocol; May administer Cathflo as needed to maintain patency of vascular access device.; Flushing of vascular access device: per [facility] Protocol: 0.9% NaCl pre/post medica...   Complete by:  As directed    Instructions:  May follow [place] Dosing Protocol   Instructions:  May administer Cathflo as needed to maintain patency of vascular access device.   Instructions:  Flushing of vascular access device: per [facility] Protocol: 0.9% NaCl pre/post medication administration and prn patency; Heparin 100 u/ml, 76m for implanted ports and Heparin 10u/ml, 555mfor all other central venous catheters.   Instructions:   May follow [facility] Anaphylaxis Protocol for First Dose Administration in the home: 0.9% NaCl at 25-50 ml/hr to maintain IV access for protocol meds. Epinephrine 0.3 ml IV/IM PRN and Benadryl 25-50 IV/IM PRN s/s of anaphylaxis.   Instructions:  [facility] Coordinator (RN) to assist per patient IV care needs in the home PRN.     Allergies as of 04/20/2017      Reactions   Aspirin    unknown   Penicillins    Unknown Has patient had a PCN reaction causing immediate rash, facial/tongue/throat swelling, SOB or lightheadedness with hypotension: NO Has patient had a PCN reaction causing severe rash involving mucus membranes or skin necrosis: NO Has patient had a PCN reaction that required hospitalization NO Has patient had a PCN reaction occurring within the last 10 years: NO If all of the above answers are "NO", then may proceed with Cephalosporin use.      Medication List    STOP taking these medications   gabapentin 100 MG capsule Commonly known as:  NEURONTIN   HYDROcodone-acetaminophen 5-325 MG tablet Commonly known as:  NORCO/VICODIN   lidocaine 5 % Commonly known as:  LIDODERM   meloxicam 7.5 MG tablet Commonly known as:  MOBIC   oxyCODONE-acetaminophen 5-325 MG tablet Commonly known as:  PERCOCET/ROXICET     TAKE these medications   acetaminophen 325 MG tablet Commonly known as:  TYLENOL Take 2 tablets (650 mg total) by mouth every 6 (six) hours as needed for mild pain (or Fever >/= 101). What changed:    medication strength  how much to take  reasons to take this   albuterol 108 (90 Base) MCG/ACT inhaler Commonly known as:  PROVENTIL HFA;VENTOLIN HFA Inhale 2 puffs into the lungs every 4 (four) hours as needed for wheezing or shortness of breath.   amLODipine 10 MG tablet  Commonly known as:  NORVASC Take 10 mg by mouth daily.   cefTRIAXone IVPB Commonly known as:  ROCEPHIN Inject 2 g into the vein every 12 (twelve) hours. Indication:  Vertebral  Osteo/Diskitis Last Day of Therapy:  05/28/17 Labs - Once weekly:  CBC/D and BMP, Labs - Every other week:  ESR and CRP   cetirizine 10 MG tablet Commonly known as:  ZYRTEC Take 10 mg by mouth at bedtime.   Chlorhexidine Gluconate Cloth 2 % Pads Apply 6 each topically daily.   clopidogrel 75 MG tablet Commonly known as:  PLAVIX Take 75 mg by mouth daily with breakfast.   doxazosin 8 MG tablet Commonly known as:  CARDURA Take 8 mg by mouth at bedtime.   ENSURE PLUS Liqd Take 237 mLs by mouth 3 (three) times daily between meals.   feeding supplement (PRO-STAT SUGAR FREE 64) Liqd Take 30 mLs by mouth 2 (two) times daily.   finasteride 5 MG tablet Commonly known as:  PROSCAR Take 5 mg by mouth daily.   FLUoxetine 20 MG capsule Commonly known as:  PROZAC Take 40 mg by mouth daily.   fluticasone 50 MCG/ACT nasal spray Commonly known as:  FLONASE Place 2 sprays into both nostrils at bedtime.   folic acid 1 MG tablet Commonly known as:  FOLVITE Take 1 mg by mouth daily.   losartan 50 MG tablet Commonly known as:  COZAAR Take 25 mg by mouth daily.   Melatonin 3 MG Caps Take 1 capsule by mouth at bedtime.   mirtazapine 30 MG tablet Commonly known as:  REMERON Take 30 mg by mouth at bedtime.   mouth rinse Liqd solution 15 mLs by Mouth Rinse route 2 (two) times daily.   MYRBETRIQ PO Take 25 mg by mouth daily.   paliperidone 156 MG/ML Susp injection Commonly known as:  INVEGA SUSTENNA Inject 156 mg into the muscle every 28 (twenty-eight) days.   Paliperidone Palmitate 546 MG/1.75ML Susp Inject 546 mg into the muscle every 3 (three) months.   pantoprazole 40 MG tablet Commonly known as:  PROTONIX Take 40 mg by mouth daily.   polyethylene glycol packet Commonly known as:  MIRALAX / GLYCOLAX Take 17 g by mouth daily as needed for moderate constipation.   pravastatin 40 MG tablet Commonly known as:  PRAVACHOL Take 40 mg by mouth at bedtime. What changed:   Another medication with the same name was removed. Continue taking this medication, and follow the directions you see here.   senna 8.6 MG Tabs tablet Commonly known as:  SENOKOT Take 1 tablet by mouth 2 (two) times daily. What changed:  Another medication with the same name was removed. Continue taking this medication, and follow the directions you see here.   SPIRIVA HANDIHALER 18 MCG inhalation capsule Generic drug:  tiotropium INHALE 1 PUFF VIA HANDIHALER EVERY FOR COPD   Vitamin D 2000 units tablet Take 2,000 Units by mouth daily.            Home Infusion Instuctions  (From admission, onward)        Start     Ordered   04/20/17 0000  Home infusion instructions [facility] May follow [place] Dosing Protocol; May administer Cathflo as needed to maintain patency of vascular access device.; Flushing of vascular access device: per [facility] Protocol: 0.9% NaCl pre/post medica...    Question Answer Comment  Instructions May follow [place] Dosing Protocol   Instructions May administer Cathflo as needed to maintain patency  of vascular access device.   Instructions Flushing of vascular access device: per [facility] Protocol: 0.9% NaCl pre/post medication administration and prn patency; Heparin 100 u/ml, 34m for implanted ports and Heparin 10u/ml, 561mfor all other central venous catheters.   Instructions May follow [facility] Anaphylaxis Protocol for First Dose Administration in the home: 0.9% NaCl at 25-50 ml/hr to maintain IV access for protocol meds. Epinephrine 0.3 ml IV/IM PRN and Benadryl 25-50 IV/IM PRN s/s of anaphylaxis.   Instructions [facility] Infusion Coordinator (RN) to assist per patient IV care needs in the home PRN.      04/20/17 1100     Allergies  Allergen Reactions  . Aspirin     unknown  . Penicillins     Unknown Has patient had a PCN reaction causing immediate rash, facial/tongue/throat swelling, SOB or lightheadedness with hypotension: NO Has patient  had a PCN reaction causing severe rash involving mucus membranes or skin necrosis: NO Has patient had a PCN reaction that required hospitalization NO Has patient had a PCN reaction occurring within the last 10 years: NO If all of the above answers are "NO", then may proceed with Cephalosporin use.    Contact information for follow-up providers    CaGildardo CrankerDO. Schedule an appointment as soon as possible for a visit in 1 week(s).   Specialty:  Internal Medicine Why:  Hospital follow up Contact information: [address] [place] [phone]      [facility]. Schedule an appointment as soon as possible for a visit in 3 week(s).   Specialty:  Infectious Diseases Why:  Hospital followup  Contact information: [id number]         Contact information for after-discharge care    Destination    [facility] Follow up.   Service:  Skilled Nursing Contact information: [phone]                 The results of significant diagnostics from this hospitalization (including imaging, microbiology, ancillary and laboratory) are listed below for reference.    Significant Diagnostic Studies: Dg Ribs Unilateral W/chest Right  Result Date: 04/12/2017 CLINICAL DATA:  Generalized weakness. Fell 2 days ago. RIGHT-sided rib pain. EXAM: RIGHT RIBS AND CHEST - 3+ VIEW COMPARISON:  Chest radiograph 07/29/2015. FINDINGS: No fracture or other bone lesions are seen involving the ribs. There is no evidence of pneumothorax or pleural effusion. 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 Apical bullous change is prominent. No consolidation or edema. Cardiac silhouette normal sized. Tortuosity of the aorta with calcification,  consistent with atherosclerosis. IMPRESSION: Changes of COPD.  No acute consolidation or edema. No rib fractures are identified.  There is no pneumothorax. Electronically Signed   By: JoStaci Righter.D.   On: 04/12/2017 16:00   Dg Lumbar Spine Complete  Result Date: 04/12/2017 CLINICAL DATA:  [age]/o male with generalize weakness and not ambulating for 3 days. Recent fall. Low back pain, bilateral hip pain. Prior surgery. EXAM: LUMBAR SPINE - COMPLETE 4+ VIEW COMPARISON:  Lumbar radiographs 09/03/2015. CT Abdomen and Pelvis 07/22/2015. Lumbar MRI 04/28/2015. FINDINGS: Chronic postoperative changes to the left iliac wing, left SI joint, and L5-S1 spinal levels. Chronic IVC filter. Chronic degenerative appearing sclerosis of the L3 and L4 vertebral bodies,  but associated irregular endplate erosion at N9-G9 is new since the 2017 radiographs (image 5). Other lumbar levels appear stable. Visible lower thoracic levels appears stable. IMPRESSION: 1. Chronic L3 and L4 vertebral body sclerosis with erosion of the endplates since May 2119. While this could be degenerative in nature, consider Discitis Osteomyelitis. Lumbar MRI without and with contrast would best evaluate further. 2. Stable postoperative appearance of L5-S1 and the left hemipelvis. 3. Note that prior lumbar MRI was in 2016, and chronic spinal and pelvic hardware plus IVC filter are unchanged since 2014. Electronically Signed   By: Genevie Ann M.D.   On: 04/12/2017 16:02   Dg Abd 1 View  Result Date: 04/19/2017 CLINICAL DATA:  Abdominal distention. EXAM: ABDOMEN - 1 VIEW COMPARISON:  CT abdomen pelvis dated July 22, 2015. FINDINGS: Mild distention of the entire colon with air and stool. No definite small bowel dilatation. No definite pneumoperitoneum. Prior lower lumbar fusion and left iliac wing ORIF. Irregularity at the L3-L4 disc space. IVC filter in place, unchanged. IMPRESSION: 1. Mild distention of the entire colon with air and stool. Correlate for  ileus or constipation. No definite small bowel dilatation. 2. Endplate cortical irregularity at L3-L4, consistent with known osteomyelitis discitis. Electronically Signed   By: Titus Dubin M.D.   On: 04/19/2017 09:57   Ct Head Wo Contrast  Result Date: 04/12/2017 CLINICAL DATA:  Generalized weakness EXAM: CT HEAD WITHOUT CONTRAST TECHNIQUE: Contiguous axial images were obtained from the base of the skull through the vertex without intravenous contrast. COMPARISON:  07/22/2015 FINDINGS: Brain: Diffuse atrophic and chronic white matter ischemic change is seen. No findings to suggest acute hemorrhage, acute infarction or space-occupying mass lesion are noted. Vascular: No hyperdense vessel or unexpected calcification. Skull: Normal. Negative for fracture or focal lesion. Sinuses/Orbits: No acute finding. Other: None IMPRESSION: Chronic atrophic and ischemic changes. No acute abnormality is noted. Electronically Signed   By: Inez Catalina M.D.   On: 04/12/2017 16:34   Ct Angio Chest Pe W Or Wo Contrast  Result Date: 04/14/2017 CLINICAL DATA:  Elevated D-dimer. EXAM: CT ANGIOGRAPHY CHEST WITH CONTRAST TECHNIQUE: Multidetector CT imaging of the chest was performed using the standard protocol during bolus administration of intravenous contrast. Multiplanar CT image reconstructions and MIPs were obtained to evaluate the vascular anatomy. CONTRAST:  77 mL Isovue 370 intravenous COMPARISON:  04/29/2015 FINDINGS: Cardiovascular: Satisfactory opacification of the pulmonary arteries to the segmental level. No evidence of pulmonary embolism. Normal heart size. No pericardial effusion. Marked aortic tortuosity. No aneurysm or dissection. Mediastinum/Nodes: No enlarged mediastinal, hilar, or axillary lymph nodes. Thyroid gland, trachea, and esophagus demonstrate no significant findings. Lungs/Pleura: Confluent consolidation in the left lower lobe posteriorly. This is new. It may represent pneumonia. Severe bullous  emphysematous disease bilaterally. No pleural effusion. Airways are patent. Upper Abdomen: Air within the bile ducts may relate to prior sphincterotomy. Musculoskeletal: No significant skeletal lesion. Review of the MIP images confirms the above findings. IMPRESSION: 1. Negative for acute pulmonary embolism. 2. Left lower lobe consolidation in the posterior base. This may represent pneumonia superimposed on severe emphysematous disease. 3. Pneumobilia.  Question prior sphincterotomy. Electronically Signed   By: Andreas Newport M.D.   On: 04/14/2017 03:49   Mr Jeri Cos ER Contrast  Result Date: 04/16/2017 CLINICAL DATA:  Lethargy. Facial droop. 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 EXAM: MRI HEAD WITHOUT AND WITH CONTRAST MRI CERVICAL SPINE WITHOUT AND WITH CONTRAST MRI THORACIC SPINE WITHOUT AND WITH CONTRAST MRI LUMBAR SPINE WITHOUT  AND WITH CONTRAST TECHNIQUE: Multiplanar, multiecho pulse sequences of the brain and surrounding structures, cervical spine, to include the craniocervical junction and cervicothoracic junction, and thoracic and lumbar spine were obtained without and with intravenous contrast. CONTRAST:  49m MULTIHANCE GADOBENATE DIMEGLUMINE 529 MG/ML IV SOLN COMPARISON:  Head CT 04/16/2017. Brain MRI 07/22/2015. Chest CTA 04/14/2017. Lumbar spine MRI 04/12/2017. Cervical spine CT 07/21/2015. 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 There is no evidence of acute supratentorial infarct. A chronic microhemorrhage in the left temporal lobe is unchanged from the prior MRI. No mass, midline shift, or extra-axial fluid collection is seen. There is moderate cerebral atrophy. Patchy T2 hyperintensities in the cerebral white matter and pons are similar to the prior MRI and nonspecific but compatible with moderate chronic small vessel ischemic disease. A chronic lacunar white matter infarct in the high posterior right frontal  lobe is unchanged. 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 No abnormal enhancement is identified, however postcontrast imaging is motion degraded, severely so on the axial sequence. Vascular: Major intracranial vascular flow voids are preserved. 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 Skull and upper cervical spine: Unremarkable bone marrow signal. Sinuses/Orbits: Left cataract extraction. Moderate left ethmoid air cell mucosal thickening. Mild left frontal sinus mucosal thickening. Moderate right mastoid effusion. Other: None. MRI CERVICAL SPINE FINDINGS Multiple sequences are moderately motion degraded. Alignment: Cervical spine straightening. At most minimal retrolisthesis of C6 on C7 and minimal anterolisthesis of C7 on T1. Slight left convex curvature of the cervical spine. Vertebrae: No evidence of fracture or air discitis/ osteomyelitis. Multilevel degenerative marrow changes, greatest along the C5 inferior endplate. No epidural fluid collection. Cord: Normal signal and morphology. No abnormal intradural enhancement. Posterior Fossa, vertebral arteries, paraspinal tissues: Grossly preserved vertebral artery flow voids in the upper neck. No paraspinal fluid collection. Disc levels: Moderate to severe disc space narrowing throughout the cervical spine with exception of C7-T1. C2-3: Mild uncovertebral spurring and mild right facet arthrosis without significant stenosis. C3-4: Uncovertebral spurring and minimal disc bulging without stenosis. C4-5: Broad-based posterior disc osteophyte complex results in mild-to-moderate left neural foraminal stenosis and mild spinal stenosis. C5-6: Broad-based posterior disc osteophyte complex results in moderate bilateral neural foraminal stenosis without significant spinal stenosis. C6-7: Broad-based posterior disc osteophyte complex results in borderline to mild spinal stenosis and severe  bilateral neural foraminal stenosis. C7-T1: Moderate right facet arthrosis result in moderate to severe right neural foraminal stenosis. No spinal stenosis. MRI THORACIC SPINE FINDINGS Multiple sequences are moderately motion degraded. Alignment:  Normal. Vertebrae: No evidence of fracture or discitis. No suspicious osseous lesion. No epidural fluid collection. Cord: Normal signal and morphology. No abnormal intradural enhancement. Paraspinal and other soft tissues: Small bilateral pleural effusions. Disc levels: Preserved disc space heights throughout the thoracic spine. No sizable disc herniation identified. Widely patent spinal canal and neural foramina throughout. MRI LUMBAR SPINE FINDINGS The study is mildly to moderately motion degraded. Segmentation:  Standard. Alignment: Unchanged trace retrolisthesis of L2 on L3, grade 1 retrolisthesis of L3 on L4, and grade 1 anterolisthesis of L4 on L5. Vertebrae: Prior L5-S1 PLIF with stable fatty marrow changes. Similar appearance of abnormal fluid signal throughout the L3-4 disc space, which is collapsed, with erosive changes and edema and enhancement about both endplates. Chronic severe disc space height loss at L4-5 without changes suggestive of discitis/ osteomyelitis at this level.  No evidence of new lumbar spine infection. Conus medullaris: Extends to the L1-2 level and appears normal. Clumped appearance of the cauda equina nerve roots in the lower lumbar spine which could reflect arachnoiditis or be related to high-grade spinal stenosis more proximally. Paraspinal and other soft tissues: Similar appearance of bilateral paravertebral phlegmon at L3-4 involving both psoas muscles. No evidence of drainable fluid collection. Disc levels: Similar appearance of degenerative and postoperative changes, with detailed assessment deferred to the recent prior examination. Motion artifact on axial imaging limits assessment. Epidural enhancement is again noted at L3-4 without  a discrete abscess identified. Multifactorial spinal stenosis is moderate at L2-3, severe at L3-4, and moderate to severe at L4-5. There is severe bilateral neural foraminal stenosis at L3-4. IMPRESSION: 1. 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 2. 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 3. No evidence of infection in the cervical or thoracic spine. 4. 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 5. Multilevel cervical neural foraminal stenosis, severe bilaterally at C6-7. 6. 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 Severe spinal stenosis. 7. No evidence of new infection in the lumbar spine. Electronically Signed   By: Logan Bores M.D.   On: 04/16/2017 21:10   Mr Cervical Spine W Wo Contrast  Result Date: 04/16/2017 CLINICAL DATA:  Lethargy. Facial droop. 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 EXAM: MRI HEAD WITHOUT AND WITH CONTRAST MRI CERVICAL SPINE WITHOUT AND WITH CONTRAST MRI THORACIC SPINE WITHOUT AND WITH CONTRAST MRI LUMBAR SPINE WITHOUT AND WITH CONTRAST TECHNIQUE: Multiplanar, multiecho pulse sequences of the brain and surrounding structures, cervical spine, to include the craniocervical junction and cervicothoracic junction, and thoracic and lumbar spine were obtained without and with intravenous contrast. CONTRAST:  25m MULTIHANCE GADOBENATE DIMEGLUMINE 529 MG/ML IV SOLN COMPARISON:  Head CT 04/16/2017. Brain MRI 07/22/2015. Chest CTA 04/14/2017. Lumbar spine MRI 04/12/2017. Cervical spine CT 07/21/2015. 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 There is no evidence of acute supratentorial infarct. A chronic microhemorrhage in the left temporal lobe is unchanged from the prior MRI. No mass, midline shift, or extra-axial fluid collection is seen. There  is moderate cerebral atrophy. Patchy T2 hyperintensities in the cerebral white matter and pons are similar to the prior MRI and nonspecific but compatible with moderate chronic small vessel ischemic disease. 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 No abnormal enhancement is identified, however postcontrast imaging is motion degraded, severely so on the axial sequence. Vascular: Major intracranial vascular flow voids are preserved. 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 Skull and upper cervical spine: Unremarkable bone marrow signal. Sinuses/Orbits: Left cataract extraction. Moderate left ethmoid air cell mucosal thickening. Mild left frontal sinus mucosal thickening. Moderate right mastoid effusion. Other: None. MRI CERVICAL SPINE FINDINGS Multiple sequences are moderately motion degraded. Alignment: Cervical spine straightening. At most minimal retrolisthesis of C6 on C7 and minimal anterolisthesis of C7 on T1. Slight left convex curvature of the cervical spine. Vertebrae: No evidence of fracture or air discitis/ osteomyelitis. Multilevel degenerative marrow changes, greatest along the C5 inferior endplate. No epidural fluid collection. Cord: Normal signal and morphology. No abnormal intradural enhancement. Posterior Fossa, vertebral arteries, paraspinal tissues: Grossly preserved vertebral artery flow voids  in the upper neck. No paraspinal fluid collection. Disc levels: Moderate to severe disc space narrowing throughout the cervical spine with exception of C7-T1. C2-3: Mild uncovertebral spurring and mild right facet arthrosis without significant stenosis. C3-4: Uncovertebral spurring and minimal disc bulging without stenosis. C4-5: Broad-based posterior disc osteophyte complex results in mild-to-moderate left neural foraminal stenosis  and mild spinal stenosis. C5-6: Broad-based posterior disc osteophyte complex results in moderate bilateral neural foraminal stenosis without significant spinal stenosis. C6-7: Broad-based posterior disc osteophyte complex results in borderline to mild spinal stenosis and severe bilateral neural foraminal stenosis. C7-T1: Moderate right facet arthrosis result in moderate to severe right neural foraminal stenosis. No spinal stenosis. MRI THORACIC SPINE FINDINGS Multiple sequences are moderately motion degraded. Alignment:  Normal. Vertebrae: No evidence of fracture or discitis. No suspicious osseous lesion. No epidural fluid collection. Cord: Normal signal and morphology. No abnormal intradural enhancement. Paraspinal and other soft tissues: Small bilateral pleural effusions. Disc levels: Preserved disc space heights throughout the thoracic spine. No sizable disc herniation identified. Widely patent spinal canal and neural foramina throughout. MRI LUMBAR SPINE FINDINGS The study is mildly to moderately motion degraded. Segmentation:  Standard. Alignment: Unchanged trace retrolisthesis of L2 on L3, grade 1 retrolisthesis of L3 on L4, and grade 1 anterolisthesis of L4 on L5. Vertebrae: Prior L5-S1 PLIF with stable fatty marrow changes. Similar appearance of abnormal fluid signal throughout the L3-4 disc space, which is collapsed, with erosive changes and edema and enhancement about both endplates. Chronic severe disc space height loss at L4-5 without changes suggestive of discitis/ osteomyelitis at this level. No evidence of new lumbar spine infection. Conus medullaris: Extends to the L1-2 level and appears normal. Clumped appearance of the cauda equina nerve roots in the lower lumbar spine which could reflect arachnoiditis or be related to high-grade spinal stenosis more proximally. Paraspinal and other soft tissues: Similar appearance of bilateral paravertebral phlegmon at L3-4 involving both psoas muscles. No  evidence of drainable fluid collection. Disc levels: Similar appearance of degenerative and postoperative changes, with detailed assessment deferred to the recent prior examination. Motion artifact on axial imaging limits assessment. Epidural enhancement is again noted at L3-4 without a discrete abscess identified. Multifactorial spinal stenosis is moderate at L2-3, severe at L3-4, and moderate to severe at L4-5. There is severe bilateral neural foraminal stenosis at L3-4. IMPRESSION: 1. 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 2. 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 3. No evidence of infection in the cervical or thoracic spine. 4. 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 5. Multilevel cervical neural foraminal stenosis, severe bilaterally at C6-7. 6. 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 Severe spinal stenosis. 7. No evidence of new infection in the lumbar spine. Electronically Signed   By: Logan Bores M.D.   On: 04/16/2017 21:10   Mr Thoracic Spine W Wo Contrast  Result Date: 04/16/2017 CLINICAL DATA:  Lethargy. Facial droop. 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 EXAM: MRI HEAD WITHOUT AND WITH CONTRAST MRI CERVICAL SPINE WITHOUT AND WITH CONTRAST MRI THORACIC SPINE WITHOUT AND WITH CONTRAST MRI LUMBAR SPINE WITHOUT AND WITH CONTRAST TECHNIQUE: Multiplanar, multiecho pulse sequences of the brain and surrounding structures, cervical spine, to include the craniocervical junction and cervicothoracic junction, and thoracic and lumbar spine were obtained without and with intravenous contrast. CONTRAST:  17m MULTIHANCE GADOBENATE DIMEGLUMINE 529 MG/ML IV SOLN COMPARISON:  Head CT 04/16/2017. Brain MRI 07/22/2015.  Chest CTA 04/14/2017. Lumbar spine MRI 04/12/2017. Cervical spine CT 07/21/2015. FINDINGS: MRI HEAD FINDINGS  Brain: There is a 4 mm acute or early subacute infarct in the left cerebellum. There is no evidence of acute supratentorial infarct. A chronic microhemorrhage in the left temporal lobe is unchanged from the prior MRI. No mass, midline shift, or extra-axial fluid collection is seen. There is moderate cerebral atrophy. Patchy T2 hyperintensities in the cerebral white matter and pons are similar to the prior MRI and nonspecific but compatible with moderate chronic small vessel ischemic disease. 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 No abnormal enhancement is identified, however postcontrast imaging is motion degraded, severely so on the axial sequence. Vascular: Major intracranial vascular flow voids are preserved. 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 Skull and upper cervical spine: Unremarkable bone marrow signal. Sinuses/Orbits: Left cataract extraction. Moderate left ethmoid air cell mucosal thickening. Mild left frontal sinus mucosal thickening. Moderate right mastoid effusion. Other: None. MRI CERVICAL SPINE FINDINGS Multiple sequences are moderately motion degraded. Alignment: Cervical spine straightening. At most minimal retrolisthesis of C6 on C7 and minimal anterolisthesis of C7 on T1. Slight left convex curvature of the cervical spine. Vertebrae: No evidence of fracture or air discitis/ osteomyelitis. Multilevel degenerative marrow changes, greatest along the C5 inferior endplate. No epidural fluid collection. Cord: Normal signal and morphology. No abnormal intradural enhancement. Posterior Fossa, vertebral arteries, paraspinal tissues: Grossly preserved vertebral artery flow voids in the upper neck. No paraspinal fluid collection. Disc levels: Moderate to severe disc space narrowing throughout the cervical spine with exception  of C7-T1. C2-3: Mild uncovertebral spurring and mild right facet arthrosis without significant stenosis. C3-4: Uncovertebral spurring and minimal disc bulging without stenosis. C4-5: Broad-based posterior disc osteophyte complex results in mild-to-moderate left neural foraminal stenosis and mild spinal stenosis. C5-6: Broad-based posterior disc osteophyte complex results in moderate bilateral neural foraminal stenosis without significant spinal stenosis. C6-7: Broad-based posterior disc osteophyte complex results in borderline to mild spinal stenosis and severe bilateral neural foraminal stenosis. C7-T1: Moderate right facet arthrosis result in moderate to severe right neural foraminal stenosis. No spinal stenosis. MRI THORACIC SPINE FINDINGS Multiple sequences are moderately motion degraded. Alignment:  Normal. Vertebrae: No evidence of fracture or discitis. No suspicious osseous lesion. No epidural fluid collection. Cord: Normal signal and morphology. No abnormal intradural enhancement. Paraspinal and other soft tissues: Small bilateral pleural effusions. Disc levels: Preserved disc space heights throughout the thoracic spine. No sizable disc herniation identified. Widely patent spinal canal and neural foramina throughout. MRI LUMBAR SPINE FINDINGS The study is mildly to moderately motion degraded. Segmentation:  Standard. Alignment: Unchanged trace retrolisthesis of L2 on L3, grade 1 retrolisthesis of L3 on L4, and grade 1 anterolisthesis of L4 on L5. Vertebrae: Prior L5-S1 PLIF with stable fatty marrow changes. Similar appearance of abnormal fluid signal throughout the L3-4 disc space, which is collapsed, with erosive changes and edema and enhancement about both endplates. Chronic severe disc space height loss at L4-5 without changes suggestive of discitis/ osteomyelitis at this level. No evidence of new lumbar spine infection. Conus medullaris: Extends to the L1-2 level and appears normal. Clumped appearance of  the cauda equina nerve roots in the lower lumbar spine which could reflect arachnoiditis or be related to high-grade spinal stenosis more proximally. Paraspinal and other soft tissues: Similar appearance of bilateral paravertebral  phlegmon at L3-4 involving both psoas muscles. No evidence of drainable fluid collection. Disc levels: Similar appearance of degenerative and postoperative changes, with detailed assessment deferred to the recent prior examination. Motion artifact on axial imaging limits assessment. Epidural enhancement is again noted at L3-4 without a discrete abscess identified. Multifactorial spinal stenosis is moderate at L2-3, severe at L3-4, and moderate to severe at L4-5. There is severe bilateral neural foraminal stenosis at L3-4. IMPRESSION: 1. 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 2. 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 3. No evidence of infection in the cervical or thoracic spine. 4. 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 5. Multilevel cervical neural foraminal stenosis, severe bilaterally at C6-7. 6. 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 Severe spinal stenosis. 7. No evidence of new infection in the lumbar spine. Electronically Signed   By: Logan Bores M.D.   On: 04/16/2017 21:10   Mr Lumbar Spine W Wo Contrast  Result Date: 04/16/2017 CLINICAL DATA:  Lethargy. Facial droop. 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 EXAM: MRI HEAD WITHOUT AND WITH CONTRAST MRI CERVICAL SPINE WITHOUT AND WITH CONTRAST MRI THORACIC SPINE WITHOUT AND WITH CONTRAST MRI LUMBAR SPINE WITHOUT AND WITH CONTRAST TECHNIQUE: Multiplanar, multiecho pulse sequences of the brain and surrounding structures, cervical spine, to include the craniocervical junction and cervicothoracic junction, and thoracic and lumbar spine were  obtained without and with intravenous contrast. CONTRAST:  102m MULTIHANCE GADOBENATE DIMEGLUMINE 529 MG/ML IV SOLN COMPARISON:  Head CT 04/16/2017. Brain MRI 07/22/2015. Chest CTA 04/14/2017. Lumbar spine MRI 04/12/2017. Cervical spine CT 07/21/2015. 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 There is no evidence of acute supratentorial infarct. A chronic microhemorrhage in the left temporal lobe is unchanged from the prior MRI. No mass, midline shift, or extra-axial fluid collection is seen. There is moderate cerebral atrophy. Patchy T2 hyperintensities in the cerebral white matter and pons are similar to the prior MRI and nonspecific but compatible with moderate chronic small vessel ischemic disease. 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 No abnormal enhancement is identified, however postcontrast imaging is motion degraded, severely so on the axial sequence. Vascular: Major intracranial vascular flow voids are preserved. 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 Skull and upper cervical spine: Unremarkable bone marrow signal. Sinuses/Orbits: Left cataract extraction. Moderate left ethmoid air cell mucosal thickening. Mild left frontal sinus mucosal thickening. Moderate right mastoid effusion. Other: None. MRI CERVICAL SPINE FINDINGS Multiple sequences are moderately motion degraded. Alignment: Cervical spine straightening. At most minimal retrolisthesis of C6 on C7 and minimal anterolisthesis of C7 on T1. Slight left convex curvature of the cervical spine. Vertebrae: No evidence of fracture or air discitis/ osteomyelitis. Multilevel degenerative marrow changes, greatest along the C5 inferior endplate. No epidural fluid collection. Cord: Normal signal and morphology. No  abnormal intradural enhancement. Posterior Fossa, vertebral arteries, paraspinal tissues: Grossly preserved vertebral artery flow voids in the upper neck. No paraspinal fluid collection. Disc levels: Moderate to severe disc space narrowing throughout the cervical spine with exception of C7-T1. C2-3: Mild uncovertebral spurring and mild right facet arthrosis without significant stenosis. C3-4: Uncovertebral spurring and minimal disc bulging without stenosis. C4-5: Broad-based posterior disc osteophyte complex results in  mild-to-moderate left neural foraminal stenosis and mild spinal stenosis. C5-6: Broad-based posterior disc osteophyte complex results in moderate bilateral neural foraminal stenosis without significant spinal stenosis. C6-7: Broad-based posterior disc osteophyte complex results in borderline to mild spinal stenosis and severe bilateral neural foraminal stenosis. C7-T1: Moderate right facet arthrosis result in moderate to severe right neural foraminal stenosis. No spinal stenosis. MRI THORACIC SPINE FINDINGS Multiple sequences are moderately motion degraded. Alignment:  Normal. Vertebrae: No evidence of fracture or discitis. No suspicious osseous lesion. No epidural fluid collection. Cord: Normal signal and morphology. No abnormal intradural enhancement. Paraspinal and other soft tissues: Small bilateral pleural effusions. Disc levels: Preserved disc space heights throughout the thoracic spine. No sizable disc herniation identified. Widely patent spinal canal and neural foramina throughout. MRI LUMBAR SPINE FINDINGS The study is mildly to moderately motion degraded. Segmentation:  Standard. Alignment: Unchanged trace retrolisthesis of L2 on L3, grade 1 retrolisthesis of L3 on L4, and grade 1 anterolisthesis of L4 on L5. Vertebrae: Prior L5-S1 PLIF with stable fatty marrow changes. Similar appearance of abnormal fluid signal throughout the L3-4 disc space, which is collapsed, with erosive changes and  edema and enhancement about both endplates. Chronic severe disc space height loss at L4-5 without changes suggestive of discitis/ osteomyelitis at this level. No evidence of new lumbar spine infection. Conus medullaris: Extends to the L1-2 level and appears normal. Clumped appearance of the cauda equina nerve roots in the lower lumbar spine which could reflect arachnoiditis or be related to high-grade spinal stenosis more proximally. Paraspinal and other soft tissues: Similar appearance of bilateral paravertebral phlegmon at L3-4 involving both psoas muscles. No evidence of drainable fluid collection. Disc levels: Similar appearance of degenerative and postoperative changes, with detailed assessment deferred to the recent prior examination. Motion artifact on axial imaging limits assessment. Epidural enhancement is again noted at L3-4 without a discrete abscess identified. Multifactorial spinal stenosis is moderate at L2-3, severe at L3-4, and moderate to severe at L4-5. There is severe bilateral neural foraminal stenosis at L3-4. IMPRESSION: 1. 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 2. 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 3. No evidence of infection in the cervical or thoracic spine. 4. 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 5. Multilevel cervical neural foraminal stenosis, severe bilaterally at C6-7. 6. 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 Severe spinal stenosis. 7. No evidence of new infection in the lumbar spine. Electronically Signed   By: Logan Bores M.D.   On: 04/16/2017 21:10   Mr Lumbar Spine W Wo Contrast  Result Date: 04/12/2017 CLINICAL DATA:  Suspected osteomyelitis. Previous lumbar surgery. Back pain. Unable to bear weight. EXAM: MRI LUMBAR SPINE WITHOUT AND WITH CONTRAST TECHNIQUE: Multiplanar and multiecho pulse  sequences of the lumbar spine were obtained without and with intravenous contrast. CONTRAST:  57m MULTIHANCE GADOBENATE DIMEGLUMINE 529 MG/ML IV SOLN COMPARISON:  Plain films earlier today. MRI lumbar spine 04/28/2015. FINDINGS: Segmentation:  Standard. Alignment: Degenerative anterolisthesis L4 on L5, 5 mm, stable from 2016. Vertebrae: Fatty replaced L5 and S1 vertebral bodies, appears similar to priors. Susceptibility artifact due to pedicle screws at L5 and S1 limits further assessment. Bone marrow edema of an acute nature at L3 and L4, related to L3-4 discal hyperintensity. Abnormal enhancement is observed on postcontrast images. L3 and L4 osteomyelitis is likely, given the plain film appearance. Conus medullaris and cauda equina: Conus extends to the L1-L2 level.  Conus appears normal. Clumping of lower lumbar nerve roots could represent arachnoiditis. No intradural enhancement. Paraspinal and other soft tissues: Paravertebral soft tissue phlegmon extends into both psoas muscles. Disc levels: L1-L2:  Disc desiccation.  Facet arthropathy.  No impingement. L2-L3: Loss of disc height and signal. Shallow central protrusion. Schmorl's nodes. Posterior element hypertrophy. Mild stenosis. No definite impingement. L3-L4: Loss of interspace height, with abnormal T2 hyperintensity centrally accompanied by postcontrast enhancement, consistent with L3-L4 diskitis. Posterior protrusion of disc material. Facet hypertrophy and ligamentum flavum overgrowth contribute to severe stenosis with BILATERAL L3 and L4 nerve root impingement. Ventral and dorsal dural enhancement, and enhancement of the ligamentum flava, but no discrete epidural abscess. L4-L5: 5 mm anterolisthesis. Chronic loss of disc height. Uncovering of the disc without protrusion. Posterior element hypertrophy. Moderate to severe stenosis. BILATERAL L4 and L5 nerve root impingement. L5-S1: Apparent solid interbody and posterior arthrodesis. Adequate posterior  decompression. No definite impingement. 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 Paravertebral phlegmon extends to both psoas muscles. No discrete epidural abscess. Central protrusion in conjunction with posterior element hypertrophy results in severe stenosis affecting the L3 and L4 nerve roots. Stable 5 mm anterolisthesis at L4-5 with chronic loss of disc height. Posterior element hypertrophy contributes to moderate to severe stenosis with BILATERAL L4 and L5 nerve root impingement. Electronically Signed   By: Staci Righter M.D.   On: 04/12/2017 18:44   Dg Chest Port 1 View  Result Date: 04/19/2017 CLINICAL DATA:  Hypoxia EXAM: PORTABLE CHEST 1 VIEW COMPARISON:  Chest radiograph 04/16/2017 FINDINGS: There is worsened aeration of the left lung with increased left basilar atelectasis. Cardiomediastinal silhouette is unchanged. No pleural effusion or pneumothorax. Unchanged findings of COPD. IMPRESSION: Worsened inflation of the lungs with left basilar atelectasis. Electronically Signed   By: Ulyses Jarred M.D.   On: 04/19/2017 04:35   Dg Chest Port 1 View  Result Date: 04/16/2017 CLINICAL DATA:  Rhonchi. EXAM: PORTABLE CHEST 1 VIEW COMPARISON:  04/15/2017 FINDINGS: The cardiomediastinal silhouette is unchanged. Aortic atherosclerosis is noted. Emphysema is again noted with asymmetric bullous changes in the right lung apex. Chronic interstitial coarsening is unchanged. There is no evidence of acute airspace consolidation, edema, pleural effusion, or pneumothorax. IMPRESSION: COPD without evidence of acute abnormality. Electronically Signed   By: Logan Bores M.D.   On: 04/16/2017 13:16   Dg Chest Port 1 View  Result Date: 04/15/2017 CLINICAL DATA:  Edema. EXAM: PORTABLE CHEST 1 VIEW COMPARISON:  04/12/2017 FINDINGS: The heart size is stable. Stable tortuosity of the thoracic aorta. Stable advanced emphysematous lung disease. No acute  edema, airspace consolidation or pleural fluid identified. No pneumothorax. IMPRESSION: Stable advanced emphysematous lung disease. Electronically Signed   By: Aletta Edouard M.D.   On: 04/15/2017 11:57   Dg Swallowing Func-speech Pathology  Result Date: 04/14/2017 Objective Swallowing Evaluation: Type of Study: MBS-Modified Barium Swallow Study  Patient Details Name: Jaydence Arnesen MRN: [id number] Date of Birth: 1940-06-24 Today's Date: 04/14/2017 Time: SLP Start Time (ACUTE ONLY): 1028 -SLP Stop Time (ACUTE ONLY): 1050 SLP Time Calculation (min) (ACUTE ONLY): 22 min Past Medical History: Past Medical History: Diagnosis Date . Cholelithiasis  . Fall 07/22/2015 . GERD (gastroesophageal reflux disease)  . H/O urinary retention  . Hypercholesteremia  . Hypertension  . Nephrolithiasis   right . 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 Schizophrenia ([place])  . Shortness of breath dyspnea  . 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 BACK SURGERY   . IR LUMBAR [place] W/IMG GUIDE  04/13/2017 . LIVER SURGERY   . PELVIC FRACTURE SURGERY   . TOTAL HIP ARTHROPLASTY   HPI: [name] presenting with generalized weakness. CT Head showed chronic atrophy and ischemic changes but no acute findings. MRI lumbar spine shows discitis/osteomyelitis. CXR is suggestive of COPD but wouthout acute consolidation. Pt was transferred to [facility]surgical consult. He had overt coughing with thin liquids during med administration, therefore he was made NPO pending SLP swallowing evaluation. PMH includes schizophrenia, HTN, HLD, pagets, GERD, spinal stenosis, remote back surgery, and falls. Pt reports a h/o dysphagia ~20 years ago following an accident, but denies any difficulty more recently.  Subjective: pt alert, slow speech, slow responses Assessment / Plan / Recommendation CHL IP CLINICAL IMPRESSIONS 04/14/2017 Clinical  Impression Pt has a moderate oral and mild pharyngeal dysphagia with no aspiration observed. He has oral holding and delayed oral transit. Pt often had piecemeal swallowing but would need cues from SLP to swallow the remaining bolus from his oral cavity. Despite a delay in swallowing, most prolonged with purees/solids, he had only trace and transient penetration with large sips of thin liquids. Recommend initiation of Dys 1 diet and thin liquids with full supervision to help with feeding and cueing for oral clearance. SLP will follow and progress as able. SLP Visit Diagnosis Dysphagia, oropharyngeal phase (R13.12) Attention and concentration deficit following -- Frontal lobe and executive function deficit following -- Impact on safety and function Mild aspiration risk   CHL IP TREATMENT RECOMMENDATION 04/14/2017 Treatment Recommendations Therapy as outlined in treatment plan below   Prognosis 04/14/2017 Prognosis for Safe Diet Advancement Good Barriers to Reach Goals Cognitive deficits Barriers/Prognosis Comment -- CHL IP DIET RECOMMENDATION 04/14/2017 SLP Diet Recommendations Dysphagia 1 (Puree) solids;Thin liquid Liquid Administration via Cup;Straw Medication Administration Crushed with puree Compensations Slow rate;Small sips/bites;Minimize environmental distractions;Follow solids with liquid;Other (Comment) Postural Changes Seated upright at 90 degrees   CHL IP OTHER RECOMMENDATIONS 04/14/2017 Recommended Consults -- Oral Care Recommendations Oral care BID Other Recommendations Have oral suction available   CHL IP FOLLOW UP RECOMMENDATIONS 04/14/2017 Follow up Recommendations (No Data)   CHL IP FREQUENCY AND DURATION 04/14/2017 Speech Therapy Frequency (ACUTE ONLY) min 2x/week Treatment Duration 2 weeks      CHL IP ORAL PHASE 04/14/2017 Oral Phase Impaired Oral - Pudding Teaspoon -- Oral - Pudding Cup -- Oral - Honey Teaspoon -- Oral - Honey Cup -- Oral - Nectar Teaspoon -- Oral - Nectar Cup -- Oral - Nectar  Straw -- Oral - Thin Teaspoon -- Oral - Thin Cup Holding of bolus;Delayed oral transit Oral - Thin Straw Holding of bolus;Delayed oral transit Oral - Puree Holding of bolus;Delayed oral transit;Piecemeal swallowing Oral - Mech Soft Holding of bolus;Delayed oral transit;Piecemeal swallowing;Impaired mastication Oral - Regular -- Oral - Multi-Consistency -- Oral - Pill -- Oral Phase - Comment --  CHL IP PHARYNGEAL PHASE 04/14/2017 Pharyngeal Phase Impaired Pharyngeal- Pudding Teaspoon -- Pharyngeal -- Pharyngeal- Pudding Cup -- Pharyngeal -- Pharyngeal- Honey Teaspoon -- Pharyngeal -- Pharyngeal- Honey Cup -- Pharyngeal -- Pharyngeal- Nectar Teaspoon -- Pharyngeal -- Pharyngeal- Nectar Cup -- Pharyngeal -- Pharyngeal- Nectar Straw -- Pharyngeal -- Pharyngeal- Thin Teaspoon -- Pharyngeal -- Pharyngeal- Thin Cup Delayed swallow initiation-vallecula Pharyngeal -- Pharyngeal- Thin Straw Delayed swallow initiation-vallecula;Penetration/Aspiration during swallow Pharyngeal Material enters airway, remains ABOVE vocal cords then ejected  out Pharyngeal- Puree Delayed swallow initiation-vallecula Pharyngeal -- Pharyngeal- Mechanical Soft Delayed swallow initiation-vallecula Pharyngeal -- Pharyngeal- Regular -- Pharyngeal -- Pharyngeal- Multi-consistency -- Pharyngeal -- Pharyngeal- Pill -- Pharyngeal -- Pharyngeal Comment --  CHL IP CERVICAL ESOPHAGEAL PHASE 04/14/2017 Cervical Esophageal Phase WFL Pudding Teaspoon -- Pudding Cup -- Honey Teaspoon -- Honey Cup -- Nectar Teaspoon -- Nectar Cup -- Nectar Straw -- Thin Teaspoon -- Thin Cup -- Thin Straw -- Puree -- Mechanical Soft -- Regular -- Multi-consistency -- Pill -- Cervical Esophageal Comment -- No flowsheet data found. Germain Osgood 04/14/2017, 11:14 AM  Germain Osgood, M.A. CCC-SLP [phone]             Dg Hips Bilat W Or Wo Pelvis 3-4 Views  Result Date: 04/12/2017 CLINICAL DATA:  Generalized weakness. EXAM: DG HIP (WITH OR WITHOUT PELVIS) 3-4V BILAT  COMPARISON:  Abdominal CT 07/22/2015 FINDINGS: Chronic trabecular coarsening and bony expansion affecting the right more than left pelvis and proximal right femur, pagetoid changes. There is extensive fixation hardware to the left iliac wing without acute superimposed finding. No visible fracture as permitted by osteopenia. Lumbosacral posterior decompression and fusion. Severe L4-5 disc degeneration IMPRESSION: 1. No acute finding. 2. 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 3. Postoperative left ilium and lumbosacral junction. 4. Severe L4-5 disc degeneration. Electronically Signed   By: Monte Fantasia M.D.   On: 04/12/2017 16:00   Ct Head Code Stroke Wo Contrast  Result Date: 04/16/2017 CLINICAL DATA:  Code stroke.  Facial droop. EXAM: CT HEAD WITHOUT CONTRAST TECHNIQUE: Contiguous axial images were obtained from the base of the skull through the vertex without intravenous contrast. COMPARISON:  04/12/2017 FINDINGS: Brain: There is no evidence of acute infarct, intracranial hemorrhage, mass, midline shift, or extra-axial fluid collection. There is moderate cerebral and cerebellar atrophy. Patchy to confluent cerebral white matter hypodensities are unchanged and nonspecific but compatible with moderate chronic small vessel ischemic disease. There are chronic lacunar infarcts in the thalami and basal ganglia. Vascular: Calcified atherosclerosis at the skullbase. No hyperdense vessel. Skull: No fracture or focal osseous lesion. Sinuses/Orbits: Mild-to-moderate left frontal and left ethmoid sinus mucosal thickening. Clear mastoid air cells. Left cataract extraction. Other: None. ASPECTS [facility] Stroke Program Early CT Score) - Ganglionic level infarction (caudate, lentiform nuclei, internal capsule, insula, M1-M3 cortex): 7 - Supraganglionic infarction (M4-M6 cortex): 3 Total score (0-10 with 10 being normal): 10 IMPRESSION: 1. No evidence of acute intracranial abnormality. 2. ASPECTS  is 10. 3. 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 These results were communicated to Dr. Leonel Ramsay at 3:13 pmon 12/16/2018by text page via the [facility] messaging system. Electronically Signed   By: Logan Bores M.D.   On: 04/16/2017 15:14   Ir Lumbar Disc Aspiration W/img Guide  Result Date: 04/13/2017 INDICATION: Severe low back pain.  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 EXAM: LUMBAR DISC ASPIRATION UNDER FLUOROSCOPY MEDICATIONS: Lidocaine 1% subcutaneous ANESTHESIA/SEDATION: Intravenous Fentanyl and Versed were administered as conscious sedation during continuous monitoring of the patient's level of consciousness and physiological / cardiorespiratory status by the radiology RN, with a total moderate sedation time of 10 minutes. PROCEDURE: Informed written consent was obtained from the patient after a thorough discussion of the procedural risks, benefits and alternatives. All questions were addressed. Maximal Sterile Barrier Technique was utilized including caps, mask, sterile gowns, sterile gloves, sterile drape, hand hygiene and skin antiseptic. A timeout was performed prior to the initiation of the procedure. The appropriate L3-4 interspace was identified  under fluoroscopy, corresponding to previous cross-sectional imaging. An appropriate skin entry site was determined. After local infiltration with 1% lidocaine, a 17 gauge trocar needle was advanced into the interspace from left posterolateral extraforaminal approach. Needle tip position within the interspace confirmed on biplane images. 2.5 mL thin blood-tinged fluid Were aspirated, sent for the requested laboratory studies. The patient tolerated the procedure well. FLUOROSCOPY TIME:  78 seconds, 52 mGy COMPLICATIONS: None immediate. IMPRESSION: Technically successful lumbar L3-4 disc aspiration under fluoroscopy. Electronically Signed   By: Lucrezia Europe M.D.   On: 04/13/2017 16:32    Microbiology: Recent Results  (from the past 240 hour(s))  Culture, blood (Routine X 2) w Reflex to ID Panel     Status: None   Collection Time: 04/12/17  7:07 PM  Result Value Ref Range Status   Specimen Description BLOOD LEFT ANTECUBITAL  Final   Special Requests   Final    BOTTLES DRAWN AEROBIC AND ANAEROBIC Blood Culture results may not be optimal due to an inadequate volume of blood received in culture bottles   Culture   Final    NO GROWTH 5 DAYS Performed at [facility] [address] [place], [place] [address]    Report Status 04/17/2017 FINAL  Final  Culture, blood (Routine X 2) w Reflex to ID Panel     Status: None   Collection Time: 04/12/17 10:10 PM  Result Value Ref Range Status   Specimen Description BLOOD RIGHT HAND  Final   Special Requests IN PEDIATRIC BOTTLE Blood Culture adequate volume  Final   Culture   Final    NO GROWTH 5 DAYS Performed at [facility], [place] [address] [place], [place] [address]    Report Status 04/17/2017 FINAL  Final  Body fluid culture     Status: None   Collection Time: 04/13/17  4:03 PM  Result Value Ref Range Status   Specimen Description FLUID INTERVERTEBRAL DISC  Final   Special Requests NONE  Final   Gram Stain   Final    FEW WBC PRESENT,BOTH PMN AND MONONUCLEAR NO ORGANISMS SEEN    Culture   Final    FEW STAPHYLOCOCCUS SPECIES (COAGULASE NEGATIVE) NO ANAEROBES ISOLATED CRITICAL RESULT CALLED TO, READ BACK BY AND VERIFIED WITH: L TURNER,RN AT 1113 04/15/17 BY L BENFIELD CONCERNING GROWTH ON CULTURE Performed at [facility], [place] [address], [place], [place] [address]    Report Status 04/16/2017 FINAL  Final   Organism ID, Bacteria STAPHYLOCOCCUS SPECIES (COAGULASE NEGATIVE)  Final      Susceptibility   Staphylococcus species (coagulase negative) - MIC*    CIPROFLOXACIN INTERMEDIATE Intermediate     ERYTHROMYCIN >=8 RESISTANT Resistant     GENTAMICIN <=0.5 SENSITIVE Sensitive     OXACILLIN <=0.25 SENSITIVE Sensitive     TETRACYCLINE  <=1 SENSITIVE Sensitive     VANCOMYCIN <=0.5 SENSITIVE Sensitive     TRIMETH/SULFA <=10 SENSITIVE Sensitive     CLINDAMYCIN RESISTANT Resistant     RIFAMPIN <=0.5 SENSITIVE Sensitive     Inducible Clindamycin POSITIVE Resistant     * FEW STAPHYLOCOCCUS SPECIES (COAGULASE NEGATIVE)  MRSA PCR Screening     Status: None   Collection Time: 04/14/17  3:59 AM  Result Value Ref Range Status   MRSA by PCR NEGATIVE NEGATIVE Final    Comment:        The GeneXpert MRSA Assay (FDA approved for NASAL specimens only), is one component of a comprehensive MRSA colonization surveillance  program. It is not intended to diagnose MRSA infection nor to guide or monitor treatment for MRSA infections.      Labs: Basic Metabolic Panel: Recent Labs  Lab 04/13/17 2214 04/14/17 1851 04/15/17 0343 04/16/17 1632 04/19/17 0220 04/20/17 0517  NA 143  --  141  --  142 137  K 3.8  --  3.9  --  3.2* 3.1*  CL 112*  --  112*  --  107 103  CO2 22  --  21*  --  27 28  GLUCOSE 151*  --  111*  --  151* 98  BUN 17  --  14  --  26* 20  CREATININE 1.18  --  0.84 0.94 0.85 0.76  CALCIUM 8.4*  --  8.7*  --  9.4 9.2  MG  --  1.9  --   --   --  1.6*  PHOS  --   --  2.9  --   --   --    Liver Function Tests: Recent Labs  Lab 04/13/17 2214 04/15/17 0343  AST 61*  --   ALT 28  --   ALKPHOS 120  --   BILITOT 1.0  --   PROT 6.6  --   ALBUMIN 3.0* 2.4*   No results for input(s): LIPASE, AMYLASE in the last 168 hours. Recent Labs  Lab 04/16/17 1410 04/16/17 1632  AMMONIA 25 20   CBC: Recent Labs  Lab 04/13/17 2214 04/16/17 0703 04/20/17 0517  WBC 14.1* 9.2 7.3  HGB 12.6* 11.4* 9.8*  HCT 38.4* 35.6* 30.6*  MCV 88.5 89.4 89.0  PLT 157 164 193   Cardiac Enzymes: Recent Labs  Lab 04/13/17 2214 04/14/17 0503 04/14/17 0858 04/15/17 0343  CKTOTAL  --   --   --  868*  TROPONINI 0.03* 0.04* 0.13*  --    BNP: BNP (last 3 results) Recent Labs    04/15/17 1036  BNP 109.1*    ProBNP (last 3  results) No results for input(s): PROBNP in the last 8760 hours.  CBG: Recent Labs  Lab 04/13/17 2124 04/16/17 1303  GLUCAP 108* 102*       Signed:  Donte Lenzo  [facility] 04/20/2017, 11:01 AM

## 2017-04-20 NOTE — Progress Notes (Signed)
Physical Therapy Treatment Patient Details Name: Norman Davis MRN: [id number] DOB: 07-12-1940 Today's Date: 04/20/2017    History of Present Illness [name] a76 y.o.male prior back [place] in [place] and [place] several months ago by Norman Davis presents w c/o inability to walk starting on Monday.MRI revealed left cerebellar infarct. Pt has a past medical history including Cholelithiasis, GERD; Hypercholesteremia, HTN, Nephrolithiasis, Paget's disease of bone, Schizophrenia, Shortness of breath dyspnea.  has a past surgical history that includes Total hip arthroplasty; Pelvic fracture surgery; Liver surgery; Back surgery; and IR LUMBAR DISC ASPIRATION W/IMG GUIDE (04/13/2017).    PT Comments    Pt progressing slowly. Weakness bil, but L side notably worse.  Incoordination on the L worse than right.   Emphasis on bed mobility, transitions and standing with intent on attaining upright posture.   Follow Up Recommendations  SNF;Supervision/Assistance - 24 hour     Equipment Recommendations  Other (comment)(TBA next venue)    Recommendations for Other Services       Precautions / Restrictions Precautions Precautions: Fall    Mobility  Bed Mobility Overal bed mobility: Needs Assistance Bed Mobility: Rolling;Sidelying to Sit;Sit to Sidelying Rolling: Mod assist;Max assist;+2 for safety/equipment Sidelying to sit: Max assist;+2 for physical assistance     Sit to sidelying: Max assist;+2 for physical assistance General bed mobility comments: cues for sequencing and guiding left side.  assist  helping trunk up to midline and stability while pt tries to assist  Transfers Overall transfer level: Needs assistance Equipment used: Ambulation equipment used(chairback) Transfers: Sit to/from Stand Sit to Stand: Max assist;+2 physical assistance;+2 safety/equipment         General transfer comment: used bed pad to assist pt with boost, but also pt needed assist  to come forward.  pt also needed significant truncal and knee support for upright posture.  Ambulation/Gait             General Gait Details: not able   Stairs            Wheelchair Mobility    Modified Rankin (Stroke Patients Only) Modified Rankin (Stroke Patients Only) Pre-Morbid Rankin Score: No significant disability Modified Rankin: Severe disability     Balance Overall balance assessment: Needs assistance Sitting-balance support: Bilateral upper extremity supported;Feet supported Sitting balance-Leahy Scale: Poor Sitting balance - Comments: can maintain sitting short periods with UE assist     Standing balance-Leahy Scale: Zero Standing balance comment: dependent on external support from therapists                            Cognition Arousal/Alertness: Awake/alert Behavior During Therapy: Flat affect Overall Cognitive Status: No family/caregiver present to determine baseline cognitive functioning                                        Exercises General Exercises - Lower Extremity Hip Flexion/Marching: AAROM;AROM;Both;10 reps;Supine(graded resistance gross extension)    General Comments General comments (skin integrity, edema, etc.): VSS      Pertinent Vitals/Pain Pain Assessment: Faces Faces Pain Scale: Hurts even more Pain Location: back Pain Descriptors / Indicators: Aching;Grimacing;Guarding Pain Intervention(s): Monitored during session;Repositioned;Limited activity within patient's tolerance    Home Living                      Prior Function  PT Goals (current goals can now be found in the care plan section) Acute Rehab PT Goals Patient Stated Goal: none stated PT Goal Formulation: With patient Time For Goal Achievement: 05/03/17 Potential to Achieve Goals: Good Progress towards PT goals: Progressing toward goals    Frequency    Min 3X/week      PT Plan Current plan remains  appropriate    Co-evaluation              AM-PAC PT "6 Clicks" Daily Activity  Outcome Measure  Difficulty turning over in bed (including adjusting bedclothes, sheets and blankets)?: Unable Difficulty moving from lying on back to sitting on the side of the bed? : Unable Difficulty sitting down on and standing up from a chair with arms (e.g., wheelchair, bedside commode, etc,.)?: Unable Help needed moving to and from a bed to chair (including a wheelchair)?: A Lot Help needed walking in hospital room?: Total Help needed climbing 3-5 steps with a railing? : Total 6 Click Score: 7    End of Session   Activity Tolerance: Patient limited by fatigue Patient left: in bed;with call bell/phone within reach;Other (comment)(with EMS/transport) Nurse Communication: Mobility status;Need for lift equipment PT Visit Diagnosis: Unsteadiness on feet (R26.81);Other abnormalities of gait and mobility (R26.89);Hemiplegia and hemiparesis Hemiplegia - Right/Left: Left Hemiplegia - dominant/non-dominant: Non-dominant Hemiplegia - caused by: Cerebral infarction     Time: 1351-1420 PT Time Calculation (min) (ACUTE ONLY): 29 min  Charges:  $Therapeutic Activity: 23-37 mins                    G Codes:       04/20/2017  [place] BingKen Brittney Davis, PT [phone]  (pager)   Norman GumKenneth V Marlan Davis 04/20/2017, 4:15 PM

## 2017-04-20 NOTE — Clinical Social Work Placement (Signed)
   CLINICAL SOCIAL WORK PLACEMENT  NOTE  Date:  04/20/2017  Patient Details  Name: Norman Davis MRN: [id number] Date of Birth: 03/04/1941  Clinical Social Work is seeking post-discharge placement for this patient at the Skilled  Nursing Facility level of care (*CSW will initial, date and re-position this form in  chart as items are completed):  Yes   Patient/family provided with [place] Clinical Social Work Department's list of facilities offering this level of care within the geographic area requested by the patient (or if unable, by the patient's family).  Yes   Patient/family informed of their freedom to choose among providers that offer the needed level of care, that participate in Medicare, Medicaid or managed care program needed by the patient, have an available bed and are willing to accept the patient.  Yes   Patient/family informed of 's ownership interest in [facility] and [facility], as well as of the fact that they are under no obligation to receive care at these facilities.  PASRR submitted to EDS on       PASRR number received on       Existing PASRR number confirmed on 04/18/17     FL2 transmitted to all facilities in geographic area requested by pt/family on 04/18/17     FL2 transmitted to all facilities within larger geographic area on       Patient informed that his/her managed care company has contracts with or will negotiate with certain facilities, including the following:        Yes   Patient/family informed of bed offers received.  [facility]     Physician recommends and patient chooses bed at      [facility].  Patient to be transferred to facility by ptar     Patient family notified on 04/20/17 of transfer.  Name of family member notified:  Jonna ClarkLillie     PHYSICIAN Please sign FL2     Additional Comment:     _______________________________________________ Burna SisUris, Meir Elwood H, LCSW 04/20/2017, 2:07 PM

## 2017-04-20 NOTE — Plan of Care (Signed)
No acute events at this time. Pt weaned to room air and tolerating well.

## 2017-04-20 NOTE — Care Management Note (Signed)
Case Management Note  Patient Details  Name: Norman Davis MRN: [id number] Date of Birth: [birth year]  Subjective/Objective:   Discharge to SNF today.                 Action/Plan: [facility].  Expected Discharge Date:  04/20/17               Expected Discharge Plan:  Skilled Nursing Facility  In-House Referral:     Discharge planning Services  CM Consult  Post Acute Care Choice:    Choice offered to:     DME Arranged:    DME Agency:     HH Arranged:    HH Agency:     Status of Service:  Completed, signed off  If discussed at [employer] Meetings, dates discussed:    Additional Comments:  Leone Havenaylor, Vertie Dibbern Clinton, RN 04/20/2017, 11:44 AM

## 2017-04-20 NOTE — Progress Notes (Signed)
[facility] will [facility] Anticipated discharge date: 12/20 Family notified: pt sister Transportation by [employer]- scheduled at 2pm  CSW signing off.  Burna SisJenna H. Magdala Brahmbhatt, LCSW Clinical Social Worker [phone]

## 2017-05-18 ENCOUNTER — Telehealth: Payer: Self-pay | Admitting: *Deleted

## 2017-05-18 NOTE — Telephone Encounter (Signed)
Patient recently discharged from [facility].  Now being [facility].  Home health needing to know weekly lab draw information.  RN shared lab information from the hospital discharge summary and gave [facility] pharmacy fax number.

## 2017-05-23 ENCOUNTER — Encounter (HOSPITAL_COMMUNITY): Payer: Self-pay

## 2017-05-23 ENCOUNTER — Inpatient Hospital Stay (HOSPITAL_COMMUNITY)
Admission: EM | Admit: 2017-05-23 | Discharge: 2017-05-25 | DRG: 093 | Disposition: A | Discharge status: Home Health | Payer: Medicare Other | Attending: Internal Medicine | Admitting: Internal Medicine

## 2017-05-23 ENCOUNTER — Emergency Department (HOSPITAL_COMMUNITY): Payer: Medicare Other

## 2017-05-23 ENCOUNTER — Observation Stay (HOSPITAL_COMMUNITY): Payer: Medicare Other

## 2017-05-23 ENCOUNTER — Other Ambulatory Visit: Payer: Self-pay

## 2017-05-23 ENCOUNTER — Inpatient Hospital Stay: Payer: Medicare Other | Admitting: Infectious Disease

## 2017-05-23 DIAGNOSIS — N400 Enlarged prostate without lower urinary tract symptoms: Secondary | ICD-10-CM | POA: Diagnosis present

## 2017-05-23 DIAGNOSIS — R51 Headache: Secondary | ICD-10-CM

## 2017-05-23 DIAGNOSIS — R519 Headache, unspecified: Secondary | ICD-10-CM

## 2017-05-23 DIAGNOSIS — I252 Old myocardial infarction: Secondary | ICD-10-CM

## 2017-05-23 DIAGNOSIS — R29703 NIHSS score 3: Secondary | ICD-10-CM | POA: Diagnosis present

## 2017-05-23 DIAGNOSIS — B957 Other staphylococcus as the cause of diseases classified elsewhere: Secondary | ICD-10-CM | POA: Diagnosis present

## 2017-05-23 DIAGNOSIS — R479 Unspecified speech disturbances: Secondary | ICD-10-CM | POA: Diagnosis not present

## 2017-05-23 DIAGNOSIS — E7800 Pure hypercholesterolemia, unspecified: Secondary | ICD-10-CM | POA: Diagnosis present

## 2017-05-23 DIAGNOSIS — D638 Anemia in other chronic diseases classified elsewhere: Secondary | ICD-10-CM | POA: Diagnosis present

## 2017-05-23 DIAGNOSIS — E876 Hypokalemia: Secondary | ICD-10-CM | POA: Diagnosis present

## 2017-05-23 DIAGNOSIS — G92 Toxic encephalopathy: Secondary | ICD-10-CM | POA: Diagnosis not present

## 2017-05-23 DIAGNOSIS — E8809 Other disorders of plasma-protein metabolism, not elsewhere classified: Secondary | ICD-10-CM | POA: Diagnosis present

## 2017-05-23 DIAGNOSIS — Z8673 Personal history of transient ischemic attack (TIA), and cerebral infarction without residual deficits: Secondary | ICD-10-CM

## 2017-05-23 DIAGNOSIS — R4182 Altered mental status, unspecified: Secondary | ICD-10-CM

## 2017-05-23 DIAGNOSIS — R4781 Slurred speech: Secondary | ICD-10-CM

## 2017-05-23 DIAGNOSIS — F329 Major depressive disorder, single episode, unspecified: Secondary | ICD-10-CM | POA: Diagnosis present

## 2017-05-23 DIAGNOSIS — K5900 Constipation, unspecified: Secondary | ICD-10-CM | POA: Diagnosis present

## 2017-05-23 DIAGNOSIS — R1030 Lower abdominal pain, unspecified: Secondary | ICD-10-CM

## 2017-05-23 DIAGNOSIS — R2990 Unspecified symptoms and signs involving the nervous system: Secondary | ICD-10-CM

## 2017-05-23 DIAGNOSIS — D649 Anemia, unspecified: Secondary | ICD-10-CM

## 2017-05-23 DIAGNOSIS — F209 Schizophrenia, unspecified: Secondary | ICD-10-CM | POA: Diagnosis present

## 2017-05-23 DIAGNOSIS — M889 Osteitis deformans of unspecified bone: Secondary | ICD-10-CM | POA: Diagnosis present

## 2017-05-23 DIAGNOSIS — I639 Cerebral infarction, unspecified: Secondary | ICD-10-CM | POA: Diagnosis present

## 2017-05-23 DIAGNOSIS — M4646 Discitis, unspecified, lumbar region: Secondary | ICD-10-CM | POA: Diagnosis present

## 2017-05-23 DIAGNOSIS — R29810 Facial weakness: Secondary | ICD-10-CM

## 2017-05-23 DIAGNOSIS — F0390 Unspecified dementia without behavioral disturbance: Secondary | ICD-10-CM | POA: Diagnosis present

## 2017-05-23 DIAGNOSIS — Z886 Allergy status to analgesic agent status: Secondary | ICD-10-CM

## 2017-05-23 DIAGNOSIS — Z7951 Long term (current) use of inhaled steroids: Secondary | ICD-10-CM

## 2017-05-23 DIAGNOSIS — K219 Gastro-esophageal reflux disease without esophagitis: Secondary | ICD-10-CM | POA: Diagnosis present

## 2017-05-23 DIAGNOSIS — J449 Chronic obstructive pulmonary disease, unspecified: Secondary | ICD-10-CM | POA: Diagnosis present

## 2017-05-23 DIAGNOSIS — Z7902 Long term (current) use of antithrombotics/antiplatelets: Secondary | ICD-10-CM

## 2017-05-23 DIAGNOSIS — G459 Transient cerebral ischemic attack, unspecified: Secondary | ICD-10-CM | POA: Diagnosis present

## 2017-05-23 DIAGNOSIS — Z880 Allergy status to penicillin: Secondary | ICD-10-CM

## 2017-05-23 DIAGNOSIS — Z79899 Other long term (current) drug therapy: Secondary | ICD-10-CM

## 2017-05-23 DIAGNOSIS — E785 Hyperlipidemia, unspecified: Secondary | ICD-10-CM | POA: Diagnosis present

## 2017-05-23 DIAGNOSIS — I10 Essential (primary) hypertension: Secondary | ICD-10-CM | POA: Diagnosis present

## 2017-05-23 DIAGNOSIS — Z87891 Personal history of nicotine dependence: Secondary | ICD-10-CM

## 2017-05-23 LAB — COMPREHENSIVE METABOLIC PANEL
ALK PHOS: 127 U/L — AB (ref 38–126)
ALT: 10 U/L — ABNORMAL LOW (ref 17–63)
ANION GAP: 11 (ref 5–15)
AST: 17 U/L (ref 15–41)
Albumin: 2.9 g/dL — ABNORMAL LOW (ref 3.5–5.0)
BILIRUBIN TOTAL: 0.7 mg/dL (ref 0.3–1.2)
BUN: 5 mg/dL — AB (ref 6–20)
CALCIUM: 9.7 mg/dL (ref 8.9–10.3)
CO2: 25 mmol/L (ref 22–32)
Chloride: 105 mmol/L (ref 101–111)
Creatinine, Ser: 0.82 mg/dL (ref 0.61–1.24)
GFR calc Af Amer: >60 mL/min (ref >60)
GLUCOSE: 93 mg/dL (ref 65–99)
Potassium: 3.2 mmol/L — ABNORMAL LOW (ref 3.5–5.1)
Sodium: 141 mmol/L (ref 135–145)
TOTAL PROTEIN: 6.5 g/dL (ref 6.5–8.1)

## 2017-05-23 LAB — CBC
HEMATOCRIT: 34.5 % — AB (ref 39.0–52.0)
HEMOGLOBIN: 11.1 g/dL — AB (ref 13.0–17.0)
MCH: 28.6 pg (ref 26.0–34.0)
MCHC: 32.2 g/dL (ref 30.0–36.0)
MCV: 88.9 fL (ref 78.0–100.0)
Platelets: 199 10*3/uL (ref 150–400)
RBC: 3.88 MIL/uL — AB (ref 4.22–5.81)
RDW: 15.4 % (ref 11.5–15.5)
WBC: 5.4 10*3/uL (ref 4.0–10.5)

## 2017-05-23 LAB — RAPID URINE DRUG SCREEN, HOSP PERFORMED
Amphetamines: NOT DETECTED
BARBITURATES: NOT DETECTED
Benzodiazepines: NOT DETECTED
COCAINE: NOT DETECTED
Opiates: NOT DETECTED
TETRAHYDROCANNABINOL: NOT DETECTED

## 2017-05-23 LAB — DIFFERENTIAL
Basophils Absolute: 0 10*3/uL (ref 0.0–0.1)
Basophils Relative: 0 %
EOS PCT: 1 %
Eosinophils Absolute: 0.1 10*3/uL (ref 0.0–0.7)
LYMPHS ABS: 1.1 10*3/uL (ref 0.7–4.0)
LYMPHS PCT: 21 %
MONOS PCT: 10 %
Monocytes Absolute: 0.5 10*3/uL (ref 0.1–1.0)
Neutro Abs: 3.7 10*3/uL (ref 1.7–7.7)
Neutrophils Relative %: 68 %

## 2017-05-23 LAB — ETHANOL: Alcohol, Ethyl (B): <10 mg/dL (ref <10)

## 2017-05-23 LAB — APTT: aPTT: 28 seconds (ref 24–36)

## 2017-05-23 LAB — PROTIME-INR
INR: 1.18
Prothrombin Time: 14.9 seconds (ref 11.4–15.2)

## 2017-05-23 LAB — URINALYSIS, ROUTINE W REFLEX MICROSCOPIC
Bilirubin Urine: NEGATIVE
Glucose, UA: NEGATIVE mg/dL
Hgb urine dipstick: NEGATIVE
KETONES UR: 5 mg/dL — AB
Leukocytes, UA: NEGATIVE
NITRITE: NEGATIVE
Protein, ur: NEGATIVE mg/dL
Specific Gravity, Urine: 1.009 (ref 1.005–1.030)
pH: 6 (ref 5.0–8.0)

## 2017-05-23 LAB — I-STAT TROPONIN, ED: TROPONIN I, POC: 0.01 ng/mL (ref 0.00–0.08)

## 2017-05-23 LAB — I-STAT CHEM 8, ED
BUN: 4 mg/dL — AB (ref 6–20)
CALCIUM ION: 1.24 mmol/L (ref 1.15–1.40)
Chloride: 103 mmol/L (ref 101–111)
Creatinine, Ser: 0.7 mg/dL (ref 0.61–1.24)
GLUCOSE: 90 mg/dL (ref 65–99)
HCT: 35 % — ABNORMAL LOW (ref 39.0–52.0)
HEMOGLOBIN: 11.9 g/dL — AB (ref 13.0–17.0)
Potassium: 3.2 mmol/L — ABNORMAL LOW (ref 3.5–5.1)
Sodium: 143 mmol/L (ref 135–145)
TCO2: 26 mmol/L (ref 22–32)

## 2017-05-23 LAB — AMMONIA: AMMONIA: 37 umol/L — AB (ref 9–35)

## 2017-05-23 LAB — LIPASE, BLOOD: LIPASE: 26 U/L (ref 11–51)

## 2017-05-23 MED ORDER — POTASSIUM CHLORIDE CRYS ER 20 MEQ PO TBCR
60.0000 meq | EXTENDED_RELEASE_TABLET | Freq: Once | ORAL | Status: AC
  Administered 2017-05-23: 60 meq via ORAL
  Filled 2017-05-23: qty 3

## 2017-05-23 NOTE — Consult Note (Addendum)
Neurology Consultation  Reason for Consult: code stroke Referring Physician: Dr. Sabra Heck  CC: difficulty speaking  History is obtained from: Pt, sister at bedside  HPI: Norman Davis is a 77 y.o. 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  He was last seen normal at 11 AM when he went to take a nap.  He called his sister around 2 PM to get a cup of water complaining of headache.  She noted that his speech made no sense.  She brought him to the emergency room.  She was evaluated in the ER and there was concern for stroke because of his aphasia and presenting symptoms. Noncontrast CT of the head was obtained stat as a part of the stroke protocol-negative for acute process. On my examination initially, he did not have any signs suggestive for LVO, hence I did not proceed with any vessel imaging. Initial NIH stroke scale-3   LKW: 11 AM tpa given?: no, not likely stroke Premorbid modified Rankin scale (mRS): 3   ROS: Unable to reliably obtain due to altered mental status.   Past Medical History:  Diagnosis Date  . Cholelithiasis   . Fall 07/22/2015  . GERD (gastroesophageal reflux disease)   . H/O urinary retention   . Hypercholesteremia   . Hypertension   . Nephrolithiasis    right  . 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 Schizophrenia ([place])   . Shortness of breath dyspnea   . Small bowel obstruction due to adhesions [facility])     Family History  Problem Relation Age of Onset  .  Cancer Mother   . Cancer Father     Social History:   reports that he quit smoking about 17 years ago. His smoking use included cigarettes. He smoked 1.00 pack per day. he has never used smokeless tobacco. He reports that he does not drink alcohol or use drugs.  Medications No current facility-administered medications for this encounter.   Current Outpatient Medications:  .  acetaminophen (TYLENOL) 325 MG tablet, Take 2 tablets (650 mg total) by mouth every 6 (six) hours as needed for mild pain (or Fever >/= 101)., Disp: , Rfl:  .  albuterol (PROVENTIL HFA;VENTOLIN HFA) 108 (90 BASE) MCG/ACT inhaler, Inhale 2 puffs into the lungs every 4 (four) hours as needed for wheezing or shortness of breath., Disp: , Rfl:  .  Amino Acids-Protein Hydrolys (FEEDING SUPPLEMENT, PRO-STAT SUGAR FREE 64,) LIQD, Take 30 mLs by mouth 2 (two) times daily., Disp: 900 mL, Rfl: 0 .  amLODipine (NORVASC) 10 MG tablet, Take 10 mg by mouth daily., Disp: , Rfl:  .  cefTRIAXone (ROCEPHIN) IVPB, Inject 2 g into the vein every 12 (twelve) hours. Indication:  Vertebral Osteo/Diskitis Last Day of Therapy:  05/28/17 Labs - Once weekly:  CBC/D and BMP, Labs - Every other week:  ESR and CRP, Disp: 76 Units, Rfl: 0 .  cetirizine (ZYRTEC) 10 MG tablet, Take 10 mg by mouth at bedtime. , Disp: , Rfl:  .  Chlorhexidine Gluconate Cloth 2 % PADS, Apply 6 each topically daily.,  Disp: , Rfl:  .  Cholecalciferol (VITAMIN D) 2000 units tablet, Take 2,000 Units by mouth daily., Disp: , Rfl:  .  clopidogrel (PLAVIX) 75 MG tablet, Take 75 mg by mouth daily with breakfast., Disp: , Rfl:  .  doxazosin (CARDURA) 8 MG tablet, Take 8 mg by mouth at bedtime., Disp: , Rfl:  .  ENSURE PLUS (ENSURE PLUS) LIQD, Take 237 mLs by mouth 3 (three) times daily between meals., Disp: , Rfl:  .  finasteride (PROSCAR) 5 MG tablet, Take 5 mg by mouth daily., Disp: , Rfl:  .  FLUoxetine (PROZAC) 20 MG capsule, Take 40 mg by mouth daily. , Disp: , Rfl:  .   fluticasone (FLONASE) 50 MCG/ACT nasal spray, Place 2 sprays into both nostrils at bedtime., Disp: , Rfl:  .  folic acid (FOLVITE) 1 MG tablet, Take 1 mg by mouth daily., Disp: , Rfl:  .  losartan (COZAAR) 50 MG tablet, Take 25 mg by mouth daily., Disp: , Rfl:  .  Melatonin 3 MG CAPS, Take 1 capsule by mouth at bedtime., Disp: , Rfl:  .  Mirabegron (MYRBETRIQ PO), Take 25 mg by mouth daily. , Disp: , Rfl:  .  mirtazapine (REMERON) 30 MG tablet, Take 30 mg by mouth at bedtime., Disp: , Rfl:  .  mouth rinse LIQD solution, 15 mLs by Mouth Rinse route 2 (two) times daily., Disp: , Rfl: 0 .  paliperidone (INVEGA SUSTENNA) 156 MG/ML SUSP injection, Inject 156 mg into the muscle every 28 (twenty-eight) days., Disp: , Rfl:  .  Paliperidone Palmitate 546 MG/1.75ML SUSP, Inject 546 mg into the muscle every 3 (three) months., Disp: , Rfl:  .  pantoprazole (PROTONIX) 40 MG tablet, Take 40 mg by mouth daily., Disp: , Rfl:  .  polyethylene glycol (MIRALAX / GLYCOLAX) packet, Take 17 g by mouth daily as needed for moderate constipation., Disp: , Rfl:  .  pravastatin (PRAVACHOL) 40 MG tablet, Take 40 mg by mouth at bedtime. , Disp: , Rfl:  .  senna (SENOKOT) 8.6 MG TABS tablet, Take 1 tablet by mouth 2 (two) times daily. , Disp: , Rfl:  .  SPIRIVA HANDIHALER 18 MCG inhalation capsule, INHALE 1 PUFF VIA HANDIHALER EVERY FOR COPD, Disp: 30 capsule, Rfl: 1  Exam: Current vital signs: BP (!) 158/104 (BP Location: Left Arm)   Pulse 77   Temp 98.3 F (36.8 C) (Oral)   Resp 16   Ht '5\' 9"'$  (1.753 m)   Wt 73.5 kg (162 lb)   SpO2 100%   BMI 23.92 kg/m  Vital signs in last 24 hours: Temp:  [98.3 F (36.8 C)] 98.3 F (36.8 C) (01/22 1453) Pulse Rate:  [77] 77 (01/22 1453) Resp:  [16] 16 (01/22 1453) BP: (158)/(104) 158/104 (01/22 1453) SpO2:  [100 %] 100 % (01/22 1453) Weight:  [73.5 kg (162 lb)] 73.5 kg (162 lb) (01/22 1458)  GENERAL: Awake, alert in NAD HEENT: - Normocephalic and atraumatic, dry mm, no  LN++, no Thyromegally LUNGS - Clear to auscultation bilaterally with no wheezes CV - S1S2 RRR, no m/r/g, equal pulses bilaterally. ABDOMEN - Soft, nontender, nondistended with normoactive BS Ext: warm, well perfused, intact peripheral pulses, no edema  NEURO:  Mental Status: Alert awake oriented to self, place, month.  Told me the year is 2018.  Got his age wrong-said he is 77 years old. Language: speech is mildly dysarthric and generally slow.  Naming mildly impaired, repetition, fluency, and comprehension intact. Cranial Nerves: PERRL. EOMI,  visual fields full, mild left nasolabial fold flattening facial sensation intact, hearing intact, tongue/uvula/soft palate midline, normal sternocleidomastoid and trapezius muscle strength. No evidence of tongue atrophy or fibrillations Motor: No vertical drift in either of the 4 extremities but it does seem like his left upper and lower extremities are mildly weaker than the right side. Tone: is normal and bulk is normal Sensation- Intact to light touch bilaterally - no extinction Coordination: FTN intact bilaterally, no ataxia in BLE. Gait- deferred  NIHSS - 3   Labs Pending at the time of dictation. Prior labs below. CBC    Component Value Date/Time   WBC 7.3 04/20/2017 0517   RBC 3.44 (L) 04/20/2017 0517   HGB 9.8 (L) 04/20/2017 0517   HCT 30.6 (L) 04/20/2017 0517   PLT 193 04/20/2017 0517   MCV 89.0 04/20/2017 0517   MCH 28.5 04/20/2017 0517   MCHC 32.0 04/20/2017 0517   RDW 14.8 04/20/2017 0517   LYMPHSABS 1.1 04/12/2017 1440   MONOABS 1.2 (H) 04/12/2017 1440   EOSABS 0.0 04/12/2017 1440   BASOSABS 0.0 04/12/2017 1440    CMP     Component Value Date/Time   NA 137 04/20/2017 0517   K 3.1 (L) 04/20/2017 0517   CL 103 04/20/2017 0517   CO2 28 04/20/2017 0517   GLUCOSE 98 04/20/2017 0517   BUN 20 04/20/2017 0517   CREATININE 0.76 04/20/2017 0517   CALCIUM 9.2 04/20/2017 0517   PROT 6.6 04/13/2017 2214   ALBUMIN 2.4 (L)  04/15/2017 0343   AST 61 (H) 04/13/2017 2214   ALT 28 04/13/2017 2214   ALKPHOS 120 04/13/2017 2214   BILITOT 1.0 04/13/2017 2214   GFRNONAA >60 04/20/2017 0517   GFRAA >60 04/20/2017 0517    Lipid Panel     Component Value Date/Time   CHOL 122 04/29/2015 0500   TRIG 31 04/29/2015 0500   HDL 46 04/29/2015 0500   CHOLHDL 2.7 04/29/2015 0500   VLDL 6 04/29/2015 0500   LDLCALC 70 04/29/2015 0500     Imaging I have reviewed the images obtained: CT-scan of the brain -noncontrast CT of the head shows no acute changes.  No evidence of acute bleed.  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  Severe spinal stenosis.  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  Assessment:  78 year old man with staphylococcal lumbar vertebral infection on IV vancomycin, dementia, schizophrenia, hypertension hyperlipidemia with prior strokes in the past with some residual left-sided weakness presenting for evaluation of difficulty speaking, headache and increasing lethargy. His symptoms do not fit any particular vascular territory. His exam, presentation and clinical history are more suggestive of toxic metabolic encephalopathy rather than an acute ischemic stroke.  Not a candidate for IV TPA as he was outside the window lasting normal 11 M. Not a candidate for endovascular treatment as there is no indication of this being a clinical presentation of large vessel occlusion.  Impression: Toxic metabolic encephalopathy Low on differential is seizures Dementia  Recommendations: Obtain CBC BMP. Obtain ammonia levels Obtain urinalysis Correct any toxic metabolic derangements found on those labs. He is now back to baseline, consider seizures as an underlying etiology.  Can then admit and do inpatient EEG, and an MRI. Ensure that the lumbar vertebral  infection is being adequately treated with the IV vancomycin. Neurology will be available as needed.  Please call with questions.  If  you decide to admit the patient, please recall.  -- Amie Portland, MD [facility] Pager: [phone] If 7pm to 7am, please call on call as listed on AMION.  CRITICAL CARE ATTESTATION This patient is critically ill and at significant risk of neurological worsening, death and care requires constant monitoring of vital signs, hemodynamics,respiratory and cardiac monitoring. I spent 30  minutes of neurocritical care time performing neurological assessment, discussion with family, other specialists and medical decision making of high complexityin the care of  this patient.

## 2017-05-23 NOTE — ED Notes (Signed)
Condom cath placed on pt 

## 2017-05-23 NOTE — ED Triage Notes (Signed)
Pt arrives to ED from home via ems with complaints of "talking funny and lethargy" per sister since 1100 this morning. EMS reports pt has hx of stroke and has left sided weakness baseline. Pt has hx of dementia and is oriented to self baselife. Pt placed in position of comfort with bed locked and lowered, call bell in reach.

## 2017-05-23 NOTE — ED Provider Notes (Signed)
The patient is a 77 year old male, there is a history of dementia schizophrenia as well as multiple other medical issues, his family reports that today he had called her just before 1:00 and was speaking more clearly than he is now, according to the family members he does have some very slowed speech at baseline but they felt he was slurring his speech more than that.  There was no other complaints including fevers vomiting coughing or diarrhea.  On my exam the patient is awake and alert and able to follow very simple commands though he does not have a very easy time speaking, he is able to identify objects, he is able to answer to me his name and follow simple commands with both of his hands and both of his legs.  He is generally weak but otherwise has according to his family members baseline appearance other than his speech.  Neurology was consulted and requested evaluation for possible stroke, they have requested to discontinue the code stroke as this is more likely to be encephalopathic metabolic or infectious.  CT scan labs have been ordered.  Medical screening examination/treatment/procedure(s) were conducted as a shared visit with non-physician practitioner(s) and myself.  I personally evaluated the patient during the encounter.  Clinical Impression:   Final diagnoses:  Altered mental status, unspecified altered mental status type  Slurred speech  Facial droop  Stroke-like symptoms  Lower abdominal pain  Constipation, unspecified constipation type  Nonintractable headache, unspecified chronicity pattern, unspecified headache type  History of CVA (cerebrovascular accident)  Chronic anemia  Hypokalemia  Hypoalbuminemia         Eber HongMiller, Afton Mikelson, MD 05/24/17 1501

## 2017-05-23 NOTE — H&P (Signed)
History and Physical    [name] DIY:[id number] DOB: 09/05/1940 DOA: 05/23/2017  Referring MD/NP/PA: Dr Sabra Heck  PCP: Henreitta Cea, MD   Outpatient Specialists: Wendie Agreste  Patient coming from: Home  Chief Complaint: Slurred speech  HPI: Norman Davis is a 77 y.o. male with medical history significant of Dementia and Schizophrenia who was brought in with AMS and slurred speech. He had incidental CVA in December when he was initiated on IV antibiotics for Staphylococcus infection of the spine. He is on treatement still. He has no focal weakness.  He has residual left sided weakness. He was seen in the ER about 3 hours after the onset of symptoms. Patient's symptoms have now improved. Head CT without contrast was negative. He has been evaluated by neurology and we will admit him for TIA workup. He still confused but has history of dementia and appears to be at his baseline. Family said he is speaking more clearly at his baseline level.  ED Course: Patient had CT head without contrast that is negative for acute CVA. He is not a candidate for TPA and neurology has evaluated patient. TIA is therefore suspected  Review of Systems: As per HPI otherwise 10 point review of systems negative.    Past Medical History:  Diagnosis Date  . Cholelithiasis   . Fall 07/22/2015  . GERD (gastroesophageal reflux disease)   . H/O urinary retention   . Hypercholesteremia   . Hypertension   . Nephrolithiasis    right  . 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 Schizophrenia ([place])   . Shortness of breath dyspnea   . Small bowel obstruction due to adhesions [facility])     Past Surgical History:  Procedure Laterality Date  . BACK SURGERY    . IR LUMBAR [place] W/IMG GUIDE  04/13/2017  . LIVER SURGERY    . PELVIC FRACTURE SURGERY    . TOTAL HIP ARTHROPLASTY       reports that he quit smoking about 17 years ago. His smoking use included cigarettes. He smoked 1.00 pack per day. he  has never used smokeless tobacco. He reports that he does not drink alcohol or use drugs.  Allergies  Allergen Reactions  . Aspirin     unknown  . Penicillins     Unknown Has patient had a PCN reaction causing immediate rash, facial/tongue/throat swelling, SOB or lightheadedness with hypotension: NO Has patient had a PCN reaction causing severe rash involving mucus membranes or skin necrosis: NO Has patient had a PCN reaction that required hospitalization NO Has patient had a PCN reaction occurring within the last 10 years: NO If all of the above answers are "NO", then may proceed with Cephalosporin use.    Family History  Problem Relation Age of Onset  . Cancer Mother   . Cancer Father     Prior to Admission medications   Medication Sig Start Date End Date Taking? Authorizing Provider  acetaminophen (TYLENOL) 325 MG tablet Take 2 tablets (650 mg total) by mouth every 6 (six) hours as needed for mild pain (or Fever >/= 101). 04/20/17  Yes Mikhail, Clinical biochemist, DO  albuterol (PROVENTIL HFA;VENTOLIN HFA) 108 (90 BASE) MCG/ACT inhaler Inhale 2 puffs into the lungs every 4 (four) hours as needed for wheezing or shortness of breath.   Yes [provider]  amLODipine (NORVASC) 10 MG tablet Take 10 mg by mouth daily.   Yes [provider]  cefTRIAXone (ROCEPHIN) IVPB Inject 2  g into the vein every 12 (twelve) hours. Indication:  Vertebral Osteo/Diskitis Last Day of Therapy:  05/28/17 Labs - Once weekly:  CBC/D and BMP, Labs - Every other week:  ESR and CRP 04/20/17 05/30/17 Yes Mikhail, [place], DO  Chlorhexidine Gluconate Cloth 2 % PADS Apply 6 each topically daily. 04/20/17  Yes Mikhail, Velta Addison, DO  Cholecalciferol (VITAMIN D) 2000 units tablet Take 2,000 Units by mouth daily.   Yes [provider]  clopidogrel (PLAVIX) 75 MG tablet Take 75 mg by mouth daily with breakfast.   Yes [provider]  doxazosin (CARDURA) 8 MG tablet Take 8 mg by mouth at bedtime.    Yes [provider]  ENSURE PLUS (ENSURE PLUS) LIQD Take 237 mLs by mouth 3 (three) times daily between meals.   Yes [provider]  finasteride (PROSCAR) 5 MG tablet Take 5 mg by mouth daily.   Yes [provider]  FLUoxetine (PROZAC) 20 MG capsule Take 40 mg by mouth daily.    Yes [provider]  fluticasone (FLONASE) 50 MCG/ACT nasal spray Place 2 sprays into both nostrils at bedtime as needed for allergies or rhinitis.    Yes [provider]  folic acid (FOLVITE) 1 MG tablet Take 1 mg by mouth daily.   Yes [provider]  losartan (COZAAR) 50 MG tablet Take 75 mg by mouth daily.    Yes [provider]  Melatonin 3 MG CAPS Take 1 capsule by mouth at bedtime.   Yes [provider]  Mirabegron (MYRBETRIQ PO) Take 25 mg by mouth daily.    Yes [provider]  mirtazapine (REMERON) 30 MG tablet Take 30 mg by mouth at bedtime.   Yes [provider]  mouth rinse LIQD solution 15 mLs by Mouth Rinse route 2 (two) times daily. Patient taking differently: 15 mLs by Mouth Rinse route daily.  04/20/17  Yes Mikhail, Velta Addison, DO  Paliperidone Palmitate 546 MG/1.75ML SUSP Inject 546 mg into the muscle every 3 (three) months.   Yes [provider]  polyethylene glycol (MIRALAX / GLYCOLAX) packet Take 17 g by mouth daily.    Yes [provider]  pravastatin (PRAVACHOL) 40 MG tablet Take 40 mg by mouth at bedtime.    Yes [provider]  senna (SENOKOT) 8.6 MG TABS tablet Take 1 tablet by mouth 2 (two) times daily.    Yes [provider]  Amino Acids-Protein Hydrolys (FEEDING SUPPLEMENT, PRO-STAT SUGAR FREE 64,) LIQD Take 30 mLs by mouth 2 (two) times daily. Patient not taking: Reported on 05/23/2017 04/20/17   Cristal Ford, DO  SPIRIVA HANDIHALER 18 MCG inhalation capsule INHALE 1 PUFF VIA HANDIHALER EVERY FOR COPD Patient not taking: Reported on 05/23/2017 08/21/15   Hennie Duos, MD    Physical Exam: Vitals:   05/23/17 1800 05/23/17 1900 05/23/17 1945 05/23/17 2000  BP: (!) 138/103 (!) 145/95 (!) 155/98 (!) 163/92  Pulse:      Resp: _0 Temp:      TempSrc:      SpO2:      Weight:      Height:          Constitutional: NAD, calm, comfortable Vitals:   05/23/17 1800 05/23/17 1900 05/23/17 1945 05/23/17 2000  BP: (!) 138/103 (!) 145/95 (!) 155/98 (!) 163/92  Pulse:      Resp: _1 Temp:      TempSrc:      SpO2:  Weight:      Height:       Eyes: PERRL, lids and conjunctivae normal ENMT: Mucous membranes are moist. Posterior pharynx clear of any exudate or lesions.Normal dentition.  Neck: normal, supple, no masses, no thyromegaly Respiratory: clear to auscultation bilaterally, no wheezing, no crackles. Normal respiratory effort. No accessory muscle use.  Cardiovascular: Regular rate and rhythm, no murmurs / rubs / gallops. No extremity edema. 2+ pedal pulses. No carotid bruits.  Abdomen: no tenderness, no masses palpated. No hepatosplenomegaly. Bowel sounds positive.  Musculoskeletal: no clubbing / cyanosis. No joint deformity upper and lower extremities. Good ROM, no contractures. Normal muscle tone.  Skin: no rashes, lesions, ulcers. No induration Neurologic: Confused but communicating CN 2-12 grossly intact. Sensation intact, DTR normal. Strength 5/5 in all 4.  Psychiatric: Confused, no agitation3. Normal mood.   Labs on Admission: I have personally reviewed following labs and imaging studies  CBC: Recent Labs  Lab 05/23/17 1800 05/23/17 1813  WBC 5.4  --   NEUTROABS 3.7  --   HGB 11.1* 11.9*  HCT 34.5* 35.0*  MCV 88.9  --   PLT 199  --    Basic Metabolic Panel: Recent Labs  Lab 05/23/17 1800 05/23/17 1813  NA 141 143  K 3.2* 3.2*  CL 105 103  CO2 25  --   GLUCOSE 93 90  BUN 5* 4*  CREATININE 0.82 0.70  CALCIUM 9.7  --    GFR: Estimated Creatinine Clearance: 76.1 mL/min (by C-G formula based on  SCr of 0.7 mg/dL). Liver Function Tests: Recent Labs  Lab 05/23/17 1800  AST 17  ALT 10*  ALKPHOS 127*  BILITOT 0.7  PROT 6.5  ALBUMIN 2.9*   Recent Labs  Lab 05/23/17 1800  LIPASE 26   Recent Labs  Lab 05/23/17 1926  AMMONIA 37*   Coagulation Profile: Recent Labs  Lab 05/23/17 1800  INR 1.18   Cardiac Enzymes: No results for input(s): CKTOTAL, CKMB, CKMBINDEX, TROPONINI in the last 168 hours. BNP (last 3 results) No results for input(s): PROBNP in the last 8760 hours. HbA1C: No results for input(s): HGBA1C in the last 72 hours. CBG: No results for input(s): GLUCAP in the last 168 hours. Lipid Profile: No results for input(s): CHOL, HDL, LDLCALC, TRIG, CHOLHDL, LDLDIRECT in the last 72 hours. Thyroid Function Tests: No results for input(s): TSH, T4TOTAL, FREET4, T3FREE, THYROIDAB in the last 72 hours. Anemia Panel: No results for input(s): VITAMINB12, FOLATE, FERRITIN, TIBC, IRON, RETICCTPCT in the last 72 hours. Urine analysis:    Component Value Date/Time   COLORURINE YELLOW 05/23/2017 1855   APPEARANCEUR CLEAR 05/23/2017 1855   LABSPEC 1.009 05/23/2017 1855   PHURINE 6.0 05/23/2017 1855   GLUCOSEU NEGATIVE 05/23/2017 1855   HGBUR NEGATIVE 05/23/2017 1855   BILIRUBINUR NEGATIVE 05/23/2017 1855   KETONESUR 5 (A) 05/23/2017 1855   PROTEINUR NEGATIVE 05/23/2017 1855   UROBILINOGEN 1.0 03/16/2013 1602   NITRITE NEGATIVE 05/23/2017 1855   LEUKOCYTESUR NEGATIVE 05/23/2017 1855   Sepsis Labs: _0 (procalcitonin:4,lacticidven:4) )No results found for this or any previous visit (from the past 240 hour(s)).   Radiological Exams on Admission: Dg Abd Acute W/chest  Result Date: 05/23/2017 CLINICAL DATA:  Constipation.  Evaluate for obstruction. EXAM: DG ABDOMEN ACUTE W/ 1V CHEST COMPARISON:  04/19/2017 FINDINGS: Formed stool mainly in the descending and transverse colon. Bowel gas is less extensive than on prior. No rectal impaction. No concerning mass  effect or gas collection. IVC filter with stable rotation. Remote lumbosacral fusion  and left hemipelvis repair. Severe L3-4/L4-5 disc disease as seen on recent MRI. 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 Normal heart size. Mild aortic tortuosity. Emphysematous changes greater on the right. Right upper extremity PICC with tip at the SVC. There is no edema, consolidation, effusion, or pneumothorax. IMPRESSION: 1. Negative for bowel obstruction or excessive stool retention. 2. Emphysema.  No acute finding in the chest. Electronically Signed   By: Monte Fantasia M.D.   On: 05/23/2017 16:22   Ct Head Code Stroke Wo Contrast  Result Date: 05/23/2017 CLINICAL DATA:  Code stroke. Left-sided weakness. Speech disturbance. Lethargy. History of stroke. EXAM: CT HEAD WITHOUT CONTRAST TECHNIQUE: Contiguous axial images were obtained from the base of the skull through the vertex without intravenous contrast. COMPARISON:  Head CT 04/12/2017 and MRI 04/16/2017 FINDINGS: Brain: There is no evidence of acute infarct, intracranial hemorrhage, mass, midline shift, or extra-axial fluid collection. Chronic lacunar infarcts are again noted in the basal ganglia, thalami, and posterior right frontal subcortical white matter. Patchy to confluent hypodensities elsewhere in the cerebral white matter bilaterally are similar to the prior CT and nonspecific but compatible with moderate chronic small vessel ischemic disease. There is moderate cerebral atrophy. Vascular: Calcified atherosclerosis at the skull base. No hyperdense vessel. Skull: No fracture or suspicious osseous lesion. Sinuses/Orbits: Increased left frontal sinus and left ethmoid air cell opacification with a fluid level noted in the frontal sinus. Persistent right mastoid effusion. Left cataract extraction. Other: None. ASPECTS [facility] Stroke Program Early CT Score) - Ganglionic level infarction (caudate, lentiform nuclei, internal capsule, insula, M1-M3  cortex): 7 - Supraganglionic infarction (M4-M6 cortex): 3 Total score (0-10 with 10 being normal): 10 IMPRESSION: 1. No evidence of acute intracranial abnormality. 2. ASPECTS is 10. 3. Chronic ischemia as above. These results were communicated to Dr. Rory Percy at 3:59 pm on 05/23/2017 by text page via the [facility] messaging system. Electronically Signed   By: Logan Bores M.D.   On: 05/23/2017 15:59    EKG: Independently reviewed.   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 He's had workup back then including MRI. We'll admit him for observation on telemetry. Check another MRI of the brain and carotid Dopplers. He had an echocardiogram just a month ago so we will not repeat it. PT and OT consultation and continue according to neurology  #2 acute discitis: Patient currently on IV Rocephin by infectious disease. We will continue the current home regimen unchanged.  #3 dementia: Patient appears to be at baseline. No further change in medications  #4 COPD: No exacerbation at the moment.   DVT prophylaxis: Heparin   Code Status: Full   Family Communication: No family at bedside.  Sister his primary caregiver at home  Disposition Plan: Home with home health.    Consults called: Neurology Dr. Weldon Picking   Admission status: Observation   Severity of Illness: The appropriate patient status for this patient is OBSERVATION. Observation status is judged to be reasonable and necessary in order to provide the required intensity of service to ensure the patient's safety. The patient's presenting symptoms, physical exam findings, and initial radiographic and laboratory data in the context of their medical condition is felt  to place them at decreased risk  for further clinical deterioration. Furthermore, it is anticipated that the patient will be medically stable for discharge from the hospital within 2 midnights of admission. The following factors support the patient status of observation.   " The patient's presenting symptoms include confusion and slurred speech. " The physical exam findings include no focal weakness. " The initial radiographic and laboratory data are negative head CT without contrast.     Barbette Merino MD [facility] Pager [phone]  If 7PM-7AM, please contact night-coverage [email or web address] Password [facility]  05/23/2017, 8:35 PM

## 2017-05-23 NOTE — ED Notes (Signed)
Pt's sister updated via telephone. Requesting pt receive 2g Rocephin IV while at the hospital because that's what he gets at home for his spinal infection.  SisterTonna Corner- Lily phone number [phone]

## 2017-05-23 NOTE — ED Notes (Signed)
Pt complaining of headache, PA bedside

## 2017-05-23 NOTE — ED Notes (Signed)
Code stroke cancelled per Neurologist. Pt transported from CT to xray

## 2017-05-23 NOTE — ED Notes (Signed)
Iv team consulted regarding PICC accessibility and necessary steps before blood is drawn from it, unable to draw blood from other sites

## 2017-05-23 NOTE — ED Notes (Signed)
Patient transported to MRI 

## 2017-05-23 NOTE — ED Notes (Addendum)
Gave pt graham crackers and ice water, per Aundra MilletMegan - RN.

## 2017-05-23 NOTE — ED Notes (Signed)
Hospitalist bedside 

## 2017-05-23 NOTE — Code Documentation (Signed)
77[facility] at [phone].  Patient from home where he was noted to have abnormal speech and lethargy. LKW 1100.  Code stroke called in the ED.  Stroke team to the bedside.  CT completed.  NIHSS 3, see documentation for details and code stroke times.  Patient stated incorrect age, mild left facial asymmetry and questionable aphasia on exam.  Patient is outside the window for treatment with tPA. No acute stroke treatment at this time.  Code stroke canceled per Dr. Wilford CornerArora.  Bedside handoff with ED RN Aundra MilletMegan.

## 2017-05-23 NOTE — ED Provider Notes (Signed)
[facility] EMERGENCY DEPARTMENT Provider Note   CSN: [id number] Arrival date & time: 05/23/17  1449     History   Chief Complaint Chief Complaint  Patient presents with  . Stroke Symptoms    HPI Norman Davis is a 77 y.o. male with a PMHx of dementia, schizophrenia, paget's disease of the bone, HTN, HLD, nephrolithiasis, GERD, BPH, COPD, NSTEMI, CVA, and other conditions listed below, who presents to the ED via EMS from home with complaints of acute onset garbled speech, L eyelid drooping, and answering questions incorrectly, per his sister. LEVEL 5 CAVEAT DUE TO DEMENTIA AND AMS, most of history provided by his sister.  Patient's sister states that he called her just before 1 PM and was speaking clearly, complained of a headache, and when she arrived at home she found him with garbled speech and answering questions incorrectly.  He complains of a sharp 8/10 intermittent nonradiating headache at the top and front of his head with no known aggravating or alleviating factors, cannot state when exactly it started but states that he was laying in bed when it began.  He also complains of lower abdominal pain and constipation x4 days for which the sister has been giving him miralax.  He is still on an antibiotic for his recent discitis, was discharged with Rocephin but the sister is not sure whether this is what he still on or not.  He was briefly at a nursing home however his "time ran out" and therefore he is now living at home with his sister.  His PCP is at the [facility] in [place].  Sister states that he is usually oriented normally, and seems confused today as of 1pm.  They deny any recent cough or URI symptoms, fevers, or any other acute illness symptoms.  Sister denies that he had any slurred speech, just states that it was very garbled and difficult to understand.  Patient denies any chest pain, shortness of breath, nausea, vomiting, diarrhea, obstipation, hematuria, malodorous  urine, vision changes, numbness, tingling, new focal weakness, or any other complaints at this time.  Remainder of HPI/ROS limited due to AMS and dementia.   Of note, chart review reveals he was admitted on 12/12-20/18 for lumbar discitis and osteomyelitis, had some lethargy/confusion during hospitalization so brain MRI was performed and showed a possible early or subacute L cerebellum infarct, but this was felt to be due to cerebral vasospasm given his inflammatory state related to his infection.    The history is provided by the patient, medical records and a relative. The history is limited by the condition of the patient. No language interpreter was used.  Altered Mental Status   This is a new problem. The current episode started 1 to 2 hours ago. The problem has not changed since onset.Associated symptoms include confusion. Pertinent negatives include no weakness. Risk factors include a recent illness and a recent infection. His past medical history is significant for CVA, COPD and dementia.    Past Medical History:  Diagnosis Date  . Cholelithiasis   . Fall 07/22/2015  . GERD (gastroesophageal reflux disease)   . H/O urinary retention   . Hypercholesteremia   . Hypertension   . Nephrolithiasis    right  . 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 Schizophrenia ([place])   . Shortness of breath dyspnea   . Small bowel obstruction due to adhesions [facility])     Patient Active Problem List   Diagnosis  Date Noted  . 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 Cerebrovascular accident (CVA) ([place])   . 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  . Tachycardia 04/12/2017  . Spinal stenosis 08/07/2015  . Absence of bladder continence 08/07/2015  . Pulmonary emphysema ([place]) 08/07/2015  . Essential hypertension 08/07/2015  . Depression, major, recurrent, moderate ([place]) 07/30/2015    Class: Chronic  . GERD without esophagitis 07/28/2015  . BPH (benign prostatic hyperplasia)  07/28/2015  . UI (urinary incontinence) 07/28/2015  . Fall 07/22/2015  . HLD (hyperlipidemia) 07/22/2015  . Depression 07/22/2015  . Rhabdomyolysis 07/22/2015  . Hypokalemia 07/22/2015  . Elevated CK   . COPD exacerbation ([place]) 06/12/2015  . Elevated troponin 06/12/2015  . Elevated d-dimer   . Leg pain, left 04/28/2015  . Chest pain 04/28/2015  . NSTEMI (non-ST elevated myocardial infarction) ([place]) 04/28/2015  . Abdominal pain 04/28/2015  . COPD (chronic obstructive pulmonary disease) ([place]) 04/11/2013  . Hypercholesteremia   . Hypertension   . H/O urinary retention   . Myocardial infarction ([place])   . Gallstones 03/17/2013  . Renal stone 03/17/2013  . SBO (small bowel obstruction) ([place]) 03/16/2013  . AKI (acute kidney injury) ([place]) 03/16/2013  . High anion gap metabolic acidosis 51/88/4166  . Schizophrenia in remission ([place]) 03/16/2013    Past Surgical History:  Procedure Laterality Date  . BACK SURGERY    . IR LUMBAR [place] W/IMG GUIDE  04/13/2017  . LIVER SURGERY    . PELVIC FRACTURE SURGERY    . TOTAL HIP ARTHROPLASTY         Home Medications    Prior to Admission medications   Medication Sig Start Date End Date Taking? Authorizing Provider  acetaminophen (TYLENOL) 325 MG tablet Take 2 tablets (650 mg total) by mouth every 6 (six) hours as needed for mild pain (or Fever >/= 101). 04/20/17   Mikhail, Velta Addison, DO  albuterol (PROVENTIL HFA;VENTOLIN HFA) 108 (90 BASE) MCG/ACT inhaler Inhale 2 puffs into the lungs every 4 (four) hours as needed for wheezing or shortness of breath.    [provider]  Amino Acids-Protein Hydrolys (FEEDING SUPPLEMENT, PRO-STAT SUGAR FREE 64,) LIQD Take 30 mLs by mouth 2 (two) times daily. 04/20/17   Mikhail, Velta Addison, DO  amLODipine (NORVASC) 10 MG tablet Take 10 mg by mouth daily.    [provider]  cefTRIAXone (ROCEPHIN) IVPB Inject 2 g into the vein every 12 (twelve) hours. Indication:  Vertebral  Osteo/Diskitis Last Day of Therapy:  05/28/17 Labs - Once weekly:  CBC/D and BMP, Labs - Every other week:  ESR and CRP 04/20/17 05/30/17  Mikhail, Velta Addison, DO  cetirizine (ZYRTEC) 10 MG tablet Take 10 mg by mouth at bedtime.     [provider]  Chlorhexidine Gluconate Cloth 2 % PADS Apply 6 each topically daily. 04/20/17   Cristal Ford, DO  Cholecalciferol (VITAMIN D) 2000 units tablet Take 2,000 Units by mouth daily.    [provider]  clopidogrel (PLAVIX) 75 MG tablet Take 75 mg by mouth daily with breakfast.    [provider]  doxazosin (CARDURA) 8 MG tablet Take 8 mg by mouth at bedtime.    [provider]  ENSURE PLUS (ENSURE PLUS) LIQD Take 237 mLs by mouth 3 (three) times daily between meals.    [provider]  finasteride (PROSCAR) 5 MG tablet Take 5 mg by mouth daily.    [provider]  FLUoxetine (PROZAC) 20 MG capsule Take 40 mg by mouth daily.  [provider]  fluticasone (FLONASE) 50 MCG/ACT nasal spray Place 2 sprays into both nostrils at bedtime.    [provider]  folic acid (FOLVITE) 1 MG tablet Take 1 mg by mouth daily.    [provider]  losartan (COZAAR) 50 MG tablet Take 25 mg by mouth daily.    [provider]  Melatonin 3 MG CAPS Take 1 capsule by mouth at bedtime.    [provider]  Mirabegron (MYRBETRIQ PO) Take 25 mg by mouth daily.     [provider]  mirtazapine (REMERON) 30 MG tablet Take 30 mg by mouth at bedtime.    [provider]  mouth rinse LIQD solution 15 mLs by Mouth Rinse route 2 (two) times daily. 04/20/17   Mikhail, Velta Addison, DO  paliperidone (INVEGA SUSTENNA) 156 MG/ML SUSP injection Inject 156 mg into the muscle every 28 (twenty-eight) days.    [provider]  Paliperidone Palmitate 546 MG/1.75ML SUSP Inject 546 mg into the muscle every 3 (three) months.    [provider]  pantoprazole (PROTONIX) 40 MG  tablet Take 40 mg by mouth daily.    [provider]  polyethylene glycol (MIRALAX / GLYCOLAX) packet Take 17 g by mouth daily as needed for moderate constipation.    [provider]  pravastatin (PRAVACHOL) 40 MG tablet Take 40 mg by mouth at bedtime.     [provider]  senna (SENOKOT) 8.6 MG TABS tablet Take 1 tablet by mouth 2 (two) times daily.     [provider]  SPIRIVA HANDIHALER 18 MCG inhalation capsule INHALE 1 PUFF VIA HANDIHALER EVERY FOR COPD 08/21/15   Hennie Duos, MD    Family History Family History  Problem Relation Age of Onset  . Cancer Mother   . Cancer Father     Social History Social History   Tobacco Use  . Smoking status: Former Smoker    Packs/day: 1.00    Types: Cigarettes    Last attempt to quit: 05/02/2000    Years since quitting: 17.0  . Smokeless tobacco: Never Used  Substance Use Topics  . Alcohol use: No  . Drug use: No     Allergies   Aspirin and Penicillins   Review of Systems Review of Systems  Unable to perform ROS: Dementia  Constitutional: Negative for fever.  HENT: Negative for rhinorrhea.   Eyes: Negative for visual disturbance.  Respiratory: Negative for cough and shortness of breath.   Cardiovascular: Negative for chest pain.  Gastrointestinal: Positive for abdominal pain and constipation. Negative for diarrhea, nausea and vomiting.  Genitourinary: Negative for hematuria.       No malodorous urine  Allergic/Immunologic: Negative for immunocompromised state.  Neurological: Positive for facial asymmetry, speech difficulty and headaches. Negative for weakness and numbness.  Hematological: Bruises/bleeds easily (on plavix).  Psychiatric/Behavioral: Positive for confusion.  LEVEL 5 CAVEAT DUE TO DEMENTIA AND AMS   Physical Exam Updated Vital Signs BP (!) 158/104 (BP Location: Left Arm)   Pulse 77   Temp 98.3 F (36.8 C) (Oral)   Resp 16   Ht '5\' 9"'  (1.753 m)   Wt 73.5 kg (162 lb)    SpO2 100%   BMI 23.92 kg/m   Physical Exam  Constitutional: Vital signs are normal. He appears well-developed and well-nourished.  Non-toxic appearance. No distress.  Afebrile, nontoxic, NAD  HENT:  Head: Normocephalic and atraumatic.  Mouth/Throat: Uvula is midline, oropharynx is clear and moist and mucous membranes are  normal. No trismus in the jaw. No uvula swelling.  No facial asymmetry or drool Uvula midline, tongue protrusion symmetric.   Eyes: Conjunctivae and EOM are normal. Right eye exhibits no discharge. Left eye exhibits no discharge. Right pupil is round. Left pupil is round. Pupils are equal.  Pupils pinpoint bilaterally, ?IOL on L side, don't seem to react to light but hard to tell due to pinpoint nature of pupils; pupils symmetric bilaterally; EOMI, no nystagmus although pt not very good at following fixed point continuously  Neck: Normal range of motion. Neck supple. No spinous process tenderness and no muscular tenderness present. No neck rigidity. Normal range of motion present.  FROM intact without spinous process TTP, no bony stepoffs or deformities, no paraspinous muscle TTP or muscle spasms. No rigidity or meningeal signs. No bruising or swelling.   Cardiovascular: Normal rate, regular rhythm, normal heart sounds and intact distal pulses. Exam reveals no gallop and no friction rub.  No murmur heard. Pulmonary/Chest: Effort normal and breath sounds normal. No respiratory distress. He has no decreased breath sounds. He has no wheezes. He has no rhonchi. He has no rales.  Abdominal: Soft. Normal appearance and bowel sounds are normal. He exhibits no distension. There is generalized tenderness. There is no rigidity, no rebound, no guarding, no CVA tenderness, no tenderness at McBurney's point and negative Murphy's sign.  Soft, nondistended, +BS throughout, with mild generalized abd TTP, no r/g/r, neg murphy's, neg mcburney's, no CVA TTP   Musculoskeletal: Normal range of  motion.  MAE x4 Strength and sensation symmetric and at baseline in all extremities, weakness in BLE which appears to be baseline, no unilateral weakness in any extremity Distal pulses intact  Neurological: He is alert. He is disoriented. He displays tremor. No cranial nerve deficit or sensory deficit. Coordination abnormal. GCS eye subscore is 4. GCS verbal subscore is 4.  CN 2-12 grossly intact A&O to person and somewhat to place (knows he's at [place], but thinks he's in [place]); disoriented to time Speech garbled Sensation and strength grossly intact at baseline (chronic BLE weakness noted) Gait not assessed Coordination with finger-to-nose slow and difficult with both hands, finger becomes tremulous as he approaches the target Possible pronator drift on L side although hard to tell due to pt not fully extending arms to begin with, but slight drift noted in left hand.   Skin: Skin is warm, dry and intact. No rash noted.  Psychiatric: He has a normal mood and affect. Cognition and memory are impaired.  Nursing note and vitals reviewed.    ED Treatments / Results  Labs (all labs ordered are listed, but only abnormal results are displayed) Labs Reviewed  CBC - Abnormal; Notable for the following components:      Result Value   RBC 3.88 (*)    Hemoglobin 11.1 (*)    HCT 34.5 (*)    All other components within normal limits  COMPREHENSIVE METABOLIC PANEL - Abnormal; Notable for the following components:   Potassium 3.2 (*)    BUN 5 (*)    Albumin 2.9 (*)    ALT 10 (*)    Alkaline Phosphatase 127 (*)    All other components within normal limits  URINALYSIS, ROUTINE W REFLEX MICROSCOPIC - Abnormal; Notable for the following components:   Ketones, ur 5 (*)    All other components within normal limits  AMMONIA - Abnormal; Notable for the following components:   Ammonia 37 (*)    All other components  within normal limits  I-STAT CHEM 8, ED - Abnormal; Notable for the following  components:   Potassium 3.2 (*)    BUN 4 (*)    Hemoglobin 11.9 (*)    HCT 35.0 (*)    All other components within normal limits  ETHANOL  PROTIME-INR  APTT  DIFFERENTIAL  LIPASE, BLOOD  RAPID URINE DRUG SCREEN, HOSP PERFORMED  I-STAT TROPONIN, ED    EKG  EKG Interpretation  Date/Time:  Tuesday May 23 2017 15:32:13 EST Ventricular Rate:  77 PR Interval:    QRS Duration: 157 QT Interval:  411 QTC Calculation: 466 R Axis:   -87 Text Interpretation:  Sinus or ectopic atrial rhythm RBBB and LAFB Inferior infarct, old Lateral leads are also involved Since last tracing rate slower Confirmed by Noemi Chapel [phone]) on 05/23/2017 3:36:20 PM       Radiology Dg Abd Acute W/chest  Result Date: 05/23/2017 CLINICAL DATA:  Constipation.  Evaluate for obstruction. EXAM: DG ABDOMEN ACUTE W/ 1V CHEST COMPARISON:  04/19/2017 FINDINGS: Formed stool mainly in the descending and transverse colon. Bowel gas is less extensive than on prior. No rectal impaction. No concerning mass effect or gas collection. IVC filter with stable rotation. Remote lumbosacral fusion and left hemipelvis repair. Severe L3-4/L4-5 disc disease as seen on recent MRI. 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 Normal heart size. Mild aortic tortuosity. Emphysematous changes greater on the right. Right upper extremity PICC with tip at the SVC. There is no edema, consolidation, effusion, or pneumothorax. IMPRESSION: 1. Negative for bowel obstruction or excessive stool retention. 2. Emphysema.  No acute finding in the chest. Electronically Signed   By: Monte Fantasia M.D.   On: 05/23/2017 16:22   Ct Head Code Stroke Wo Contrast  Result Date: 05/23/2017 CLINICAL DATA:  Code stroke. Left-sided weakness. Speech disturbance. Lethargy. History of stroke. EXAM: CT HEAD WITHOUT CONTRAST TECHNIQUE: Contiguous axial images were obtained from the base of the skull through the vertex without intravenous contrast.  COMPARISON:  Head CT 04/12/2017 and MRI 04/16/2017 FINDINGS: Brain: There is no evidence of acute infarct, intracranial hemorrhage, mass, midline shift, or extra-axial fluid collection. Chronic lacunar infarcts are again noted in the basal ganglia, thalami, and posterior right frontal subcortical white matter. Patchy to confluent hypodensities elsewhere in the cerebral white matter bilaterally are similar to the prior CT and nonspecific but compatible with moderate chronic small vessel ischemic disease. There is moderate cerebral atrophy. Vascular: Calcified atherosclerosis at the skull base. No hyperdense vessel. Skull: No fracture or suspicious osseous lesion. Sinuses/Orbits: Increased left frontal sinus and left ethmoid air cell opacification with a fluid level noted in the frontal sinus. Persistent right mastoid effusion. Left cataract extraction. Other: None. ASPECTS [facility] Stroke Program Early CT Score) - Ganglionic level infarction (caudate, lentiform nuclei, internal capsule, insula, M1-M3 cortex): 7 - Supraganglionic infarction (M4-M6 cortex): 3 Total score (0-10 with 10 being normal): 10 IMPRESSION: 1. No evidence of acute intracranial abnormality. 2. ASPECTS is 10. 3. Chronic ischemia as above. These results were communicated to Dr. Rory Percy at 3:59 pm on 05/23/2017 by text page via the [facility] messaging system. Electronically Signed   By: Logan Bores M.D.   On: 05/23/2017 15:59    MRI brain/C/T/L spine 04/16/17: IMPRESSION: 1. 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 2. 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 3. No evidence of infection in the cervical or thoracic spine. 4. Diffuse cervical disc degeneration without high-grade  spinal stenosis. 5. Multilevel cervical neural foraminal stenosis, severe bilaterally at C6-7. 6. Unchanged appearance of L3-4 discitis/osteomyelitis with paravertebral phlegmon but no discrete paraspinal or  epidural abscess. Severe spinal stenosis. 7. No evidence of new infection in the lumbar spine.  Electronically Signed   By: Logan Bores M.D.   On: 04/16/2017 21:10   Procedures Procedures (including critical care time)  CRITICAL CARE-- code stroke Performed by: Reece Agar   Total critical care time: 45 minutes  Critical care time was exclusive of separately billable procedures and treating other patients.  Critical care was necessary to treat or prevent imminent or life-threatening deterioration.  Critical care was time spent personally by me on the following activities: development of treatment plan with patient and/or surrogate as well as nursing, discussions with consultants, evaluation of patient's response to treatment, examination of patient, obtaining history from patient or surrogate, ordering and performing treatments and interventions, ordering and review of laboratory studies, ordering and review of radiographic studies, pulse oximetry and re-evaluation of patient's condition.   Medications Ordered in ED Medications  potassium chloride SA (K-DUR,KLOR-CON) CR tablet 60 mEq (not administered)     Initial Impression / Assessment and Plan / ED Course  I have reviewed the triage vital signs and the nursing notes.  Pertinent labs & imaging results that were available during my care of the patient were reviewed by me and considered in my medical decision making (see chart for details).     77 y.o. male here with stroke like symptoms x2 hrs. Sister noticed that he had L eye droop, was answering questions incorrectly, and had garbled speech. He was also c/o HA. On exam, garbled speech noted, oriented to self and place (knows hospital, but thought he was in [place]), not to time; no facial droop noted; pupils pinpoint bilaterally and don't really react to light but hard to tell due to how constricted they are; finger to nose testing very slow and he has a slight tremor  while doing this, strength symmetric in extremities although weakness noted in BLEs which appears to be baseline; sensation grossly intact, distal pulses intact. Slight pronator drift on L side, although difficult to appreciate since pt doesn't fully extend hands/arms. Abdomen soft but with diffuse tenderness throughout, nonperitoneal and adequate bowel sounds. Given that he's within the stroke window, will call code stroke, and get work up; will also get acute abd series due to his abdominal pain. Will reassess shortly. Discussed case with my attending Dr. Sabra Heck who agrees with plan.   4:03 PM Dr. Rory Percy here to see pt, doesn't feel this is stroke, recommends toxic metabolic work up. CT head negative. Will await further work up and reassess shortly.   6:21 PM Delays in getting labs. Dr. Rory Percy also wanting ammonia level, which has been added on. So far we have results as follows: Chem 8 with mildly low K 3.2 and mild anemia which is chronic and stable. EKG without acute changes.  Acute abd series negative for obstruction or excessive stool retention, but does find formed stool in the descending and transverse colon, no rectal fecal impaction appreciated. Awaiting remainder of work up. Will reassess shortly.   7:40 PM CBC w/diff confirms mild chronic stable anemia, otherwise unremarkable. CMP with mildly low K 3.2, will replete now; also with hypoalbuminemia and marginally elevated alk phos, but otherwise essentially unremarkable. Lipase WNL. EtOH level undetectable. INR WNL. APTT WNL. Trop negative. Ammonia and urine studies pending. Pt now much more comprehensible, speech  much clearer, and he's oriented to time and place as well as person which is improved from earlier. I suspect this could have been either a TIA vs ?seizure; neurology also wondered whether it was a seizure and thought that admission may be warranted if this was suspected, if admitting then need to recall them. Will await urine studies and  try to get ahold of his sister, then reassess and decide on admission or not.   7:57 PM Ammonia level marginally elevated at 37 (cut off is 35). UDS pending. U/A without any evidence of UTI. Unable to get ahold of pt's sister after multiple attempts. Given his lack of metabolic derangements on labs, and no other source of his confusion, will proceed with admission for further work up. Discussed with Dr. Leonel Ramsay of neurology regarding the decision to admit, and he is on board with pt's care and will order the necessary work up on their end, and follow along with pt while he's admitted. Will proceed with admission.   8:05 PM Dr. Jonelle Sidle of [facility] returning page and will admit. Holding orders to be placed by admitting team. Please see their notes for further documentation of care. I appreciate their help with this pleasant pt's care. Pt stable at time of admission.    Final Clinical Impressions(s) / ED Diagnoses   Final diagnoses:  Altered mental status, unspecified altered mental status type  Slurred speech  Facial droop  Stroke-like symptoms  Lower abdominal pain  Constipation, unspecified constipation type  Nonintractable headache, unspecified chronicity pattern, unspecified headache type  History of CVA (cerebrovascular accident)  Chronic anemia  Hypokalemia  Hypoalbuminemia    ED Discharge Orders    [address] [place], [place] 05/23/17 Geralyn Flash, MD 05/24/17 1501

## 2017-05-24 ENCOUNTER — Observation Stay (HOSPITAL_COMMUNITY): Payer: Medicare Other

## 2017-05-24 DIAGNOSIS — F209 Schizophrenia, unspecified: Secondary | ICD-10-CM | POA: Diagnosis present

## 2017-05-24 DIAGNOSIS — Z7951 Long term (current) use of inhaled steroids: Secondary | ICD-10-CM | POA: Diagnosis not present

## 2017-05-24 DIAGNOSIS — M4646 Discitis, unspecified, lumbar region: Secondary | ICD-10-CM | POA: Diagnosis present

## 2017-05-24 DIAGNOSIS — J449 Chronic obstructive pulmonary disease, unspecified: Secondary | ICD-10-CM | POA: Diagnosis present

## 2017-05-24 DIAGNOSIS — R29703 NIHSS score 3: Secondary | ICD-10-CM | POA: Diagnosis present

## 2017-05-24 DIAGNOSIS — I252 Old myocardial infarction: Secondary | ICD-10-CM | POA: Diagnosis not present

## 2017-05-24 DIAGNOSIS — Z8673 Personal history of transient ischemic attack (TIA), and cerebral infarction without residual deficits: Secondary | ICD-10-CM | POA: Diagnosis not present

## 2017-05-24 DIAGNOSIS — N400 Enlarged prostate without lower urinary tract symptoms: Secondary | ICD-10-CM | POA: Diagnosis present

## 2017-05-24 DIAGNOSIS — F329 Major depressive disorder, single episode, unspecified: Secondary | ICD-10-CM | POA: Diagnosis present

## 2017-05-24 DIAGNOSIS — R4182 Altered mental status, unspecified: Secondary | ICD-10-CM

## 2017-05-24 DIAGNOSIS — I10 Essential (primary) hypertension: Secondary | ICD-10-CM

## 2017-05-24 DIAGNOSIS — Z87891 Personal history of nicotine dependence: Secondary | ICD-10-CM | POA: Diagnosis not present

## 2017-05-24 DIAGNOSIS — G92 Toxic encephalopathy: Secondary | ICD-10-CM | POA: Diagnosis present

## 2017-05-24 DIAGNOSIS — D649 Anemia, unspecified: Secondary | ICD-10-CM | POA: Diagnosis not present

## 2017-05-24 DIAGNOSIS — K5900 Constipation, unspecified: Secondary | ICD-10-CM | POA: Diagnosis present

## 2017-05-24 DIAGNOSIS — E8809 Other disorders of plasma-protein metabolism, not elsewhere classified: Secondary | ICD-10-CM | POA: Diagnosis present

## 2017-05-24 DIAGNOSIS — B957 Other staphylococcus as the cause of diseases classified elsewhere: Secondary | ICD-10-CM | POA: Diagnosis present

## 2017-05-24 DIAGNOSIS — D638 Anemia in other chronic diseases classified elsewhere: Secondary | ICD-10-CM | POA: Diagnosis present

## 2017-05-24 DIAGNOSIS — M889 Osteitis deformans of unspecified bone: Secondary | ICD-10-CM | POA: Diagnosis present

## 2017-05-24 DIAGNOSIS — K219 Gastro-esophageal reflux disease without esophagitis: Secondary | ICD-10-CM | POA: Diagnosis present

## 2017-05-24 DIAGNOSIS — E785 Hyperlipidemia, unspecified: Secondary | ICD-10-CM | POA: Diagnosis present

## 2017-05-24 DIAGNOSIS — E876 Hypokalemia: Secondary | ICD-10-CM | POA: Diagnosis present

## 2017-05-24 DIAGNOSIS — E7800 Pure hypercholesterolemia, unspecified: Secondary | ICD-10-CM | POA: Diagnosis present

## 2017-05-24 DIAGNOSIS — Z79899 Other long term (current) drug therapy: Secondary | ICD-10-CM | POA: Diagnosis not present

## 2017-05-24 DIAGNOSIS — F0390 Unspecified dementia without behavioral disturbance: Secondary | ICD-10-CM | POA: Diagnosis present

## 2017-05-24 DIAGNOSIS — Z7902 Long term (current) use of antithrombotics/antiplatelets: Secondary | ICD-10-CM | POA: Diagnosis not present

## 2017-05-24 LAB — LIPID PANEL
Cholesterol: 144 mg/dL (ref 0–200)
HDL: 60 mg/dL (ref >40)
LDL CALC: 75 mg/dL (ref 0–99)
Total CHOL/HDL Ratio: 2.4 RATIO
Triglycerides: 47 mg/dL (ref <150)
VLDL: 9 mg/dL (ref 0–40)

## 2017-05-24 LAB — CBC
HCT: 36.2 % — ABNORMAL LOW (ref 39.0–52.0)
HEMATOCRIT: 37.4 % — AB (ref 39.0–52.0)
HEMOGLOBIN: 11.7 g/dL — AB (ref 13.0–17.0)
Hemoglobin: 11.9 g/dL — ABNORMAL LOW (ref 13.0–17.0)
MCH: 28.1 pg (ref 26.0–34.0)
MCH: 29 pg (ref 26.0–34.0)
MCHC: 31.3 g/dL (ref 30.0–36.0)
MCHC: 32.9 g/dL (ref 30.0–36.0)
MCV: 89.7 fL (ref 78.0–100.0)
MCV: 88.3 fL (ref 78.0–100.0)
Platelets: 16 10*3/uL — CL (ref 150–400)
Platelets: 195 10*3/uL (ref 150–400)
RBC: 4.17 MIL/uL — ABNORMAL LOW (ref 4.22–5.81)
RBC: 4.1 MIL/uL — ABNORMAL LOW (ref 4.22–5.81)
RDW: 15 % (ref 11.5–15.5)
RDW: 15 % (ref 11.5–15.5)
WBC: 3.7 10*3/uL — AB (ref 4.0–10.5)
WBC: 6.1 10*3/uL (ref 4.0–10.5)

## 2017-05-24 LAB — CREATININE, SERUM: CREATININE: 0.89 mg/dL (ref 0.61–1.24)

## 2017-05-24 LAB — HEMOGLOBIN A1C
HEMOGLOBIN A1C: 5.3 % (ref 4.8–5.6)
Mean Plasma Glucose: 105.41 mg/dL

## 2017-05-24 MED ORDER — FOLIC ACID 1 MG PO TABS
1.0000 mg | ORAL_TABLET | Freq: Every day | ORAL | Status: DC
  Administered 2017-05-24 – 2017-05-25 (×2): 1 mg via ORAL
  Filled 2017-05-24 (×2): qty 1

## 2017-05-24 MED ORDER — HEPARIN SODIUM (PORCINE) 5000 UNIT/ML IJ SOLN
5000.0000 [IU] | Freq: Three times a day (TID) | INTRAMUSCULAR | Status: DC
  Administered 2017-05-24 – 2017-05-25 (×4): 5000 [IU] via SUBCUTANEOUS
  Filled 2017-05-24 (×3): qty 1

## 2017-05-24 MED ORDER — STROKE: EARLY STAGES OF RECOVERY BOOK
Freq: Once | Status: DC
  Filled 2017-05-24: qty 1

## 2017-05-24 MED ORDER — SODIUM CHLORIDE 0.9% FLUSH: 10.0000 mL | INTRAVENOUS | Status: DC | PRN

## 2017-05-24 MED ORDER — ORAL CARE MOUTH RINSE
15.0000 mL | Freq: Every day | OROMUCOSAL | Status: DC
  Administered 2017-05-25: 15 mL via OROMUCOSAL

## 2017-05-24 MED ORDER — ACETAMINOPHEN 325 MG PO TABS: 650.0000 mg | ORAL_TABLET | ORAL | Status: DC | PRN

## 2017-05-24 MED ORDER — SENNOSIDES-DOCUSATE SODIUM 8.6-50 MG PO TABS: 1.0000 | ORAL_TABLET | Freq: Every evening | ORAL | Status: DC | PRN

## 2017-05-24 MED ORDER — MELATONIN 3 MG PO TABS
3.0000 mg | ORAL_TABLET | Freq: Every day | ORAL | Status: DC
  Administered 2017-05-24 (×2): 3 mg via ORAL
  Filled 2017-05-24 (×2): qty 1

## 2017-05-24 MED ORDER — SENNA 8.6 MG PO TABS
1.0000 | ORAL_TABLET | Freq: Two times a day (BID) | ORAL | Status: DC
Start: 2017-05-24 — End: 2017-05-25
  Administered 2017-05-24 – 2017-05-25 (×4): 8.6 mg via ORAL
  Filled 2017-05-24 (×4): qty 1

## 2017-05-24 MED ORDER — FLUOXETINE HCL 20 MG PO CAPS
40.0000 mg | ORAL_CAPSULE | Freq: Every day | ORAL | Status: DC
  Administered 2017-05-24 – 2017-05-25 (×2): 40 mg via ORAL
  Filled 2017-05-24 (×2): qty 2

## 2017-05-24 MED ORDER — ALBUTEROL SULFATE HFA 108 (90 BASE) MCG/ACT IN AERS: 2.0000 | INHALATION_SPRAY | RESPIRATORY_TRACT | Status: DC | PRN

## 2017-05-24 MED ORDER — VITAMIN D 1000 UNITS PO TABS
2000.0000 [IU] | ORAL_TABLET | Freq: Every day | ORAL | Status: DC
  Administered 2017-05-24 – 2017-05-25 (×2): 2000 [IU] via ORAL
  Filled 2017-05-24 (×2): qty 2

## 2017-05-24 MED ORDER — SODIUM CHLORIDE 0.9% FLUSH
10.0000 mL | Freq: Two times a day (BID) | INTRAVENOUS | Status: DC
  Administered 2017-05-24 – 2017-05-25 (×2): 10 mL

## 2017-05-24 MED ORDER — ACETAMINOPHEN 325 MG PO TABS: 650.0000 mg | ORAL_TABLET | Freq: Four times a day (QID) | ORAL | Status: DC | PRN

## 2017-05-24 MED ORDER — CHLORHEXIDINE GLUCONATE CLOTH 2 % EX PADS
6.0000 | MEDICATED_PAD | Freq: Every day | CUTANEOUS | Status: DC
  Administered 2017-05-25: 6 via TOPICAL

## 2017-05-24 MED ORDER — FLUTICASONE PROPIONATE 50 MCG/ACT NA SUSP: 2.0000 | Freq: Every evening | NASAL | Status: DC | PRN

## 2017-05-24 MED ORDER — SODIUM CHLORIDE 0.9 % IV SOLN
INTRAVENOUS | Status: DC
  Administered 2017-05-24 – 2017-05-25 (×2): via INTRAVENOUS

## 2017-05-24 MED ORDER — PRAVASTATIN SODIUM 40 MG PO TABS
40.0000 mg | ORAL_TABLET | Freq: Every day | ORAL | Status: DC
  Administered 2017-05-24 (×2): 40 mg via ORAL
  Filled 2017-05-24 (×2): qty 1

## 2017-05-24 MED ORDER — PALIPERIDONE PALMITATE 546 MG/1.75ML IM SUSP: 546.0000 mg | INTRAMUSCULAR | Status: DC

## 2017-05-24 MED ORDER — AMLODIPINE BESYLATE 10 MG PO TABS
10.0000 mg | ORAL_TABLET | Freq: Every day | ORAL | Status: DC
  Administered 2017-05-24 – 2017-05-25 (×2): 10 mg via ORAL
  Filled 2017-05-24: qty 1
  Filled 2017-05-24: qty 2

## 2017-05-24 MED ORDER — ENSURE ENLIVE PO LIQD
237.0000 mL | Freq: Three times a day (TID) | ORAL | Status: DC
  Administered 2017-05-24 – 2017-05-25 (×3): 237 mL via ORAL

## 2017-05-24 MED ORDER — COLLAGENASE 250 UNIT/GM EX OINT
TOPICAL_OINTMENT | Freq: Every day | CUTANEOUS | Status: DC
  Administered 2017-05-25 (×2): via TOPICAL
  Filled 2017-05-24: qty 30

## 2017-05-24 MED ORDER — POLYETHYLENE GLYCOL 3350 17 G PO PACK
17.0000 g | PACK | Freq: Every day | ORAL | Status: DC
  Administered 2017-05-24 – 2017-05-25 (×2): 17 g via ORAL
  Filled 2017-05-24 (×2): qty 1

## 2017-05-24 MED ORDER — DOXAZOSIN MESYLATE 8 MG PO TABS
8.0000 mg | ORAL_TABLET | Freq: Every day | ORAL | Status: DC
  Administered 2017-05-24 (×2): 8 mg via ORAL
  Filled 2017-05-24 (×2): qty 1

## 2017-05-24 MED ORDER — ACETAMINOPHEN 160 MG/5ML PO SOLN: 650.0000 mg | ORAL | Status: DC | PRN

## 2017-05-24 MED ORDER — FINASTERIDE 5 MG PO TABS
5.0000 mg | ORAL_TABLET | Freq: Every day | ORAL | Status: DC
  Administered 2017-05-24 – 2017-05-25 (×2): 5 mg via ORAL
  Filled 2017-05-24 (×3): qty 1

## 2017-05-24 MED ORDER — LOSARTAN POTASSIUM 50 MG PO TABS
75.0000 mg | ORAL_TABLET | Freq: Every day | ORAL | Status: DC
  Administered 2017-05-24 – 2017-05-25 (×2): 75 mg via ORAL
  Filled 2017-05-24 (×2): qty 2

## 2017-05-24 MED ORDER — CEFTRIAXONE IV (FOR PTA / DISCHARGE USE ONLY): 2.0000 g | Freq: Two times a day (BID) | INTRAVENOUS | Status: DC

## 2017-05-24 MED ORDER — DEXTROSE 5 % IV SOLN
2.0000 g | Freq: Two times a day (BID) | INTRAVENOUS | Status: DC
  Administered 2017-05-24 – 2017-05-25 (×4): 2 g via INTRAVENOUS
  Filled 2017-05-24 (×4): qty 2

## 2017-05-24 MED ORDER — MIRABEGRON ER 25 MG PO TB24
25.0000 mg | ORAL_TABLET | Freq: Every day | ORAL | Status: DC
  Administered 2017-05-24 – 2017-05-25 (×2): 25 mg via ORAL
  Filled 2017-05-24 (×3): qty 1

## 2017-05-24 MED ORDER — ACETAMINOPHEN 650 MG RE SUPP: 650.0000 mg | RECTAL | Status: DC | PRN

## 2017-05-24 MED ORDER — MIRTAZAPINE 15 MG PO TABS
30.0000 mg | ORAL_TABLET | Freq: Every day | ORAL | Status: DC
  Administered 2017-05-24 (×2): 30 mg via ORAL
  Filled 2017-05-24 (×2): qty 2

## 2017-05-24 MED ORDER — CLOPIDOGREL BISULFATE 75 MG PO TABS
75.0000 mg | ORAL_TABLET | Freq: Every day | ORAL | Status: DC
  Administered 2017-05-24 – 2017-05-25 (×2): 75 mg via ORAL
  Filled 2017-05-24 (×2): qty 1

## 2017-05-24 NOTE — ED Notes (Signed)
Patient transported to Ultrasound 

## 2017-05-24 NOTE — Evaluation (Signed)
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 Symptoms improved while patient was in the ED, head CT was negative, he was admitted for TIA workup. No TPA was given, modified Rankin score 3. PMH dementia, schizophrenia, CVA 12/18, staph infection of spine, hx falls, h/o urinary retention, HTN, Pagets disease affecting R femur/pelvis, SOB/dyspnea, hx back surgery, hx pelvic fracture surgery, hx THA, hx IR lumbar disc aspiration   Clinical Impression    Patient received in bed, easily woken but lethargic through session; note he provides PLOF/equipment/level of assistance answers that are consistent with history noted in chart from previous hospitalization, however family not available to consult. He requires ModA for bed mobility today, and demonstrates significant unsteadiness sitting EOB with considerable posterior-right lean, requires constant min guard-MinA to maintain upright sitting position at edge of bed. Did not attempt functional transfers or gait today due to safety concerns with assist of only +1 available at evaluation. He was left in the bed with bed alarm activated and all needs met this afternoon. Based on functional deficits identified during this evaluation, he will likely benefit from skilled short term rehab moving forward; PT plan to follow closely for changes in functional status moving forward.     Follow Up Recommendations SNF(if patient/family refuse SNF, they will require HHPT and home aide )    Equipment Recommendations  3in1 (PT)(bedside commode, shower bench )    Recommendations for Other Services       Precautions / Restrictions Precautions Precautions: Fall Restrictions Weight Bearing Restrictions: No      Mobility  Bed Mobility Overal bed  mobility: Needs Assistance Bed Mobility: Supine to Sit;Sit to Supine     Supine to sit: Mod assist Sit to supine: Mod assist   General bed mobility comments: mod assist to bring legs around, bring trunk up at EOB; patient with very poor balance sitting at EOB with posterior-right lean requiring constant min guard to MinA to maintain upright sitting   Transfers                 General transfer comment: DNT due to poor postural control at evaluation, safety concerns   Ambulation/Gait             General Gait Details: DNT due to safety concerns at evaluation   Stairs            Wheelchair Mobility    Modified Rankin (Stroke Patients Only)       Balance Overall balance assessment: Needs assistance;History of Falls Sitting-balance support: Bilateral upper extremity supported;Feet supported Sitting balance-Leahy Scale: Poor Sitting balance - Comments: tendency for posterior-right lean in sitting requiring constant min guard to MinA; Mod assist to place hands in supportive position beside patient on bed  Postural control: Posterior lean;Right lateral lean     Standing balance comment: unable to test standing balance due to poor seated balance/safety concerns of standing with just +1 assistance available at eval                              Pertinent Vitals/Pain Pain Assessment: Faces Pain Score: 0-No pain Faces Pain Scale: No hurt Pain Intervention(s): Limited activity within patient's tolerance;Monitored during session    Home Living Family/patient expects to be discharged  to:: Private residence Living Arrangements: Other relatives(sister ) Available Help at Discharge: Family;Available 24 hours/day Type of Home: House Home Access: Stairs to enter Entrance Stairs-Rails: Right Entrance Stairs-Number of Steps: 8 Home Layout: Two level;Bed/bath upstairs Home Equipment: Walker - 2 wheels;Cane - single point;Shower seat Additional Comments: goes  to [facility] 4 days [facility]; patient poor historian, all PLOF/equipment information taken from previous note     Prior Function Level of Independence: Needs assistance   Gait / Transfers Assistance Needed: prior notes state patient has been using cane and RW, WC in community   ADL's / Homemaking Assistance Needed: sister assists with care.         Hand Dominance   Dominant Hand: Right    Extremity/Trunk Assessment   Upper Extremity Assessment Upper Extremity Assessment: Defer to OT evaluation    Lower Extremity Assessment Lower Extremity Assessment: Generalized weakness    Cervical / Trunk Assessment Cervical / Trunk Assessment: Kyphotic  Communication   Communication: No difficulties  Cognition Arousal/Alertness: Lethargic Behavior During Therapy: Flat affect Overall Cognitive Status: History of cognitive impairments - at baseline                                 General Comments: history of dementia at baseline, patient lethargic today, no family available to assist in determining/comparing to baseline       General Comments General comments (skin integrity, edema, etc.): patient lethargic today, but able to provide history consistent with that previously charted and able to follow PT commands/participate in session; family not available to assist in determining/comparing to baseline     Exercises     Assessment/Plan    PT Assessment Patient needs continued PT services  PT Problem List Decreased strength;Decreased mobility;Decreased safety awareness;Decreased coordination;Decreased balance;Decreased knowledge of use of DME       PT Treatment Interventions DME instruction;Therapeutic activities;Gait training;Therapeutic exercise;Patient/family education;Stair training;Balance training;Functional mobility training;Neuromuscular re-education    PT Goals (Current goals can be found in the Care Plan section)  Acute Rehab PT Goals PT Goal Formulation:  Patient unable to participate in goal setting Time For Goal Achievement: 06/07/17 Potential to Achieve Goals: Fair    Frequency Min 3X/week   Barriers to discharge        Co-evaluation               AM-PAC PT "6 Clicks" Daily Activity  Outcome Measure Difficulty turning over in bed (including adjusting bedclothes, sheets and blankets)?: Unable Difficulty moving from lying on back to sitting on the side of the bed? : Unable Difficulty sitting down on and standing up from a chair with arms (e.g., wheelchair, bedside commode, etc,.)?: Unable Help needed moving to and from a bed to chair (including a wheelchair)?: Total Help needed walking in hospital room?: Total Help needed climbing 3-5 steps with a railing? : Total 6 Click Score: 6    End of Session   Activity Tolerance: Patient limited by lethargy Patient left: in bed;with bed alarm set;with call bell/phone within reach   PT Visit Diagnosis: Unsteadiness on feet (R26.81);Muscle weakness (generalized) (M62.81);History of falling (Z91.81);Difficulty in walking, not elsewhere classified (R26.2)    Time: 7591-6384 PT Time Calculation (min) (ACUTE ONLY): 18 min   Charges:   PT Evaluation $PT Eval Moderate Complexity: 1 Mod     PT G Codes:        Deniece Ree PT, DPT, CBIS  Supplemental Physical Therapist  [place]   Pager [phone]

## 2017-05-24 NOTE — Care Management Obs Status (Signed)
MEDICARE OBSERVATION STATUS NOTIFICATION   Patient Details  Name: Norman Davis MRN: [id number] Date of Birth: Apr 19, 1941   Medicare Observation Status Notification Given:  Yes    Lawerance SabalDebbie Jaggar Benko, RN 05/24/2017, 4:48 PM

## 2017-05-24 NOTE — ED Notes (Signed)
Dr. Krishnan, admitting, at bedside. 

## 2017-05-24 NOTE — ED Notes (Signed)
Breakfast tray ordered at 0325 

## 2017-05-24 NOTE — Progress Notes (Signed)
[facility] PROGRESS NOTE  Norman Davis ZOX:[id number] DOB: 10-10-1940 DOA: 05/23/2017  PCP: Burton Apley, MD  Brief History/Interval Summary: 77 year old African-American with past medical history of dementia, schizophrenia, stroke December, acute discitis for which he is on IV antibiotics presented with complains of being lethargic as well as some speech impairment.  There was concern for TIA versus stroke versus seizure.  Patient was hospitalized.  Reason for Visit: Altered mental status  Consultants: Neurology  Procedures:  EEG is pending  Carotid Doppler Findings suggest 1-39% internal carotid artery stenosis bilaterally. Vertebral arteries are patent with antegrade flow.   Antibiotics: On IV ceftriaxone per his home regimen  Subjective/Interval History: Patient complains of some back pain but denies any headaches.  Back pain is not worse than usual.  No chest pain shortness of breath.  ROS: Denies any nausea or vomiting  Objective:  Vital Signs  Vitals:   05/24/17 0800 05/24/17 0900 05/24/17 1000 05/24/17 1100  BP: 139/86 (!) 141/102 (!) 129/94 (!) 137/101  Pulse: 85 82 88 93  Resp: (!) 9 (!) 9 13 14   Temp:      TempSrc:      SpO2: 99% 99% 98% 98%  Weight:      Height:        Intake/Output Summary (Last 24 hours) at 05/24/2017 1207 Last data filed at 05/24/2017 8119 Gross per 24 hour  Intake 50 ml  Output 800 ml  Net -750 ml   Filed Weights   05/23/17 1458  Weight: 73.5 kg (162 lb)    General appearance: alert, cooperative, appears stated age and no distress Head: Normocephalic, without obvious abnormality, atraumatic Resp: clear to auscultation bilaterally Cardio: regular rate and rhythm, S1, S2 normal, no murmur, click, rub or gallop GI: soft, non-tender; bowel sounds normal; no masses,  no organomegaly Extremities: extremities normal, atraumatic, no cyanosis or edema Neurologic: Slow to respond.  However no obvious facial asymmetry.   Moving all his extremities.  No obvious deficits noted.  Lab Results:  Data Reviewed: I have personally reviewed following labs and imaging studies  CBC: Recent Labs  Lab 05/23/17 1800 05/23/17 1813 05/24/17 0014 05/24/17 0546  WBC 5.4  --  3.7* 6.1  NEUTROABS 3.7  --   --   --   HGB 11.1* 11.9* 11.7* 11.9*  HCT 34.5* 35.0* 37.4* 36.2*  MCV 88.9  --  89.7 88.3  PLT 199  --  16* 195    Basic Metabolic Panel: Recent Labs  Lab 05/23/17 1800 05/23/17 1813 05/24/17 0014  NA 141 143  --   K 3.2* 3.2*  --   CL 105 103  --   CO2 25  --   --   GLUCOSE 93 90  --   BUN 5* 4*  --   CREATININE 0.82 0.70 0.89  CALCIUM 9.7  --   --     GFR: Estimated Creatinine Clearance: 68.4 mL/min (by C-G formula based on SCr of 0.89 mg/dL).  Liver Function Tests: Recent Labs  Lab 05/23/17 1800  AST 17  ALT 10*  ALKPHOS 127*  BILITOT 0.7  PROT 6.5  ALBUMIN 2.9*    Recent Labs  Lab 05/23/17 1800  LIPASE 26   Recent Labs  Lab 05/23/17 1926  AMMONIA 37*    Coagulation Profile: Recent Labs  Lab 05/23/17 1800  INR 1.18    HbA1C: Recent Labs    05/24/17 0307  HGBA1C 5.3    Lipid Profile: Recent Labs  05/24/17 0307  CHOL 144  HDL 60  LDLCALC 75  TRIG 47  CHOLHDL 2.4     Radiology Studies: Mr Brain Wo Contrast  Result Date: 05/24/2017 CLINICAL DATA:  Initial evaluation for acute altered mental status, slurred speech. EXAM: MRI HEAD WITHOUT CONTRAST MRA HEAD WITHOUT CONTRAST TECHNIQUE: Multiplanar, multiecho pulse sequences of the brain and surrounding structures were obtained without intravenous contrast. Angiographic images of the head were obtained using MRA technique without contrast. COMPARISON:  Prior CT from earlier the same day. FINDINGS: MRI HEAD FINDINGS Brain: Study moderately degraded by motion artifact. Moderate temporal lobe predominant cerebral atrophy. Confluent T2/FLAIR hyperintensity within the periventricular and deep white matter both  cerebral hemispheres, most consistent with chronic microvascular ischemic disease, moderate to advanced in nature. Chronic microvascular changes present within the pons as well. Scattered superimposed remote lacunar infarcts present within the bilateral thalami/corona radiata. Remote lacunar infarct noted within the mid right centrum semi ovale. Scattered tiny remote lacunar infarcts present within the bilateral cerebellar hemispheres. No abnormal foci of restricted diffusion to suggest acute or subacute ischemia. Gray-white matter differentiation maintained. No evidence for acute or chronic intracranial hemorrhage. No mass lesion, midline shift or mass effect. Diffuse ventricular prominence related to global parenchymal volume loss without hydrocephalus. No extra-axial fluid collection. Major dural sinuses are grossly patent. Vascular: Major intracranial vascular flow voids are maintained. Dolichoectasia of the intracranial circulation noted. Skull and upper cervical spine: Craniocervical junction not well evaluated due to motion artifact. Bone marrow signal intensity within normal limits. No scalp soft tissue abnormality. Sinuses/Orbits: Globes and orbital soft tissues demonstrate no acute abnormality. Patient status post cataract extraction on the left. Left fronto ethmoidal sinusitis noted. Paranasal sinuses are otherwise clear. Right mastoid effusion noted. Inner ear structures grossly normal. Other: None. MRA HEAD FINDINGS ANTERIOR CIRCULATION: Examination technically limited by motion artifact. Distal cervical segments of the internal carotid arteries are tortuous but widely patent with antegrade flow. Internal carotid arteries widely patent to the termini without flow-limiting stenosis. A1 segments widely patent. Normal anterior communicating artery. Anterior cerebral arteries patent to their distal aspects without flow-limiting stenosis. Proximal and mid aspects of the M1 segments are widely patent  bilaterally. Distal M1 segments and MCA bifurcations not well evaluated due to motion. Distal MCA branches are well perfused and fairly symmetric. POSTERIOR CIRCULATION: Vertebral arteries somewhat ectatic and tortuous but widely patent to the vertebrobasilar junction. Right vertebral artery slightly dominant. Partially visualized posterior inferior cerebral arteries patent bilaterally. Basilar tortuous but widely patent to its distal aspect without stenosis. Superior cerebral arteries patent proximally. Both of the posterior cerebral artery supplied via the basilar and are widely patent proximally, not well evaluated distally due to motion. No aneurysm. IMPRESSION: MRI HEAD IMPRESSION: 1. No acute intracranial infarct or other process identified. 2. Moderately advanced chronic microvascular ischemic disease with scattered remote lacunar infarcts involving the bilateral thalami and corona radiata, as well as the bilateral cerebral hemispheres. 3. Moderately advanced cerebral atrophy. 4. Chronic left fronto ethmoidal sinusitis. MRA HEAD IMPRESSION: 1. Negative intracranial MRA for large vessel occlusion. No proximal high-grade or correctable stenosis. 2. Dolichoectasia of the intracranial circulation. Electronically Signed   By: Rise Mu M.D.   On: 05/24/2017 00:38   Dg Abd Acute W/chest  Result Date: 05/23/2017 CLINICAL DATA:  Constipation.  Evaluate for obstruction. EXAM: DG ABDOMEN ACUTE W/ 1V CHEST COMPARISON:  04/19/2017 FINDINGS: Formed stool mainly in the descending and transverse colon. Bowel gas is less extensive than on prior. No rectal  impaction. No concerning mass effect or gas collection. IVC filter with stable rotation. Remote lumbosacral fusion and left hemipelvis repair. Severe L3-4/L4-5 disc disease as seen on recent MRI. 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 Normal heart size. Mild aortic tortuosity. Emphysematous changes greater on the right. Right upper  extremity PICC with tip at the SVC. There is no edema, consolidation, effusion, or pneumothorax. IMPRESSION: 1. Negative for bowel obstruction or excessive stool retention. 2. Emphysema.  No acute finding in the chest. Electronically Signed   By: Marnee Spring M.D.   On: 05/23/2017 16:22   Mr Maxine Glenn Head Wo Contrast  Result Date: 05/24/2017 CLINICAL DATA:  Initial evaluation for acute altered mental status, slurred speech. EXAM: MRI HEAD WITHOUT CONTRAST MRA HEAD WITHOUT CONTRAST TECHNIQUE: Multiplanar, multiecho pulse sequences of the brain and surrounding structures were obtained without intravenous contrast. Angiographic images of the head were obtained using MRA technique without contrast. COMPARISON:  Prior CT from earlier the same day. FINDINGS: MRI HEAD FINDINGS Brain: Study moderately degraded by motion artifact. Moderate temporal lobe predominant cerebral atrophy. Confluent T2/FLAIR hyperintensity within the periventricular and deep white matter both cerebral hemispheres, most consistent with chronic microvascular ischemic disease, moderate to advanced in nature. Chronic microvascular changes present within the pons as well. Scattered superimposed remote lacunar infarcts present within the bilateral thalami/corona radiata. Remote lacunar infarct noted within the mid right centrum semi ovale. Scattered tiny remote lacunar infarcts present within the bilateral cerebellar hemispheres. No abnormal foci of restricted diffusion to suggest acute or subacute ischemia. Gray-white matter differentiation maintained. No evidence for acute or chronic intracranial hemorrhage. No mass lesion, midline shift or mass effect. Diffuse ventricular prominence related to global parenchymal volume loss without hydrocephalus. No extra-axial fluid collection. Major dural sinuses are grossly patent. Vascular: Major intracranial vascular flow voids are maintained. Dolichoectasia of the intracranial circulation noted. Skull and  upper cervical spine: Craniocervical junction not well evaluated due to motion artifact. Bone marrow signal intensity within normal limits. No scalp soft tissue abnormality. Sinuses/Orbits: Globes and orbital soft tissues demonstrate no acute abnormality. Patient status post cataract extraction on the left. Left fronto ethmoidal sinusitis noted. Paranasal sinuses are otherwise clear. Right mastoid effusion noted. Inner ear structures grossly normal. Other: None. MRA HEAD FINDINGS ANTERIOR CIRCULATION: Examination technically limited by motion artifact. Distal cervical segments of the internal carotid arteries are tortuous but widely patent with antegrade flow. Internal carotid arteries widely patent to the termini without flow-limiting stenosis. A1 segments widely patent. Normal anterior communicating artery. Anterior cerebral arteries patent to their distal aspects without flow-limiting stenosis. Proximal and mid aspects of the M1 segments are widely patent bilaterally. Distal M1 segments and MCA bifurcations not well evaluated due to motion. Distal MCA branches are well perfused and fairly symmetric. POSTERIOR CIRCULATION: Vertebral arteries somewhat ectatic and tortuous but widely patent to the vertebrobasilar junction. Right vertebral artery slightly dominant. Partially visualized posterior inferior cerebral arteries patent bilaterally. Basilar tortuous but widely patent to its distal aspect without stenosis. Superior cerebral arteries patent proximally. Both of the posterior cerebral artery supplied via the basilar and are widely patent proximally, not well evaluated distally due to motion. No aneurysm. IMPRESSION: MRI HEAD IMPRESSION: 1. No acute intracranial infarct or other process identified. 2. Moderately advanced chronic microvascular ischemic disease with scattered remote lacunar infarcts involving the bilateral thalami and corona radiata, as well as the bilateral cerebral hemispheres. 3. Moderately  advanced cerebral atrophy. 4. Chronic left fronto ethmoidal sinusitis. MRA HEAD IMPRESSION: 1.  Negative intracranial MRA for large vessel occlusion. No proximal high-grade or correctable stenosis. 2. Dolichoectasia of the intracranial circulation. Electronically Signed   By: Rise Mu M.D.   On: 05/24/2017 00:38   Ct Head Code Stroke Wo Contrast  Result Date: 05/23/2017 CLINICAL DATA:  Code stroke. Left-sided weakness. Speech disturbance. Lethargy. History of stroke. EXAM: CT HEAD WITHOUT CONTRAST TECHNIQUE: Contiguous axial images were obtained from the base of the skull through the vertex without intravenous contrast. COMPARISON:  Head CT 04/12/2017 and MRI 04/16/2017 FINDINGS: Brain: There is no evidence of acute infarct, intracranial hemorrhage, mass, midline shift, or extra-axial fluid collection. Chronic lacunar infarcts are again noted in the basal ganglia, thalami, and posterior right frontal subcortical white matter. Patchy to confluent hypodensities elsewhere in the cerebral white matter bilaterally are similar to the prior CT and nonspecific but compatible with moderate chronic small vessel ischemic disease. There is moderate cerebral atrophy. Vascular: Calcified atherosclerosis at the skull base. No hyperdense vessel. Skull: No fracture or suspicious osseous lesion. Sinuses/Orbits: Increased left frontal sinus and left ethmoid air cell opacification with a fluid level noted in the frontal sinus. Persistent right mastoid effusion. Left cataract extraction. Other: None. ASPECTS [facility] Stroke Program Early CT Score) - Ganglionic level infarction (caudate, lentiform nuclei, internal capsule, insula, M1-M3 cortex): 7 - Supraganglionic infarction (M4-M6 cortex): 3 Total score (0-10 with 10 being normal): 10 IMPRESSION: 1. No evidence of acute intracranial abnormality. 2. ASPECTS is 10. 3. Chronic ischemia as above. These results were communicated to Dr. Wilford Corner at 3:59 pm on 05/23/2017 by text  page via the [facility] messaging system. Electronically Signed   By: Sebastian Ache M.D.   On: 05/23/2017 15:59     Medications:  Scheduled: .  stroke: mapping our early stages of recovery book   Does not apply Once  . amLODipine  10 mg Oral Daily  . Chlorhexidine Gluconate Cloth  6 each Topical Daily  . cholecalciferol  2,000 Units Oral Daily  . clopidogrel  75 mg Oral Q breakfast  . doxazosin  8 mg Oral QHS  . feeding supplement (ENSURE ENLIVE)  237 mL Oral TID BM  . finasteride  5 mg Oral Daily  . FLUoxetine  40 mg Oral Daily  . folic acid  1 mg Oral Daily  . heparin  5,000 Units Subcutaneous Q8H  . losartan  75 mg Oral Daily  . mouth rinse  15 mL Mouth Rinse Daily  . Melatonin  3 mg Oral QHS  . mirabegron ER  25 mg Oral Daily  . mirtazapine  30 mg Oral QHS  . polyethylene glycol  17 g Oral Daily  . pravastatin  40 mg Oral QHS  . senna  1 tablet Oral BID   Continuous: . sodium chloride 75 mL/hr at 05/24/17 0035  . cefTRIAXone (ROCEPHIN)  IV 2 g (05/24/17 1146)   YQM:[id number] **OR** acetaminophen (TYLENOL) oral liquid 160 mg/5 mL **OR** acetaminophen, acetaminophen, albuterol, fluticasone, senna-docusate  Assessment/Plan:  Principal Problem:   Cerebrovascular accident (CVA) (HCC) Active Problems:   COPD (chronic obstructive pulmonary disease) (HCC)   Hypokalemia   Essential hypertension   Coagulase-negative staphylococcal infection   TIA (transient ischemic attack)    Altered mental status Etiology remains unclear.  MRI negative for stroke.  Not thought to be TIA per neurology.  Seems to be back to his baseline currently.  EEG has been ordered as recommended by neurology.  PT evaluation.  Continue Plavix for now.  UA did not suggest infection.  WBC is normal.  He is afebrile.  Previous history of stroke Stable.  Continue home medications including Plavix.  LDL 75.  History of discitis Patient is currently on IV ceftriaxone at home.  He has a PICC line.  He is  followed by infectious disease.  Continue home medication regimen.  History of dementia Seems to be at baseline.  History of COPD Stable.  Normocytic anemia Most likely due to anemia of chronic disease.  No evidence for overt bleeding.  DVT Prophylaxis: Subcutaneous heparin    Code Status: Full Code Family Communication: Discussed with patient.  No family at bedside.  Discussed with his sister over the phone Disposition Plan: Management as outlined above.  Await EEG, PT evaluation.  Anticipate discharge back to home when workup is complete.    LOS: 0 days   Osvaldo ShipperGokul Sharah Finnell  [facility] Pager [phone] 05/24/2017, 12:07 PM  If 7PM-7AM, please contact night-coverage at [email or web address], password [email or web address]

## 2017-05-24 NOTE — Progress Notes (Signed)
EEG Completed; Results Pending  

## 2017-05-24 NOTE — Consult Note (Signed)
WOC Nurse wound consult note Reason for Consult: coccyx pressure injury, unstageable Wound type: pressure Pressure Injury POA: Yes Measurement: 2cm x 1.2cm x 0.2cm  Wound bed: 100% pale yellow wound bed Drainage (amount, consistency, odor) moderate tan, no odor noted Periwound:macerated Dressing procedure/placement/frequency: I have provided nurses with orders for Santyl Enzymatic ointment daily to coccyx wound. Patient is already on supplements to improve his nutritional status which will help his ability for healing of wound. We will not follow, but will remain available to this patient, to nursing, and the medical and/or surgical teams.  Please re-consult if we need to assist further.   Barnett HatterMelinda Jodilyn Giese, RN-C, WTA-C, OCA Wound Treatment Associate Ostomy Care Associate

## 2017-05-24 NOTE — Procedures (Signed)
ELECTROENCEPHALOGRAM REPORT  Date of Study: 05/24/2017  Patient's Name: Norman Davis MRN: [id number] Date of Birth: [birth year]  Referring Provider: Dr. Osvaldo ShipperGokul Krishnan  Clinical History: This is a 77 year old man with altered mental status  Medications: Tylenol Norvasc Ceftriaxone Clopidogrel Cardura Proscar Prozac  Technical Summary: A multichannel digital EEG recording measured by the international 10-20 system with electrodes applied with paste and impedances below 5000 ohms performed as portable with EKG monitoring in an awake and asleep patient.  Hyperventilation and photic stimulation were not performed.  The digital EEG was referentially recorded, reformatted, and digitally filtered in a variety of bipolar and referential montages for optimal display.   Description: The patient is awake and asleep during the recording.  During maximal wakefulness, there is a symmetric, medium voltage 7 Hz posterior dominant rhythm that attenuates with eye opening. This is admixed with a moderate amount of diffuse 4-6 Hz theta and occasional 2-3 Hz delta slowing of the waking background.  During drowsiness and sleep, there is an increase in theta and delta slowing of the background with poorly formed vertex waves seen.  Hyperventilation and photic stimulation were not performed. There were no epileptiform discharges or electrographic seizures seen.    EKG lead was unremarkable.  Impression: This awake and asleep EEG is abnormal due to moderate diffuse slowing of the waking background.  Clinical Correlation of the above findings indicates diffuse cerebral dysfunction that is non-specific in etiology and can be seen with hypoxic/ischemic injury, toxic/metabolic encephalopathies, neurodegenerative disorders, or medication effect.  The absence of epileptiform discharges does not rule out a clinical diagnosis of epilepsy.  Clinical correlation is advised.   Patrcia DollyKaren Aquino, M.D.

## 2017-05-24 NOTE — Progress Notes (Signed)
Patient arrives to 3W06 from the ED. No family or belongings present. Safety precautions and orders reviewed. VSS. TELE applied and confirmed. PICC line on right upper arm x 1 lumen and stage 3 PU noted on assessment. No other distress noted. Will continue to monitor.     Sim BoastHavy, RN

## 2017-05-24 NOTE — Progress Notes (Signed)
*  PRELIMINARY RESULTS* Vascular Ultrasound Carotid Duplex (Doppler) has been completed.  Findings suggest 1-39% internal carotid artery stenosis bilaterally. Vertebral arteries are patent with antegrade flow.  05/24/2017 9:35 AM Gertie FeyMichelle Tabbatha Bordelon, BS, RVT, RDCS, RDMS

## 2017-05-24 NOTE — ED Notes (Signed)
Heart Healthy Diet has been ordered for Lunch. 

## 2017-05-25 NOTE — Progress Notes (Signed)
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 Symptoms improved while patient was in the ED, head CTand MRI was negative, he was admitted for TIA workup. No TPA was given, modified Rankin score 3. 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 He was able to complete bed mobility and functional transfers with assist levels as noted below, and continues to demonstrate difficulty with seated and standing balance as well as mobility as a whole. He was left up in the chair with all needs met, chair alarm activated this morning.    Follow Up Recommendations  SNF(if patient/family refuse SNF, they will require HHPT and home aide )     Equipment Recommendations  3in1 (PT)(bedside commode, shower bench )    Recommendations for Other Services       Precautions / Restrictions Precautions Precautions: Fall Restrictions Weight Bearing Restrictions: No    Mobility  Bed Mobility Overal bed mobility: Needs Assistance Bed Mobility: Supine to Sit     Supine to sit: Mod assist;+2 for physical assistance     General bed mobility comments: continues to require assistance to bring legs around/power trunk up and scoot forward at EOB   Transfers Overall transfer level: Needs assistance Equipment used: Rolling walker (2 wheeled) Transfers: Sit to/from [employer] Sit to Stand: Mod assist;+2  physical assistance;From elevated surface Stand pivot transfers: Mod assist;+2 physical assistance;From elevated surface       General transfer comment: for stand pivot pt need A to shift weight to right to then also have A to move LLE, moved RLE on his own (small steps)  Ambulation/Gait             General Gait Details: DNT    Stairs            Wheelchair Mobility    Modified Rankin (Stroke Patients Only)       Balance Overall balance assessment: Needs assistance;History of Falls Sitting-balance support: Bilateral upper extremity supported;Feet supported Sitting balance-Leahy Scale: Poor Sitting balance - Comments: tendency for posterior-right lean in sitting requiring constant min guard to MinA; Got him to lean forward by holding onto arms of chair in front of him--then he had a tendency to lean forward and to right   Postural control: Posterior lean;Right lateral lean Standing balance support: Bilateral upper extremity supported Standing balance-Leahy Scale: Poor Standing balance comment: reliant on hands on RW                            Cognition Arousal/Alertness: Lethargic Behavior During Therapy: Flat affect Overall Cognitive Status: History of cognitive impairments - at baseline                                 General Comments: history of dementia at baseline, patient lethargic today (but more alert than yesterday per  PT), no family available to assist in determining/comparing to baseline       Exercises      General Comments General comments (skin integrity, edema, etc.): patient more alert today however continues to require increased levels of assistance for safe mobility       Pertinent Vitals/Pain Pain Assessment: Faces Faces Pain Scale: Hurts a little bit Pain Location: low back Pain Descriptors / Indicators: Aching;Sore Pain Intervention(s): Limited activity within patient's tolerance;Monitored during  session;Repositioned    Home Living Family/patient expects to be discharged to:: Skilled nursing facility   Available Help at Discharge: Family;Available 24 hours/day Type of Home: House Home Access: Stairs to enter Entrance Stairs-Rails: Right Home Layout: Two level;Bed/bath upstairs Home Equipment: Walker - 2 wheels;Cane - single point;Shower seat Additional Comments: goes to [facility] 4 days week; patient poor historian, all PLOF/equipment information taken from previous note     Prior Function Level of Independence: Needs assistance  Gait / Transfers Assistance Needed: prior notes state patient has been using cane and RW, WC in community  ADL's / Homemaking Assistance Needed: sister assists with care.      PT Goals (current goals can now be found in the care plan section) Acute Rehab PT Goals Patient Stated Goal: stated OK to getting up in recliner PT Goal Formulation: Patient unable to participate in goal setting Time For Goal Achievement: 06/07/17 Potential to Achieve Goals: Fair Progress towards PT goals: Progressing toward goals    Frequency    Min 3X/week      PT Plan Current plan remains appropriate    Co-evaluation PT/OT/SLP Co-Evaluation/Treatment: Yes Reason for Co-Treatment: Complexity of the patient's impairments (multi-system involvement);For patient/therapist safety;To address functional/ADL transfers PT goals addressed during session: Mobility/safety with mobility;Balance        AM-PAC PT "6 Clicks" Daily Activity  Outcome Measure  Difficulty turning over in bed (including adjusting bedclothes, sheets and blankets)?: Unable Difficulty moving from lying on back to sitting on the side of the bed? : Unable Difficulty sitting down on and standing up from a chair with arms (e.g., wheelchair, bedside commode, etc,.)?: Unable Help needed moving to and from a bed to chair (including a wheelchair)?: A Lot Help needed walking in hospital room?:  Total Help needed climbing 3-5 steps with a railing? : Total 6 Click Score: 7    End of Session Equipment Utilized During Treatment: Gait belt Activity Tolerance: Patient tolerated treatment well Patient left: in chair;with call bell/phone within reach;with chair alarm set Nurse Communication: Mobility status;Need for lift equipment PT Visit Diagnosis: Unsteadiness on feet (R26.81);Muscle weakness (generalized) (M62.81);History of falling (Z91.81);Difficulty in walking, not elsewhere classified (R26.2)     Time: 2035-5974 PT Time Calculation (min) (ACUTE ONLY): 28 min  Charges:  $Therapeutic Activity: 8-22 mins                    G Codes:       Deniece Ree PT, DPT, CBIS  Supplemental Physical Therapist [place]   Pager [phone]

## 2017-05-25 NOTE — Evaluation (Signed)
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 Symptoms improved while patient was in the ED, head CTand MRI was negative, he was admitted for TIA workup. No TPA was given, modified Rankin score 3. 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 He will benefit from acute OT with follow up OT at SNF.    Follow Up Recommendations  SNF;Supervision/Assistance - 24 hour    Equipment Recommendations  Other (comment)(TBA at next venue)       Precautions / Restrictions Precautions Precautions: Fall Restrictions Weight Bearing Restrictions: No      Mobility Bed Mobility Overal bed mobility: Needs Assistance Bed Mobility: Supine to Sit     Supine to sit: Mod assist;+2 for physical assistance        Transfers Overall transfer level: Needs assistance Equipment used: Rolling walker (2 wheeled) Transfers: Sit to/from [employer] Sit to Stand: Mod assist;+2 physical assistance;From elevated surface Stand pivot transfers: Mod assist;+2 physical assistance;From elevated surface       General transfer comment: for stand pivot pt need A to shift weight to right to then also have A to move LLE, moved RLE on his own (small steps)    Balance  Overall balance assessment: Needs assistance;History of Falls Sitting-balance support: Bilateral upper extremity supported;Feet supported Sitting balance-Leahy Scale: Poor Sitting balance - Comments: tendency for posterior-right lean in sitting requiring constant min guard to MinA; Got him to lean forward by holding onto arms of chair in front of him--then he had a tendency to lean forward and to right   Postural control: Posterior lean;Right lateral lean Standing balance support: Bilateral upper extremity supported Standing balance-Leahy Scale: Poor Standing balance comment: reliant on hands on RW                           ADL either performed or assessed with clinical judgement   ADL Overall ADL's : Needs assistance/impaired Eating/Feeding: Moderate assistance;Bed level;Sitting   Grooming: Maximal assistance;Bed level;Sitting Grooming Details (indicate cue type and reason): able to wash part of his face while seated EOB Upper Body Bathing: Maximal assistance;Bed level;Sitting   Lower Body Bathing: Total assistance Lower Body Bathing Details (indicate cue type and reason): Mod A+2 sit<>stand from raised bed Upper Body Dressing : Total assistance;Sitting   Lower Body Dressing: Total assistance Lower Body Dressing Details (indicate cue type and reason): Mod A+2 sit<>stand from raised bed Toilet Transfer: Moderate assistance;+2 for physical assistance;Stand-pivot Toilet Transfer Details (indicate cue type and reason): bed>recliner going to pt's left with A to move LLE physically and to weight shift to right to help with LLE Toileting- Clothing Manipulation and Hygiene: Total assistance Toileting - Clothing Manipulation Details (indicate cue type and reason): Mod A+2 sit<>stand from raised bed       General ADL Comments: All activities increased  time to slowness of movement, decreased initiation and speed once starts to move     Vision Patient Visual Report: No change from  baseline              Pertinent Vitals/Pain Pain Assessment: Faces Faces Pain Scale: Hurts a little bit Pain Location: low back Pain Descriptors / Indicators: Aching;Sore Pain Intervention(s): Limited activity within patient's tolerance;Monitored during session     Hand Dominance Right   Extremity/Trunk Assessment Upper Extremity Assessment Upper Extremity Assessment: RUE deficits/detail;LUE deficits/detail RUE Deficits / Details: Better with spontaneous/functional movement than just asking him to move his arm; still needs A for all tasks RUE Coordination: decreased fine motor;decreased gross motor LUE Deficits / Details: Better with spontaneous/functional movement than just asking him to move his arm (but moves this one better than RUE when asked), still needs A for all tasks LUE Coordination: decreased fine motor;decreased gross motor           Communication Communication Communication: No difficulties   Cognition Arousal/Alertness: Lethargic Behavior During Therapy: Flat affect Overall Cognitive Status: History of cognitive impairments - at baseline                                 General Comments: history of dementia at baseline, patient lethargic today (but more alert than yesterday per PT), no family available to assist in determining/comparing to baseline               Home Living Family/patient expects to be discharged to:: Skilled nursing facility   Available Help at Discharge: Family;Available 24 hours/day Type of Home: House Home Access: Stairs to enter [employer] CorporationEntrance Stairs-Number of Steps: 8 Entrance Stairs-Rails: Right Home Layout: Two level;Bed/bath upstairs   Alternate Level Stairs-Rails: Right;Left;Can reach both Bathroom Shower/Tub: Chief Strategy OfficerTub/shower unit   Bathroom Toilet: Standard     Home Equipment: Environmental consultantWalker - 2 wheels;Cane - single point;Shower seat   Additional Comments: goes to [facility] 4 days week; patient poor historian, all  PLOF/equipment information taken from previous note       Prior Functioning/Environment Level of Independence: Needs assistance  Gait / Transfers Assistance Needed: prior notes state patient has been using cane and RW, WC in community  ADL's / Homemaking Assistance Needed: sister assists with care.             OT Problem List: Decreased strength;Decreased range of motion;Impaired balance (sitting and/or standing);Impaired UE functional use;Decreased cognition;Decreased safety awareness      OT Treatment/Interventions: Self-care/ADL training;Balance training;Neuromuscular education;DME and/or AE instruction;Patient/family education    OT Goals(Current goals can be found in the care plan section) Acute Rehab OT Goals Patient Stated Goal: stated OK to getting up in recliner OT Goal Formulation: With patient Time For Goal Achievement: 06/08/17 Potential to Achieve Goals: Fair  OT Frequency: Min 2X/week           Co-evaluation PT/OT/SLP Co-Evaluation/Treatment: Yes Reason for Co-Treatment: Complexity of the patient's impairments (multi-system involvement);For patient/therapist safety;To address functional/ADL transfers          AM-PAC PT "6 Clicks" Daily Activity     Outcome Measure Help from another person eating meals?: A Lot Help from another person taking care of personal grooming?: A Lot Help from another person toileting, which includes using toliet, bedpan, or urinal?: Total Help from another person bathing (including washing, rinsing, drying)?: A Lot Help from another person to put on and taking off regular upper body clothing?:  A Lot Help from another person to put on and taking off regular lower body clothing?: Total 6 Click Score: 10   End of Session Equipment Utilized During Treatment: Gait belt;Rolling walker Nurse Communication: Mobility status(NT: use sara stedy and please help pt eat)  Activity Tolerance: Patient limited by lethargy(but better than yesterday  per PT) Patient left: in chair;with call bell/phone within reach;with chair alarm set  OT Visit Diagnosis: 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

## 2017-05-25 NOTE — Discharge Summary (Signed)
[facility]  Physician Discharge Summary   Patient ID: Norman Davis MRN: [id number] DOB/AGE: 05/07/1940 77 y.o.  Admit date: 05/23/2017 Discharge date: 05/25/2017  PCP: Henreitta Cea, MD  DISCHARGE DIAGNOSES:  Principal Problem:   Cerebrovascular accident (CVA) ([place]) Active Problems:   COPD (chronic obstructive pulmonary disease) ([place])   Hypokalemia   Essential hypertension   Coagulase-negative staphylococcal infection   TIA (transient ischemic attack)   Altered mental status   Altered mental state   RECOMMENDATIONS FOR OUTPATIENT FOLLOW UP: 1. Home health to be resumed. 2. Continue with home infusion of ceftriaxone as previously ordered.  DISCHARGE CONDITION: fair  Diet recommendation: As before  Filed Weights   05/23/17 1458  Weight: 73.5 kg (162 lb)    INITIAL HISTORY: 77 year old African-American with past medical history of dementia, schizophrenia, stroke December, acute discitis for which he is on IV antibiotics presented with complains of being lethargic as well as some speech impairment.  There was concern for TIA versus stroke versus seizure.  Patient was hospitalized.  Procedures:  EEG Impression: This awake and asleep EEG is abnormal due to moderate diffuse slowing of the waking background.  Clinical Correlation of the above findings indicates diffuse cerebral dysfunction that is non-specific in etiology and can be seen with hypoxic/ischemic injury, toxic/metabolic encephalopathies, neurodegenerative disorders, or medication effect.  The absence of epileptiform discharges does not rule out a clinical diagnosis of epilepsy.  Clinical correlation is advised.  Carotid Doppler Findings suggest 1-39% internal carotid artery stenosis bilaterally. Vertebral arteries are patent with antegrade flow.   HOSPITAL COURSE:   Altered mental status Etiology remains unclear but could be related to underlying dementia.  MRI negative for stroke.  MRI did show  chronic ischemic changes and cerebral atrophy. Not thought to be TIA per neurology.  Seems to be back to his baseline currently.    EEG was done and did not show any epileptiform activity.  We will continue Plavix as before.  UA did not suggest infection.  WBC is normal.  Patient seen by physical therapy and they recommended skilled nursing facility.  Discussed with the sister who is the primary caregiver for the patient.  She would prefer for the patient to come home.  She will patient was recently at a skilled nursing facility.  He supposedly has a hospital bed arriving tomorrow.  She does request transportation by EMS.    Previous history of stroke Stable.  Continue home medications including Plavix.  LDL 75.  History of discitis Patient is currently on IV ceftriaxone at home.  He has a PICC line.  He is followed by infectious disease.  Continue home medication regimen.  Last date for this treatment is January 27.  Home health will be resumed.  History of dementia Seems to be at baseline.  History of COPD Stable.  Normocytic anemia Most likely due to anemia of chronic disease.  No evidence for overt bleeding.  Stable.  Okay for discharge back to home today.   PERTINENT LABS:  The results of significant diagnostics from this hospitalization (including imaging, microbiology, ancillary and laboratory) are listed below for reference.     Labs: Basic Metabolic Panel: Recent Labs  Lab 05/23/17 1800 05/23/17 1813 05/24/17 0014  NA 141 143  --   K 3.2* 3.2*  --   CL 105 103  --   CO2 25  --   --   GLUCOSE 93 90  --   BUN 5* 4*  --   CREATININE 0.82  0.70 0.89  CALCIUM 9.7  --   --    Liver Function Tests: Recent Labs  Lab 05/23/17 1800  AST 17  ALT 10*  ALKPHOS 127*  BILITOT 0.7  PROT 6.5  ALBUMIN 2.9*   Recent Labs  Lab 05/23/17 1800  LIPASE 26   Recent Labs  Lab 05/23/17 1926  AMMONIA 37*   CBC: Recent Labs  Lab 05/23/17 1800 05/23/17 1813  05/24/17 0014 05/24/17 0546  WBC 5.4  --  3.7* 6.1  NEUTROABS 3.7  --   --   --   HGB 11.1* 11.9* 11.7* 11.9*  HCT 34.5* 35.0* 37.4* 36.2*  MCV 88.9  --  89.7 88.3  PLT 199  --  16* 195     IMAGING STUDIES Mr Brain Wo Contrast  Result Date: 05/24/2017 CLINICAL DATA:  Initial evaluation for acute altered mental status, slurred speech. EXAM: MRI HEAD WITHOUT CONTRAST MRA HEAD WITHOUT CONTRAST TECHNIQUE: Multiplanar, multiecho pulse sequences of the brain and surrounding structures were obtained without intravenous contrast. Angiographic images of the head were obtained using MRA technique without contrast. COMPARISON:  Prior CT from earlier the same day. FINDINGS: MRI HEAD FINDINGS Brain: Study moderately degraded by motion artifact. Moderate temporal lobe predominant cerebral atrophy. Confluent T2/FLAIR hyperintensity within the periventricular and deep white matter both cerebral hemispheres, most consistent with chronic microvascular ischemic disease, moderate to advanced in nature. Chronic microvascular changes present within the pons as well. Scattered superimposed remote lacunar infarcts present within the bilateral thalami/corona radiata. Remote lacunar infarct noted within the mid right centrum semi ovale. Scattered tiny remote lacunar infarcts present within the bilateral cerebellar hemispheres. No abnormal foci of restricted diffusion to suggest acute or subacute ischemia. Gray-white matter differentiation maintained. No evidence for acute or chronic intracranial hemorrhage. No mass lesion, midline shift or mass effect. Diffuse ventricular prominence related to global parenchymal volume loss without hydrocephalus. No extra-axial fluid collection. Major dural sinuses are grossly patent. Vascular: Major intracranial vascular flow voids are maintained. Dolichoectasia of the intracranial circulation noted. Skull and upper cervical spine: Craniocervical junction not well evaluated due to motion  artifact. Bone marrow signal intensity within normal limits. No scalp soft tissue abnormality. Sinuses/Orbits: Globes and orbital soft tissues demonstrate no acute abnormality. Patient status post cataract extraction on the left. Left fronto ethmoidal sinusitis noted. Paranasal sinuses are otherwise clear. Right mastoid effusion noted. Inner ear structures grossly normal. Other: None. MRA HEAD FINDINGS ANTERIOR CIRCULATION: Examination technically limited by motion artifact. Distal cervical segments of the internal carotid arteries are tortuous but widely patent with antegrade flow. Internal carotid arteries widely patent to the termini without flow-limiting stenosis. A1 segments widely patent. Normal anterior communicating artery. Anterior cerebral arteries patent to their distal aspects without flow-limiting stenosis. Proximal and mid aspects of the M1 segments are widely patent bilaterally. Distal M1 segments and MCA bifurcations not well evaluated due to motion. Distal MCA branches are well perfused and fairly symmetric. POSTERIOR CIRCULATION: Vertebral arteries somewhat ectatic and tortuous but widely patent to the vertebrobasilar junction. Right vertebral artery slightly dominant. Partially visualized posterior inferior cerebral arteries patent bilaterally. Basilar tortuous but widely patent to its distal aspect without stenosis. Superior cerebral arteries patent proximally. Both of the posterior cerebral artery supplied via the basilar and are widely patent proximally, not well evaluated distally due to motion. No aneurysm. IMPRESSION: MRI HEAD IMPRESSION: 1. No acute intracranial infarct or other process identified. 2. Moderately advanced chronic microvascular ischemic disease with scattered remote lacunar infarcts involving the  bilateral thalami and corona radiata, as well as the bilateral cerebral hemispheres. 3. Moderately advanced cerebral atrophy. 4. Chronic left fronto ethmoidal sinusitis. MRA HEAD  IMPRESSION: 1. Negative intracranial MRA for large vessel occlusion. No proximal high-grade or correctable stenosis. 2. Dolichoectasia of the intracranial circulation. Electronically Signed   By: Jeannine Boga M.D.   On: 05/24/2017 00:38   Dg Abd Acute W/chest  Result Date: 05/23/2017 CLINICAL DATA:  Constipation.  Evaluate for obstruction. EXAM: DG ABDOMEN ACUTE W/ 1V CHEST COMPARISON:  04/19/2017 FINDINGS: Formed stool mainly in the descending and transverse colon. Bowel gas is less extensive than on prior. No rectal impaction. No concerning mass effect or gas collection. IVC filter with stable rotation. Remote lumbosacral fusion and left hemipelvis repair. Severe L3-4/L4-5 disc disease as seen on recent MRI. 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 Normal heart size. Mild aortic tortuosity. Emphysematous changes greater on the right. Right upper extremity PICC with tip at the SVC. There is no edema, consolidation, effusion, or pneumothorax. IMPRESSION: 1. Negative for bowel obstruction or excessive stool retention. 2. Emphysema.  No acute finding in the chest. Electronically Signed   By: Monte Fantasia M.D.   On: 05/23/2017 16:22   Mr Jodene Nam Head Wo Contrast  Result Date: 05/24/2017 CLINICAL DATA:  Initial evaluation for acute altered mental status, slurred speech. EXAM: MRI HEAD WITHOUT CONTRAST MRA HEAD WITHOUT CONTRAST TECHNIQUE: Multiplanar, multiecho pulse sequences of the brain and surrounding structures were obtained without intravenous contrast. Angiographic images of the head were obtained using MRA technique without contrast. COMPARISON:  Prior CT from earlier the same day. FINDINGS: MRI HEAD FINDINGS Brain: Study moderately degraded by motion artifact. Moderate temporal lobe predominant cerebral atrophy. Confluent T2/FLAIR hyperintensity within the periventricular and deep white matter both cerebral hemispheres, most consistent with chronic microvascular ischemic  disease, moderate to advanced in nature. Chronic microvascular changes present within the pons as well. Scattered superimposed remote lacunar infarcts present within the bilateral thalami/corona radiata. Remote lacunar infarct noted within the mid right centrum semi ovale. Scattered tiny remote lacunar infarcts present within the bilateral cerebellar hemispheres. No abnormal foci of restricted diffusion to suggest acute or subacute ischemia. Gray-white matter differentiation maintained. No evidence for acute or chronic intracranial hemorrhage. No mass lesion, midline shift or mass effect. Diffuse ventricular prominence related to global parenchymal volume loss without hydrocephalus. No extra-axial fluid collection. Major dural sinuses are grossly patent. Vascular: Major intracranial vascular flow voids are maintained. Dolichoectasia of the intracranial circulation noted. Skull and upper cervical spine: Craniocervical junction not well evaluated due to motion artifact. Bone marrow signal intensity within normal limits. No scalp soft tissue abnormality. Sinuses/Orbits: Globes and orbital soft tissues demonstrate no acute abnormality. Patient status post cataract extraction on the left. Left fronto ethmoidal sinusitis noted. Paranasal sinuses are otherwise clear. Right mastoid effusion noted. Inner ear structures grossly normal. Other: None. MRA HEAD FINDINGS ANTERIOR CIRCULATION: Examination technically limited by motion artifact. Distal cervical segments of the internal carotid arteries are tortuous but widely patent with antegrade flow. Internal carotid arteries widely patent to the termini without flow-limiting stenosis. A1 segments widely patent. Normal anterior communicating artery. Anterior cerebral arteries patent to their distal aspects without flow-limiting stenosis. Proximal and mid aspects of the M1 segments are widely patent bilaterally. Distal M1 segments and MCA bifurcations not well evaluated due to  motion. Distal MCA branches are well perfused and fairly symmetric. POSTERIOR CIRCULATION: Vertebral arteries somewhat ectatic and tortuous but widely patent to the vertebrobasilar  junction. Right vertebral artery slightly dominant. Partially visualized posterior inferior cerebral arteries patent bilaterally. Basilar tortuous but widely patent to its distal aspect without stenosis. Superior cerebral arteries patent proximally. Both of the posterior cerebral artery supplied via the basilar and are widely patent proximally, not well evaluated distally due to motion. No aneurysm. IMPRESSION: MRI HEAD IMPRESSION: 1. No acute intracranial infarct or other process identified. 2. Moderately advanced chronic microvascular ischemic disease with scattered remote lacunar infarcts involving the bilateral thalami and corona radiata, as well as the bilateral cerebral hemispheres. 3. Moderately advanced cerebral atrophy. 4. Chronic left fronto ethmoidal sinusitis. MRA HEAD IMPRESSION: 1. Negative intracranial MRA for large vessel occlusion. No proximal high-grade or correctable stenosis. 2. Dolichoectasia of the intracranial circulation. Electronically Signed   By: Jeannine Boga M.D.   On: 05/24/2017 00:38   Ct Head Code Stroke Wo Contrast  Result Date: 05/23/2017 CLINICAL DATA:  Code stroke. Left-sided weakness. Speech disturbance. Lethargy. History of stroke. EXAM: CT HEAD WITHOUT CONTRAST TECHNIQUE: Contiguous axial images were obtained from the base of the skull through the vertex without intravenous contrast. COMPARISON:  Head CT 04/12/2017 and MRI 04/16/2017 FINDINGS: Brain: There is no evidence of acute infarct, intracranial hemorrhage, mass, midline shift, or extra-axial fluid collection. Chronic lacunar infarcts are again noted in the basal ganglia, thalami, and posterior right frontal subcortical white matter. Patchy to confluent hypodensities elsewhere in the cerebral white matter bilaterally are similar to  the prior CT and nonspecific but compatible with moderate chronic small vessel ischemic disease. There is moderate cerebral atrophy. Vascular: Calcified atherosclerosis at the skull base. No hyperdense vessel. Skull: No fracture or suspicious osseous lesion. Sinuses/Orbits: Increased left frontal sinus and left ethmoid air cell opacification with a fluid level noted in the frontal sinus. Persistent right mastoid effusion. Left cataract extraction. Other: None. ASPECTS [facility] Stroke Program Early CT Score) - Ganglionic level infarction (caudate, lentiform nuclei, internal capsule, insula, M1-M3 cortex): 7 - Supraganglionic infarction (M4-M6 cortex): 3 Total score (0-10 with 10 being normal): 10 IMPRESSION: 1. No evidence of acute intracranial abnormality. 2. ASPECTS is 10. 3. Chronic ischemia as above. These results were communicated to Dr. Rory Percy at 3:59 pm on 05/23/2017 by text page via the [facility] messaging system. Electronically Signed   By: Logan Bores M.D.   On: 05/23/2017 15:59    DISCHARGE EXAMINATION: Vitals:   05/24/17 2156 05/25/17 0102 05/25/17 0513 05/25/17 1012  BP: 123/80 120/82 125/88 (!) 142/85  Pulse: 93 96 95 93  Resp: '16 16 16 16  ' Temp: 98.7 F (37.1 C) 98.8 F (37.1 C) 98.5 F (36.9 C) 98.5 F (36.9 C)  TempSrc: Oral Oral Oral Oral  SpO2: 97% 95% 97% 98%  Weight:      Height:       General appearance: alert, distracted and no distress Resp: clear to auscultation bilaterally Cardio: regular rate and rhythm, S1, S2 normal, no murmur, click, rub or gallop GI: soft, non-tender; bowel sounds normal; no masses,  no organomegaly  DISPOSITION: Home with home health  Discharge Instructions    Call MD for:  difficulty breathing, headache or visual disturbances   Complete by:  As directed    Call MD for:  extreme fatigue   Complete by:  As directed    Call MD for:  hives   Complete by:  As directed    Call MD for:  persistant dizziness or light-headedness   Complete by:  As  directed    Call MD for:  persistant nausea and vomiting   Complete by:  As directed    Call MD for:  severe uncontrolled pain   Complete by:  As directed    Call MD for:  temperature >100.4   Complete by:  As directed    Discharge instructions   Complete by:  As directed    Resume home health. Continue taking medications as before. Resume diet as before.  You were cared for by a hospitalist during your hospital stay. If you have any questions about your discharge medications or the care you received while you were in the hospital after you are discharged, you can call the unit and asked to speak with the hospitalist on call if the hospitalist that took care of you is not available. Once you are discharged, your primary care physician will handle any further medical issues. Please note that NO REFILLS for any discharge medications will be authorized once you are discharged, as it is imperative that you return to your primary care physician (or establish a relationship with a primary care physician if you do not have one) for your aftercare needs so that they can reassess your need for medications and monitor your lab values. If you do not have a primary care physician, you can call [phone] for a physician referral.   Increase activity slowly   Complete by:  As directed         Allergies as of 05/25/2017      Reactions   Aspirin    unknown   Penicillins    Unknown Has patient had a PCN reaction causing immediate rash, facial/tongue/throat swelling, SOB or lightheadedness with hypotension: NO Has patient had a PCN reaction causing severe rash involving mucus membranes or skin necrosis: NO Has patient had a PCN reaction that required hospitalization NO Has patient had a PCN reaction occurring within the last 10 years: NO If all of the above answers are "NO", then may proceed with Cephalosporin use.      Medication List    TAKE these medications   acetaminophen 325 MG tablet Commonly  known as:  TYLENOL Take 2 tablets (650 mg total) by mouth every 6 (six) hours as needed for mild pain (or Fever >/= 101).   albuterol 108 (90 Base) MCG/ACT inhaler Commonly known as:  PROVENTIL HFA;VENTOLIN HFA Inhale 2 puffs into the lungs every 4 (four) hours as needed for wheezing or shortness of breath.   amLODipine 10 MG tablet Commonly known as:  NORVASC Take 10 mg by mouth daily.   cefTRIAXone IVPB Commonly known as:  ROCEPHIN Inject 2 g into the vein every 12 (twelve) hours. Indication:  Vertebral Osteo/Diskitis Last Day of Therapy:  05/28/17 Labs - Once weekly:  CBC/D and BMP, Labs - Every other week:  ESR and CRP   Chlorhexidine Gluconate Cloth 2 % Pads Apply 6 each topically daily.   clopidogrel 75 MG tablet Commonly known as:  PLAVIX Take 75 mg by mouth daily with breakfast.   doxazosin 8 MG tablet Commonly known as:  CARDURA Take 8 mg by mouth at bedtime.   ENSURE PLUS Liqd Take 237 mLs by mouth 3 (three) times daily between meals.   feeding supplement (PRO-STAT SUGAR FREE 64) Liqd Take 30 mLs by mouth 2 (two) times daily.   finasteride 5 MG tablet Commonly known as:  PROSCAR Take 5 mg by mouth daily.   FLUoxetine 20 MG capsule Commonly known as:  PROZAC Take 40 mg by mouth daily.  fluticasone 50 MCG/ACT nasal spray Commonly known as:  FLONASE Place 2 sprays into both nostrils at bedtime as needed for allergies or rhinitis.   folic acid 1 MG tablet Commonly known as:  FOLVITE Take 1 mg by mouth daily.   losartan 50 MG tablet Commonly known as:  COZAAR Take 75 mg by mouth daily.   Melatonin 3 MG Caps Take 1 capsule by mouth at bedtime.   mirtazapine 30 MG tablet Commonly known as:  REMERON Take 30 mg by mouth at bedtime.   mouth rinse Liqd solution 15 mLs by Mouth Rinse route 2 (two) times daily. What changed:  when to take this   MYRBETRIQ PO Take 25 mg by mouth daily.   Paliperidone Palmitate 546 MG/1.75ML Susp Inject 546 mg into the  muscle every 3 (three) months.   polyethylene glycol packet Commonly known as:  MIRALAX / GLYCOLAX Take 17 g by mouth daily.   pravastatin 40 MG tablet Commonly known as:  PRAVACHOL Take 40 mg by mouth at bedtime.   senna 8.6 MG Tabs tablet Commonly known as:  SENOKOT Take 1 tablet by mouth 2 (two) times daily.   SPIRIVA HANDIHALER 18 MCG inhalation capsule Generic drug:  tiotropium INHALE 1 PUFF VIA HANDIHALER EVERY FOR COPD   Vitamin D 2000 units tablet Take 2,000 Units by mouth daily.        Follow-up Information    Henreitta Cea, MD. Schedule an appointment as soon as possible for a visit in 1 week(s).   Specialty:  Family Medicine Contact information: [address] [place] [address] [phone]           TOTAL DISCHARGE TIME: 35 mins  Bonnielee Haff  [facility] Pager [phone]  05/25/2017, 12:52 PM

## 2017-05-25 NOTE — Progress Notes (Signed)
Discharge orders received.  Discharge instructions and follow-up appointments reviewed with the patient.  VSS upon discharge.  IV removed and education complete.  Transported via [facility].   Sondra ComeSilva, Raeden Belzer M, RN

## 2017-05-25 NOTE — Care Management Note (Signed)
Case Management Note  Patient Details  Name: Norman Davis MRN: [id number] Date of Birth: 10-12-1940  Subjective/Objective:   Pt in to r/o CVA. He is from home with his sister. Pt was receiving [facility] prior to admission [facility]                  Action/Plan: Pt [facility]. [facility] aware of discharge and resumption orders. Pts sister Ms Norman Davis notified of d/c and she asked patient transport home via [facility].  CM inquired about home DME. Ms Norman Davis [place] [facility] [place] and she does not know of any other needs at this time.  CM called and arranged transport home via [facility]. Bedside RN aware and Ms Norman Davis updated of timing. Transport form on the front of the chart.   Expected Discharge Date:  05/25/17               Expected Discharge Plan:  Home w Home Health Services  In-House Referral:     Discharge planning Services  CM Consult  Post Acute Care Choice:  Home Health, Resumption of Svcs/PTA Provider Choice offered to:  Sibling  DME Arranged:    DME Agency:     HH Arranged:  RN, PT, Nurse's Aide [facility]  Status of Service:  Completed, signed off  If discussed at Long Length of Stay Meetings, dates discussed:    Additional Comments:  Norman BaloKelli F Ashlynn Gunnels, RN 05/25/2017, 11:36 AM

## 2017-05-29 ENCOUNTER — Telehealth: Payer: Self-pay | Admitting: *Deleted

## 2017-05-29 NOTE — Telephone Encounter (Signed)
I gave order to have PICC removed.

## 2017-05-29 NOTE — Telephone Encounter (Signed)
Waynetta SandyBeth, [facility], called for orders on this patient. He completed IV antibiotics 1/27, she is looking for orders to either continue or pull PICC. She stated that the patient is in a Skilled Facility and is difficult to bring to appointments. Patient has not yet been to clinic for follow up. He was hospitalized on the day he was scheduled to come here.  [facility], advised her she should also ask about the need for follow up due to the difficult of transporting him. She verbalized understanding. Andree CossHowell, Michelle M, RN

## 2018-03-20 ENCOUNTER — Emergency Department (HOSPITAL_COMMUNITY): Payer: Medicare HMO

## 2018-03-20 ENCOUNTER — Emergency Department (HOSPITAL_COMMUNITY)
Admission: EM | Admit: 2018-03-20 | Discharge: 2018-03-20 | Disposition: A | Discharge status: Home / Self Care | Payer: Medicare HMO | Attending: Emergency Medicine | Admitting: Emergency Medicine

## 2018-03-20 ENCOUNTER — Encounter (HOSPITAL_COMMUNITY): Payer: Self-pay | Admitting: Emergency Medicine

## 2018-03-20 ENCOUNTER — Other Ambulatory Visit: Payer: Self-pay

## 2018-03-20 DIAGNOSIS — Z8673 Personal history of transient ischemic attack (TIA), and cerebral infarction without residual deficits: Secondary | ICD-10-CM | POA: Diagnosis not present

## 2018-03-20 DIAGNOSIS — M88851 Osteitis deformans of right thigh: Secondary | ICD-10-CM | POA: Diagnosis not present

## 2018-03-20 DIAGNOSIS — S0003XA Contusion of scalp, initial encounter: Secondary | ICD-10-CM | POA: Diagnosis not present

## 2018-03-20 DIAGNOSIS — W01198A Fall on same level from slipping, tripping and stumbling with subsequent striking against other object, initial encounter: Secondary | ICD-10-CM | POA: Diagnosis not present

## 2018-03-20 DIAGNOSIS — Z7902 Long term (current) use of antithrombotics/antiplatelets: Secondary | ICD-10-CM | POA: Insufficient documentation

## 2018-03-20 DIAGNOSIS — Z87891 Personal history of nicotine dependence: Secondary | ICD-10-CM | POA: Diagnosis not present

## 2018-03-20 DIAGNOSIS — Z79899 Other long term (current) drug therapy: Secondary | ICD-10-CM | POA: Diagnosis not present

## 2018-03-20 DIAGNOSIS — Y93E1 Activity, personal bathing and showering: Secondary | ICD-10-CM | POA: Diagnosis not present

## 2018-03-20 DIAGNOSIS — J449 Chronic obstructive pulmonary disease, unspecified: Secondary | ICD-10-CM | POA: Insufficient documentation

## 2018-03-20 DIAGNOSIS — W19XXXA Unspecified fall, initial encounter: Secondary | ICD-10-CM

## 2018-03-20 DIAGNOSIS — Y92002 Bathroom of unspecified non-institutional (private) residence single-family (private) house as the place of occurrence of the external cause: Secondary | ICD-10-CM | POA: Insufficient documentation

## 2018-03-20 DIAGNOSIS — Y999 Unspecified external cause status: Secondary | ICD-10-CM | POA: Diagnosis not present

## 2018-03-20 DIAGNOSIS — S0990XA Unspecified injury of head, initial encounter: Secondary | ICD-10-CM | POA: Diagnosis present

## 2018-03-20 DIAGNOSIS — I10 Essential (primary) hypertension: Secondary | ICD-10-CM | POA: Diagnosis not present

## 2018-03-20 DIAGNOSIS — Y92009 Unspecified place in unspecified non-institutional (private) residence as the place of occurrence of the external cause: Secondary | ICD-10-CM

## 2018-03-20 NOTE — ED Notes (Signed)
RN transported pt to ride. Pt taken home by friend.

## 2018-03-20 NOTE — Progress Notes (Addendum)
CSW spoke with pt at bedside. Pt provided additional numbers for CSW to try sister at. The numbers did not work in trying to reach pt's sister. Pt lives with sister. Pt reports sister does not work and should be home right now.   Norman GainerMoses Davis Secretary received phone call from pt's sister, [phone]. Pt's sister will be coming to pick him up.   Norman CircleKelsy Willi Davis, Silverio LayLCSWA [facility]  [phone]

## 2018-03-20 NOTE — ED Notes (Signed)
Number for transportation per sister request: [phone]

## 2018-03-20 NOTE — ED Provider Notes (Signed)
[facility] EMERGENCY DEPARTMENT Provider Note   CSN: [id number] Arrival date & time: 03/20/18  1236     History   Chief Complaint Chief Complaint  Patient presents with  . fall on thinners    HPI Norman Davis is a 77 y.o. male who presents the emergency department after fall.  He has a long-standing and complicated medical history that includes schizophrenia which is in remission, Paget's disease, history of arterial disease, history of CVA, MI and PE who presents via EMS for fall.  The patient slipped in the bathtub and hit the back of his head on the bathroom wall.  He did not lose consciousness.  He has a large, tender hematoma on the back of the head.  He denies any other injuries or complaints. HPI  Past Medical History:  Diagnosis Date  . Cholelithiasis   . Fall 07/22/2015  . GERD (gastroesophageal reflux disease)   . H/O urinary retention   . Hypercholesteremia   . Hypertension   . Nephrolithiasis    right  . 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 Schizophrenia (HCC)   . Shortness of breath dyspnea   . Small bowel obstruction due to adhesions [facility])     Patient Active Problem List   Diagnosis Date Noted  . Altered mental state 05/24/2017  . Altered mental status   . TIA (transient ischemic attack) 05/23/2017  . 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 Cerebrovascular accident (CVA) (HCC)   . 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  . Tachycardia 04/12/2017  . Spinal stenosis 08/07/2015  . Absence of bladder continence 08/07/2015  . Pulmonary emphysema (HCC) 08/07/2015  . Essential hypertension 08/07/2015  . Depression, major, recurrent, moderate (HCC) 07/30/2015    Class: Chronic  . GERD without esophagitis 07/28/2015  . BPH (benign prostatic hyperplasia) 07/28/2015  . UI (urinary incontinence) 07/28/2015  . Fall 07/22/2015  . HLD (hyperlipidemia) 07/22/2015  . Depression 07/22/2015  . Rhabdomyolysis 07/22/2015  .  Hypokalemia 07/22/2015  . Elevated CK   . COPD exacerbation (HCC) 06/12/2015  . Elevated troponin 06/12/2015  . Elevated d-dimer   . Leg pain, left 04/28/2015  . Chest pain 04/28/2015  . NSTEMI (non-ST elevated myocardial infarction) (HCC) 04/28/2015  . Abdominal pain 04/28/2015  . COPD (chronic obstructive pulmonary disease) (HCC) 04/11/2013  . Hypercholesteremia   . Hypertension   . H/O urinary retention   . Myocardial infarction (HCC)   . Gallstones 03/17/2013  . Renal stone 03/17/2013  . SBO (small bowel obstruction) (HCC) 03/16/2013  . AKI (acute kidney injury) (HCC) 03/16/2013  . High anion gap metabolic acidosis 03/16/2013  . 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) 03/16/2013    Past Surgical History:  Procedure Laterality Date  . BACK SURGERY    . IR LUMBAR DISC ASPIRATION W/IMG GUIDE  04/13/2017  . LIVER SURGERY    . PELVIC FRACTURE SURGERY    . TOTAL HIP ARTHROPLASTY          Home Medications    Prior to Admission medications   Medication Sig Start Date End Date Taking? Authorizing Provider  acetaminophen (TYLENOL) 325 MG tablet Take 2 tablets (650 mg total) by mouth every 6 (six) hours as needed for mild pain (or Fever >/= 101). 04/20/17   Mikhail, Nita Sells, DO  albuterol (PROVENTIL HFA;VENTOLIN HFA) 108 (90 BASE) MCG/ACT inhaler Inhale 2 puffs into the lungs every 4 (four) hours as needed for wheezing or shortness of breath.    [provider]  Amino Acids-Protein Hydrolys (FEEDING SUPPLEMENT, PRO-STAT SUGAR FREE 64,) LIQD Take 30 mLs by mouth 2 (two) times daily. Patient not taking: Reported on 05/23/2017 04/20/17   Edsel PetrinMikhail, Maryann, DO  amLODipine (NORVASC) 10 MG tablet Take 10 mg by mouth daily.    [provider]  Chlorhexidine Gluconate Cloth 2 % PADS Apply 6 each topically daily. 04/20/17   Edsel PetrinMikhail, Maryann, DO  Cholecalciferol (VITAMIN D) 2000 units tablet Take 2,000 Units by mouth daily.    [provider]  clopidogrel (PLAVIX) 75 MG  tablet Take 75 mg by mouth daily with breakfast.    [provider]  doxazosin (CARDURA) 8 MG tablet Take 8 mg by mouth at bedtime.    [provider]  ENSURE PLUS (ENSURE PLUS) LIQD Take 237 mLs by mouth 3 (three) times daily between meals.    [provider]  finasteride (PROSCAR) 5 MG tablet Take 5 mg by mouth daily.    [provider]  FLUoxetine (PROZAC) 20 MG capsule Take 40 mg by mouth daily.     [provider]  fluticasone (FLONASE) 50 MCG/ACT nasal spray Place 2 sprays into both nostrils at bedtime as needed for allergies or rhinitis.     [provider]  folic acid (FOLVITE) 1 MG tablet Take 1 mg by mouth daily.    [provider]  losartan (COZAAR) 50 MG tablet Take 75 mg by mouth daily.     [provider]  Melatonin 3 MG CAPS Take 1 capsule by mouth at bedtime.    [provider]  Mirabegron (MYRBETRIQ PO) Take 25 mg by mouth daily.     [provider]  mirtazapine (REMERON) 30 MG tablet Take 30 mg by mouth at bedtime.    [provider]  mouth rinse LIQD solution 15 mLs by Mouth Rinse route 2 (two) times daily. Patient taking differently: 15 mLs by Mouth Rinse route daily.  04/20/17   Edsel PetrinMikhail, Maryann, DO  Paliperidone Palmitate 546 MG/1.75ML SUSP Inject 546 mg into the muscle every 3 (three) months.    [provider]  polyethylene glycol (MIRALAX / GLYCOLAX) packet Take 17 g by mouth daily.     [provider]  pravastatin (PRAVACHOL) 40 MG tablet Take 40 mg by mouth at bedtime.     [provider]  senna (SENOKOT) 8.6 MG TABS tablet Take 1 tablet by mouth 2 (two) times daily.     [provider]  SPIRIVA HANDIHALER 18 MCG inhalation capsule INHALE 1 PUFF VIA HANDIHALER EVERY FOR COPD Patient not taking: Reported on 05/23/2017 08/21/15   Margit HanksAlexander, Anne D, MD    Family History Family History  Problem Relation Age of Onset  . Cancer Mother   .  Cancer Father     Social History Social History   Tobacco Use  . Smoking status: Former Smoker    Packs/day: 1.00    Types: Cigarettes    Last attempt to quit: 05/02/2000    Years since quitting: 17.8  . Smokeless tobacco: Never Used  Substance Use Topics  . Alcohol use: No  . Drug use: No     Allergies   Aspirin and Penicillins   Review of Systems Review of Systems Ten systems reviewed and are negative for acute change, except as noted in the HPI.    Physical Exam Updated Vital Signs BP (!) 156/103   Pulse 80   Temp 98.8 F (37.1 C) (Oral)   Resp 16  Ht 5\' 9"  (1.753 m)   Wt 68 kg   SpO2 99%   BMI 22.15 kg/m   Physical Exam  Constitutional: He appears well-developed and well-nourished. No distress.  HENT:  Head: Normocephalic.    Eyes: Conjunctivae are normal. No scleral icterus.  Neck: Normal range of motion. Neck supple.    Cardiovascular: Normal rate, regular rhythm and normal heart sounds.  Pulmonary/Chest: Effort normal and breath sounds normal. No respiratory distress.  Abdominal: Soft. There is no tenderness.  Musculoskeletal: He exhibits no edema.  Neurological: He is alert.  Skin: Skin is warm and dry. He is not diaphoretic.  Psychiatric: His behavior is normal.  Nursing note and vitals reviewed.    ED Treatments / Results  Labs (all labs ordered are listed, but only abnormal results are displayed) Labs Reviewed - No data to display  EKG EKG Interpretation  Date/Time:  Tuesday March 20 2018 12:44:07 EST Ventricular Rate:  67 PR Interval:    QRS Duration: 163 QT Interval:  473 QTC Calculation: 500 R Axis:   -78 Text Interpretation:  Sinus rhythm RBBB and LAFB No significant change since last tracing Confirmed by Alvira Monday ([id number]) on 03/20/2018 3:30:37 PM   Radiology Ct Head Wo Contrast  Result Date: 03/20/2018 CLINICAL DATA:  Patient fell in bathtub hitting back of head. Hematoma posteriorly. EXAM: CT HEAD WITHOUT  CONTRAST CT CERVICAL SPINE WITHOUT CONTRAST TECHNIQUE: Multidetector CT imaging of the head and cervical spine was performed following the standard protocol without intravenous contrast. Multiplanar CT image reconstructions of the cervical spine were also generated. COMPARISON:  MRI 05/23/2017, head CT 05/23/2017, cervical spine CT 07/21/2015 FINDINGS: CT HEAD FINDINGS Brain: Chronic moderate degree of small vessel ischemic disease of periventricular and subcortical white matter with chronic lacunar infarcts in the basal ganglia, thalami and right frontal subcortical white matter. Moderate degree of cerebral atrophy is redemonstrated. No acute intracranial hemorrhage, large vascular territory infarct or extra-axial fluid collections. Midline fourth ventricle and basal cisterns. Intact brainstem cerebellum with cerebellar atrophy. Vascular: No hyperdense vessel sign. Skull: No acute skull fracture. Sinuses/Orbits: Chronic left fronto ethmoid sinusitis with mucosal thickening and inspissated mucus. Other: Scalp contusion of the occiput. CT CERVICAL SPINE FINDINGS Alignment: Slight reversal cervical lordosis. Intact craniocervical relationship. Intact atlantodental interval. Skull base and vertebrae: No acute fracture. No primary bone lesion or focal pathologic process. Soft tissues and spinal canal: No prevertebral fluid or swelling. No visible canal hematoma. Disc levels: Moderate to marked disc flattening from C2 through T1 with uncovertebral joint osteoarthritis and uncinate spurring bilaterally at these levels. Moderate left C6-7 and mild-to-moderate C5-6 right foraminal encroachment from uncinate spurring. Upper chest: Emphysematous changes at the apices. Other: None IMPRESSION: 1. Scalp contusion of the occiput without underlying skull fracture. 2. Chronic moderate small vessel ischemic disease of periventricular and subcortical white matter with chronic lacunar infarcts as above. 3. No acute intracranial  abnormality. 4. Cervical spondylosis without acute posttraumatic fracture or subluxation. Electronically Signed   By: Tollie Eth M.D.   On: 03/20/2018 16:17   Ct Cervical Spine Wo Contrast  Result Date: 03/20/2018 CLINICAL DATA:  Patient fell in bathtub hitting back of head. Hematoma posteriorly. EXAM: CT HEAD WITHOUT CONTRAST CT CERVICAL SPINE WITHOUT CONTRAST TECHNIQUE: Multidetector CT imaging of the head and cervical spine was performed following the standard protocol without intravenous contrast. Multiplanar CT image reconstructions of the cervical spine were also generated. COMPARISON:  MRI 05/23/2017, head CT 05/23/2017, cervical spine CT 07/21/2015 FINDINGS: CT HEAD FINDINGS  Brain: Chronic moderate degree of small vessel ischemic disease of periventricular and subcortical white matter with chronic lacunar infarcts in the basal ganglia, thalami and right frontal subcortical white matter. Moderate degree of cerebral atrophy is redemonstrated. No acute intracranial hemorrhage, large vascular territory infarct or extra-axial fluid collections. Midline fourth ventricle and basal cisterns. Intact brainstem cerebellum with cerebellar atrophy. Vascular: No hyperdense vessel sign. Skull: No acute skull fracture. Sinuses/Orbits: Chronic left fronto ethmoid sinusitis with mucosal thickening and inspissated mucus. Other: Scalp contusion of the occiput. CT CERVICAL SPINE FINDINGS Alignment: Slight reversal cervical lordosis. Intact craniocervical relationship. Intact atlantodental interval. Skull base and vertebrae: No acute fracture. No primary bone lesion or focal pathologic process. Soft tissues and spinal canal: No prevertebral fluid or swelling. No visible canal hematoma. Disc levels: Moderate to marked disc flattening from C2 through T1 with uncovertebral joint osteoarthritis and uncinate spurring bilaterally at these levels. Moderate left C6-7 and mild-to-moderate C5-6 right foraminal encroachment from  uncinate spurring. Upper chest: Emphysematous changes at the apices. Other: None IMPRESSION: 1. Scalp contusion of the occiput without underlying skull fracture. 2. Chronic moderate small vessel ischemic disease of periventricular and subcortical white matter with chronic lacunar infarcts as above. 3. No acute intracranial abnormality. 4. Cervical spondylosis without acute posttraumatic fracture or subluxation. Electronically Signed   By: Tollie Eth M.D.   On: 03/20/2018 16:17   Dg Hip Unilat W Or Wo Pelvis 2-3 Views Left  Result Date: 03/20/2018 CLINICAL DATA:  Patient fell in bathtub.  Pain. EXAM: DG HIP (WITH OR WITHOUT PELVIS) 2-3V LEFT COMPARISON:  07/22/2015. FINDINGS: Diffuse bone demineralization appears slightly worse. The lucencies between coarsened trabeculae as noted in the RIGHT hip and hemipelvis are increasingly prominent, possible coexistent Paget's disease. There has been instrumentation in the lumbar spine with pedicle screw and rods. There has been previous LEFT iliac fracture, also with instrumentation. There is severe joint space narrowing of both hips. There is no definite acute fracture. IMPRESSION: Chronic changes as described.  No definite acute fracture. Electronically Signed   By: Elsie Stain M.D.   On: 03/20/2018 16:24    Procedures Procedures (including critical care time)  Medications Ordered in ED Medications - No data to display   Initial Impression / Assessment and Plan / ED Course  I have reviewed the triage vital signs and the nursing notes.  Pertinent labs & imaging results that were available during my care of the patient were reviewed by me and considered in my medical decision making (see chart for details).     Patient here for mechanical fall.  Noted to be hypertensive.  He was ambulatory here in the emergency department.  Hematoma to the back of head.  Negative imaging.  Patient appears appropriate for discharge at this time.  Patient hip x-ray, CT  head and C-spine were reviewed personally.  I agree with radiologic interpretation.  Gust return precautions  Final Clinical Impressions(s) / ED Diagnoses   Final diagnoses:  Fall at home, initial encounter  Hematoma of scalp, initial encounter    ED Discharge Orders    None       Arthor Captain, PA-C 03/20/18 1654    Alvira Monday, MD 03/21/18 [phone]

## 2018-03-20 NOTE — ED Notes (Signed)
Pt reported he does not have a way home. No numbers in chart were answered or were not related to pt. Pt states he does not have keys on him. RN to contact social work.

## 2018-03-20 NOTE — Discharge Instructions (Addendum)
Contact a health care provider if: You have a fever. The swelling or discoloration gets worse. You develop more hematomas. Get help right away if: Your pain is worse or your pain is not controlled with medicine. Your skin over the hematoma breaks or starts bleeding. Your hematoma is in your chest or abdomen and you have weakness, shortness of breath, or a change in consciousness. You have a hematoma on your scalp caused by a fall or injury and you have a headache that gets worse, trouble speaking or understanding, weakness, or a change in alertness or consciousness.

## 2018-03-20 NOTE — ED Notes (Signed)
Per Jonna ClarkLillie (sister), family friend Gavin Pounddeborah is coming to pick up patient in 30 minutes.

## 2018-03-20 NOTE — ED Notes (Signed)
Patient transported to CT 

## 2018-03-20 NOTE — ED Notes (Signed)
Social work notified of need for management.

## 2018-03-20 NOTE — ED Notes (Signed)
Social work made contact with sister who confirmed she will be coming to get pt. Sister did not give a time frame.

## 2018-03-20 NOTE — ED Triage Notes (Signed)
To ED via [facility] from home, slipped in the bathtub, has hematoma to back of head- no LOC- pt is alert/oriented x 4- is normally independent, lives with sister --

## 2018-03-20 NOTE — ED Notes (Signed)
Patient verbalizes understanding of discharge instructions. Opportunity for questioning and answers were provided. Armband removed by staff, pt discharged from ED.  

## 2018-05-12 ENCOUNTER — Emergency Department (HOSPITAL_COMMUNITY)
Admission: EM | Admit: 2018-05-12 | Discharge: 2018-05-13 | Disposition: A | Discharge status: Home / Self Care | Payer: Medicare HMO | Attending: Emergency Medicine | Admitting: Emergency Medicine

## 2018-05-12 ENCOUNTER — Encounter (HOSPITAL_COMMUNITY): Payer: Self-pay

## 2018-05-12 ENCOUNTER — Other Ambulatory Visit: Payer: Self-pay

## 2018-05-12 ENCOUNTER — Emergency Department (HOSPITAL_COMMUNITY): Payer: Medicare HMO

## 2018-05-12 DIAGNOSIS — G8929 Other chronic pain: Secondary | ICD-10-CM

## 2018-05-12 DIAGNOSIS — E875 Hyperkalemia: Secondary | ICD-10-CM | POA: Insufficient documentation

## 2018-05-12 DIAGNOSIS — I10 Essential (primary) hypertension: Secondary | ICD-10-CM | POA: Insufficient documentation

## 2018-05-12 DIAGNOSIS — Z79899 Other long term (current) drug therapy: Secondary | ICD-10-CM | POA: Insufficient documentation

## 2018-05-12 DIAGNOSIS — M546 Pain in thoracic spine: Secondary | ICD-10-CM | POA: Diagnosis not present

## 2018-05-12 DIAGNOSIS — R109 Unspecified abdominal pain: Secondary | ICD-10-CM | POA: Insufficient documentation

## 2018-05-12 DIAGNOSIS — S22080A Wedge compression fracture of T11-T12 vertebra, initial encounter for closed fracture: Secondary | ICD-10-CM

## 2018-05-12 DIAGNOSIS — Z87891 Personal history of nicotine dependence: Secondary | ICD-10-CM | POA: Diagnosis not present

## 2018-05-12 DIAGNOSIS — M5440 Lumbago with sciatica, unspecified side: Secondary | ICD-10-CM | POA: Insufficient documentation

## 2018-05-12 DIAGNOSIS — M549 Dorsalgia, unspecified: Secondary | ICD-10-CM | POA: Diagnosis present

## 2018-05-12 DIAGNOSIS — J441 Chronic obstructive pulmonary disease with (acute) exacerbation: Secondary | ICD-10-CM | POA: Diagnosis not present

## 2018-05-12 LAB — COMPREHENSIVE METABOLIC PANEL
ALK PHOS: 145 U/L — AB (ref 38–126)
ALT: 9 U/L (ref 0–44)
AST: 26 U/L (ref 15–41)
Albumin: 3 g/dL — ABNORMAL LOW (ref 3.5–5.0)
Anion gap: 9 (ref 5–15)
BILIRUBIN TOTAL: 1.9 mg/dL — AB (ref 0.3–1.2)
BUN: 11 mg/dL (ref 8–23)
CALCIUM: 8.7 mg/dL — AB (ref 8.9–10.3)
CO2: 23 mmol/L (ref 22–32)
CREATININE: 0.94 mg/dL (ref 0.61–1.24)
Chloride: 108 mmol/L (ref 98–111)
Glucose, Bld: 93 mg/dL (ref 70–99)
Potassium: 5.5 mmol/L — ABNORMAL HIGH (ref 3.5–5.1)
Sodium: 140 mmol/L (ref 135–145)
TOTAL PROTEIN: 6.5 g/dL (ref 6.5–8.1)

## 2018-05-12 LAB — CBC WITH DIFFERENTIAL/PLATELET
ABS IMMATURE GRANULOCYTES: 0.01 10*3/uL (ref 0.00–0.07)
Basophils Absolute: 0 10*3/uL (ref 0.0–0.1)
Basophils Relative: 0 %
EOS ABS: 0.1 10*3/uL (ref 0.0–0.5)
Eosinophils Relative: 1 %
HEMATOCRIT: 37.8 % — AB (ref 39.0–52.0)
Hemoglobin: 11.8 g/dL — ABNORMAL LOW (ref 13.0–17.0)
IMMATURE GRANULOCYTES: 0 %
LYMPHS ABS: 1.2 10*3/uL (ref 0.7–4.0)
Lymphocytes Relative: 20 %
MCH: 28.4 pg (ref 26.0–34.0)
MCHC: 31.2 g/dL (ref 30.0–36.0)
MCV: 90.9 fL (ref 80.0–100.0)
MONO ABS: 0.6 10*3/uL (ref 0.1–1.0)
MONOS PCT: 10 %
NEUTROS ABS: 4.2 10*3/uL (ref 1.7–7.7)
NEUTROS PCT: 69 %
NRBC: 0 % (ref 0.0–0.2)
Platelets: 265 10*3/uL (ref 150–400)
RBC: 4.16 MIL/uL — ABNORMAL LOW (ref 4.22–5.81)
RDW: 14.2 % (ref 11.5–15.5)
WBC: 6 10*3/uL (ref 4.0–10.5)

## 2018-05-12 LAB — I-STAT CHEM 8, ED
BUN: 13 mg/dL (ref 8–23)
BUN: 10 mg/dL (ref 8–23)
CALCIUM ION: 1.04 mmol/L — AB (ref 1.15–1.40)
CHLORIDE: 105 mmol/L (ref 98–111)
CREATININE: 0.8 mg/dL (ref 0.61–1.24)
Calcium, Ion: 1.18 mmol/L (ref 1.15–1.40)
Chloride: 109 mmol/L (ref 98–111)
Creatinine, Ser: 0.9 mg/dL (ref 0.61–1.24)
GLUCOSE: 88 mg/dL (ref 70–99)
Glucose, Bld: 90 mg/dL (ref 70–99)
HCT: 37 % — ABNORMAL LOW (ref 39.0–52.0)
HEMATOCRIT: 37 % — AB (ref 39.0–52.0)
HEMOGLOBIN: 12.6 g/dL — AB (ref 13.0–17.0)
Hemoglobin: 12.6 g/dL — ABNORMAL LOW (ref 13.0–17.0)
POTASSIUM: 3.9 mmol/L (ref 3.5–5.1)
Potassium: 6 mmol/L — ABNORMAL HIGH (ref 3.5–5.1)
SODIUM: 140 mmol/L (ref 135–145)
Sodium: 141 mmol/L (ref 135–145)
TCO2: 27 mmol/L (ref 22–32)
TCO2: 26 mmol/L (ref 22–32)

## 2018-05-12 LAB — URINALYSIS, ROUTINE W REFLEX MICROSCOPIC
Bilirubin Urine: NEGATIVE
Glucose, UA: NEGATIVE mg/dL
HGB URINE DIPSTICK: NEGATIVE
KETONES UR: NEGATIVE mg/dL
Leukocytes, UA: NEGATIVE
NITRITE: NEGATIVE
Protein, ur: NEGATIVE mg/dL
Specific Gravity, Urine: 1.013 (ref 1.005–1.030)
pH: 7 (ref 5.0–8.0)

## 2018-05-12 MED ORDER — SODIUM CHLORIDE 0.9 % IV BOLUS
500.0000 mL | Freq: Once | INTRAVENOUS | Status: AC
  Administered 2018-05-12: 500 mL via INTRAVENOUS

## 2018-05-12 MED ORDER — GADOBUTROL 1 MMOL/ML IV SOLN
7.0000 mL | Freq: Once | INTRAVENOUS | Status: AC | PRN
  Administered 2018-05-12: 7 mL via INTRAVENOUS

## 2018-05-12 MED ORDER — IOHEXOL 300 MG/ML  SOLN
100.0000 mL | Freq: Once | INTRAMUSCULAR | Status: AC | PRN
  Administered 2018-05-12: 100 mL via INTRAVENOUS

## 2018-05-12 MED ORDER — FENTANYL CITRATE (PF) 100 MCG/2ML IJ SOLN
25.0000 ug | Freq: Once | INTRAMUSCULAR | Status: AC
  Administered 2018-05-12: 25 ug via INTRAVENOUS
  Filled 2018-05-12: qty 2

## 2018-05-12 MED ORDER — MORPHINE SULFATE (PF) 4 MG/ML IV SOLN
4.0000 mg | Freq: Once | INTRAVENOUS | Status: AC
  Administered 2018-05-12: 4 mg via INTRAVENOUS
  Filled 2018-05-12: qty 1

## 2018-05-12 NOTE — ED Notes (Signed)
Ortho tech at bedside 

## 2018-05-12 NOTE — ED Notes (Signed)
Lab called CBC clotted.  Need to reorder and redraw.

## 2018-05-12 NOTE — ED Notes (Signed)
Unsuccessful IV attempt x2. Other RN attempting to obtain IV access and blood work at this time

## 2018-05-12 NOTE — ED Notes (Signed)
MRI called to advise will send for transport.

## 2018-05-12 NOTE — ED Notes (Signed)
Ortho tech has called for TLSO brace, waiting to hear back from rep.

## 2018-05-12 NOTE — ED Provider Notes (Signed)
Patient with fall from bed on Wednesday.  Complains of low back pain.  Complains of difficulty ambulating.  CT shows T12 compression fracture.  This is further confirmed on MRI, but there is some surrounding edema.  I discussed these results with neurosurgery, who does not believe the symptoms to be infectious, and are likely trauma related.  Patient was placed in a TLSO brace.  He has still been unable to ambulate.  I do not feel that the patient necessarily requires admission to the hospital, but may benefit from PT/OT evaluation in the morning and possible placement at a rehabilitation center.  Will consult case management.  In the meantime, patient will board in the ED.  Hyperkalemia resolved.   Roxy Horseman, PA-C 05/12/18 [id number]    Tegeler, Canary Brim, MD 05/13/18 [phone]

## 2018-05-12 NOTE — ED Triage Notes (Signed)
Pt arrives to ED from home with complaints of lower back pain radiating down bilateral legs since several days ago. EMS reports per home health, patient is ambulatory without assistance. Patient has hx of chronic lower back pain but states that today is worse than usual.  Pt placed in position of comfort with bed locked and lowered, call bell in reach.

## 2018-05-12 NOTE — ED Provider Notes (Signed)
[facility] EMERGENCY DEPARTMENT Provider Note   CSN: [id number] Arrival date & time: 05/12/18  1702     History   Chief Complaint Chief Complaint  Patient presents with  . Back Pain    HPI Norman Davis is a 78 y.o. male who presents with back pain. 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 He is somewhat of a poor historian. He states that he's had gradually worsening back pain for the past week. The pain is over the lower back and radiates down both legs. No numbness or tingling. He's been taking Tylenol without relief. He normally ambulates with a walker at home and lives with his sister but has been unable to ambulate since Wednesday. He reports fecal incontinence this past week. He also had a fall out of bed on Wednesday. No fever, syncope, trauma, unexplained weight loss, hx of cancer, loss of bowel/bladder function, saddle anesthesia, urinary retention, IVDU. He has had back surgery in the past because of an accident where he was hurt by a trailer 20 years ago. He was admitted for discitis/ostoemyelitis of the lumbar spine about a year ago and had to have a phlegmon drained.    HPI  Past Medical History:  Diagnosis Date  . Cholelithiasis   . Fall 07/22/2015  . GERD (gastroesophageal reflux disease)   . H/O urinary retention   . Hypercholesteremia   . Hypertension   . Nephrolithiasis    right  . 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 Schizophrenia (HCC)   . Shortness of breath dyspnea   . Small bowel obstruction due to adhesions [facility])     Patient Active Problem List   Diagnosis Date Noted  . Altered mental state 05/24/2017  . Altered mental status   . TIA (transient ischemic attack) 05/23/2017  . 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 Cerebrovascular accident (CVA) (HCC)   . Discitis of lumbar region  04/12/2017  . Tachycardia 04/12/2017  . Spinal stenosis 08/07/2015  . Absence of bladder continence 08/07/2015  . Pulmonary emphysema (HCC) 08/07/2015  . Essential hypertension 08/07/2015  . Depression, major, recurrent, moderate (HCC) 07/30/2015    Class: Chronic  . GERD without esophagitis 07/28/2015  . BPH (benign prostatic hyperplasia) 07/28/2015  . UI (urinary incontinence) 07/28/2015  . Fall 07/22/2015  . HLD (hyperlipidemia) 07/22/2015  . Depression 07/22/2015  . Rhabdomyolysis 07/22/2015  . Hypokalemia 07/22/2015  . Elevated CK   . COPD exacerbation (HCC) 06/12/2015  . Elevated troponin 06/12/2015  . Elevated d-dimer   . Leg pain, left 04/28/2015  . Chest pain 04/28/2015  . NSTEMI (non-ST elevated myocardial infarction) (HCC) 04/28/2015  . Abdominal pain 04/28/2015  . COPD (chronic obstructive pulmonary disease) (HCC) 04/11/2013  . Hypercholesteremia   . Hypertension   . H/O urinary retention   . Myocardial infarction (HCC)   . Gallstones 03/17/2013  . Renal stone 03/17/2013  . SBO (small bowel obstruction) (HCC) 03/16/2013  . AKI (acute kidney injury) (HCC) 03/16/2013  . High anion gap metabolic acidosis 03/16/2013  . 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) 03/16/2013    Past Surgical History:  Procedure Laterality Date  . BACK SURGERY    . IR LUMBAR DISC ASPIRATION W/IMG GUIDE  04/13/2017  . LIVER SURGERY    . PELVIC FRACTURE SURGERY    . TOTAL HIP ARTHROPLASTY  Home Medications    Prior to Admission medications   Medication Sig Start Date End Date Taking? Authorizing Provider  acetaminophen (TYLENOL) 325 MG tablet Take 2 tablets (650 mg total) by mouth every 6 (six) hours as needed for mild pain (or Fever >/= 101). 04/20/17   Mikhail, Nita Sells, DO  albuterol (PROVENTIL HFA;VENTOLIN HFA) 108 (90 BASE) MCG/ACT inhaler Inhale 2 puffs into the lungs every 4 (four) hours as needed for wheezing or shortness of breath.    [provider]  Amino  Acids-Protein Hydrolys (FEEDING SUPPLEMENT, PRO-STAT SUGAR FREE 64,) LIQD Take 30 mLs by mouth 2 (two) times daily. Patient not taking: Reported on 05/23/2017 04/20/17   Edsel Petrin, DO  amLODipine (NORVASC) 10 MG tablet Take 10 mg by mouth daily.    [provider]  Chlorhexidine Gluconate Cloth 2 % PADS Apply 6 each topically daily. 04/20/17   Edsel Petrin, DO  Cholecalciferol (VITAMIN D) 2000 units tablet Take 2,000 Units by mouth daily.    [provider]  clopidogrel (PLAVIX) 75 MG tablet Take 75 mg by mouth daily with breakfast.    [provider]  doxazosin (CARDURA) 8 MG tablet Take 8 mg by mouth at bedtime.    [provider]  ENSURE PLUS (ENSURE PLUS) LIQD Take 237 mLs by mouth 3 (three) times daily between meals.    [provider]  finasteride (PROSCAR) 5 MG tablet Take 5 mg by mouth daily.    [provider]  FLUoxetine (PROZAC) 20 MG capsule Take 40 mg by mouth daily.     [provider]  fluticasone (FLONASE) 50 MCG/ACT nasal spray Place 2 sprays into both nostrils at bedtime as needed for allergies or rhinitis.     [provider]  folic acid (FOLVITE) 1 MG tablet Take 1 mg by mouth daily.    [provider]  losartan (COZAAR) 50 MG tablet Take 75 mg by mouth daily.     [provider]  Melatonin 3 MG CAPS Take 1 capsule by mouth at bedtime.    [provider]  Mirabegron (MYRBETRIQ PO) Take 25 mg by mouth daily.     [provider]  mirtazapine (REMERON) 30 MG tablet Take 30 mg by mouth at bedtime.    [provider]  mouth rinse LIQD solution 15 mLs by Mouth Rinse route 2 (two) times daily. Patient taking differently: 15 mLs by Mouth Rinse route daily.  04/20/17   Edsel Petrin, DO  Paliperidone Palmitate 546 MG/1.75ML SUSP Inject 546 mg into the muscle every 3 (three) months.    [provider]  polyethylene glycol (MIRALAX / GLYCOLAX) packet  Take 17 g by mouth daily.     [provider]  pravastatin (PRAVACHOL) 40 MG tablet Take 40 mg by mouth at bedtime.     [provider]  senna (SENOKOT) 8.6 MG TABS tablet Take 1 tablet by mouth 2 (two) times daily.     [provider]  SPIRIVA HANDIHALER 18 MCG inhalation capsule INHALE 1 PUFF VIA HANDIHALER EVERY FOR COPD Patient not taking: Reported on 05/23/2017 08/21/15   Margit Hanks, MD    Family History Family History  Problem Relation Age of Onset  . Cancer Mother   . Cancer Father     Social History Social History   Tobacco Use  . Smoking status: Former Smoker    Packs/day: 1.00    Types: Cigarettes    Last attempt to quit: 05/02/2000  Years since quitting: 18.0  . Smokeless tobacco: Never Used  Substance Use Topics  . Alcohol use: No  . Drug use: No     Allergies   Aspirin and Penicillins   Review of Systems Review of Systems  Constitutional: Negative for fever.  Gastrointestinal: Positive for abdominal pain.  Musculoskeletal: Positive for back pain, gait problem and myalgias. Negative for neck pain.  Skin: Negative for wound.  Allergic/Immunologic: Negative for immunocompromised state.  Neurological: Positive for weakness and headaches. Negative for numbness.  All other systems reviewed and are negative.    Physical Exam Updated Vital Signs BP (!) 155/118   Pulse 77   Temp 97.9 F (36.6 C) (Oral)   Resp 16   Ht 5\' 9"  (1.753 m)   Wt 70.3 kg   SpO2 100%   BMI 22.89 kg/m   Physical Exam Vitals signs and nursing note reviewed.  Constitutional:      General: He is not in acute distress.    Appearance: Normal appearance. He is well-developed. He is ill-appearing (chronically ill).     Comments: Debilitated elderly male in NAD  HENT:     Head: Normocephalic and atraumatic.  Eyes:     General: No scleral icterus.       Right eye: No discharge.        Left eye: No discharge.     Conjunctiva/sclera: Conjunctivae  normal.     Pupils: Pupils are equal, round, and reactive to light.  Neck:     Musculoskeletal: Normal range of motion.  Cardiovascular:     Rate and Rhythm: Normal rate and regular rhythm.  Pulmonary:     Effort: Pulmonary effort is normal. No respiratory distress.     Breath sounds: Normal breath sounds.  Abdominal:     General: Abdomen is flat. There is no distension.     Tenderness: There is abdominal tenderness (diffuse, mild).  Genitourinary:    Comments: Rectal: Normal rectal tone Musculoskeletal:     Comments: Back: Inspection: No masses, deformity, or rash Palpation: Diffuse tenderness along the thoracic and lumbar spine Strength: Able to lift bilateral legs off stretcher independently Sensation: Intact sensation with light touch in lower extremities bilaterally Reflexes: Patellar reflex is 1+ bilaterally  Skin:    General: Skin is warm and dry.  Neurological:     Mental Status: He is alert and oriented to person, place, and time.  Psychiatric:        Behavior: Behavior normal.      ED Treatments / Results  Labs (all labs ordered are listed, but only abnormal results are displayed) Labs Reviewed  COMPREHENSIVE METABOLIC PANEL - Abnormal; Notable for the following components:      Result Value   Potassium 5.5 (*)    Calcium 8.7 (*)    Albumin 3.0 (*)    Alkaline Phosphatase 145 (*)    Total Bilirubin 1.9 (*)    All other components within normal limits  URINALYSIS, ROUTINE W REFLEX MICROSCOPIC - Abnormal; Notable for the following components:   Color, Urine STRAW (*)    All other components within normal limits  CBC WITH DIFFERENTIAL/PLATELET - Abnormal; Notable for the following components:   RBC 4.16 (*)    Hemoglobin 11.8 (*)    HCT 37.8 (*)    All other components within normal limits  I-STAT CHEM 8, ED - Abnormal; Notable for the following components:   Potassium 6.0 (*)    Calcium, Ion 1.04 (*)    Hemoglobin  12.6 (*)    HCT 37.0 (*)    All other  components within normal limits  CBC WITH DIFFERENTIAL/PLATELET  I-STAT CHEM 8, ED    EKG EKG Interpretation  Date/Time:  Saturday May 12 2018 18:25:11 EST Ventricular Rate:  65 PR Interval:  176 QRS Duration: 154 QT Interval:  468 QTC Calculation: 486 R Axis:   -72 Text Interpretation:  Normal sinus rhythm Right bundle branch block Left anterior fascicular block. Bifascicular block  Abnormal ECG similar pattern to prior 11/19 Confirmed by Meridee ScoreButler, Michael [phone]) on 05/12/2018 6:28:25 PM   Radiology Ct Abdomen Pelvis W Contrast  Result Date: 05/12/2018 CLINICAL DATA:  Low back pain radiating down both legs for several days. Right mid abdominal pain. EXAM: CT ABDOMEN AND PELVIS WITH CONTRAST TECHNIQUE: Multidetector CT imaging of the abdomen and pelvis was performed using the standard protocol following bolus administration of intravenous contrast. CONTRAST:  100mL OMNIPAQUE IOHEXOL 300 MG/ML  SOLN COMPARISON:  07/22/2015 FINDINGS: Lower chest: Emphysematous changes and scarring in the lung bases. Mild pleural thickening in the left costophrenic angle. Hepatobiliary: Gallbladder is not visualized, either contracted or surgically absent. Small pneumobilia, likely postoperative. Probable partial left hepatectomy. Small subcentimeter cyst adjacent to the margin of resection. Pancreas: Unremarkable. No pancreatic ductal dilatation or surrounding inflammatory changes. Spleen: Normal in size without focal abnormality. Adrenals/Urinary Tract: No adrenal gland nodules. Renal nephrograms are symmetrical. Stones in the lower pole of both kidneys without evidence of obstruction. No hydronephrosis or hydroureter. Bladder is unremarkable. Stomach/Bowel: Stomach is decompressed. Small bowel and colon are also mostly decompressed. Scattered stool throughout the colon. No wall thickening or inflammatory changes identified. Appendix is not visualized. Vascular/Lymphatic: Scattered calcifications in the aorta.  No aneurysm. Inferior vena caval filter present. Reproductive: Prostate is unremarkable. Other: No abdominal wall hernia or abnormality. No abdominopelvic ascites. Musculoskeletal: Postoperative changes with posterior rod and screw fixation at the lumbosacral interspace and left hemipelvis. Coarsening of trabecula throughout the pelvis, right hip, and lower lumbar vertebrae likely representing Paget's disease. Compression of the superior endplate of T12 with linear lucency and anterior cortical irregularity suggesting an acute compression fracture. IMPRESSION: 1. Emphysematous changes and scarring in the lung bases. Mild pleural thickening in the left costophrenic angle. 2. Postoperative changes with partial left hepatectomy and probable cholecystectomy. 3. Bilateral nonobstructing renal stones. 4. Paget's disease of the pelvis, right hip, and lower lumbar vertebrae. 5. Compression fracture of the superior endplate of T12 with linear lucency and anterior cortical irregularity suggesting an acute fracture. Aortic Atherosclerosis (ICD10-I70.0) and Emphysema (ICD10-J43.9). Electronically Signed   By: Burman NievesWilliam  Stevens M.D.   On: 05/12/2018 19:32    Procedures Procedures (including critical care time)  Medications Ordered in ED Medications  morphine 4 MG/ML injection 4 mg (4 mg Intravenous Given 05/12/18 1811)  sodium chloride 0.9 % bolus 500 mL (0 mLs Intravenous Stopped 05/12/18 1936)  iohexol (OMNIPAQUE) 300 MG/ML solution 100 mL (100 mLs Intravenous Contrast Given 05/12/18 1858)  fentaNYL (SUBLIMAZE) injection 25 mcg (25 mcg Intravenous Given 05/12/18 2025)  gadobutrol (GADAVIST) 1 MMOL/ML injection 7 mL (7 mLs Intravenous Contrast Given 05/12/18 2145)     Initial Impression / Assessment and Plan / ED Course  I have reviewed the triage vital signs and the nursing notes.  Pertinent labs & imaging results that were available during my care of the patient were reviewed by me and considered in my medical  decision making (see chart for details).  Clinical Course as of May 12 1740  Sat May 12, 2018  [id number] 78 year old male with history of back pain and history of osteomyelitis discitis here with worsening of his back pain since Wednesday.  He says he is usually ambulatory with a walker but has been able to walk since then.  Is also said he can feel himself when he stools.  He also noticed he has had some diffuse abdominal pain although no nausea or vomiting or fevers.  He is diffusely tender throughout his abdomen without any masses guarding or rebound.  Hypertensive here.  Need to review his prior records as he is had some complicated back history in the past and he will likely need some repeat imaging.   [MB]    Clinical Course User Index [MB] Terrilee FilesButler, Michael C, MD    78 year old male presents with low back pain that radiates to his legs along with weakness. He is hypertensive but otherwise vitals are normal. On exam he has diffuse back tenderness. He is able to lift his legs off the stretcher independently. No paresthesias noted and he has normal rectal tone although has been having fecal incontinence. Due to his complicated history, will obtain MR of thoracic and lumbar spine as well as CT abdomen/pelvis due to his abdominal pain. Labs, UA, imaging, fluids, and pain control ordered.   6:20PM Chem 8 results are remarkable for K of 6. EKG was obtained which is NSR with RBBB. CMP is remarkable for K of 5.5. Will order fluids and repeat. There has been some delay in obtaining IV access.  7:30PM CT abdomen/pelvis is negative for any acute pathology in the abdomen but does show an acute T12 fracture which could be the cause of his symptoms. TLSO brace ordered and additional pain medicine as he did not get relief with morphine. MRI is still pending. Will repeat Chem 8 after fluids.  10PM Pt in MRI. [facility] who will dispo  Final Clinical Impressions(s) / ED Diagnoses   Final  diagnoses:  Hyperkalemia  Chronic bilateral low back pain with sciatica, sciatica laterality unspecified  Acute midline thoracic back pain    ED Discharge Orders    None       Bethel BornGekas,  Marie, PA-C 05/12/18 2205    Terrilee FilesButler, Michael C, MD 05/13/18 1022

## 2018-05-12 NOTE — ED Notes (Signed)
Delay in lab draw,  Pt currently in MRI 

## 2018-05-13 MED ORDER — HYDROCODONE-ACETAMINOPHEN 5-325 MG PO TABS
2.0000 | ORAL_TABLET | Freq: Once | ORAL | Status: AC
  Administered 2018-05-13: 2 via ORAL
  Filled 2018-05-13: qty 2

## 2018-05-13 MED ORDER — FOLIC ACID 1 MG PO TABS: 1.0000 mg | ORAL_TABLET | Freq: Every day | ORAL | Status: DC

## 2018-05-13 MED ORDER — PRAVASTATIN SODIUM 40 MG PO TABS: 40.0000 mg | ORAL_TABLET | Freq: Every day | ORAL | Status: DC

## 2018-05-13 MED ORDER — ALBUTEROL SULFATE HFA 108 (90 BASE) MCG/ACT IN AERS: 2.0000 | INHALATION_SPRAY | RESPIRATORY_TRACT | Status: DC | PRN

## 2018-05-13 MED ORDER — SENNA 8.6 MG PO TABS
1.0000 | ORAL_TABLET | Freq: Two times a day (BID) | ORAL | Status: DC
  Administered 2018-05-13: 8.6 mg via ORAL
  Filled 2018-05-13: qty 1

## 2018-05-13 MED ORDER — FLUTICASONE PROPIONATE 50 MCG/ACT NA SUSP: 2.0000 | Freq: Every evening | NASAL | Status: DC | PRN

## 2018-05-13 MED ORDER — DOXAZOSIN MESYLATE 8 MG PO TABS: 8.0000 mg | ORAL_TABLET | Freq: Every day | ORAL | Status: DC

## 2018-05-13 MED ORDER — POLYETHYLENE GLYCOL 3350 17 G PO PACK: 17.0000 g | PACK | Freq: Every day | ORAL | Status: DC

## 2018-05-13 MED ORDER — FLUOXETINE HCL 20 MG PO CAPS
40.0000 mg | ORAL_CAPSULE | Freq: Every day | ORAL | Status: DC
  Administered 2018-05-13: 40 mg via ORAL
  Filled 2018-05-13: qty 2

## 2018-05-13 MED ORDER — MELATONIN 3 MG PO TABS: 3.0000 mg | ORAL_TABLET | Freq: Every day | ORAL | Status: DC

## 2018-05-13 MED ORDER — VITAMIN D 25 MCG (1000 UNIT) PO TABS: 2000.0000 [IU] | ORAL_TABLET | Freq: Every day | ORAL | Status: DC

## 2018-05-13 MED ORDER — AMLODIPINE BESYLATE 5 MG PO TABS
10.0000 mg | ORAL_TABLET | Freq: Every day | ORAL | Status: DC
  Administered 2018-05-13: 10 mg via ORAL
  Filled 2018-05-13: qty 2

## 2018-05-13 MED ORDER — LOSARTAN POTASSIUM 50 MG PO TABS: 75.0000 mg | ORAL_TABLET | Freq: Every day | ORAL | Status: DC

## 2018-05-13 MED ORDER — HYDROCODONE-ACETAMINOPHEN 5-325 MG PO TABS: ORAL_TABLET | ORAL | 0 refills | Status: DC

## 2018-05-13 MED ORDER — ACETAMINOPHEN 325 MG PO TABS: 650.0000 mg | ORAL_TABLET | Freq: Four times a day (QID) | ORAL | Status: DC | PRN

## 2018-05-13 MED ORDER — MIRABEGRON ER 25 MG PO TB24
25.0000 mg | ORAL_TABLET | Freq: Every day | ORAL | Status: DC
  Administered 2018-05-13: 25 mg via ORAL
  Filled 2018-05-13: qty 1

## 2018-05-13 MED ORDER — CLOPIDOGREL BISULFATE 75 MG PO TABS
75.0000 mg | ORAL_TABLET | Freq: Every day | ORAL | Status: DC
  Administered 2018-05-13: 75 mg via ORAL
  Filled 2018-05-13: qty 1

## 2018-05-13 MED ORDER — MIRTAZAPINE 30 MG PO TABS: 30.0000 mg | ORAL_TABLET | Freq: Every day | ORAL | Status: DC

## 2018-05-13 MED ORDER — FINASTERIDE 5 MG PO TABS
5.0000 mg | ORAL_TABLET | Freq: Every day | ORAL | Status: DC
  Administered 2018-05-13: 5 mg via ORAL
  Filled 2018-05-13: qty 1

## 2018-05-13 NOTE — Progress Notes (Signed)
ED CM met with patient in H-12 to discuss transitional care planning. Patient reports living with his sister Norman Davis [phone])  primary care giver. Patient states, he ambulates at home with walker and needs minimal assistance.  Patient provided CM permission to contact sister. CM contacts sister by phone, and she verifies information. Sister states, patient is active with [facility] program, and is enrolled in [facility] as well. She  CM explained that ED evaluation was completed as per EDP and patient is medically cleared for discharge. Sister states she and family are on their way here to ED and has agreed to take patient back home. Updated EDP and nurse on [place]. No further ED CM needs identified

## 2018-05-13 NOTE — ED Notes (Signed)
Bladder scanner found 

## 2018-05-13 NOTE — ED Notes (Signed)
Patient verbalizes understanding of discharge instructions. Opportunity for questioning and answers were provided. Armband removed by staff, pt discharged from ED. Wheeled out to lobby with family  

## 2018-05-13 NOTE — ED Notes (Signed)
Bfast ordered heart healthy  

## 2018-05-13 NOTE — ED Provider Notes (Signed)
6:35 AM Handoff from [facility] at 6:30am.   Pt with spine fracture after fall. H/o spine infection however current symptoms not consistent with this. On work-up, K was elevated initially and rechecked after fluids -- improved to 3.9.   Continuing pain control with oral meds. Pt in TLSO brace. Plan for case management consult to address home needs.   BP (!) 152/98 (BP Location: Left Arm)   Pulse 84   Temp 97.9 F (36.6 C) (Oral)   Resp 16   Ht 5\' 9"  (1.753 m)   Wt 70.3 kg   SpO2 95%   BMI 22.89 kg/m   8:52 AM Pt seen. C/o pain. He seemed to do well with oral meds overnight, will redose. Now eating breakfast.   1:35 PM patient has been seen by case manager who has spoke with the patient's daughter.  He has home health support.  Daughter seems to be comfortable with coming in taking the patient home and caring for him.  Plan for discharge.     Renne Crigler, PA-C 05/13/18 1336    Virgina Norfolk, DO 05/13/18 1604

## 2021-05-04 ENCOUNTER — Encounter (HOSPITAL_COMMUNITY): Payer: Self-pay

## 2021-05-04 ENCOUNTER — Inpatient Hospital Stay (HOSPITAL_COMMUNITY): Payer: Medicare Other

## 2021-05-04 ENCOUNTER — Inpatient Hospital Stay (HOSPITAL_COMMUNITY)
Admission: EM | Admit: 2021-05-04 | Discharge: 2021-05-10 | DRG: 682 | Disposition: A | Discharge status: Home / Self Care | Payer: Medicare Other | Attending: Internal Medicine | Admitting: Internal Medicine

## 2021-05-04 ENCOUNTER — Emergency Department (HOSPITAL_COMMUNITY): Payer: Medicare Other

## 2021-05-04 ENCOUNTER — Other Ambulatory Visit: Payer: Self-pay

## 2021-05-04 DIAGNOSIS — Z87891 Personal history of nicotine dependence: Secondary | ICD-10-CM

## 2021-05-04 DIAGNOSIS — E7800 Pure hypercholesterolemia, unspecified: Secondary | ICD-10-CM | POA: Diagnosis present

## 2021-05-04 DIAGNOSIS — R4182 Altered mental status, unspecified: Secondary | ICD-10-CM

## 2021-05-04 DIAGNOSIS — Z809 Family history of malignant neoplasm, unspecified: Secondary | ICD-10-CM | POA: Diagnosis not present

## 2021-05-04 DIAGNOSIS — I10 Essential (primary) hypertension: Secondary | ICD-10-CM | POA: Diagnosis present

## 2021-05-04 DIAGNOSIS — Z95828 Presence of other vascular implants and grafts: Secondary | ICD-10-CM | POA: Diagnosis not present

## 2021-05-04 DIAGNOSIS — K565 Intestinal adhesions [bands], unspecified as to partial versus complete obstruction: Secondary | ICD-10-CM | POA: Diagnosis present

## 2021-05-04 DIAGNOSIS — M48061 Spinal stenosis, lumbar region without neurogenic claudication: Secondary | ICD-10-CM | POA: Diagnosis present

## 2021-05-04 DIAGNOSIS — N401 Enlarged prostate with lower urinary tract symptoms: Secondary | ICD-10-CM | POA: Diagnosis present

## 2021-05-04 DIAGNOSIS — W06XXXA Fall from bed, initial encounter: Secondary | ICD-10-CM | POA: Diagnosis present

## 2021-05-04 DIAGNOSIS — R531 Weakness: Secondary | ICD-10-CM

## 2021-05-04 DIAGNOSIS — E876 Hypokalemia: Secondary | ICD-10-CM | POA: Diagnosis present

## 2021-05-04 DIAGNOSIS — M889 Osteitis deformans of unspecified bone: Secondary | ICD-10-CM | POA: Diagnosis present

## 2021-05-04 DIAGNOSIS — F259 Schizoaffective disorder, unspecified: Secondary | ICD-10-CM | POA: Diagnosis present

## 2021-05-04 DIAGNOSIS — Z7951 Long term (current) use of inhaled steroids: Secondary | ICD-10-CM

## 2021-05-04 DIAGNOSIS — Z880 Allergy status to penicillin: Secondary | ICD-10-CM

## 2021-05-04 DIAGNOSIS — N179 Acute kidney failure, unspecified: Principal | ICD-10-CM | POA: Diagnosis present

## 2021-05-04 DIAGNOSIS — Z20822 Contact with and (suspected) exposure to covid-19: Secondary | ICD-10-CM | POA: Diagnosis present

## 2021-05-04 DIAGNOSIS — M88871 Osteitis deformans of right ankle and foot: Secondary | ICD-10-CM | POA: Diagnosis present

## 2021-05-04 DIAGNOSIS — G8929 Other chronic pain: Secondary | ICD-10-CM | POA: Diagnosis present

## 2021-05-04 DIAGNOSIS — Z96649 Presence of unspecified artificial hip joint: Secondary | ICD-10-CM | POA: Diagnosis present

## 2021-05-04 DIAGNOSIS — M5416 Radiculopathy, lumbar region: Secondary | ICD-10-CM | POA: Diagnosis present

## 2021-05-04 DIAGNOSIS — Z8673 Personal history of transient ischemic attack (TIA), and cerebral infarction without residual deficits: Secondary | ICD-10-CM

## 2021-05-04 DIAGNOSIS — R0602 Shortness of breath: Secondary | ICD-10-CM | POA: Diagnosis not present

## 2021-05-04 DIAGNOSIS — Z7902 Long term (current) use of antithrombotics/antiplatelets: Secondary | ICD-10-CM | POA: Diagnosis not present

## 2021-05-04 DIAGNOSIS — J189 Pneumonia, unspecified organism: Secondary | ICD-10-CM | POA: Diagnosis present

## 2021-05-04 DIAGNOSIS — R338 Other retention of urine: Secondary | ICD-10-CM | POA: Diagnosis present

## 2021-05-04 DIAGNOSIS — R262 Difficulty in walking, not elsewhere classified: Secondary | ICD-10-CM

## 2021-05-04 DIAGNOSIS — D649 Anemia, unspecified: Secondary | ICD-10-CM | POA: Diagnosis present

## 2021-05-04 DIAGNOSIS — I6381 Other cerebral infarction due to occlusion or stenosis of small artery: Secondary | ICD-10-CM | POA: Diagnosis present

## 2021-05-04 DIAGNOSIS — R3912 Poor urinary stream: Secondary | ICD-10-CM | POA: Diagnosis present

## 2021-05-04 DIAGNOSIS — Z79899 Other long term (current) drug therapy: Secondary | ICD-10-CM | POA: Diagnosis not present

## 2021-05-04 DIAGNOSIS — R296 Repeated falls: Secondary | ICD-10-CM | POA: Diagnosis present

## 2021-05-04 DIAGNOSIS — M5431 Sciatica, right side: Secondary | ICD-10-CM | POA: Diagnosis present

## 2021-05-04 DIAGNOSIS — Z981 Arthrodesis status: Secondary | ICD-10-CM

## 2021-05-04 DIAGNOSIS — Z886 Allergy status to analgesic agent status: Secondary | ICD-10-CM | POA: Diagnosis not present

## 2021-05-04 DIAGNOSIS — I6389 Other cerebral infarction: Secondary | ICD-10-CM | POA: Diagnosis not present

## 2021-05-04 DIAGNOSIS — Z87442 Personal history of urinary calculi: Secondary | ICD-10-CM | POA: Diagnosis not present

## 2021-05-04 DIAGNOSIS — K219 Gastro-esophageal reflux disease without esophagitis: Secondary | ICD-10-CM | POA: Diagnosis present

## 2021-05-04 LAB — URINALYSIS, ROUTINE W REFLEX MICROSCOPIC
Bilirubin Urine: NEGATIVE
Glucose, UA: NEGATIVE mg/dL
Hgb urine dipstick: NEGATIVE
Ketones, ur: NEGATIVE mg/dL
Leukocytes,Ua: NEGATIVE
Nitrite: NEGATIVE
Protein, ur: NEGATIVE mg/dL
Specific Gravity, Urine: 1.015 (ref 1.005–1.030)
pH: 7 (ref 5.0–8.0)

## 2021-05-04 LAB — COMPREHENSIVE METABOLIC PANEL
ALT: 10 U/L (ref 0–44)
AST: 15 U/L (ref 15–41)
Albumin: 3.7 g/dL (ref 3.5–5.0)
Alkaline Phosphatase: 180 U/L — ABNORMAL HIGH (ref 38–126)
Anion gap: 9 (ref 5–15)
BUN: 18 mg/dL (ref 8–23)
CO2: 26 mmol/L (ref 22–32)
Calcium: 8.9 mg/dL (ref 8.9–10.3)
Chloride: 108 mmol/L (ref 98–111)
Creatinine, Ser: 1.64 mg/dL — ABNORMAL HIGH (ref 0.61–1.24)
GFR, Estimated: 42 mL/min — ABNORMAL LOW (ref >60)
Glucose, Bld: 97 mg/dL (ref 70–99)
Potassium: 3.6 mmol/L (ref 3.5–5.1)
Sodium: 143 mmol/L (ref 135–145)
Total Bilirubin: 1 mg/dL (ref 0.3–1.2)
Total Protein: 7.2 g/dL (ref 6.5–8.1)

## 2021-05-04 LAB — CREATININE, URINE, RANDOM: Creatinine, Urine: 84.81 mg/dL

## 2021-05-04 LAB — CBC WITH DIFFERENTIAL/PLATELET
Abs Immature Granulocytes: 0.03 10*3/uL (ref 0.00–0.07)
Basophils Absolute: 0 10*3/uL (ref 0.0–0.1)
Basophils Relative: 0 %
Eosinophils Absolute: 0.1 10*3/uL (ref 0.0–0.5)
Eosinophils Relative: 1 %
HCT: 37.6 % — ABNORMAL LOW (ref 39.0–52.0)
Hemoglobin: 12.1 g/dL — ABNORMAL LOW (ref 13.0–17.0)
Immature Granulocytes: 0 %
Lymphocytes Relative: 8 %
Lymphs Abs: 0.7 10*3/uL (ref 0.7–4.0)
MCH: 28.3 pg (ref 26.0–34.0)
MCHC: 32.2 g/dL (ref 30.0–36.0)
MCV: 87.9 fL (ref 80.0–100.0)
Monocytes Absolute: 0.7 10*3/uL (ref 0.1–1.0)
Monocytes Relative: 9 %
Neutro Abs: 6.9 10*3/uL (ref 1.7–7.7)
Neutrophils Relative %: 82 %
Platelets: 170 10*3/uL (ref 150–400)
RBC: 4.28 MIL/uL (ref 4.22–5.81)
RDW: 15.7 % — ABNORMAL HIGH (ref 11.5–15.5)
WBC: 8.5 10*3/uL (ref 4.0–10.5)
nRBC: 0 % (ref 0.0–0.2)

## 2021-05-04 LAB — SODIUM, URINE, RANDOM: Sodium, Ur: 204 mmol/L

## 2021-05-04 LAB — CK: Total CK: 104 U/L (ref 49–397)

## 2021-05-04 LAB — BRAIN NATRIURETIC PEPTIDE: B Natriuretic Peptide: 76.2 pg/mL (ref 0.0–100.0)

## 2021-05-04 LAB — MAGNESIUM: Magnesium: 1.7 mg/dL (ref 1.7–2.4)

## 2021-05-04 LAB — PROCALCITONIN: Procalcitonin: <0.1 ng/mL

## 2021-05-04 MED ORDER — OXYCODONE HCL 5 MG PO TABS
5.0000 mg | ORAL_TABLET | ORAL | Status: DC | PRN
  Administered 2021-05-05 – 2021-05-09 (×8): 5 mg via ORAL
  Filled 2021-05-04 (×8): qty 1

## 2021-05-04 MED ORDER — ONDANSETRON HCL 4 MG/2ML IJ SOLN: 4.0000 mg | Freq: Four times a day (QID) | INTRAMUSCULAR | Status: DC | PRN

## 2021-05-04 MED ORDER — ACETAMINOPHEN 325 MG PO TABS
650.0000 mg | ORAL_TABLET | Freq: Once | ORAL | Status: AC
  Administered 2021-05-04: 650 mg via ORAL
  Filled 2021-05-04: qty 2

## 2021-05-04 MED ORDER — SODIUM CHLORIDE 0.9 % IV SOLN
1.0000 g | Freq: Once | INTRAVENOUS | Status: AC
  Administered 2021-05-04: 1 g via INTRAVENOUS
  Filled 2021-05-04: qty 10

## 2021-05-04 MED ORDER — FLUOXETINE HCL 20 MG PO CAPS
20.0000 mg | ORAL_CAPSULE | Freq: Every day | ORAL | Status: DC
  Administered 2021-05-05 – 2021-05-10 (×6): 20 mg via ORAL
  Filled 2021-05-04 (×6): qty 1

## 2021-05-04 MED ORDER — FINASTERIDE 5 MG PO TABS
5.0000 mg | ORAL_TABLET | Freq: Every day | ORAL | Status: DC
  Administered 2021-05-05 – 2021-05-10 (×6): 5 mg via ORAL
  Filled 2021-05-04 (×6): qty 1

## 2021-05-04 MED ORDER — MIRTAZAPINE 15 MG PO TABS
30.0000 mg | ORAL_TABLET | Freq: Every day | ORAL | Status: DC
  Administered 2021-05-04 – 2021-05-09 (×6): 30 mg via ORAL
  Filled 2021-05-04 (×4): qty 2
  Filled 2021-05-04 (×2): qty 1
  Filled 2021-05-04: qty 2

## 2021-05-04 MED ORDER — DOXYCYCLINE HYCLATE 100 MG PO TABS
100.0000 mg | ORAL_TABLET | Freq: Two times a day (BID) | ORAL | Status: AC
  Administered 2021-05-05 – 2021-05-10 (×10): 100 mg via ORAL
  Filled 2021-05-04 (×10): qty 1

## 2021-05-04 MED ORDER — IPRATROPIUM-ALBUTEROL 0.5-2.5 (3) MG/3ML IN SOLN
3.0000 mL | Freq: Once | RESPIRATORY_TRACT | Status: AC
  Administered 2021-05-04: 3 mL via RESPIRATORY_TRACT
  Filled 2021-05-04: qty 3

## 2021-05-04 MED ORDER — MIRABEGRON ER 25 MG PO TB24
25.0000 mg | ORAL_TABLET | Freq: Every day | ORAL | Status: DC
  Administered 2021-05-04 – 2021-05-10 (×6): 25 mg via ORAL
  Filled 2021-05-04 (×7): qty 1

## 2021-05-04 MED ORDER — ONDANSETRON HCL 4 MG PO TABS: 4.0000 mg | ORAL_TABLET | Freq: Four times a day (QID) | ORAL | Status: DC | PRN

## 2021-05-04 MED ORDER — ATORVASTATIN CALCIUM 40 MG PO TABS
40.0000 mg | ORAL_TABLET | Freq: Every day | ORAL | Status: DC
  Administered 2021-05-04 – 2021-05-09 (×6): 40 mg via ORAL
  Filled 2021-05-04 (×6): qty 1

## 2021-05-04 MED ORDER — DOXAZOSIN MESYLATE 2 MG PO TABS
8.0000 mg | ORAL_TABLET | Freq: Every day | ORAL | Status: DC
  Administered 2021-05-04 – 2021-05-09 (×6): 8 mg via ORAL
  Filled 2021-05-04: qty 4
  Filled 2021-05-04 (×2): qty 1
  Filled 2021-05-04 (×4): qty 4

## 2021-05-04 MED ORDER — ACETAMINOPHEN 325 MG PO TABS: 650.0000 mg | ORAL_TABLET | Freq: Four times a day (QID) | ORAL | Status: DC | PRN

## 2021-05-04 MED ORDER — CLOPIDOGREL BISULFATE 75 MG PO TABS
75.0000 mg | ORAL_TABLET | Freq: Every day | ORAL | Status: DC
  Administered 2021-05-05 – 2021-05-10 (×6): 75 mg via ORAL
  Filled 2021-05-04 (×6): qty 1

## 2021-05-04 MED ORDER — AMLODIPINE BESYLATE 10 MG PO TABS
10.0000 mg | ORAL_TABLET | Freq: Every day | ORAL | Status: DC
  Administered 2021-05-05 – 2021-05-10 (×6): 10 mg via ORAL
  Filled 2021-05-04 (×4): qty 1
  Filled 2021-05-04: qty 2
  Filled 2021-05-04: qty 1

## 2021-05-04 MED ORDER — DOXYCYCLINE HYCLATE 100 MG PO TABS
100.0000 mg | ORAL_TABLET | Freq: Once | ORAL | Status: AC
  Administered 2021-05-04: 100 mg via ORAL
  Filled 2021-05-04: qty 1

## 2021-05-04 MED ORDER — FLUTICASONE PROPIONATE 50 MCG/ACT NA SUSP: 2.0000 | Freq: Every evening | NASAL | Status: DC | PRN

## 2021-05-04 MED ORDER — BISACODYL 5 MG PO TBEC: 5.0000 mg | DELAYED_RELEASE_TABLET | Freq: Every day | ORAL | Status: DC | PRN

## 2021-05-04 MED ORDER — CARVEDILOL 3.125 MG PO TABS
3.1250 mg | ORAL_TABLET | Freq: Two times a day (BID) | ORAL | Status: DC
  Administered 2021-05-04 – 2021-05-10 (×12): 3.125 mg via ORAL
  Filled 2021-05-04 (×12): qty 1

## 2021-05-04 MED ORDER — HEPARIN SODIUM (PORCINE) 5000 UNIT/ML IJ SOLN
5000.0000 [IU] | Freq: Two times a day (BID) | INTRAMUSCULAR | Status: DC
Start: 2021-05-04 — End: 2021-05-10
  Administered 2021-05-04 – 2021-05-10 (×12): 5000 [IU] via SUBCUTANEOUS
  Filled 2021-05-04 (×12): qty 1

## 2021-05-04 MED ORDER — SODIUM CHLORIDE 0.9 % IV SOLN: INTRAVENOUS | Status: AC

## 2021-05-04 MED ORDER — GADOBUTROL 1 MMOL/ML IV SOLN
7.0000 mL | Freq: Once | INTRAVENOUS | Status: AC | PRN
  Administered 2021-05-04: 7 mL via INTRAVENOUS

## 2021-05-04 MED ORDER — POLYETHYLENE GLYCOL 3350 17 G PO PACK
17.0000 g | PACK | Freq: Every day | ORAL | Status: DC
  Administered 2021-05-04 – 2021-05-07 (×4): 17 g via ORAL
  Filled 2021-05-04 (×5): qty 1

## 2021-05-04 MED ORDER — ALBUTEROL SULFATE (2.5 MG/3ML) 0.083% IN NEBU: 2.5000 mg | INHALATION_SOLUTION | RESPIRATORY_TRACT | Status: DC | PRN

## 2021-05-04 MED ORDER — MELATONIN 3 MG PO TABS
3.0000 mg | ORAL_TABLET | Freq: Every day | ORAL | Status: DC
  Administered 2021-05-04 – 2021-05-09 (×6): 3 mg via ORAL
  Filled 2021-05-04 (×6): qty 1

## 2021-05-04 MED ORDER — SODIUM CHLORIDE 0.9 % IV SOLN: 1.0000 g | INTRAVENOUS | Status: DC

## 2021-05-04 MED ORDER — FOLIC ACID 1 MG PO TABS
1.0000 mg | ORAL_TABLET | Freq: Every day | ORAL | Status: DC
  Administered 2021-05-04 – 2021-05-10 (×7): 1 mg via ORAL
  Filled 2021-05-04 (×7): qty 1

## 2021-05-04 MED ORDER — SENNA 8.6 MG PO TABS
1.0000 | ORAL_TABLET | Freq: Every day | ORAL | Status: DC
  Administered 2021-05-04 – 2021-05-07 (×4): 8.6 mg via ORAL
  Filled 2021-05-04 (×4): qty 1

## 2021-05-04 MED ORDER — ACETAMINOPHEN 650 MG RE SUPP: 650.0000 mg | Freq: Four times a day (QID) | RECTAL | Status: DC | PRN

## 2021-05-04 NOTE — ED Notes (Signed)
Benard Rink (Sister) called to check on Jarette and ask for an update when there is more info to give. [phone]

## 2021-05-04 NOTE — ED Triage Notes (Signed)
Per [facility] family reports weakness in his legs and SOB. Pt baseline altered.  Hx COPD, schizoaffective  RA 86-88% [place] 4L 98% BP 158/102 HR 94 RR 18 98.9T

## 2021-05-04 NOTE — H&P (Signed)
History and Physical    Norman Davis [id number] DOB: Feb 01, 1941 DOA: 05/04/2021  PCP: [facility], [facility] (Confirm with patient/family/NH records and if not entered, this has to be entered at [facility] point of entry) Patient coming from: Assisted living  I have personally briefly reviewed patient's old medical records in [place]  Chief Complaint: Right foot weak and fall.  HPI: Norman Davis is a 81 y.o. 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  Patient has poorly controlled sciatica with chronic back pain shooting down to lateral side of the right leg.  Increasing need for 2 to 3 weeks, patient has been using cane because new onset of right foot weakness and worsening of back and leg pain.  Today, patient had several falls due to weakness of the right leg, he denied any numbness of the right leg or right foot.  Denied any numbness weakness of the upper extremities.  No blurred vision denied any lightheadedness or chest pains.  Patient admitted that sometimes he does not take his blood pressure medications regularly.  Patient also admitted poorly controlled BPH, with weak stream and difficulty initiation.  Patient also endorsed increasing cough with yellowish sputum production over the weekend, denied any fever chills or chest pains.   ED Course: Afebrile, blood pressure mildly elevated  CT head no acute changes, small lacunar infarct within the right basal ganglia age-indeterminate  Blood work showed creatinine 1.6 compared to baseline 0.8.  Potassium 3.6, BUN 18.  Review of Systems: As per HPI otherwise 14 point review of systems negative.    Past Medical History:  Diagnosis Date   Cholelithiasis    Fall 07/22/2015   GERD  (gastroesophageal reflux disease)    H/O urinary retention    Hypercholesteremia    Hypertension    Nephrolithiasis    right   Paget's disease of bone    right femur and pelvis   Schizophrenia (HCC)    Shortness of breath dyspnea    Small bowel obstruction due to adhesions [facility])     Past Surgical History:  Procedure Laterality Date   BACK SURGERY     IR LUMBAR DISC ASPIRATION W/IMG GUIDE  04/13/2017   LIVER SURGERY     PELVIC FRACTURE SURGERY     TOTAL HIP ARTHROPLASTY       reports that he quit smoking about 21 years ago. His smoking use included cigarettes. He smoked an average of 1 pack per day. He has never used smokeless tobacco. He reports that he does not drink alcohol and does not use drugs.  Allergies  Allergen Reactions   Aspirin     unknown   Penicillins     Unknown Has patient had a PCN reaction causing immediate rash, facial/tongue/throat swelling, SOB or lightheadedness with hypotension: NO Has patient had a PCN reaction causing severe rash involving mucus membranes or skin necrosis: NO Has patient had a PCN reaction that required hospitalization NO Has patient had a PCN reaction occurring within the last 10 years: NO If all of the above answers are "NO", then may proceed with Cephalosporin use.    Family History  Problem Relation Age of Onset   Cancer Mother    Cancer Father      Prior to Admission medications  Medication Sig Start Date End Date Taking? Authorizing Provider  acetaminophen (TYLENOL) 325 MG tablet Take 2 tablets (650 mg total) by mouth every 6 (six) hours as needed for mild pain (or Fever >/= 101). 04/20/17  Yes Mikhail, Clinical biochemist, DO  albuterol (PROVENTIL HFA;VENTOLIN HFA) 108 (90 BASE) MCG/ACT inhaler Inhale 2 puffs into the lungs every 4 (four) hours as needed for wheezing or shortness of breath.   Yes [provider]  amLODipine (NORVASC) 10 MG tablet Take 10 mg by mouth daily.   Yes [provider]  clopidogrel  (PLAVIX) 75 MG tablet Take 75 mg by mouth daily with breakfast.   Yes [provider]  doxazosin (CARDURA) 8 MG tablet Take 8 mg by mouth at bedtime.   Yes [provider]  finasteride (PROSCAR) 5 MG tablet Take 5 mg by mouth daily.   Yes [provider]  FLUoxetine (PROZAC) 20 MG capsule Take 20 mg by mouth daily.   Yes [provider]  fluticasone (FLONASE) 50 MCG/ACT nasal spray Place 2 sprays into both nostrils at bedtime as needed for allergies or rhinitis.    Yes [provider]  losartan (COZAAR) 50 MG tablet Take 50 mg by mouth daily.   Yes [provider]  Melatonin 3 MG CAPS Take 1 capsule by mouth at bedtime.   Yes [provider]  mirtazapine (REMERON) 30 MG tablet Take 30 mg by mouth at bedtime.   Yes [provider]  polyethylene glycol (MIRALAX / GLYCOLAX) packet Take 17 g by mouth daily.   Yes [provider]  senna (SENOKOT) 8.6 MG TABS tablet Take 1 tablet by mouth daily.   Yes [provider]  Amino Acids-Protein Hydrolys (FEEDING SUPPLEMENT, PRO-STAT SUGAR FREE 64,) LIQD Take 30 mLs by mouth 2 (two) times daily. Patient not taking: Reported on 05/23/2017 04/20/17   Cristal Ford, DO  Chlorhexidine Gluconate Cloth 2 % PADS Apply 6 each topically daily. Patient not taking: Reported on 05/04/2021 04/20/17   Cristal Ford, DO  folic acid (FOLVITE) 1 MG tablet Take 1 mg by mouth daily.    [provider]  HYDROcodone-acetaminophen (NORCO/VICODIN) 5-325 MG tablet Take 1-2 tablets every 6 hours as needed for severe pain Patient not taking: Reported on 05/04/2021 05/13/18   Carlisle Cater, PA-C  Mirabegron (MYRBETRIQ PO) Take 25 mg by mouth daily.     [provider]  mouth rinse LIQD solution 15 mLs by Mouth Rinse route 2 (two) times daily. Patient not taking: Reported on 05/04/2021 04/20/17   Cristal Ford, DO  pravastatin (PRAVACHOL) 40 MG tablet Take 40 mg by mouth at  bedtime.     [provider]  SPIRIVA HANDIHALER 18 MCG inhalation capsule INHALE 1 PUFF VIA HANDIHALER EVERY FOR COPD Patient not taking: No sig reported 08/21/15   Hennie Duos, MD    Physical Exam: Vitals:   05/04/21 1330 05/04/21 1400 05/04/21 1430 05/04/21 1530  BP: (!) 156/102 (!) 157/110 (!) 156/109   Pulse: 80 80 80 72  Resp: (!) 24 16 16 20   Temp:      TempSrc:      SpO2: 95% 95% 96% 100%  Weight:      Height:        Constitutional: NAD, calm, comfortable Vitals:   05/04/21 1330 05/04/21 1400 05/04/21 1430 05/04/21 1530  BP: (!) 156/102 (!) 157/110 (!) 156/109   Pulse: 80 80 80 72  Resp: (!) 24 16 16 20   Temp:  TempSrc:      SpO2: 95% 95% 96% 100%  Weight:      Height:       Eyes: PERRL, lids and conjunctivae normal ENMT: Mucous membranes are moist. Posterior pharynx clear of any exudate or lesions.Normal dentition.  Neck: normal, supple, no masses, no thyromegaly Respiratory: clear to auscultation bilaterally, no wheezing, coarse crackles on right side. Normal respiratory effort. No accessory muscle use.  Cardiovascular: Regular rate and rhythm, no murmurs / rubs / gallops. No extremity edema. 2+ pedal pulses. No carotid bruits.  Abdomen: no tenderness, no masses palpated. No hepatosplenomegaly. Bowel sounds positive.  Musculoskeletal: no clubbing / cyanosis. No joint deformity upper and lower extremities. Good ROM, no contractures. Normal muscle tone.  Skin: no rashes, lesions, ulcers. No induration Neurologic: CN 2-12 grossly intact. Sensation intact, DTR normal. Strength 4/5 on right foot compared to the left side Psychiatric: Normal judgment and insight. Alert and oriented x 3. Normal mood.      Labs on Admission: I have personally reviewed following labs and imaging studies  CBC: Recent Labs  Lab 05/04/21 1318  WBC 8.5  NEUTROABS 6.9  HGB 12.1*  HCT 37.6*  MCV 87.9  PLT 123XX123   Basic Metabolic Panel: Recent Labs  Lab  05/04/21 1318  NA 143  K 3.6  CL 108  CO2 26  GLUCOSE 97  BUN 18  CREATININE 1.64*  CALCIUM 8.9  MG 1.7   GFR: Estimated Creatinine Clearance: 34.7 mL/min (A) (by C-G formula based on SCr of 1.64 mg/dL (H)). Liver Function Tests: Recent Labs  Lab 05/04/21 1318  AST 15  ALT 10  ALKPHOS 180*  BILITOT 1.0  PROT 7.2  ALBUMIN 3.7   No results for input(s): LIPASE, AMYLASE in the last 168 hours. No results for input(s): AMMONIA in the last 168 hours. Coagulation Profile: No results for input(s): INR, PROTIME in the last 168 hours. Cardiac Enzymes: Recent Labs  Lab 05/04/21 1318  CKTOTAL 104   BNP (last 3 results) No results for input(s): PROBNP in the last 8760 hours. HbA1C: No results for input(s): HGBA1C in the last 72 hours. CBG: No results for input(s): GLUCAP in the last 168 hours. Lipid Profile: No results for input(s): CHOL, HDL, LDLCALC, TRIG, CHOLHDL, LDLDIRECT in the last 72 hours. Thyroid Function Tests: No results for input(s): TSH, T4TOTAL, FREET4, T3FREE, THYROIDAB in the last 72 hours. Anemia Panel: No results for input(s): VITAMINB12, FOLATE, FERRITIN, TIBC, IRON, RETICCTPCT in the last 72 hours. Urine analysis:    Component Value Date/Time   COLORURINE YELLOW 05/04/2021 [place] 05/04/2021 1206   LABSPEC 1.015 05/04/2021 1206   PHURINE 7.0 05/04/2021 1206   GLUCOSEU NEGATIVE 05/04/2021 1206   HGBUR NEGATIVE 05/04/2021 1206   BILIRUBINUR NEGATIVE 05/04/2021 [place] 05/04/2021 1206   PROTEINUR NEGATIVE 05/04/2021 1206   UROBILINOGEN 1.0 03/16/2013 1602   NITRITE NEGATIVE 05/04/2021 1206   LEUKOCYTESUR NEGATIVE 05/04/2021 1206    Radiological Exams on Admission: DG Chest 1 View  Result Date: 05/04/2021 CLINICAL DATA:  [place] getting out of bed.  Shortness of breath. EXAM: CHEST  1 VIEW COMPARISON:  04/19/2017.  04/16/2017. FINDINGS: Mild cardiomegaly. Aortic atherosclerosis and tortuosity. Left lung is clear.  Some emphysematous changes at the left apex. More extensive emphysema within the right lung. Increased markings in the right lung could reflect a right pneumonia. Consider two-view chest radiography when possible. IMPRESSION: Underlying emphysema, worse on the right than the left. Increased markings  in the right lung neck could indicate early pneumonia. Consider two-view chest radiography when able. Electronically Signed   By: Nelson Chimes M.D.   On: 05/04/2021 13:01   CT Head Wo Contrast  Result Date: 05/04/2021 CLINICAL DATA:  Mental status change, unknown cause. Additional history provided: Weakness in legs, shortness of breath. EXAM: CT HEAD WITHOUT CONTRAST TECHNIQUE: Contiguous axial images were obtained from the base of the skull through the vertex without intravenous contrast. COMPARISON:  MRI brain and MRA head 05/24/2017. head CT 03/20/2018. FINDINGS: Brain: Mild generalized cerebral atrophy. Small lacunar infarct within the right caudate head, new as compared to the head CT of 03/20/2018 but otherwise age indeterminate (series 3, image 22). 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 Background moderately advanced patchy and ill-defined hypoattenuation within the cerebral white matter, nonspecific but compatible with chronic small vessel ischemic disease. There is no acute intracranial hemorrhage. No demarcated cortical infarct. No extra-axial fluid collection. No evidence of an intracranial mass. No midline shift. Vascular: No hyperdense vessel.  Atherosclerotic calcifications. Skull: Normal. Negative for fracture or focal lesion. Sinuses/Orbits: Visualized orbits show no acute finding. Complete opacification of the left frontal sinus. Frothy secretions within a posterior left ethmoid air cell. IMPRESSION: No acute intracranial hemorrhage or acute demarcated cortical infarction. Small lacunar infarct within the right basal ganglia,  new from the prior head CT of 03/20/2018 but otherwise age-indeterminate. A brain MRI may be obtained for further evaluation, as clinically warranted. Moderately advanced chronic small vessel ischemic disease, as described. Mild generalized cerebral atrophy. Paranasal sinus disease, as outlined. Electronically Signed   By: Kellie Simmering D.O.   On: 05/04/2021 12:52   DG Hip Unilat W or Wo Pelvis 2-3 Views Right  Result Date: 05/04/2021 CLINICAL DATA:  Shortness of breath.  [place] getting out of bed. EXAM: DG HIP (WITH OR WITHOUT PELVIS) 2-3V RIGHT COMPARISON:  None. 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 No evidence of acute fracture. Chronic joint space narrowing at both hip joints. Previous lumbosacral decompression and fusion and previous ORIF in the region of the left iliac bone. IVC filter is noted. IMPRESSION: No acute or traumatic finding. Chronic changes of Paget's disease. Old operative changes as above. Electronically Signed   By: Nelson Chimes M.D.   On: 05/04/2021 12:59   DG FEMUR MIN 2 VIEWS LEFT  Result Date: 05/04/2021 CLINICAL DATA:  Weakness. EXAM: LEFT FEMUR 2 VIEWS COMPARISON:  March 20, 2018. FINDINGS: There is no evidence of fracture or other focal bone lesions. Soft tissues are unremarkable. IMPRESSION: Negative. Electronically Signed   By: Marijo Conception M.D.   On: 05/04/2021 15:08    EKG: Independently reviewed.  Sinus, no acute ST changes,  Assessment/Plan Principal Problem:   AKI (acute kidney injury) ([place]) Active Problems:   Ambulatory dysfunction  (please populate well all problems here in Problem List. (For example, if patient is on BP meds at home and you resume or decide to hold them, it is a problem that needs to be her. Same for CAD, COPD, HLD and so on)  AKI vs CKD -Euvolemic,  -We will give 1 days of IV fluid recheck kidney function tomorrow. -Check renal ultrasound -Check FeNA -UA not showing signs of UTI.  Acute on chronic  ambulation dysfunction -Suspect lumbar radiculopathy/sciatica -ED ordered lumbar spine and thoracic spine MRI given baseline history of Paget's which are pending -PT evaluation  Right sided PNA -Starting to have PNA  symptoms of cough and right-sided infiltrates, check x-ray PA plus lateral, no hypoxia, de-escalate to p.o. antibiotics.  Incidental finding of right side lacunar stroke, probably subacute -Likely secondary to uncontrolled hypertension -Does not appear to be the cause of his ambulation symptoms with right foot weakness. -Brain MRI pending -Continue Plavix, patient allergic to aspirin -Change Pravastatin to atorvastatin -BP control as below -Carotid Doppler and echocardiogram.  Uncontrolled hypertension -Continue amlodipine, finasteride -Hold losartan for AKI -Start Coreg 3.125 mg twice daily and titrate.  Paget's disease -On biphosphonate.  COPD -Stable, no symptoms or signs of acute exacerbation.  DVT prophylaxis: Heparin subcu Code Status: Full code Family Communication: None at bedside Disposition Plan: Multiple complaints and the clinical presentation of AKI, pneumonia, ambulation dysfunction and a stroke, expect more than 2 midnight hospital stay. Consults called: None Admission status: Tele admit   Lequita Halt MD [facility] Pager [phone]  05/04/2021, 6:19 PM

## 2021-05-04 NOTE — ED Provider Notes (Addendum)
[facility] EMERGENCY DEPARTMENT Provider Note   CSN: [id number] Arrival date & time: 05/04/21  1141     History  No chief complaint on file.   Norman Davis is a 81 y.o. male.  HPI Patient presents for shortness of breath and generalized weakness.  He arrives from home via EMS.  Initial history per EMS is that patient has a history of COPD and schizoaffective disorder.  It is unclear if he takes medications.  His family reports that he does have cognitive decline at baseline.  Patient's family called EMS due to the patient's generalized weakness.  Patient endorses 2 recent falls, most recently today.  He denies having any subsequent pain following these falls.  When EMS arrived on scene, patient was found to have SPO2 of 86% on room air.  He is not on oxygen at baseline.  Patient was started on supplemental oxygen by nasal cannula.  He was titrated up to 4 L with good response and SPO2.  Blood pressure was mildly elevated.  Patient was afebrile.  No other interventions were given by EMS.  Patient endorses shortness of breath currently.  He denies any areas of pain or discomfort.    Home Medications Prior to Admission medications   Medication Sig Start Date End Date Taking? Authorizing Provider  acetaminophen (TYLENOL) 500 MG tablet Take 500 mg by mouth daily.   Yes [provider]  albuterol (PROVENTIL HFA;VENTOLIN HFA) 108 (90 BASE) MCG/ACT inhaler Inhale 2 puffs into the lungs every 4 (four) hours as needed for wheezing or shortness of breath.   Yes [provider]  amLODipine (NORVASC) 5 MG tablet Take 5 mg by mouth daily.   Yes [provider]  clopidogrel (PLAVIX) 75 MG tablet Take 75 mg by mouth daily with breakfast.   Yes [provider]  dorzolamidel-timolol (COSOPT) 22.3-6.8 MG/ML SOLN ophthalmic solution Place 1 drop into both eyes 2 (two) times daily.   Yes [provider]  doxazosin (CARDURA) 8 MG tablet Take 4 mg by  mouth at bedtime.   Yes [provider]  finasteride (PROSCAR) 5 MG tablet Take 5 mg by mouth daily.   Yes [provider]  FLUoxetine (PROZAC) 20 MG capsule Take 40 mg by mouth daily.   Yes [provider]  fluticasone (FLONASE) 50 MCG/ACT nasal spray Place 2 sprays into both nostrils daily.   Yes [provider]  hydrochlorothiazide (HYDRODIURIL) 25 MG tablet Take 25 mg by mouth daily.   Yes [provider]  losartan (COZAAR) 100 MG tablet Take 100 mg by mouth at bedtime.   Yes [provider]  meloxicam (MOBIC) 15 MG tablet Take 15 mg by mouth daily.   Yes [provider]  mirtazapine (REMERON) 30 MG tablet Take 30 mg by mouth at bedtime.   Yes [provider]  paliperidone (INVEGA) 3 MG 24 hr tablet Take 3 mg by mouth daily.   Yes [provider]  pantoprazole (PROTONIX) 40 MG tablet Take 40 mg by mouth 2 (two) times daily.   Yes [provider]  polyethylene glycol (MIRALAX / GLYCOLAX) packet Take 17 g by mouth daily.   Yes [provider]  potassium chloride (KLOR-CON) 20 MEQ packet Take 20 mEq by mouth every other day.   Yes [provider]  pregabalin (LYRICA) 25 MG capsule Take 25 mg by mouth at bedtime.   Yes [provider]  senna (SENOKOT) 8.6 MG TABS tablet Take 2 tablets by  mouth every other day.   Yes [provider]  Vitamin D, Ergocalciferol, (DRISDOL) 1.25 MG (50000 UNIT) CAPS capsule Take 50,000 Units by mouth every 7 (seven) days. Wednesdays   Yes [provider]  acetaminophen (TYLENOL) 325 MG tablet Take 2 tablets (650 mg total) by mouth every 6 (six) hours as needed for mild pain (or Fever >/= 101). Patient not taking: Reported on 05/04/2021 04/20/17   Cristal Ford, DO  Amino Acids-Protein Hydrolys (FEEDING SUPPLEMENT, PRO-STAT SUGAR FREE 64,) LIQD Take 30 mLs by mouth 2 (two) times daily. Patient not taking: Reported on 05/23/2017 04/20/17    Cristal Ford, DO  Chlorhexidine Gluconate Cloth 2 % PADS Apply 6 each topically daily. Patient not taking: Reported on 05/04/2021 04/20/17   Cristal Ford, DO  HYDROcodone-acetaminophen (NORCO/VICODIN) 5-325 MG tablet Take 1-2 tablets every 6 hours as needed for severe pain Patient not taking: Reported on 05/04/2021 05/13/18   Carlisle Cater, PA-C  mouth rinse LIQD solution 15 mLs by Mouth Rinse route 2 (two) times daily. Patient not taking: Reported on 05/04/2021 04/20/17   Cristal Ford, DO  SPIRIVA HANDIHALER 18 MCG inhalation capsule INHALE 1 PUFF [place] COPD Patient not taking: No sig reported 08/21/15   Hennie Duos, MD      Allergies    Gabapentin, Penicillins, Tramadol, and Aspirin    Review of Systems   Review of Systems  Constitutional:  Positive for fatigue. Negative for chills and fever.  HENT:  Negative for congestion, ear pain and sore throat.   Eyes:  Negative for pain and visual disturbance.  Respiratory:  Positive for chest tightness and shortness of breath. Negative for cough.   Cardiovascular:  Negative for chest pain, palpitations and leg swelling.  Gastrointestinal:  Negative for abdominal pain, diarrhea, nausea and vomiting.  Genitourinary:  Negative for dysuria, flank pain and hematuria.  Musculoskeletal:  Positive for gait problem. Negative for arthralgias, back pain, myalgias and neck pain.  Skin:  Negative for color change and rash.  Neurological:  Positive for weakness (Generalized). Negative for dizziness, seizures, syncope, light-headedness, numbness and headaches.  Hematological:  Does not bruise/bleed easily.  Psychiatric/Behavioral:  Positive for confusion (Acute on chronic).   All other systems reviewed and are negative.  Physical Exam Updated Vital Signs BP (!) 156/109    Pulse 72    Temp 98.2 F (36.8 C) (Oral)    Resp 20    Ht 5\' 9"  (1.753 m)    Wt 72.6 kg    SpO2 100%    BMI 23.63 kg/m  Physical Exam Vitals and nursing  note reviewed.  Constitutional:      General: He is not in acute distress.    Appearance: Normal appearance. He is well-developed and normal weight. He is not toxic-appearing or diaphoretic.  HENT:     Head: Normocephalic and atraumatic.     Right Ear: External ear normal.     Left Ear: External ear normal.     Nose: Nose normal.     Mouth/Throat:     Mouth: Mucous membranes are dry.     Pharynx: Oropharynx is clear.  Eyes:     General: No scleral icterus.    Extraocular Movements: Extraocular movements intact.     Conjunctiva/sclera: Conjunctivae normal.  Cardiovascular:     Rate and Rhythm: Normal rate and regular rhythm.     Heart sounds: No murmur heard. Pulmonary:     Effort: Pulmonary effort is normal. No respiratory distress.  Breath sounds: Wheezing (Faint expiratory wheezing) present.  Abdominal:     Palpations: Abdomen is soft.     Tenderness: There is no abdominal tenderness.     Comments: Well-healed abdominal surgical scars  Musculoskeletal:        General: No swelling. Normal range of motion.     Cervical back: Normal range of motion and neck supple. No rigidity.     Right lower leg: Edema present.     Left lower leg: Edema present.  Skin:    General: Skin is warm and dry.     Coloration: Skin is not jaundiced.  Neurological:     General: No focal deficit present.     Mental Status: He is alert and oriented to person, place, and time.     Cranial Nerves: Cranial nerves 2-12 are intact. No dysarthria or facial asymmetry.     Sensory: Sensation is intact. No sensory deficit.     Motor: Weakness (Diminished strength in lower extremities, slightly more on the right) present. No abnormal muscle tone or pronator drift.  Psychiatric:        Mood and Affect: Mood normal.    ED Results / Procedures / Treatments   Labs (all labs ordered are listed, but only abnormal results are displayed) Labs Reviewed  COMPREHENSIVE METABOLIC PANEL - Abnormal; Notable for the  following components:      Result Value   Creatinine, Ser 1.64 (*)    Alkaline Phosphatase 180 (*)    GFR, Estimated 42 (*)    All other components within normal limits  CBC WITH DIFFERENTIAL/PLATELET - Abnormal; Notable for the following components:   Hemoglobin 12.1 (*)    HCT 37.6 (*)    RDW 15.7 (*)    All other components within normal limits  EXPECTORATED SPUTUM ASSESSMENT W GRAM STAIN, RFLX TO RESP C  MAGNESIUM  BRAIN NATRIURETIC PEPTIDE  URINALYSIS, ROUTINE W REFLEX MICROSCOPIC  CK  BASIC METABOLIC PANEL  CBC  SODIUM, URINE, RANDOM  CREATININE, URINE, RANDOM  PROCALCITONIN  PROCALCITONIN  MYCOPLASMA PNEUMONIAE ANTIBODY, IGM  LEGIONELLA PNEUMOPHILA SEROGP 1 UR AG    EKG EKG Interpretation  Date/Time:  Tuesday May 04 2021 11:47:22 EST Ventricular Rate:  90 PR Interval:  171 QRS Duration: 159 QT Interval:  422 QTC Calculation: 517 R Axis:   257 Text Interpretation: Sinus rhythm Nonspecific IVCD with LAD Abnormal lateral Q waves Confirmed by Godfrey Pick [phone]) on 05/04/2021 12:05:16 PM  Radiology DG Chest 1 View  Result Date: 05/04/2021 CLINICAL DATA:  [place] getting out of bed.  Shortness of breath. EXAM: CHEST  1 VIEW COMPARISON:  04/19/2017.  04/16/2017. FINDINGS: Mild cardiomegaly. Aortic atherosclerosis and tortuosity. Left lung is clear. Some emphysematous changes at the left apex. More extensive emphysema within the right lung. Increased markings in the right lung could reflect a right pneumonia. Consider two-view chest radiography when possible. IMPRESSION: Underlying emphysema, worse on the right than the left. Increased markings in the right lung neck could indicate early pneumonia. Consider two-view chest radiography when able. Electronically Signed   By: Nelson Chimes M.D.   On: 05/04/2021 13:01   CT Head Wo Contrast  Result Date: 05/04/2021 CLINICAL DATA:  Mental status change, unknown cause. Additional history provided: Weakness in legs, shortness of breath.  EXAM: CT HEAD WITHOUT CONTRAST TECHNIQUE: Contiguous axial images were obtained from the base of the skull through the vertex without intravenous contrast. COMPARISON:  MRI brain and MRA head 05/24/2017. head CT 03/20/2018. FINDINGS: Brain:  Mild generalized cerebral atrophy. Small lacunar infarct within the right caudate head, new as compared to the head CT of 03/20/2018 but otherwise age indeterminate (series 3, image 32). 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 Background moderately advanced patchy and ill-defined hypoattenuation within the cerebral white matter, nonspecific but compatible with chronic small vessel ischemic disease. There is no acute intracranial hemorrhage. No demarcated cortical infarct. No extra-axial fluid collection. No evidence of an intracranial mass. No midline shift. Vascular: No hyperdense vessel.  Atherosclerotic calcifications. Skull: Normal. Negative for fracture or focal lesion. Sinuses/Orbits: Visualized orbits show no acute finding. Complete opacification of the left frontal sinus. Frothy secretions within a posterior left ethmoid air cell. IMPRESSION: No acute intracranial hemorrhage or acute demarcated cortical infarction. Small lacunar infarct within the right basal ganglia, new from the prior head CT of 03/20/2018 but otherwise age-indeterminate. A brain MRI may be obtained for further evaluation, as clinically warranted. Moderately advanced chronic small vessel ischemic disease, as described. Mild generalized cerebral atrophy. Paranasal sinus disease, as outlined. Electronically Signed   By: Kellie Simmering D.O.   On: 05/04/2021 12:52   MR BRAIN WO CONTRAST  Result Date: 05/04/2021 CLINICAL DATA:  Initial evaluation for neuro deficit, stroke suspected. EXAM: MRI HEAD WITHOUT CONTRAST TECHNIQUE: Multiplanar, multiecho pulse sequences of the brain and surrounding structures were obtained without intravenous  contrast. COMPARISON:  CT from earlier the same day. FINDINGS: Brain: Generalized age-related cerebral atrophy. Patchy and confluent T2/FLAIR hyperintensity involving the periventricular and deep white matter both cerebral hemispheres as well as the pons, most consistent with chronic small vessel ischemic disease, moderately advanced in nature. Few scattered superimposed remote lacunar infarcts present about the hemispheric cerebral white matter, basal ganglia, and thalami. Few probable tiny remote cerebellar infarcts noted as well. No abnormal foci of restricted diffusion to suggest acute or subacute ischemia. Gray-white matter differentiation maintained. No encephalomalacia to suggest chronic cortical infarction. No evidence for acute or chronic intracranial hemorrhage. No mass lesion, midline shift or mass effect. Mild ventricular prominence related to global parenchymal volume loss of hydrocephalus. No extra-axial fluid collection. Pituitary gland suprasellar region within normal limits. Midline structures intact. Vascular: Major intracranial vascular flow voids are maintained. Major intracranial arterial vasculature appears somewhat dolichoectatic in nature. Skull and upper cervical spine: Craniocervical junction within normal limits. Bone marrow signal intensity diffusely heterogeneous without focal marrow replacing lesion. No scalp soft tissue abnormality. Sinuses/Orbits: Prior ocular lens replacement noted on the left. Globes and orbital soft tissues demonstrate no acute finding. Chronic left frontal and ethmoidal sinusitis noted. Small right mastoid effusion noted, of doubtful significance. Visualized nasopharynx unremarkable. Other: None. IMPRESSION: 1. No acute intracranial abnormality. 2. Age-related cerebral atrophy with moderately advanced chronic microvascular ischemic disease. 3. Chronic left frontal and ethmoidal sinusitis. Electronically Signed   By: Jeannine Boga M.D.   On: 05/04/2021 20:13    MR THORACIC SPINE W WO CONTRAST  Result Date: 05/04/2021 CLINICAL DATA:  Initial evaluation for mid back pain, lower extremity weakness. EXAM: MRI THORACIC WITHOUT AND WITH CONTRAST TECHNIQUE: Multiplanar and multiecho pulse sequences of the thoracic spine were obtained without and with intravenous contrast. CONTRAST:  43mL GADAVIST GADOBUTROL 1 MMOL/ML IV SOLN COMPARISON:  Previous MRI from 05/12/2018. FINDINGS: Alignment: Physiologic with preservation of the normal thoracic kyphosis. No listhesis. Vertebrae: Chronic compression fracture involving the T12 vertebral body with up to approximate 50% height loss and trace 2 mm bony retropulsion. Otherwise, vertebral body height maintained with no other  acute or subacute fracture. Bone marrow signal intensity mildly heterogeneous but overall within normal limits. No worrisome osseous lesions. No abnormal marrow edema or enhancement. Cord:  Normal signal morphology.  No abnormal enhancement. Paraspinal and other soft tissues: Paraspinous soft tissues demonstrate no acute finding. Small layering bilateral pleural effusions with associated atelectasis noted. Disc levels: T9-10: Negative interspace. Bilateral facet hypertrophy. No spinal stenosis. Mild left greater than right foraminal stenosis. T10-11: Degenerative disc bulge with bilateral facet hypertrophy. No significant spinal stenosis. Moderate bilateral foraminal narrowing. Otherwise, no other significant disc pathology seen within the thoracic spine for age. No spinal stenosis. Foramina otherwise remain patent. IMPRESSION: 1. No acute abnormality within the thoracic spine. No findings to explain patient's symptoms. 2. Chronic compression fracture involving the T12 vertebral body with up to 50% height loss and trace 2 mm bony retropulsion. 3. Mild to moderate bilateral foraminal stenosis at T9-10 and T10-11, largely due to facet hypertrophy. No significant spinal stenosis within the thoracic spine. 4. Small  layering bilateral pleural effusions with associated atelectasis. Electronically Signed   By: Jeannine Boga M.D.   On: 05/04/2021 20:23   US RENAL  Result Date: 05/04/2021 CLINICAL DATA:  Acute kidney injury. EXAM: RENAL / URINARY TRACT ULTRASOUND COMPLETE COMPARISON:  CT abdomen and pelvis 05/12/2018. FINDINGS: Right Kidney: Renal measurements: 10.3 x 3.8 x 5.0 cm = volume: 102 mL. Echogenicity within normal limits. No mass or hydronephrosis visualized. Left Kidney: Renal measurements: 11.2 x 5.7 x 4.3 cm = volume: 145 mL. Echogenicity within normal limits. No mass or hydronephrosis visualized. Bladder: Appears normal for degree of bladder distention. Other: None. IMPRESSION: Within normal limits. Electronically Signed   By: Ronney Asters M.D.   On: 05/04/2021 20:05   DG Hip Unilat W or Wo Pelvis 2-3 Views Right  Result Date: 05/04/2021 CLINICAL DATA:  Shortness of breath.  [place] getting out of bed. EXAM: DG HIP (WITH OR WITHOUT PELVIS) 2-3V RIGHT COMPARISON:  None. 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 No evidence of acute fracture. Chronic joint space narrowing at both hip joints. Previous lumbosacral decompression and fusion and previous ORIF in the region of the left iliac bone. IVC filter is noted. IMPRESSION: No acute or traumatic finding. Chronic changes of Paget's disease. Old operative changes as above. Electronically Signed   By: Nelson Chimes M.D.   On: 05/04/2021 12:59   DG FEMUR MIN 2 VIEWS LEFT  Result Date: 05/04/2021 CLINICAL DATA:  Weakness. EXAM: LEFT FEMUR 2 VIEWS COMPARISON:  March 20, 2018. FINDINGS: There is no evidence of fracture or other focal bone lesions. Soft tissues are unremarkable. IMPRESSION: Negative. Electronically Signed   By: Marijo Conception M.D.   On: 05/04/2021 15:08    Procedures Procedures    Medications Ordered in ED Medications  cefTRIAXone (ROCEPHIN) 1 g in sodium chloride 0.9 % 100 mL IVPB (has no administration in time  range)  doxycycline (VIBRA-TABS) tablet 100 mg (has no administration in time range)  heparin injection 5,000 Units (has no administration in time range)  acetaminophen (TYLENOL) tablet 650 mg (has no administration in time range)    Or  acetaminophen (TYLENOL) suppository 650 mg (has no administration in time range)  oxyCODONE (Oxy IR/ROXICODONE) immediate release tablet 5 mg (has no administration in time range)  bisacodyl (DULCOLAX) EC tablet 5 mg (has no administration in time range)  ondansetron (ZOFRAN) tablet 4 mg (has no administration in time range)    Or  ondansetron (ZOFRAN) injection 4  mg (has no administration in time range)  amLODipine (NORVASC) tablet 10 mg (has no administration in time range)  doxazosin (CARDURA) tablet 8 mg (has no administration in time range)  carvedilol (COREG) tablet 3.125 mg (has no administration in time range)  atorvastatin (LIPITOR) tablet 40 mg (has no administration in time range)  FLUoxetine (PROZAC) capsule 20 mg (has no administration in time range)  mirtazapine (REMERON) tablet 30 mg (has no administration in time range)  polyethylene glycol (MIRALAX / GLYCOLAX) packet 17 g (has no administration in time range)  senna (SENOKOT) tablet 8.6 mg (has no administration in time range)  finasteride (PROSCAR) tablet 5 mg (has no administration in time range)  mirabegron ER (MYRBETRIQ) tablet 25 mg (has no administration in time range)  clopidogrel (PLAVIX) tablet 75 mg (has no administration in time range)  folic acid (FOLVITE) tablet 1 mg (has no administration in time range)  melatonin tablet 3 mg (has no administration in time range)  albuterol (PROVENTIL) (2.5 MG/3ML) 0.083% nebulizer solution 2.5 mg (has no administration in time range)  fluticasone (FLONASE) 50 MCG/ACT nasal spray 2 spray (has no administration in time range)  doxycycline (VIBRA-TABS) tablet 100 mg (has no administration in time range)  0.9 %  sodium chloride infusion (has no  administration in time range)  ipratropium-albuterol (DUONEB) 0.5-2.5 (3) MG/3ML nebulizer solution 3 mL (3 mLs Nebulization Given 05/04/21 1441)  acetaminophen (TYLENOL) tablet 650 mg (650 mg Oral Given 05/04/21 1441)  gadobutrol (GADAVIST) 1 MMOL/ML injection 7 mL (7 mLs Intravenous Contrast Given 05/04/21 1953)    ED Course/ Medical Decision Making/ A&P                           Medical Decision Making  81 year old male with history of schizophrenia, COPD, CAD, HLD, HTN, TIA, presenting for shortness of breath and generalized weakness.  There was also concern by family that he has acute on chronic confusion.  He was found to hypoxic on scene by EMS.  He was started on supplemental oxygen.  On arrival, patient is on 4 L of oxygen with normal SPO2.  He is alert and oriented.  His responses are slowed.  He denies any discomfort but does state that he has continued shortness of breath.  He states that he does not do breathing treatments at home, including today.  On exam, he has faint expiratory wheezing on lung auscultation.  DuoNeb breathing treatment was ordered.  Patient was kept on supplemental oxygen.  Diagnostic work-up was initiated.  I spoke with his sister, Ms. Gladis Riffle [phone]).  Ms. Zigmund Daniel does live with her brother.  She reports that, at baseline, he ambulates without any difficulty.  He does have a history of schizoaffective disorder which seems to have been in remission.  At times, he will forget some things otherwise he is mentally alert and cognitively intact.  2 days ago, he had a fall while walking down the driveway.  He appeared to slip and landed on his bottom.  He did not appear to injure anything.  Over the past week, he has been using his inhaler more than usual.  He does not use home nebulized breathing treatments.  Today, he did endorse shortness of breath.  Also today, he slid off of his bed and ended up on the floor.  Once he was on the floor, he was not able to stand  up on his own power.  This is not typical  for him.  He has had a good appetite.  He has endorsed bilateral leg pain, greater on the right.  He has not had any other complaints lately.  Patient requested Tylenol for low back pain.  This was ordered.  On reassessment, patient seems less slowed in his responses than he did on arrival.  He was able to tell me about his current back pain as well as the leg pain and weakness that he has been having.  Patient endorses pain in the area of his right hip as well as pain in the area of his left thigh.  When assessing lower extremity strength, he does have slightly diminished strength in BLE, which is more pronounced in the right leg.  Given his history of Paget's disease, patient is at risk for both areas of stenosis as well as osseous tumors.  MRI studies of T and L-spine were ordered.  Additionally, his CT head showed an age-indeterminate small lacunar infarct in the right basal ganglia.  MRI of brain was ordered to further characterize.  More notably, patient's chest x-ray showed evidence of pneumonia.  This is consistent with his acute shortness of breath and new hypoxia.  CAP treatment was ordered.  Lab work showed AKI.  Patient was admitted to hospitalist for further management.        Final Clinical Impression(s) / ED Diagnoses Final diagnoses:  Weakness  AKI (acute kidney injury) [facility])    Rx / [place] Orders ED Discharge Orders     None         Godfrey Pick, MD 05/04/21 2041    Godfrey Pick, MD 05/04/21 2041

## 2021-05-04 NOTE — ED Notes (Signed)
Patient still at MRI

## 2021-05-04 NOTE — ED Notes (Signed)
Patient at MRI 

## 2021-05-05 ENCOUNTER — Inpatient Hospital Stay (HOSPITAL_COMMUNITY): Payer: Medicare Other

## 2021-05-05 DIAGNOSIS — I6389 Other cerebral infarction: Secondary | ICD-10-CM | POA: Diagnosis not present

## 2021-05-05 DIAGNOSIS — R262 Difficulty in walking, not elsewhere classified: Secondary | ICD-10-CM

## 2021-05-05 DIAGNOSIS — J189 Pneumonia, unspecified organism: Secondary | ICD-10-CM

## 2021-05-05 LAB — ECHOCARDIOGRAM COMPLETE
AR max vel: 2.78 cm2
AV Area VTI: 2.13 cm2
AV Area mean vel: 2.65 cm2
AV Mean grad: 3 mmHg
AV Peak grad: 5.4 mmHg
Ao pk vel: 1.16 m/s
Area-P 1/2: 5.27 cm2
Height: 69 in
S' Lateral: 2.58 cm
Single Plane A2C EF: 57.2 %
Weight: 2560 oz

## 2021-05-05 LAB — PROCALCITONIN: Procalcitonin: <0.1 ng/mL

## 2021-05-05 LAB — CBC
HCT: 36 % — ABNORMAL LOW (ref 39.0–52.0)
Hemoglobin: 11.5 g/dL — ABNORMAL LOW (ref 13.0–17.0)
MCH: 27.8 pg (ref 26.0–34.0)
MCHC: 31.9 g/dL (ref 30.0–36.0)
MCV: 87.2 fL (ref 80.0–100.0)
Platelets: 147 10*3/uL — ABNORMAL LOW (ref 150–400)
RBC: 4.13 MIL/uL — ABNORMAL LOW (ref 4.22–5.81)
RDW: 15.6 % — ABNORMAL HIGH (ref 11.5–15.5)
WBC: 7.5 10*3/uL (ref 4.0–10.5)
nRBC: 0 % (ref 0.0–0.2)

## 2021-05-05 LAB — BASIC METABOLIC PANEL
Anion gap: 9 (ref 5–15)
BUN: 14 mg/dL (ref 8–23)
CO2: 23 mmol/L (ref 22–32)
Calcium: 8.6 mg/dL — ABNORMAL LOW (ref 8.9–10.3)
Chloride: 107 mmol/L (ref 98–111)
Creatinine, Ser: 1 mg/dL (ref 0.61–1.24)
GFR, Estimated: >60 mL/min (ref >60)
Glucose, Bld: 118 mg/dL — ABNORMAL HIGH (ref 70–99)
Potassium: 3.2 mmol/L — ABNORMAL LOW (ref 3.5–5.1)
Sodium: 139 mmol/L (ref 135–145)

## 2021-05-05 LAB — RESP PANEL BY RT-PCR (FLU A&B, COVID) ARPGX2
Influenza A by PCR: NEGATIVE
Influenza B by PCR: NEGATIVE
SARS Coronavirus 2 by RT PCR: NEGATIVE

## 2021-05-05 MED ORDER — POTASSIUM CHLORIDE CRYS ER 20 MEQ PO TBCR
40.0000 meq | EXTENDED_RELEASE_TABLET | Freq: Once | ORAL | Status: AC
  Administered 2021-05-05: 40 meq via ORAL
  Filled 2021-05-05: qty 2

## 2021-05-05 MED ORDER — DEXAMETHASONE SODIUM PHOSPHATE 4 MG/ML IJ SOLN
4.0000 mg | Freq: Two times a day (BID) | INTRAMUSCULAR | Status: DC
  Administered 2021-05-05 – 2021-05-10 (×11): 4 mg via INTRAVENOUS
  Filled 2021-05-05 (×12): qty 1

## 2021-05-05 NOTE — Progress Notes (Signed)
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 He is currently on plavix so no neurosurgical intervention warranted at this time. Would recommend pain control and decadron. Outpatient follow up.

## 2021-05-05 NOTE — Evaluation (Signed)
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 male who presented with new onset of right foot weakness ambulation dysfunction and frequent falls, and cough and shortness of breath. Patient has poorly controlled sciatica with chronic back pain shooting down to lateral side of the right leg.  Increasing need for 2 to 3 weeks, patient has been using cane because new onset of right foot weakness and worsening of back and leg pain.  CXR 1/3 with no acute findings.  MRI 1/3 with no acute findings. Pt with medical history significant of Paget's disease on biphosphonate, sciatica with chronic back pain shooting down to right leg, HTN, HLD, renal stones, BPH, COPD. Pt with hx of dysphagia in 2018 during previous admission and remotely following accident around 1998.  Pt with MBSS 04/14/2017 without aspiration, transient penetration of thin liquid, and recommendations for puree and thin liquid at that time.    Assessment / Plan / Recommendation  Clinical Impression  Pt presents with functional swallowing as assessed clinically.  Pt is very pleasant and  reports no difficulty with PO intake at baseline.  Pt is edentulous but states he eats a regular texture diet. Today pt tolerated all consistencies trialed with no clinical s/s of aspiration noted even with serial straw sips of thin liquid.  Pt took large bites of regular solid graham cracker and achieved adequate oral clearance independently.    Recommend continuing regular texture solid with thin liuqids.  Pt has no further ST needs; SLP will sign off at this time.  SLP Visit Diagnosis: Dysphagia, unspecified (R13.10)    Aspiration Risk  No limitations    Diet Recommendation Regular;Thin liquid   Liquid Administration via: Cup;Straw Medication Administration:  (As tolerated) Supervision: Patient able to self feed Compensations: Slow rate;Small sips/bites Postural Changes: Seated upright at 90 degrees    Other  Recommendations Oral Care Recommendations: Oral care BID    Recommendations for follow up therapy are one component of a multi-disciplinary discharge planning process, led by the attending physician.  Recommendations may be updated based on patient status, additional functional criteria and insurance authorization.  Follow up Recommendations No SLP follow up      Assistance Recommended at Discharge None  Functional Status Assessment Patient has not had a recent decline in their functional status  Frequency and Duration  (N/A)          Prognosis Prognosis for Safe Diet Advancement:  (N/A)      Swallow Study   General Date  of Onset: 05/04/21 HPI: Norman Davis is a 81 y.o. male who presented with new onset of right foot weakness ambulation dysfunction and frequent falls, and cough and shortness of breath. Patient has poorly controlled sciatica with chronic back pain shooting down to lateral side of the right leg.  Increasing need for 2 to 3 weeks, patient has been using cane because new onset of right foot weakness and worsening of back and leg pain.  CXR 1/3 with no acute  findings.  MRI 1/3 with no acute findings. Pt with medical history significant of Paget's disease on biphosphonate, sciatica with chronic back pain shooting down to right leg, HTN, HLD, renal stones, BPH, COPD. Pt with hx of dysphagia in 2018 during previous admission and remotely following accident around 1998.  Pt with MBSS 04/14/2017 without aspiration, transient penetration of thin liquid, and recommendations for puree and thin liquid at that time. Type of Study: Bedside Swallow Evaluation Diet Prior to this Study: Regular;Thin liquids Temperature Spikes Noted: No History of Recent Intubation: No Behavior/Cognition: Alert;Cooperative;Pleasant mood Oral Cavity Assessment: Within Functional Limits Oral Care Completed by SLP: No Oral Cavity - Dentition: Edentulous Vision: Functional for self-feeding Self-Feeding Abilities: Able to feed self Patient Positioning: Upright in bed Baseline Vocal Quality: Normal Volitional Cough: Strong Volitional Swallow: Able to elicit    Oral/Motor/Sensory Function Overall Oral Motor/Sensory Function: Within functional limits Facial ROM: Within Functional Limits Facial Symmetry: Within Functional Limits Lingual ROM: Within Functional Limits Lingual Symmetry: Within Functional Limits Lingual Strength: Reduced; tremor noted Velum: Within Functional Limits Mandible: Within Functional Limits   Ice Chips Ice chips: Not tested   Thin Liquid Thin Liquid: Within functional limits Presentation: Straw;Cup    Nectar Thick Nectar Thick Liquid: Not tested   Honey Thick Honey Thick Liquid: Not tested   Puree Puree: Within functional limits Presentation: Spoon   Solid     Solid: Within functional limits Presentation: Self Fed      Kerrie Pleasure, MA, CCC-SLP Acute Rehabilitation Services Office: [phone] 05/05/2021,11:53 AM

## 2021-05-05 NOTE — Consult Note (Signed)
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 progressed from prior, likely combination of degenerative changes in sequelae of prior osteomyelitis discitis. Associated reactive endplate change with marginal endplate osteophytic spurring. Moderate facet hypertrophy with ankylosis of the L3-4 facets as well. Resultant severe spinal stenosis, with the thecal sac measuring 7 mm in AP diameter. Moderate bilateral L3 foraminal narrowing. L4-5: Advanced degenerative intervertebral disc space narrowing with diffuse disc bulge, disc desiccation, and reactive endplate spurring. Moderate bilateral facet hypertrophy. Resultant moderate canal with bilateral subarticular stenosis. Mild bilateral L4 foraminal narrowing. L5-S1: Prior PLIF.  No residual spinal stenosis. Foramina remain patent. IMPRESSION: 1. No acute abnormality within the lumbar spine. 2. 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 No convincing evidence for active infection within the lumbar spine. 3. Multifactorial degenerative changes at L2-3 and L3-4 with resultant severe spinal stenosis, with moderate bilateral L3 foraminal narrowing. 4. Left eccentric disc osteophyte and facet hypertrophy at L1-2 with resultant moderate left lateral recess and foraminal stenosis. Reactive endplate edema and enhancement at this level favored to be degenerative. 5. Prior PLIF at L5-S1 without residual or recurrent stenosis. Electronically Signed  By: Jeannine Boga M.D.   On: 05/04/2021 20:44   US RENAL  Result Date: 05/04/2021 CLINICAL DATA:  Acute kidney injury. EXAM: RENAL / URINARY TRACT ULTRASOUND COMPLETE COMPARISON:  CT abdomen and pelvis 05/12/2018. FINDINGS: Right Kidney: Renal measurements: 10.3 x 3.8 x 5.0 cm = volume: 102 mL. Echogenicity within normal limits. No mass or hydronephrosis visualized. Left Kidney: Renal measurements: 11.2 x 5.7 x 4.3 cm = volume: 145 mL. Echogenicity within normal limits. No mass or hydronephrosis visualized. Bladder: Appears normal for degree of bladder distention. Other: None. IMPRESSION: Within normal limits. Electronically Signed   By: Ronney Asters M.D.   On: 05/04/2021 20:05   DG Hip Unilat W or Wo Pelvis 2-3 Views Right  Result Date: 05/04/2021 CLINICAL DATA:  Shortness of breath.  [place] getting out of bed. EXAM: DG HIP (WITH OR WITHOUT PELVIS) 2-3V RIGHT COMPARISON:  None. 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 No evidence of acute fracture. Chronic joint space narrowing at both hip joints. Previous lumbosacral decompression and fusion and previous ORIF in the region of the left iliac bone. IVC filter is noted. IMPRESSION: No acute or traumatic finding. Chronic  changes of Paget's disease. Old operative changes as above. Electronically Signed   By: Nelson Chimes M.D.   On: 05/04/2021 12:59   DG FEMUR MIN 2 VIEWS LEFT  Result Date: 05/04/2021 CLINICAL DATA:  Weakness. EXAM: LEFT FEMUR 2 VIEWS COMPARISON:  March 20, 2018. FINDINGS: There is no evidence of fracture or other focal bone lesions. Soft tissues are unremarkable. IMPRESSION: Negative. Electronically Signed   By: Marijo Conception M.D.   On: 05/04/2021 15:08    Review of Systems  Musculoskeletal:  Positive for back pain.  Blood pressure (!) 147/106, pulse 94, temperature 98.8 F (37.1 C), temperature source Oral, resp. rate 18, height 5\' 9"  (1.753 m), weight 72.6 kg, SpO2 95 %. 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  Patient has normal strength exam on my assessment he has no evidence of cauda equina syndrome.  Urinary retention seems to be related to history of BPH.  He is on Plavix for some unknown reason the patient is a poor historian he could not tell me presumptively may be related to previous CVA or stroke.  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  Patient is a poor surgical candidate and I do not feel like he needs emergent decompression regardless.  He seems to have an intact lower extremity strength exam agree with recommendations for admission physical therapy Decadron muscle relaxation pain management bite consider epidural steroid injection.  Would need to hold Plavix if patient is medically stable to do so if not, then continue Plavix and we will take epidural steroid injections off the table.  Elaina Hoops 05/05/2021, 11:16 AM

## 2021-05-05 NOTE — Progress Notes (Signed)
°  Echocardiogram 2D Echocardiogram has been performed.  Beryle Beams 05/05/2021, 10:51 AM

## 2021-05-05 NOTE — Evaluation (Signed)
Occupational Therapy Evaluation Patient Details Name: Norman Davis MRN: [id number] DOB: 30-Jan-1941 Today's Date: 05/05/2021   History of Present Illness Pt is an 81 y/o male admitted secondary to weakness and falls. MRI negative. PMH includes COPD, schizophrenia, Paget's disease and HTN.   Clinical Impression   [name] reports being mod I PTA with 1 recent fall. He lives in a 1 level home, 2 STE with his sister who is able to assist as needed. Upon evaluation pt required mod A for bed mobility and for 1x sit<>stand. Pt can take scooting steps at the bed side, but is unable to march in place with foot clearance this date. He has a posterior lean in both sitting and standing. He required up to max A for ADLs and is limited by pain, impaired balance, cognition and poor activity tolerance. He will benefit from OT acutely. Recommend d/c to SNF, pt hesitant on this option however verbalized understanding of safety and caregiver burden.     Recommendations for follow up therapy are one component of a multi-disciplinary discharge planning process, led by the attending physician.  Recommendations may be updated based on patient status, additional functional criteria and insurance authorization.   Follow Up Recommendations  Skilled nursing-short term rehab (<3 hours/day)    Assistance Recommended at Discharge Frequent or constant Supervision/Assistance  Patient can return home with the following A lot of help with walking and/or transfers;A lot of help with bathing/dressing/bathroom;Assistance with cooking/housework;Direct supervision/assist for medications management;Assist for transportation;Help with stairs or ramp for entrance    Functional Status Assessment  Patient has had a recent decline in their functional status and demonstrates the ability to make significant improvements in function in a reasonable and predictable amount of time.  Equipment Recommendations  BSC/3in1    Recommendations for  Other Services       Precautions / Restrictions Precautions Precautions: Fall Restrictions Weight Bearing Restrictions: No      Mobility Bed Mobility Overal bed mobility: Needs Assistance Bed Mobility: Supine to Sit;Sit to Supine     Supine to sit: Mod assist Sit to supine: Mod assist   General bed mobility comments: Required assist for trunk and LEs.    Transfers Overall transfer level: Needs assistance Equipment used: Rolling walker (2 wheels) Transfers: Sit to/from Stand Sit to Stand: Mod assist           General transfer comment: mod A for sit<>stand. Unable to march in place this date. can scoot his BLE laterally for side steps      Balance Overall balance assessment: Needs assistance Sitting-balance support: Feet supported;Bilateral upper extremity supported Sitting balance-Leahy Scale: Poor Sitting balance - Comments: posterior LOB, min A throughout for sitting balance Postural control: Posterior lean Standing balance support: Bilateral upper extremity supported Standing balance-Leahy Scale: Poor                             ADL either performed or assessed with clinical judgement   ADL Overall ADL's : Needs assistance/impaired Eating/Feeding: Independent;Bed level Eating/Feeding Details (indicate cue type and reason): pt eating at bed level with food spilled all over the himself and the bed Grooming: Minimal assistance;Sitting Grooming Details (indicate cue type and reason): min A for sitting balance Upper Body Bathing: Minimal assistance;Sitting   Lower Body Bathing: Maximal assistance;Sit to/from stand   Upper Body Dressing : Minimal assistance;Sitting   Lower Body Dressing: Maximal assistance;Sit to/from stand   Toilet Transfer: Maximal assistance;+2 for physical  assistance;Ambulation Toilet Transfer Details (indicate cue type and reason): unable to march in place with max A +1 this date Toileting- Clothing Manipulation and Hygiene:  Maximal assistance;Sit to/from stand       Functional mobility during ADLs: Maximal assistance;+2 for physical assistance;+2 for safety/equipment General ADL Comments: pt required assist for sitting balance with posterior LOB several times throughout session. soiled with food upon arrival. washed and donned new gown     Vision Baseline Vision/History: 0 No visual deficits Ability to See in Adequate Light: 0 Adequate Patient Visual Report: No change from baseline Vision Assessment?: No apparent visual deficits     Perception     Praxis      Pertinent Vitals/Pain Pain Assessment: 0-10 Pain Score: 7  Faces Pain Scale: Hurts a little bit Pain Location: low back Pain Descriptors / Indicators: Discomfort Pain Intervention(s): Limited activity within patient's tolerance;Monitored during session     Hand Dominance Right   Extremity/Trunk Assessment Upper Extremity Assessment Upper Extremity Assessment: Generalized weakness (globally 4/5 MMT, ROM is WFL, sensation is WFL, coordination is slow and deliberate)   Lower Extremity Assessment Lower Extremity Assessment: Defer to PT evaluation   Cervical / Trunk Assessment Cervical / Trunk Assessment: Kyphotic   Communication Communication Communication: No difficulties   Cognition Arousal/Alertness: Awake/alert Behavior During Therapy: WFL for tasks assessed/performed Overall Cognitive Status: No family/caregiver present to determine baseline cognitive functioning                                       General Comments  no family present    Exercises     Shoulder Instructions      Home Living Family/patient expects to be discharged to:: Private residence Living Arrangements: Other relatives (sister) Available Help at Discharge: Family;Available 24 hours/day Type of Home: House Home Access: Stairs to enter [employer] of Steps: 2 Entrance Stairs-Rails: None Home Layout: One level      Bathroom Shower/Tub: Chief Strategy Officer: Standard     Home Equipment: Cane - single point;Rollator (4 wheels);Shower seat          Prior Functioning/Environment Prior Level of Function : Independent/Modified Independent             Mobility Comments: Uses cane for ambulation ADLs Comments: Reports independence, does not drive        OT Problem List: Decreased strength;Decreased range of motion;Decreased activity tolerance;Impaired balance (sitting and/or standing);Decreased safety awareness;Decreased knowledge of use of DME or AE;Decreased knowledge of precautions;Pain      OT Treatment/Interventions: Self-care/ADL training;Therapeutic exercise;DME and/or AE instruction;Therapeutic activities;Patient/family education;Balance training    OT Goals(Current goals can be found in the care plan section) Acute Rehab OT Goals Patient Stated Goal: home soon OT Goal Formulation: With patient Time For Goal Achievement: 05/19/21 Potential to Achieve Goals: Good ADL Goals Pt Will Perform Grooming: with modified independence;standing Pt Will Perform Lower Body Dressing: with modified independence;sit to/from stand Pt Will Transfer to Toilet: with modified independence;ambulating Additional ADL Goal #1: pt will indep verbalize at least 3 fall prevention strategies to apply in the home setting  OT Frequency: Min 2X/week    Co-evaluation              AM-PAC OT "6 Clicks" Daily Activity     Outcome Measure Help from another person eating meals?: None Help from another person taking care of personal grooming?: A Little Help  from another person toileting, which includes using toliet, bedpan, or urinal?: A Lot Help from another person bathing (including washing, rinsing, drying)?: A Lot Help from another person to put on and taking off regular upper body clothing?: A Little Help from another person to put on and taking off regular lower body clothing?: A Lot 6  Click Score: 16   End of Session Equipment Utilized During Treatment: Gait belt;Rolling walker (2 wheels) Nurse Communication: Mobility status  Activity Tolerance: Patient tolerated treatment well Patient left: in bed;with call bell/phone within reach  OT Visit Diagnosis: Unsteadiness on feet (R26.81);Other abnormalities of gait and mobility (R26.89);Muscle weakness (generalized) (M62.81);History of falling (Z91.81);Pain                Time: [id number] OT Time Calculation (min): 17 min Charges:  OT General Charges $OT Visit: 1 Visit OT Evaluation $OT Eval Moderate Complexity: 1 Mod   Lera Gaines A Sherhonda Gaspar 05/05/2021, 4:47 PM

## 2021-05-05 NOTE — ED Notes (Signed)
Patient changed and boosted up in bed.

## 2021-05-05 NOTE — Progress Notes (Signed)
PT Evaluation Note   05/05/21 1144  PT Visit Information  Last PT Received On 05/05/21  Assistance Needed +2 (for mobility progression)  History of Present Illness Pt is an 81 y/o male admitted secondary to weakness and falls. MRI negative. PMH includes COPD, schizophrenia, Paget's disease and HTN.  Precautions  Precautions Fall  Restrictions  Weight Bearing Restrictions No  Home Living  Family/patient expects to be discharged to: Private residence  Living Arrangements Other relatives (sister)  Available Help at Discharge Family;Available 24 hours/day  Type of Home House  Home Access Stairs to enter  Entrance Stairs-Number of Steps 1  Entrance Stairs-Rails None  Home Layout One level  Bathroom Shower/Tub Tub/shower unit  Dealer - single point;Rollator (4 wheels);Shower seat  Prior Function  Prior Level of Function  Independent/Modified Independent  Mobility Comments Uses cane for ambulation  ADLs Comments Reports independence  Communication  Communication No difficulties  Pain Assessment  Pain Assessment 0-10  Pain Score 7  Pain Location low back  Pain Descriptors / Indicators Grimacing;Guarding  Pain Intervention(s) Limited activity within patient's tolerance;Monitored during session;Repositioned  Cognition  Arousal/Alertness Awake/alert  Behavior During Therapy WFL for tasks assessed/performed  Overall Cognitive Status No family/caregiver present to determine baseline cognitive functioning  Upper Extremity Assessment  Upper Extremity Assessment Defer to OT evaluation  Lower Extremity Assessment  Lower Extremity Assessment Generalized weakness  Cervical / Trunk Assessment  Cervical / Trunk Assessment Kyphotic  Bed Mobility  Overal bed mobility Needs Assistance  Bed Mobility Supine to Sit;Sit to Supine  Supine to sit Mod assist  Sit to supine Mod assist  General bed mobility comments Required assist for trunk and LEs.   Transfers  Overall transfer level Needs assistance  Equipment used Rolling walker (2 wheels)  Transfers Sit to/from Stand  Sit to Stand Mod assist  General transfer comment Mod A for lift assist and steadying. Pt with posterior lean and relying on stretcher for support. Min to mod A for steadying to take side steps at EOB. Pt fatiguing easily, so further mobility deferred.  Balance  Overall balance assessment Needs assistance  Sitting-balance support Feet supported;Bilateral upper extremity supported  Sitting balance-Leahy Scale Poor  Sitting balance - Comments Reliant on BUE support  Postural control Posterior lean  Standing balance support Bilateral upper extremity supported  Standing balance-Leahy Scale Poor  Standing balance comment Reliant on BUE and external support  General Comments  General comments (skin integrity, edema, etc.) No family present  PT - End of Session  Equipment Utilized During Treatment Gait belt  Activity Tolerance Patient limited by fatigue  Patient left in bed;with call bell/phone within reach;Other (comment) (on stretcher in ED with vascular staff present)  Nurse Communication Mobility status  PT Assessment  PT Recommendation/Assessment Patient needs continued PT services  PT Visit Diagnosis Unsteadiness on feet (R26.81);Muscle weakness (generalized) (M62.81)  PT Problem List Decreased strength;Decreased balance;Decreased activity tolerance;Decreased mobility;Decreased knowledge of use of DME;Decreased cognition;Decreased knowledge of precautions;Decreased safety awareness  PT Plan  PT Frequency (ACUTE ONLY) Min 2X/week  PT Treatment/Interventions (ACUTE ONLY) DME instruction;Gait training;Functional mobility training;Therapeutic exercise;Therapeutic activities;Balance training;Patient/family education  AM-PAC PT "6 Clicks" Mobility Outcome Measure (Version 2)  Help needed turning from your back to your side while in a flat bed without using bedrails? 2   Help needed moving from lying on your back to sitting on the side of a flat bed without using bedrails? 2  Help needed moving to and from  a bed to a chair (including a wheelchair)? 2  Help needed standing up from a chair using your arms (e.g., wheelchair or bedside chair)? 2  Help needed to walk in hospital room? 2  Help needed climbing 3-5 steps with a railing?  2  6 Click Score 12  Consider Recommendation of Discharge To: CIR/SNF/LTACH  Progressive Mobility  What is the highest level of mobility based on the progressive mobility assessment? Level 3 (Stands with assist) - Balance while standing  and cannot march in place  Mobility Out of bed for toileting;Out of bed to chair with meals  PT Recommendation  Follow Up Recommendations Skilled nursing-short term rehab (<3 hours/day)  Assistance recommended at discharge Frequent or constant Supervision/Assistance  Patient can return home with the following A lot of help with walking and/or transfers;A lot of help with bathing/dressing/bathroom;Help with stairs or ramp for entrance  Functional Status Assessment Patient has had a recent decline in their functional status and demonstrates the ability to make significant improvements in function in a reasonable and predictable amount of time.  PT equipment Wheelchair cushion (measurements PT);Wheelchair (measurements PT)  Individuals Consulted  Consulted and Agree with Results and Recommendations Patient  Acute Rehab PT Goals  Patient Stated Goal to feel better  PT Goal Formulation With patient  Time For Goal Achievement 05/19/21  Potential to Achieve Goals Fair  PT Time Calculation  PT Start Time (ACUTE ONLY) 1142  PT Stop Time (ACUTE ONLY) 1200  PT Time Calculation (min) (ACUTE ONLY) 18 min  PT General Charges  $$ ACUTE PT VISIT 1 Visit  PT Evaluation  $PT Eval Moderate Complexity 1 Mod   Pt admitted secondary to problem above with deficits above. Pt requiring mod A for bed mobility and  min to mod A to stand and take side steps at EOB. Pt with posterior lean and leaning heavily on stretcher. Recommending SNF level therapies at d/c. Will continue to follow acutely.   Farley Ly, PT, DPT  Acute Rehabilitation Services  Pager: [phone] Office: [phone]

## 2021-05-05 NOTE — Discharge Planning (Signed)
Pt active at [place]: Norman Davis   Desk phone: [phone]

## 2021-05-05 NOTE — ED Notes (Signed)
SLP at bedside assessing pt at this time.  

## 2021-05-05 NOTE — Progress Notes (Signed)
[facility] PROGRESS NOTE   Norman Davis [id number] DOB: [birth year] DOA: 05/04/2021  PCP: [facility]  Brief History/Interval Summary: 81 y.o. 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 Patient has poorly controlled sciatica with chronic back pain shooting down to lateral side of the right leg.  Patient apparently had several falls as a result of this weakness.  Has had previous back surgery though not yet at home.   CT head raise concern for subacute infarct.  Patient was hospitalized for further management.  Reason for Visit: Right leg weakness with inability to ambulate  Consultants: None at this time  Procedures: None    Subjective/Interval History: Patient mentions that he has been having pain in the back radiating down his leg.  He is able to move his leg this morning.  Has not tried walking yet.  Denies any chest pain shortness of breath.     Assessment/Plan:  Right lower extremity weakness with ambulatory dysfunction Patient has had inability to ambulate as a result of his back pain which has been radiating down his right leg.  He has had multiple falls.  Waiting on PT and OT evaluation. MRI of the thoracic and lumbar spine was ordered.  Shows spinal stenosis which is severe at L2-3 and L3-4.  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 No acute findings was noted.  We will discuss this with neurosurgery. CT head also raise concern for a subacute infarct although this was on the right hemisphere.  MRI brain does not show any acute stroke.  Do not anticipate any further work-up at this time.  Acute kidney injury/hypokalemia Presented with creatinine of 1.64.  Noted to be 1.0 this  morning.  Cut back on his IV fluids.  Replace potassium.  Magnesium was 1.7 yesterday. Renal ultrasound did not show any acute findings.  Right-sided pneumonia, community-acquired Respiratory status seems to be stable.  Currently on doxycycline.  Essential hypertension Continue home medications.  Losartan on hold due to elevated creatinine.  Blood pressure reasonably well controlled at this time.  History of Paget's disease On bisphosphonate  History of COPD Stable.  Normocytic anemia No evidence of overt blood loss.  Continue to monitor.    DVT Prophylaxis: Subcutaneous heparin Code Status: Full code Family Communication: Discussed with patient.  No family at bedside Disposition Plan: To be determined  Status is: Inpatient  Remains inpatient appropriate because: Inability to ambulate, unsafe discharge     Medications: Scheduled:  amLODipine  10 mg Oral Daily   atorvastatin  40 mg Oral q1800   carvedilol  3.125 mg Oral BID WC   clopidogrel  75 mg Oral Q breakfast   doxazosin  8 mg Oral QHS   doxycycline  100 mg Oral Q12H   finasteride  5 mg Oral Daily   FLUoxetine  20 mg Oral Daily   folic acid  1 mg Oral Daily   heparin  5,000 Units Subcutaneous Q12H   melatonin  3 mg Oral QHS   mirabegron ER  25 mg Oral Daily   mirtazapine  30 mg Oral QHS   polyethylene glycol  17 g Oral Daily   senna  1 tablet Oral Daily   Continuous:  sodium chloride 100 mL/hr at 05/05/21 0751  [id number] **OR** acetaminophen, albuterol, bisacodyl, fluticasone, ondansetron **OR** ondansetron (ZOFRAN) IV, oxyCODONE  Antibiotics: Anti-infectives (From admission, onward)    Start     Dose/Rate Route Frequency Ordered Stop   05/05/21 2200  doxycycline (VIBRA-TABS) tablet 100 mg        100 mg Oral Every 12 hours 05/04/21 1936     05/05/21 1500  cefTRIAXone (ROCEPHIN) 1 g in sodium chloride 0.9 % 100 mL IVPB  Status:  Discontinued        1 g 200 mL/hr over 30 Minutes Intravenous  Every 24 hours 05/04/21 1829 05/04/21 1936   05/04/21 1530  cefTRIAXone (ROCEPHIN) 1 g in sodium chloride 0.9 % 100 mL IVPB        1 g 200 mL/hr over 30 Minutes Intravenous  Once 05/04/21 1529 05/04/21 2326   05/04/21 1530  doxycycline (VIBRA-TABS) tablet 100 mg        100 mg Oral  Once 05/04/21 1529 05/04/21 2155       Objective:  Vital Signs  Vitals:   05/05/21 0500 05/05/21 0530 05/05/21 0600 05/05/21 0742  BP: (!) 157/111 (!) 160/119 (!) 163/105 (!) 162/118  Pulse: 86 88 89 92  Resp: 15 14 18 15   Temp:    98.8 F (37.1 C)  TempSrc:    Oral  SpO2: 94% 92% 94% 93%  Weight:      Height:        Intake/Output Summary (Last 24 hours) at 05/05/2021 0932 Last data filed at 05/04/2021 2326 Gross per 24 hour  Intake 100 ml  Output --  Net 100 ml   Filed Weights   05/04/21 1148  Weight: 72.6 kg    General appearance: Awake alert.  In no distress Resp: Normal effort at rest.  Diminished air entry at the bases.  No wheezing or rhonchi. Cardio: S1-S2 is normal regular.  No S3-S4.  No rubs murmurs or bruit GI: Abdomen is soft.  Nontender nondistended.  Bowel sounds are present normal.  No masses organomegaly Extremities: No edema.  Able to lift both legs of the bed. Neurologic:  No focal neurological deficits.    Lab Results:  Data Reviewed: I have personally reviewed following labs and imaging studies  CBC: Recent Labs  Lab 05/04/21 1318 05/05/21 0220  WBC 8.5 7.5  NEUTROABS 6.9  --   HGB 12.1* 11.5*  HCT 37.6* 36.0*  MCV 87.9 87.2  PLT 170 147*    Basic Metabolic Panel: Recent Labs  Lab 05/04/21 1318 05/05/21 0220  NA 143 139  K 3.6 3.2*  CL 108 107  CO2 26 23  GLUCOSE 97 118*  BUN 18 14  CREATININE 1.64* 1.00  CALCIUM 8.9 8.6*  MG 1.7  --     GFR: Estimated Creatinine Clearance: 57 mL/min (by C-G formula based on SCr of 1 mg/dL).  Liver Function Tests: Recent Labs  Lab 05/04/21 1318  AST 15  ALT 10  ALKPHOS 180*  BILITOT 1.0  PROT 7.2   ALBUMIN 3.7     Cardiac Enzymes: Recent Labs  Lab 05/04/21 1318  CKTOTAL 104     Recent Results (from the past 240 hour(s))  Resp Panel by RT-PCR (Flu A&B, Covid) Nasopharyngeal Swab     Status: None   Collection Time: 05/05/21  7:45 AM   Specimen: Nasopharyngeal Swab; Nasopharyngeal(NP) swabs in vial transport medium  Result Value Ref Range Status   SARS Coronavirus 2 by RT PCR NEGATIVE NEGATIVE Final    Comment: (NOTE) SARS-CoV-2  target nucleic acids are NOT DETECTED.  The SARS-CoV-2 RNA is generally detectable in upper respiratory specimens during the acute phase of infection. The lowest concentration of SARS-CoV-2 viral copies this assay can detect is 138 copies/mL. A negative result does not preclude SARS-Cov-2 infection and should not be used as the sole basis for treatment or other patient management decisions. A negative result may occur with  improper specimen collection/handling, submission of specimen other than nasopharyngeal swab, presence of viral mutation(s) within the areas targeted by this assay, and inadequate number of viral copies(<138 copies/mL). A negative result must be combined with clinical observations, patient history, and epidemiological information. The expected result is Negative.  Fact Sheet for Patients:  EntrepreneurPulse.com.au  Fact Sheet for Healthcare Providers:  IncredibleEmployment.be  This test is no t yet approved or cleared by the [place] FDA and  has been authorized for detection and/or diagnosis of SARS-CoV-2 by FDA under an Emergency Use Authorization (EUA). This EUA will remain  in effect (meaning this test can be used) for the duration of the COVID-19 declaration under Section 564(b)(1) of the Act, 21 U.S.C.section 360bbb-3(b)(1), unless the authorization is terminated  or revoked sooner.       Influenza A by PCR NEGATIVE NEGATIVE Final   Influenza B by PCR NEGATIVE NEGATIVE  Final    Comment: (NOTE) The Xpert Xpress SARS-CoV-2/FLU/RSV plus assay is intended as an aid in the diagnosis of influenza from Nasopharyngeal swab specimens and should not be used as a sole basis for treatment. Nasal washings and aspirates are unacceptable for Xpert Xpress SARS-CoV-2/FLU/RSV testing.  Fact Sheet for Patients: EntrepreneurPulse.com.au  Fact Sheet for Healthcare Providers: IncredibleEmployment.be  This test is not yet approved or cleared by the [place] FDA and has been authorized for detection and/or diagnosis of SARS-CoV-2 by FDA under an Emergency Use Authorization (EUA). This EUA will remain in effect (meaning this test can be used) for the duration of the COVID-19 declaration under Section 564(b)(1) of the Act, 21 U.S.C. section 360bbb-3(b)(1), unless the authorization is terminated or revoked.  Performed at [facility], [place] [address] [place], [place] [address]       Radiology Studies: DG Chest 1 View  Result Date: 05/04/2021 CLINICAL DATA:  [place] getting out of bed.  Shortness of breath. EXAM: CHEST  1 VIEW COMPARISON:  04/19/2017.  04/16/2017. FINDINGS: Mild cardiomegaly. Aortic atherosclerosis and tortuosity. Left lung is clear. Some emphysematous changes at the left apex. More extensive emphysema within the right lung. Increased markings in the right lung could reflect a right pneumonia. Consider two-view chest radiography when possible. IMPRESSION: Underlying emphysema, worse on the right than the left. Increased markings in the right lung neck could indicate early pneumonia. Consider two-view chest radiography when able. Electronically Signed   By: Nelson Chimes M.D.   On: 05/04/2021 13:01   DG Chest 2 View  Result Date: 05/04/2021 CLINICAL DATA:  Shortness of breath. EXAM: CHEST - 2 VIEW COMPARISON:  Chest x-ray 04/16/2017.  Chest CT 04/14/2017 FINDINGS: Emphysematous changes are again seen throughout both  lungs. There is linear atelectasis or scarring in the left lung base. The lungs are otherwise clear. There is no pleural effusion or pneumothorax. The aorta is tortuous and the heart is mildly enlarged, unchanged. No acute fractures are seen. IVC filter is present. IMPRESSION: 1. No acute cardiopulmonary process. 2.  Emphysema (ICD10-J43.9). Electronically Signed   By: Ronney Asters M.D.   On: 05/04/2021 21:02   CT Head Wo  Contrast  Result Date: 05/04/2021 CLINICAL DATA:  Mental status change, unknown cause. Additional history provided: Weakness in legs, shortness of breath. EXAM: CT HEAD WITHOUT CONTRAST TECHNIQUE: Contiguous axial images were obtained from the base of the skull through the vertex without intravenous contrast. COMPARISON:  MRI brain and MRA head 05/24/2017. head CT 03/20/2018. FINDINGS: Brain: Mild generalized cerebral atrophy. Small lacunar infarct within the right caudate head, new as compared to the head CT of 03/20/2018 but otherwise age indeterminate (series 3, image 27). 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 Background moderately advanced patchy and ill-defined hypoattenuation within the cerebral white matter, nonspecific but compatible with chronic small vessel ischemic disease. There is no acute intracranial hemorrhage. No demarcated cortical infarct. No extra-axial fluid collection. No evidence of an intracranial mass. No midline shift. Vascular: No hyperdense vessel.  Atherosclerotic calcifications. Skull: Normal. Negative for fracture or focal lesion. Sinuses/Orbits: Visualized orbits show no acute finding. Complete opacification of the left frontal sinus. Frothy secretions within a posterior left ethmoid air cell. IMPRESSION: No acute intracranial hemorrhage or acute demarcated cortical infarction. Small lacunar infarct within the right basal ganglia, new from the prior head CT of 03/20/2018 but otherwise  age-indeterminate. A brain MRI may be obtained for further evaluation, as clinically warranted. Moderately advanced chronic small vessel ischemic disease, as described. Mild generalized cerebral atrophy. Paranasal sinus disease, as outlined. Electronically Signed   By: Kellie Simmering D.O.   On: 05/04/2021 12:52   MR BRAIN WO CONTRAST  Result Date: 05/04/2021 CLINICAL DATA:  Initial evaluation for neuro deficit, stroke suspected. EXAM: MRI HEAD WITHOUT CONTRAST TECHNIQUE: Multiplanar, multiecho pulse sequences of the brain and surrounding structures were obtained without intravenous contrast. COMPARISON:  CT from earlier the same day. FINDINGS: Brain: Generalized age-related cerebral atrophy. Patchy and confluent T2/FLAIR hyperintensity involving the periventricular and deep white matter both cerebral hemispheres as well as the pons, most consistent with chronic small vessel ischemic disease, moderately advanced in nature. Few scattered superimposed remote lacunar infarcts present about the hemispheric cerebral white matter, basal ganglia, and thalami. Few probable tiny remote cerebellar infarcts noted as well. No abnormal foci of restricted diffusion to suggest acute or subacute ischemia. Gray-white matter differentiation maintained. No encephalomalacia to suggest chronic cortical infarction. No evidence for acute or chronic intracranial hemorrhage. No mass lesion, midline shift or mass effect. Mild ventricular prominence related to global parenchymal volume loss of hydrocephalus. No extra-axial fluid collection. Pituitary gland suprasellar region within normal limits. Midline structures intact. Vascular: Major intracranial vascular flow voids are maintained. Major intracranial arterial vasculature appears somewhat dolichoectatic in nature. Skull and upper cervical spine: Craniocervical junction within normal limits. Bone marrow signal intensity diffusely heterogeneous without focal marrow replacing lesion. No  scalp soft tissue abnormality. Sinuses/Orbits: Prior ocular lens replacement noted on the left. Globes and orbital soft tissues demonstrate no acute finding. Chronic left frontal and ethmoidal sinusitis noted. Small right mastoid effusion noted, of doubtful significance. Visualized nasopharynx unremarkable. Other: None. IMPRESSION: 1. No acute intracranial abnormality. 2. Age-related cerebral atrophy with moderately advanced chronic microvascular ischemic disease. 3. Chronic left frontal and ethmoidal sinusitis. Electronically Signed   By: Jeannine Boga M.D.   On: 05/04/2021 20:13   MR THORACIC SPINE W WO CONTRAST  Result Date: 05/04/2021 CLINICAL DATA:  Initial evaluation for mid back pain, lower extremity weakness. EXAM: MRI THORACIC WITHOUT AND WITH CONTRAST TECHNIQUE: Multiplanar and multiecho pulse sequences of the thoracic spine were obtained without and with intravenous  contrast. CONTRAST:  2mL GADAVIST GADOBUTROL 1 MMOL/ML IV SOLN COMPARISON:  Previous MRI from 05/12/2018. FINDINGS: Alignment: Physiologic with preservation of the normal thoracic kyphosis. No listhesis. Vertebrae: Chronic compression fracture involving the T12 vertebral body with up to approximate 50% height loss and trace 2 mm bony retropulsion. Otherwise, vertebral body height maintained with no other acute or subacute fracture. Bone marrow signal intensity mildly heterogeneous but overall within normal limits. No worrisome osseous lesions. No abnormal marrow edema or enhancement. Cord:  Normal signal morphology.  No abnormal enhancement. Paraspinal and other soft tissues: Paraspinous soft tissues demonstrate no acute finding. Small layering bilateral pleural effusions with associated atelectasis noted. Disc levels: T9-10: Negative interspace. Bilateral facet hypertrophy. No spinal stenosis. Mild left greater than right foraminal stenosis. T10-11: Degenerative disc bulge with bilateral facet hypertrophy. No significant spinal  stenosis. Moderate bilateral foraminal narrowing. Otherwise, no other significant disc pathology seen within the thoracic spine for age. No spinal stenosis. Foramina otherwise remain patent. IMPRESSION: 1. No acute abnormality within the thoracic spine. No findings to explain patient's symptoms. 2. Chronic compression fracture involving the T12 vertebral body with up to 50% height loss and trace 2 mm bony retropulsion. 3. Mild to moderate bilateral foraminal stenosis at T9-10 and T10-11, largely due to facet hypertrophy. No significant spinal stenosis within the thoracic spine. 4. Small layering bilateral pleural effusions with associated atelectasis. Electronically Signed   By: Jeannine Boga M.D.   On: 05/04/2021 20:23   MR Lumbar Spine W Wo Contrast  Result Date: 05/04/2021 CLINICAL DATA:  Initial evaluation for low back pain, prior surgery, lower extremity weakness. EXAM: MRI LUMBAR SPINE WITHOUT AND WITH CONTRAST TECHNIQUE: Multiplanar and multiecho pulse sequences of the lumbar spine were obtained without and with intravenous contrast. CONTRAST:  45mL GADAVIST GADOBUTROL 1 MMOL/ML IV SOLN COMPARISON:  Previous MRI from 05/12/2018. FINDINGS: Segmentation: Standard. Same numbering system employed as on previous exam. Alignment: Chronic 4 mm anterolisthesis of L4 on L5. Trace retrolisthesis of L1 on L2, L2 on L3, L3 on L4. Alignment otherwise normal with preservation of the normal lumbar lordosis. Vertebrae: Susceptibility artifact from prior PLIF at L5-S1. Postoperative changes noted about the partially visualized left iliac wing and left SI joint. Chronic T12 compression fracture noted, better evaluated on concomitant MRI of the thoracic spine. Vertebral body height otherwise maintained with no other acute or chronic fracture. Bone marrow signal intensity diffusely heterogeneous. No worrisome osseous lesions. No MRA evidence for active osteomyelitis discitis within the lumbar spine. Reactive marrow  edema and enhancement about the left aspect of the L1-2 interspace felt to be degenerative in nature, with no significant signal abnormality within the intervening L1-2 interspace. 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 Conus medullaris and cauda equina: Conus extends to the L1 level. Conus medullaris within normal limits. Nerve roots of the cauda equina are somewhat irregular and undulating related to distal stenosis. Paraspinal and other soft tissues: Chronic postoperative changes present within the posterior paraspinous soft tissues. Small benign appearing cyst partially visualize within the interpolar left kidney. Visualized visceral structures otherwise unremarkable. Disc levels: L1-2: Disc bulge with disc desiccation and intervertebral disc space narrowing. Disc bulging asymmetric to the left. Prominent reactive endplate change, also worse on the left. Superimposed left foraminal to extraforaminal disc protrusion closely approximates the exiting left L1 nerve root (series 26, image 14). Mild bilateral facet hypertrophy. Resultant mild canal with moderate left lateral recess stenosis. Mild right with moderate left L1 foraminal narrowing. L2-3:  Disc desiccation with mild disc bulge. Disc bulging asymmetric to the left. Associated reactive endplate spurring. Moderate facet and ligament flavum hypertrophy. Resultant fairly severe spinal stenosis with the thecal sac measuring 7 mm in AP diameter at its most narrow point. Mild right with moderate left L2 foraminal stenosis. L3-4: Severe degenerative intervertebral disc space narrowing with ankylosis, progressed from prior, likely combination of degenerative changes in sequelae of prior osteomyelitis discitis. Associated reactive endplate change with marginal endplate osteophytic spurring. Moderate facet hypertrophy with ankylosis of the L3-4 facets as well. Resultant severe spinal stenosis, with the thecal sac measuring 7 mm in AP  diameter. Moderate bilateral L3 foraminal narrowing. L4-5: Advanced degenerative intervertebral disc space narrowing with diffuse disc bulge, disc desiccation, and reactive endplate spurring. Moderate bilateral facet hypertrophy. Resultant moderate canal with bilateral subarticular stenosis. Mild bilateral L4 foraminal narrowing. L5-S1: Prior PLIF. No residual spinal stenosis. Foramina remain patent. IMPRESSION: 1. No acute abnormality within the lumbar spine. 2. 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 No convincing evidence for active infection within the lumbar spine. 3. Multifactorial degenerative changes at L2-3 and L3-4 with resultant severe spinal stenosis, with moderate bilateral L3 foraminal narrowing. 4. Left eccentric disc osteophyte and facet hypertrophy at L1-2 with resultant moderate left lateral recess and foraminal stenosis. Reactive endplate edema and enhancement at this level favored to be degenerative. 5. Prior PLIF at L5-S1 without residual or recurrent stenosis. Electronically Signed   By: Jeannine Boga M.D.   On: 05/04/2021 20:44   US RENAL  Result Date: 05/04/2021 CLINICAL DATA:  Acute kidney injury. EXAM: RENAL / URINARY TRACT ULTRASOUND COMPLETE COMPARISON:  CT abdomen and pelvis 05/12/2018. FINDINGS: Right Kidney: Renal measurements: 10.3 x 3.8 x 5.0 cm = volume: 102 mL. Echogenicity within normal limits. No mass or hydronephrosis visualized. Left Kidney: Renal measurements: 11.2 x 5.7 x 4.3 cm = volume: 145 mL. Echogenicity within normal limits. No mass or hydronephrosis visualized. Bladder: Appears normal for degree of bladder distention. Other: None. IMPRESSION: Within normal limits. Electronically Signed   By: Ronney Asters M.D.   On: 05/04/2021 20:05   DG Hip Unilat W or Wo Pelvis 2-3 Views Right  Result Date: 05/04/2021 CLINICAL DATA:  Shortness of breath.  [place] getting out of bed. EXAM: DG HIP (WITH OR WITHOUT PELVIS) 2-3V  RIGHT COMPARISON:  None. 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 No evidence of acute fracture. Chronic joint space narrowing at both hip joints. Previous lumbosacral decompression and fusion and previous ORIF in the region of the left iliac bone. IVC filter is noted. IMPRESSION: No acute or traumatic finding. Chronic changes of Paget's disease. Old operative changes as above. Electronically Signed   By: Nelson Chimes M.D.   On: 05/04/2021 12:59   DG FEMUR MIN 2 VIEWS LEFT  Result Date: 05/04/2021 CLINICAL DATA:  Weakness. EXAM: LEFT FEMUR 2 VIEWS COMPARISON:  March 20, 2018. FINDINGS: There is no evidence of fracture or other focal bone lesions. Soft tissues are unremarkable. IMPRESSION: Negative. Electronically Signed   By: Marijo Conception M.D.   On: 05/04/2021 15:08       LOS: 1 day   [facility] Pager on [email or web address]  05/05/2021, 9:32 AM

## 2021-05-05 NOTE — ED Notes (Signed)
Norman Davis sister [phone] requesting an update on the patient

## 2021-05-05 NOTE — ED Notes (Signed)
Pt transported to [facility] via stretcher at this time.  

## 2021-05-05 NOTE — Progress Notes (Signed)
Carotid artery duplex has been completed. Preliminary results can be found in CV Proc through chart review.   05/05/21 1:36 PM Olen Cordial RVT

## 2021-05-05 NOTE — Progress Notes (Addendum)
°   05/05/21 1158  TOC ED Mini Assessment  TOC Time spent with patient (minutes): 10  TOC Time saved using PING (minutes): 20  PING Used in TOC Assessment Yes  Admission or Readmission Diverted No  What brought you to the Emergency Department?  Short of breath     Transition of Care [facility]) Screening Note   Patient Details  Name: Norman Davis Date of Birth: 1940-10-03   Transition of Care [facility]) CM/SW Contact:    Fuller Mandril, RN Phone Number: [phone] 05/05/2021, 11:59 AM    Transition of Care Department [facility]) has reviewed patient and no TOC needs have been identified at this time. We will continue to monitor patient advancement through interdisciplinary progression rounds. If new patient transition needs arise, please place a TOC for consult to unit [facility] team.

## 2021-05-06 LAB — BASIC METABOLIC PANEL
Anion gap: 6 (ref 5–15)
BUN: 16 mg/dL (ref 8–23)
CO2: 22 mmol/L (ref 22–32)
Calcium: 8.9 mg/dL (ref 8.9–10.3)
Chloride: 107 mmol/L (ref 98–111)
Creatinine, Ser: 1.12 mg/dL (ref 0.61–1.24)
GFR, Estimated: >60 mL/min (ref >60)
Glucose, Bld: 129 mg/dL — ABNORMAL HIGH (ref 70–99)
Potassium: 4.3 mmol/L (ref 3.5–5.1)
Sodium: 135 mmol/L (ref 135–145)

## 2021-05-06 LAB — CBC
HCT: 34.1 % — ABNORMAL LOW (ref 39.0–52.0)
Hemoglobin: 11.2 g/dL — ABNORMAL LOW (ref 13.0–17.0)
MCH: 28.2 pg (ref 26.0–34.0)
MCHC: 32.8 g/dL (ref 30.0–36.0)
MCV: 85.9 fL (ref 80.0–100.0)
Platelets: 111 10*3/uL — ABNORMAL LOW (ref 150–400)
RBC: 3.97 MIL/uL — ABNORMAL LOW (ref 4.22–5.81)
RDW: 15 % (ref 11.5–15.5)
WBC: 5.3 10*3/uL (ref 4.0–10.5)
nRBC: 0 % (ref 0.0–0.2)

## 2021-05-06 LAB — LEGIONELLA PNEUMOPHILA SEROGP 1 UR AG: L. pneumophila Serogp 1 Ur Ag: NEGATIVE

## 2021-05-06 LAB — PROCALCITONIN: Procalcitonin: <0.1 ng/mL

## 2021-05-06 MED ORDER — DORZOLAMIDE HCL-TIMOLOL MAL PF 22.3-6.8 MG/ML OP SOLN: 1.0000 [drp] | Freq: Two times a day (BID) | OPHTHALMIC | Status: DC

## 2021-05-06 MED ORDER — TIMOLOL MALEATE 0.5 % OP SOLN
1.0000 [drp] | Freq: Two times a day (BID) | OPHTHALMIC | Status: DC
  Administered 2021-05-06 – 2021-05-10 (×9): 1 [drp] via OPHTHALMIC
  Filled 2021-05-06: qty 5

## 2021-05-06 MED ORDER — DORZOLAMIDE HCL 2 % OP SOLN
1.0000 [drp] | Freq: Two times a day (BID) | OPHTHALMIC | Status: DC
  Administered 2021-05-06 – 2021-05-10 (×9): 1 [drp] via OPHTHALMIC
  Filled 2021-05-06: qty 10

## 2021-05-06 MED ORDER — PREGABALIN 25 MG PO CAPS
25.0000 mg | ORAL_CAPSULE | Freq: Every day | ORAL | Status: DC
  Administered 2021-05-06 – 2021-05-09 (×4): 25 mg via ORAL
  Filled 2021-05-06 (×4): qty 1

## 2021-05-06 MED ORDER — LOSARTAN POTASSIUM 50 MG PO TABS
100.0000 mg | ORAL_TABLET | Freq: Every day | ORAL | Status: DC
  Administered 2021-05-06 – 2021-05-09 (×4): 100 mg via ORAL
  Filled 2021-05-06 (×4): qty 2

## 2021-05-06 NOTE — TOC Initial Note (Signed)
Transition of Care [facility]) - Initial/Assessment Note    Patient Details  Name: Norman Davis MRN: [id number] Date of Birth: Jul 20, 1940  Transition of Care [facility]) CM/SW Contact:    Ralene Bathe, LCSWA Phone Number: 05/06/2021, 12:52 PM  Clinical Narrative:                 CSW received consult for possible SNF placement at time of discharge. CSW spoke with patient. Patient reports that he lives with his sister.  Patient expressed understanding of PT recommendation and is agreeable to SNF placement at time of discharge. Patient reports preference for a facility in [place]. CSW discussed insurance authorization process and will provide Medicare SNF ratings list. Patient has received 0 COVID vaccines. CSW will send out referrals for review.   Skilled Nursing Rehab Facilities-   [email or web address]   Ratings out of 5 possible   Name Address  Phone # Quality Care Staffing Health Inspection Overall  [facility] [address], [place] [phone] 5 5 2 4   [facility]  [address], [place] [phone] 4 2 5 5   [facility] [address] [place], [address] [place] 4 1 1 1   [facility] [address] 2 2 4 4   [facility] [address], [phone] 2 1 1 1   [facility] [phone] [address], [phone] 3 1 4 3   [facility] [address] [place] 5 2 2 3   [facility] [address] [facility] [place] 4 1 2 1   [facility] [address] [facility] 5 1 2 2   [facility] [phone] [address], [phone] 4 1 1 1   [facility] [address], [facility] [phone] 4 1 2 1   [facility]. [address] 4 1 1 1       Expected Discharge Plan: Skilled Nursing Facility Barriers to Discharge: SNF Pending bed offer, Continued  Medical Work up, Insurance Authorization   Patient Goals and CMS Choice Patient states their goals for this hospitalization and ongoing recovery are:: To walk better CMS Medicare.gov Compare Post Acute Care list provided to:: Patient Choice offered to / list presented to : Patient  Expected Discharge Plan and Services Expected Discharge Plan: Skilled Nursing Facility In-house Referral: Clinical Social Work   Post Acute Care Choice: Skilled Nursing Facility Living arrangements for the past 2 months: Single Family Home                                      Prior Living Arrangements/Services Living arrangements for the past 2 months: Single Family Home Lives with:: Self, Siblings Patient language and need for interpreter reviewed:: Yes Do you feel safe going back to the place where you live?: Yes      Need for Family Participation in Patient Care: Yes (Comment) Care giver support system in place?: Yes (comment)   Criminal Activity/Legal Involvement Pertinent to Current Situation/Hospitalization: No - Comment as needed  Activities of Daily Living Home Assistive Devices/Equipment: Cane (specify quad or straight), Walker (specify type) ADL Screening (condition at time of admission) Patient's cognitive ability adequate to safely complete daily activities?: Yes Is the patient deaf or have difficulty hearing?: No Does the patient have difficulty seeing, even when wearing glasses/contacts?: No Does the patient have difficulty concentrating, remembering, or making decisions?: No Patient able  to express need for assistance with ADLs?: Yes Does the patient have difficulty dressing or bathing?: No Independently performs ADLs?: Yes (appropriate for developmental age) Does the patient have difficulty walking or climbing stairs?: No Weakness of Legs: None Weakness of Arms/Hands: None  Permission Sought/Granted   Permission granted to share information with : Yes, Verbal Permission  Granted     Permission granted to share info w AGENCY: SNF        Emotional Assessment Appearance:: Appears older than stated age Attitude/Demeanor/Rapport: Engaged Affect (typically observed): Accepting Orientation: : Oriented to Self, Oriented to Place, Oriented to  Time, Oriented to Situation Alcohol / Substance Use: Not Applicable Psych Involvement: No (comment)  Admission diagnosis:  Weakness [R53.1] SOB (shortness of breath) [R06.02] AKI (acute kidney injury) (HCC) [N17.9] PNA (pneumonia) [J18.9] AMS (altered mental status) [R41.82] Patient Active Problem List   Diagnosis Date Noted   Ambulatory dysfunction 05/04/2021   Altered mental state 05/24/2017   Altered mental status    TIA (transient ischemic attack) 05/23/2017   Coagulase-negative staphylococcal infection    Cerebrovascular accident (CVA) (HCC)    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   Tachycardia 04/12/2017   Spinal stenosis 08/07/2015   Absence of bladder continence 08/07/2015   Pulmonary emphysema (HCC) 08/07/2015   Essential hypertension 08/07/2015   Depression, major, recurrent, moderate (HCC) 07/30/2015    Class: Chronic   GERD without esophagitis 07/28/2015   BPH (benign prostatic hyperplasia) 07/28/2015   UI (urinary incontinence) 07/28/2015   Fall 07/22/2015   HLD (hyperlipidemia) 07/22/2015   Depression 07/22/2015   Rhabdomyolysis 07/22/2015   Hypokalemia 07/22/2015   Elevated CK    COPD exacerbation (HCC) 06/12/2015   Elevated troponin 06/12/2015   Elevated d-dimer    Leg pain, left 04/28/2015   Chest pain 04/28/2015   NSTEMI (non-ST elevated myocardial infarction) (HCC) 04/28/2015   Abdominal pain 04/28/2015   COPD (chronic obstructive pulmonary disease) (HCC) 04/11/2013   Hypercholesteremia    Hypertension    H/O urinary retention    Myocardial infarction (HCC)    Gallstones 03/17/2013   Renal stone 03/17/2013   SBO (small bowel obstruction) (HCC) 03/16/2013   AKI (acute kidney  injury) (HCC) 03/16/2013   High anion gap metabolic acidosis 03/16/2013   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 [place] [address] Phone: [phone] Fax: [phone]     Social Determinants of Health (SDOH) Interventions    Readmission Risk Interventions No flowsheet data found.

## 2021-05-06 NOTE — Progress Notes (Addendum)
[facility] PROGRESS NOTE   Norman Davis [id number] DOB: Sep 03, 1940 DOA: 05/04/2021  PCP: [facility]  Brief History/Interval Summary: 81 y.o. 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 Patient has poorly controlled sciatica with chronic back pain shooting down to lateral side of the right leg.  Patient apparently had several falls as a result of this weakness.  Has had previous back surgery though not yet at home.   CT head raise concern for subacute infarct.  Patient was hospitalized for further management.  Reason for Visit: Right leg weakness with inability to ambulate  Consultants: None at this time  Procedures: None    Subjective/Interval History: Patient mentions that he continues to have back pain radiating down to the leg but seems to be slightly better today.  Able to move his leg.  Denies any chest pain shortness of breath nausea or vomiting.       Assessment/Plan:  Sciatica with right lower extremity weakness with ambulatory dysfunction Patient has had inability to ambulate as a result of his back pain which has been radiating down his right leg.  He has had multiple falls.  Waiting on PT and OT evaluation. MRI of the thoracic and lumbar spine was ordered.  Shows spinal stenosis which is severe at L2-3 and L3-4.  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 No acute findings was noted.  Patient seen by neurosurgery.  They recommend steroids.  They feel that patient is poor surgical candidate and they do not feel that patient needs emergent decompression regardless.  Patient does not know why he is on Plavix.  Would be hesitant to stop Plavix at this time.  Continue with dexamethasone.   PT and OT evaluation.  Continues to have reasonable strength in the lower right lower extremity. CT head raised concern for a subacute infarct although this was on the right hemisphere.  MRI brain does not show any acute stroke.  No further work-up is necessary at this time.  Acute kidney injury/hypokalemia Presented with creatinine of 1.64.  Resolved with IV fluids.   Renal ultrasound did not show any acute findings. Potassium level has improved.  Right-sided pneumonia, community-acquired Respiratory status seems to be stable.  Currently on doxycycline.  Procalcitonin less than 0.1.  We will plan for a 5-day course of antibiotics.  Essential hypertension He remains on amlodipine, carvedilol, doxazosin.  Losartan was initially placed on hold due to elevated creatinine.  Now that renal function is normal we can resume.  Normocytic anemia Hemoglobin is stable.  No overt blood loss noted.  History of Paget's disease On bisphosphonate  History of COPD Stable.  DVT Prophylaxis: Subcutaneous heparin Code Status: Full code Family Communication: We will update his sister today. Disposition Plan: Skilled nursing facility recommended by PT and OT  Status is: Inpatient  Remains inpatient appropriate because: Inability to ambulate, unsafe discharge     Medications: Scheduled:  amLODipine  10 mg Oral Daily   atorvastatin  40 mg Oral q1800   carvedilol  3.125 mg Oral BID WC   clopidogrel  75 mg Oral Q breakfast   dexamethasone (DECADRON) injection  4 mg Intravenous Q12H   doxazosin  8 mg Oral QHS   doxycycline  100 mg Oral Q12H   finasteride  5 mg Oral Daily   FLUoxetine  20 mg Oral Daily   folic acid  1 mg Oral Daily   heparin  5,000 Units Subcutaneous Q12H   melatonin  3 mg Oral QHS   mirabegron ER  25 mg Oral Daily   mirtazapine  30 mg Oral QHS   polyethylene glycol  17 g Oral Daily   senna  1 tablet Oral Daily   Continuous:   [id number] **OR** acetaminophen,  albuterol, bisacodyl, fluticasone, ondansetron **OR** ondansetron (ZOFRAN) IV, oxyCODONE  Antibiotics: Anti-infectives (From admission, onward)    Start     Dose/Rate Route Frequency Ordered Stop   05/05/21 2200  doxycycline (VIBRA-TABS) tablet 100 mg        100 mg Oral Every 12 hours 05/04/21 1936     05/05/21 1500  cefTRIAXone (ROCEPHIN) 1 g in sodium chloride 0.9 % 100 mL IVPB  Status:  Discontinued        1 g 200 mL/hr over 30 Minutes Intravenous Every 24 hours 05/04/21 1829 05/04/21 1936   05/04/21 1530  cefTRIAXone (ROCEPHIN) 1 g in sodium chloride 0.9 % 100 mL IVPB        1 g 200 mL/hr over 30 Minutes Intravenous  Once 05/04/21 1529 05/04/21 2326   05/04/21 1530  doxycycline (VIBRA-TABS) tablet 100 mg        100 mg Oral  Once 05/04/21 1529 05/04/21 2155       Objective:  Vital Signs  Vitals:   05/05/21 2141 05/06/21 0430 05/06/21 0904 05/06/21 0904  BP: (!) 162/101 (!) 158/97 (!) 164/104 (!) 164/104  Pulse: 83 73 84 84  Resp:    20  Temp: 98.2 F (36.8 C) 97.8 F (36.6 C)  98 F (36.7 C)  TempSrc:      SpO2: 95% 96%  97%  Weight:      Height:        Intake/Output Summary (Last 24 hours) at 05/06/2021 1024 Last data filed at 05/06/2021 0900 Gross per 24 hour  Intake 120 ml  Output 0 ml  Net 120 ml    Filed Weights   05/04/21 1148  Weight: 72.6 kg    General appearance: Awake alert.  In no distress Resp: Clear to auscultation bilaterally.  Normal effort Cardio: S1-S2 is normal regular.  No S3-S4.  No rubs murmurs or bruit GI: Abdomen is soft.  Nontender nondistended.  Bowel sounds are present normal.  No masses organomegaly Extremities: Able to lift his right leg off the bed. Neurologic: Alert and oriented x3.  No focal neurological deficits.     Lab Results:  Data Reviewed: I have personally reviewed following labs and imaging studies  CBC: Recent Labs  Lab 05/04/21 1318 05/05/21 0220 05/06/21 0315  WBC 8.5 7.5 5.3  NEUTROABS 6.9  --   --    HGB 12.1* 11.5* 11.2*  HCT 37.6* 36.0* 34.1*  MCV 87.9 87.2 85.9  PLT 170 147* 111*     Basic Metabolic Panel: Recent Labs  Lab 05/04/21 1318 05/05/21 0220 05/06/21 0315  NA 143 139 135  K 3.6 3.2* 4.3  CL 108 107 107  CO2 26 23 22   GLUCOSE 97 118* 129*  BUN 18 14 16   CREATININE 1.64* 1.00 1.12  CALCIUM 8.9 8.6* 8.9  MG 1.7  --   --      GFR: Estimated Creatinine Clearance: 50.9 mL/min (by C-G formula based on SCr of 1.12 mg/dL).  Liver Function Tests: Recent Labs  Lab 05/04/21  1318  AST 15  ALT 10  ALKPHOS 180*  BILITOT 1.0  PROT 7.2  ALBUMIN 3.7      Cardiac Enzymes: Recent Labs  Lab 05/04/21 1318  CKTOTAL 104      Recent Results (from the past 240 hour(s))  Resp Panel by RT-PCR (Flu A&B, Covid) Nasopharyngeal Swab     Status: None   Collection Time: 05/05/21  7:45 AM   Specimen: Nasopharyngeal Swab; Nasopharyngeal(NP) swabs in vial transport medium  Result Value Ref Range Status   SARS Coronavirus 2 by RT PCR NEGATIVE NEGATIVE Final    Comment: (NOTE) SARS-CoV-2 target nucleic acids are NOT DETECTED.  The SARS-CoV-2 RNA is generally detectable in upper respiratory specimens during the acute phase of infection. The lowest concentration of SARS-CoV-2 viral copies this assay can detect is 138 copies/mL. A negative result does not preclude SARS-Cov-2 infection and should not be used as the sole basis for treatment or other patient management decisions. A negative result may occur with  improper specimen collection/handling, submission of specimen other than nasopharyngeal swab, presence of viral mutation(s) within the areas targeted by this assay, and inadequate number of viral copies(<138 copies/mL). A negative result must be combined with clinical observations, patient history, and epidemiological information. The expected result is Negative.  Fact Sheet for Patients:  EntrepreneurPulse.com.au  Fact Sheet for Healthcare  Providers:  IncredibleEmployment.be  This test is no t yet approved or cleared by the [place] FDA and  has been authorized for detection and/or diagnosis of SARS-CoV-2 by FDA under an Emergency Use Authorization (EUA). This EUA will remain  in effect (meaning this test can be used) for the duration of the COVID-19 declaration under Section 564(b)(1) of the Act, 21 U.S.C.section 360bbb-3(b)(1), unless the authorization is terminated  or revoked sooner.       Influenza A by PCR NEGATIVE NEGATIVE Final   Influenza B by PCR NEGATIVE NEGATIVE Final    Comment: (NOTE) The Xpert Xpress SARS-CoV-2/FLU/RSV plus assay is intended as an aid in the diagnosis of influenza from Nasopharyngeal swab specimens and should not be used as a sole basis for treatment. Nasal washings and aspirates are unacceptable for Xpert Xpress SARS-CoV-2/FLU/RSV testing.  Fact Sheet for Patients: EntrepreneurPulse.com.au  Fact Sheet for Healthcare Providers: IncredibleEmployment.be  This test is not yet approved or cleared by the [place] FDA and has been authorized for detection and/or diagnosis of SARS-CoV-2 by FDA under an Emergency Use Authorization (EUA). This EUA will remain in effect (meaning this test can be used) for the duration of the COVID-19 declaration under Section 564(b)(1) of the Act, 21 U.S.C. section 360bbb-3(b)(1), unless the authorization is terminated or revoked.  Performed at [facility], [place] [address] [place], [place] [address]        Radiology Studies: DG Chest 1 View  Result Date: 05/04/2021 CLINICAL DATA:  [place] getting out of bed.  Shortness of breath. EXAM: CHEST  1 VIEW COMPARISON:  04/19/2017.  04/16/2017. FINDINGS: Mild cardiomegaly. Aortic atherosclerosis and tortuosity. Left lung is clear. Some emphysematous changes at the left apex. More extensive emphysema within the right lung. Increased markings in  the right lung could reflect a right pneumonia. Consider two-view chest radiography when possible. IMPRESSION: Underlying emphysema, worse on the right than the left. Increased markings in the right lung neck could indicate early pneumonia. Consider two-view chest radiography when able. Electronically Signed   By: Nelson Chimes M.D.   On: 05/04/2021 13:01   DG Chest 2  View  Result Date: 05/04/2021 CLINICAL DATA:  Shortness of breath. EXAM: CHEST - 2 VIEW COMPARISON:  Chest x-ray 04/16/2017.  Chest CT 04/14/2017 FINDINGS: Emphysematous changes are again seen throughout both lungs. There is linear atelectasis or scarring in the left lung base. The lungs are otherwise clear. There is no pleural effusion or pneumothorax. The aorta is tortuous and the heart is mildly enlarged, unchanged. No acute fractures are seen. IVC filter is present. IMPRESSION: 1. No acute cardiopulmonary process. 2.  Emphysema (ICD10-J43.9). Electronically Signed   By: Ronney Asters M.D.   On: 05/04/2021 21:02   CT Head Wo Contrast  Result Date: 05/04/2021 CLINICAL DATA:  Mental status change, unknown cause. Additional history provided: Weakness in legs, shortness of breath. EXAM: CT HEAD WITHOUT CONTRAST TECHNIQUE: Contiguous axial images were obtained from the base of the skull through the vertex without intravenous contrast. COMPARISON:  MRI brain and MRA head 05/24/2017. head CT 03/20/2018. FINDINGS: Brain: Mild generalized cerebral atrophy. Small lacunar infarct within the right caudate head, new as compared to the head CT of 03/20/2018 but otherwise age indeterminate (series 3, image 28). 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 Background moderately advanced patchy and ill-defined hypoattenuation within the cerebral white matter, nonspecific but compatible with chronic small vessel ischemic disease. There is no acute intracranial hemorrhage. No demarcated cortical  infarct. No extra-axial fluid collection. No evidence of an intracranial mass. No midline shift. Vascular: No hyperdense vessel.  Atherosclerotic calcifications. Skull: Normal. Negative for fracture or focal lesion. Sinuses/Orbits: Visualized orbits show no acute finding. Complete opacification of the left frontal sinus. Frothy secretions within a posterior left ethmoid air cell. IMPRESSION: No acute intracranial hemorrhage or acute demarcated cortical infarction. Small lacunar infarct within the right basal ganglia, new from the prior head CT of 03/20/2018 but otherwise age-indeterminate. A brain MRI may be obtained for further evaluation, as clinically warranted. Moderately advanced chronic small vessel ischemic disease, as described. Mild generalized cerebral atrophy. Paranasal sinus disease, as outlined. Electronically Signed   By: Kellie Simmering D.O.   On: 05/04/2021 12:52   MR BRAIN WO CONTRAST  Result Date: 05/04/2021 CLINICAL DATA:  Initial evaluation for neuro deficit, stroke suspected. EXAM: MRI HEAD WITHOUT CONTRAST TECHNIQUE: Multiplanar, multiecho pulse sequences of the brain and surrounding structures were obtained without intravenous contrast. COMPARISON:  CT from earlier the same day. FINDINGS: Brain: Generalized age-related cerebral atrophy. Patchy and confluent T2/FLAIR hyperintensity involving the periventricular and deep white matter both cerebral hemispheres as well as the pons, most consistent with chronic small vessel ischemic disease, moderately advanced in nature. Few scattered superimposed remote lacunar infarcts present about the hemispheric cerebral white matter, basal ganglia, and thalami. Few probable tiny remote cerebellar infarcts noted as well. No abnormal foci of restricted diffusion to suggest acute or subacute ischemia. Gray-white matter differentiation maintained. No encephalomalacia to suggest chronic cortical infarction. No evidence for acute or chronic intracranial  hemorrhage. No mass lesion, midline shift or mass effect. Mild ventricular prominence related to global parenchymal volume loss of hydrocephalus. No extra-axial fluid collection. Pituitary gland suprasellar region within normal limits. Midline structures intact. Vascular: Major intracranial vascular flow voids are maintained. Major intracranial arterial vasculature appears somewhat dolichoectatic in nature. Skull and upper cervical spine: Craniocervical junction within normal limits. Bone marrow signal intensity diffusely heterogeneous without focal marrow replacing lesion. No scalp soft tissue abnormality. Sinuses/Orbits: Prior ocular lens replacement noted on the left. Globes and orbital soft tissues demonstrate no acute  finding. Chronic left frontal and ethmoidal sinusitis noted. Small right mastoid effusion noted, of doubtful significance. Visualized nasopharynx unremarkable. Other: None. IMPRESSION: 1. No acute intracranial abnormality. 2. Age-related cerebral atrophy with moderately advanced chronic microvascular ischemic disease. 3. Chronic left frontal and ethmoidal sinusitis. Electronically Signed   By: Jeannine Boga M.D.   On: 05/04/2021 20:13   MR THORACIC SPINE W WO CONTRAST  Result Date: 05/04/2021 CLINICAL DATA:  Initial evaluation for mid back pain, lower extremity weakness. EXAM: MRI THORACIC WITHOUT AND WITH CONTRAST TECHNIQUE: Multiplanar and multiecho pulse sequences of the thoracic spine were obtained without and with intravenous contrast. CONTRAST:  33mL GADAVIST GADOBUTROL 1 MMOL/ML IV SOLN COMPARISON:  Previous MRI from 05/12/2018. FINDINGS: Alignment: Physiologic with preservation of the normal thoracic kyphosis. No listhesis. Vertebrae: Chronic compression fracture involving the T12 vertebral body with up to approximate 50% height loss and trace 2 mm bony retropulsion. Otherwise, vertebral body height maintained with no other acute or subacute fracture. Bone marrow signal intensity  mildly heterogeneous but overall within normal limits. No worrisome osseous lesions. No abnormal marrow edema or enhancement. Cord:  Normal signal morphology.  No abnormal enhancement. Paraspinal and other soft tissues: Paraspinous soft tissues demonstrate no acute finding. Small layering bilateral pleural effusions with associated atelectasis noted. Disc levels: T9-10: Negative interspace. Bilateral facet hypertrophy. No spinal stenosis. Mild left greater than right foraminal stenosis. T10-11: Degenerative disc bulge with bilateral facet hypertrophy. No significant spinal stenosis. Moderate bilateral foraminal narrowing. Otherwise, no other significant disc pathology seen within the thoracic spine for age. No spinal stenosis. Foramina otherwise remain patent. IMPRESSION: 1. No acute abnormality within the thoracic spine. No findings to explain patient's symptoms. 2. Chronic compression fracture involving the T12 vertebral body with up to 50% height loss and trace 2 mm bony retropulsion. 3. Mild to moderate bilateral foraminal stenosis at T9-10 and T10-11, largely due to facet hypertrophy. No significant spinal stenosis within the thoracic spine. 4. Small layering bilateral pleural effusions with associated atelectasis. Electronically Signed   By: Jeannine Boga M.D.   On: 05/04/2021 20:23   MR Lumbar Spine W Wo Contrast  Result Date: 05/04/2021 CLINICAL DATA:  Initial evaluation for low back pain, prior surgery, lower extremity weakness. EXAM: MRI LUMBAR SPINE WITHOUT AND WITH CONTRAST TECHNIQUE: Multiplanar and multiecho pulse sequences of the lumbar spine were obtained without and with intravenous contrast. CONTRAST:  8mL GADAVIST GADOBUTROL 1 MMOL/ML IV SOLN COMPARISON:  Previous MRI from 05/12/2018. FINDINGS: Segmentation: Standard. Same numbering system employed as on previous exam. Alignment: Chronic 4 mm anterolisthesis of L4 on L5. Trace retrolisthesis of L1 on L2, L2 on L3, L3 on L4. Alignment  otherwise normal with preservation of the normal lumbar lordosis. Vertebrae: Susceptibility artifact from prior PLIF at L5-S1. Postoperative changes noted about the partially visualized left iliac wing and left SI joint. Chronic T12 compression fracture noted, better evaluated on concomitant MRI of the thoracic spine. Vertebral body height otherwise maintained with no other acute or chronic fracture. Bone marrow signal intensity diffusely heterogeneous. No worrisome osseous lesions. No MRA evidence for active osteomyelitis discitis within the lumbar spine. Reactive marrow edema and enhancement about the left aspect of the L1-2 interspace felt to be degenerative in nature, with no significant signal abnormality within the intervening L1-2 interspace. 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 Conus medullaris and cauda equina: Conus extends to the L1 level. Conus medullaris within normal limits. Nerve roots of the cauda equina are somewhat irregular  and undulating related to distal stenosis. Paraspinal and other soft tissues: Chronic postoperative changes present within the posterior paraspinous soft tissues. Small benign appearing cyst partially visualize within the interpolar left kidney. Visualized visceral structures otherwise unremarkable. Disc levels: L1-2: Disc bulge with disc desiccation and intervertebral disc space narrowing. Disc bulging asymmetric to the left. Prominent reactive endplate change, also worse on the left. Superimposed left foraminal to extraforaminal disc protrusion closely approximates the exiting left L1 nerve root (series 26, image 14). Mild bilateral facet hypertrophy. Resultant mild canal with moderate left lateral recess stenosis. Mild right with moderate left L1 foraminal narrowing. L2-3: Disc desiccation with mild disc bulge. Disc bulging asymmetric to the left. Associated reactive endplate spurring. Moderate facet and ligament flavum hypertrophy.  Resultant fairly severe spinal stenosis with the thecal sac measuring 7 mm in AP diameter at its most narrow point. Mild right with moderate left L2 foraminal stenosis. L3-4: Severe degenerative intervertebral disc space narrowing with ankylosis, progressed from prior, likely combination of degenerative changes in sequelae of prior osteomyelitis discitis. Associated reactive endplate change with marginal endplate osteophytic spurring. Moderate facet hypertrophy with ankylosis of the L3-4 facets as well. Resultant severe spinal stenosis, with the thecal sac measuring 7 mm in AP diameter. Moderate bilateral L3 foraminal narrowing. L4-5: Advanced degenerative intervertebral disc space narrowing with diffuse disc bulge, disc desiccation, and reactive endplate spurring. Moderate bilateral facet hypertrophy. Resultant moderate canal with bilateral subarticular stenosis. Mild bilateral L4 foraminal narrowing. L5-S1: Prior PLIF. No residual spinal stenosis. Foramina remain patent. IMPRESSION: 1. No acute abnormality within the lumbar spine. 2. 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 No convincing evidence for active infection within the lumbar spine. 3. Multifactorial degenerative changes at L2-3 and L3-4 with resultant severe spinal stenosis, with moderate bilateral L3 foraminal narrowing. 4. Left eccentric disc osteophyte and facet hypertrophy at L1-2 with resultant moderate left lateral recess and foraminal stenosis. Reactive endplate edema and enhancement at this level favored to be degenerative. 5. Prior PLIF at L5-S1 without residual or recurrent stenosis. Electronically Signed   By: Jeannine Boga M.D.   On: 05/04/2021 20:44   US RENAL  Result Date: 05/04/2021 CLINICAL DATA:  Acute kidney injury. EXAM: RENAL / URINARY TRACT ULTRASOUND COMPLETE COMPARISON:  CT abdomen and pelvis 05/12/2018. FINDINGS: Right Kidney: Renal measurements: 10.3 x 3.8 x 5.0 cm =  volume: 102 mL. Echogenicity within normal limits. No mass or hydronephrosis visualized. Left Kidney: Renal measurements: 11.2 x 5.7 x 4.3 cm = volume: 145 mL. Echogenicity within normal limits. No mass or hydronephrosis visualized. Bladder: Appears normal for degree of bladder distention. Other: None. IMPRESSION: Within normal limits. Electronically Signed   By: Ronney Asters M.D.   On: 05/04/2021 20:05   ECHOCARDIOGRAM COMPLETE  Result Date: 05/05/2021    ECHOCARDIOGRAM REPORT   Patient Name:   Norman Davis Date of Exam: 05/05/2021 Medical Rec #:  [id number]   Height:       69.0 in Accession #:    [id number]  Weight:       160.0 lb Date of Birth:  Jun 04, 1940   BSA:          1.879 m Patient Age:    33 years    BP:           132/89 mmHg Patient Gender: M           HR:           90 bpm. Exam Location:  Inpatient Procedure: 2D Echo, Cardiac Doppler and Color Doppler Indications:    stroke  History:        Patient has prior history of Echocardiogram examinations, most                 recent 04/13/2017. Signs/Symptoms:Shortness of Breath; Risk                 Factors:Hypertension and Dyslipidemia. [place].  Sonographer:    Beryle Beams Referring Phys: [id number] [place]  1. Left ventricular ejection fraction, by estimation, is 50 to 55%. The left ventricle has low normal function. The left ventricle has no regional wall motion abnormalities. There is mild asymmetric left ventricular hypertrophy of the basal-septal segment. Left ventricular diastolic parameters are consistent with Grade I diastolic dysfunction (impaired relaxation).  2. Right ventricular systolic function is normal. The right ventricular size is normal. Tricuspid regurgitation signal is inadequate for assessing PA pressure.  3. The mitral valve is degenerative. Trivial mitral valve regurgitation. No evidence of mitral stenosis.  4. The aortic valve was not well visualized. Aortic valve regurgitation is not visualized. Aortic valve sclerosis  is present, with no evidence of aortic valve stenosis.  5. Aortic dilatation noted. There is mild dilatation of the aortic root, measuring 38 mm. Comparison(s): Compared to prior TTE in 2018, there is no significant change. Conclusion(s)/Recommendation(s): No intracardiac source of embolism detected on this transthoracic study. Consider a transesophageal echocardiogram to exclude cardiac source of embolism if clinically indicated. FINDINGS  Left Ventricle: Left ventricular ejection fraction, by estimation, is 50 to 55%. The left ventricle has low normal function. The left ventricle has no regional wall motion abnormalities. The left ventricular internal cavity size was normal in size. There is mild asymmetric left ventricular hypertrophy of the basal-septal segment. Left ventricular diastolic parameters are consistent with Grade I diastolic dysfunction (impaired relaxation). Right Ventricle: The right ventricular size is normal. Right vetricular wall thickness was not well visualized. Right ventricular systolic function is normal. Tricuspid regurgitation signal is inadequate for assessing PA pressure. Left Atrium: Left atrial size was normal in size. Right Atrium: Right atrial size was normal in size. Pericardium: There is no evidence of pericardial effusion. Mitral Valve: The mitral valve is degenerative in appearance. There is mild thickening of the mitral valve leaflet(s). There is mild calcification of the mitral valve leaflet(s). Mild to moderate mitral annular calcification. Trivial mitral valve regurgitation. No evidence of mitral valve stenosis. Tricuspid Valve: The tricuspid valve is normal in structure. Tricuspid valve regurgitation is trivial. Aortic Valve: The aortic valve was not well visualized. Aortic valve regurgitation is not visualized. Aortic valve sclerosis is present, with no evidence of aortic valve stenosis. Aortic valve mean gradient measures 3.0 mmHg. Aortic valve peak gradient measures 5.4  mmHg. Aortic valve area, by VTI measures 2.13 cm. Pulmonic Valve: The pulmonic valve was not well visualized. Aorta: Aortic dilatation noted. There is mild dilatation of the aortic root, measuring 38 mm. Venous: The inferior vena cava was not well visualized. IAS/Shunts: The atrial septum is grossly normal.  LEFT VENTRICLE PLAX 2D LVIDd:         3.51 cm     Diastology LVIDs:         2.58 cm     LV e' medial:    6.53 cm/s LV PW:         1.12 cm     LV E/e' medial:  10.0 LV IVS:  0.89 cm     LV e' lateral:   9.57 cm/s LVOT diam:     2.20 cm     LV E/e' lateral: 6.8 LV SV:         49 LV SV Index:   26 LVOT Area:     3.80 cm  LV Volumes (MOD) LV vol d, MOD A2C: 92.6 ml LV vol s, MOD A2C: 39.6 ml LV vol s, MOD A4C: 35.5 ml LV SV MOD A2C:     53.0 ml RIGHT VENTRICLE RV S prime:     9.68 cm/s TAPSE (M-mode): 1.9 cm LEFT ATRIUM           Index        RIGHT ATRIUM           Index LA diam:      2.90 cm 1.54 cm/m   RA Area:     11.80 cm 6.28 cm/m LA Vol (A2C): 43.6 ml 23.20 ml/m LA Vol (A4C): 80.8 ml 43.00 ml/m  AORTIC VALVE                    PULMONIC VALVE AV Area (Vmax):    2.78 cm     RVOT Peak grad: 1 mmHg AV Area (Vmean):   2.65 cm AV Area (VTI):     2.13 cm AV Vmax:           116.00 cm/s AV Vmean:          77.100 cm/s AV VTI:            0.232 m AV Peak Grad:      5.4 mmHg AV Mean Grad:      3.0 mmHg LVOT Vmax:         84.90 cm/s LVOT Vmean:        53.800 cm/s LVOT VTI:          0.130 m LVOT/AV VTI ratio: 0.56  AORTA Ao Root diam: 3.70 cm MITRAL VALVE MV Area (PHT): 5.27 cm    SHUNTS MV Decel Time: 144 msec    Systemic VTI:  0.13 m MV E velocity: 65.10 cm/s  Systemic Diam: 2.20 cm MV A velocity: 49.70 cm/s  Pulmonic VTI:  0.065 m MV E/A ratio:  1.31 Laurance Flatten MD Electronically signed by Laurance Flatten MD Signature Date/Time: 05/05/2021/1:03:34 PM    Final    DG Hip Unilat W or Wo Pelvis 2-3 Views Right  Result Date: 05/04/2021 CLINICAL DATA:  Shortness of breath.  Larey Seat getting out of bed.  EXAM: DG HIP (WITH OR WITHOUT PELVIS) 2-3V RIGHT COMPARISON:  None. 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 No evidence of acute fracture. Chronic joint space narrowing at both hip joints. Previous lumbosacral decompression and fusion and previous ORIF in the region of the left iliac bone. IVC filter is noted. IMPRESSION: No acute or traumatic finding. Chronic changes of Paget's disease. Old operative changes as above. Electronically Signed   By: Paulina Fusi M.D.   On: 05/04/2021 12:59   DG FEMUR MIN 2 VIEWS LEFT  Result Date: 05/04/2021 CLINICAL DATA:  Weakness. EXAM: LEFT FEMUR 2 VIEWS COMPARISON:  March 20, 2018. FINDINGS: There is no evidence of fracture or other focal bone lesions. Soft tissues are unremarkable. IMPRESSION: Negative. Electronically Signed   By: Lupita Raider M.D.   On: 05/04/2021 15:08   VAS US CAROTID  Result Date: 05/05/2021 Carotid Arterial Duplex Study Patient Name:  Norman Davis  Date  of Exam:   05/05/2021 Medical Rec #: [id number]    Accession #:    [id number] Date of Birth: 1940/07/18    Patient Gender: M Patient Age:   71 years Exam Location:  [facility] Procedure:      VAS US CAROTID Referring Phys: Wynetta Fines --------------------------------------------------------------------------------  Indications:       CVA. Risk Factors:      Hypertension, hyperlipidemia. Comparison Study:  05/24/2017 - Right Carotid: Velocities in the right ICA are                    consistent with a 1-39% stenosis.                     Left Carotid: Velocities in the left ICA are consistent with                    a 1-39%                    stenosis.                     Vertebrals: Both vertebral arteries were patent with                    antegrade flow.                    Subclavians: Normal flow hemodynamics were seen in bilateral                    subclavian arteries. Performing Technologist: Oliver Hum RVT  Examination Guidelines: A complete evaluation  includes B-mode imaging, spectral Doppler, color Doppler, and power Doppler as needed of all accessible portions of each vessel. Bilateral testing is considered an integral part of a complete examination. Limited examinations for reoccurring indications may be performed as noted.  Right Carotid Findings: +----------+--------+--------+--------+-----------------------+--------+             PSV cm/s EDV cm/s Stenosis Plaque Description      Comments  +----------+--------+--------+--------+-----------------------+--------+  CCA Prox   88       28                smooth and heterogenous           +----------+--------+--------+--------+-----------------------+--------+  CCA Distal 55       16                smooth and heterogenous           +----------+--------+--------+--------+-----------------------+--------+  ICA Prox   37       16                smooth and heterogenous           +----------+--------+--------+--------+-----------------------+--------+  ICA Distal 51       18                                        tortuous  +----------+--------+--------+--------+-----------------------+--------+  ECA        30       5                                                   +----------+--------+--------+--------+-----------------------+--------+ +----------+--------+-------+--------+-------------------+  PSV cm/s EDV cms Describe Arm Pressure (mmHG)  +----------+--------+-------+--------+-------------------+  Subclavian 40                                             +----------+--------+-------+--------+-------------------+ +---------+--------+--+--------+--+---------+  Vertebral PSV cm/s 29 EDV cm/s 11 Antegrade  +---------+--------+--+--------+--+---------+  Left Carotid Findings: +----------+--------+--------+--------+-----------------------+--------+             PSV cm/s EDV cm/s Stenosis Plaque Description      Comments  +----------+--------+--------+--------+-----------------------+--------+  CCA Prox   81        22                smooth and heterogenous           +----------+--------+--------+--------+-----------------------+--------+  CCA Distal 52       19                smooth and heterogenous           +----------+--------+--------+--------+-----------------------+--------+  ICA Prox   45       11                smooth and heterogenous tortuous  +----------+--------+--------+--------+-----------------------+--------+  ICA Distal 51       19                                        tortuous  +----------+--------+--------+--------+-----------------------+--------+  ECA        41       8                                                   +----------+--------+--------+--------+-----------------------+--------+ +----------+--------+--------+--------+-------------------+             PSV cm/s EDV cm/s Describe Arm Pressure (mmHG)  +----------+--------+--------+--------+-------------------+  Subclavian 71                                              +----------+--------+--------+--------+-------------------+ +---------+--------+--+--------+-+---------+  Vertebral PSV cm/s 33 EDV cm/s 8 Antegrade  +---------+--------+--+--------+-+---------+   Summary: Right Carotid: Velocities in the right ICA are consistent with a 1-39% stenosis. Left Carotid: Velocities in the left ICA are consistent with a 1-39% stenosis. Vertebrals: Bilateral vertebral arteries demonstrate antegrade flow. *See table(s) above for measurements and observations.     Preliminary        LOS: 2 days   [facility] Pager on [email or web address]  05/06/2021, 10:24 AM

## 2021-05-06 NOTE — Progress Notes (Signed)
pT a&ox4. BP (!) 162/101 (BP Location: Left Arm)    Pulse 83    Temp 98.2 F (36.8 C)    Resp 15    Ht 5\' 9"  (1.753 m)    Wt 72.6 kg    SpO2 95%    BMI 23.63 kg/m  Pt placed on tele NSR. No pain or s/s of distress. Bed in lowest position, 2 of 4 4rails up bed alarm on.  Derotha Fishbaugh 2:04 AM 05/06/21

## 2021-05-06 NOTE — Plan of Care (Signed)
?  Problem: Elimination: ?Goal: Will not experience complications related to urinary retention ?Outcome: Progressing ?  ?

## 2021-05-06 NOTE — NC FL2 (Signed)
[place] MEDICAID FL2 LEVEL OF CARE SCREENING TOOL     IDENTIFICATION  Patient Name: Norman Davis Birthdate: [birth year] Sex: male Admission Date (Current Location): 05/04/2021  [facility] [place]   Facility and Address:  [facility], [address], [place], [place] [address]      Provider Number: [id number]  Attending Physician Name and Address:  Osvaldo ShipperKrishnan, Gokul, MD  Relative Name and Phone Number:  Teresa PeltonMatthews,Lillie (Sister)   [phone]    Current Level of Care: Hospital Recommended Level of Care: Skilled Nursing Facility Prior Approval Number:    Date Approved/Denied:   PASRR Number: [id number]  Discharge Plan: SNF    Current Diagnoses: Patient Active Problem List   Diagnosis Date Noted   Ambulatory dysfunction 05/04/2021   Altered mental state 05/24/2017   Altered mental status    TIA (transient ischemic attack) 05/23/2017   Coagulase-negative staphylococcal infection    Cerebrovascular accident (CVA) (HCC)    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   Tachycardia 04/12/2017   Spinal stenosis 08/07/2015   Absence of bladder continence 08/07/2015   Pulmonary emphysema (HCC) 08/07/2015   Essential hypertension 08/07/2015   Depression, major, recurrent, moderate (HCC) 07/30/2015   GERD without esophagitis 07/28/2015   BPH (benign prostatic hyperplasia) 07/28/2015   UI (urinary incontinence) 07/28/2015   Fall 07/22/2015   HLD (hyperlipidemia) 07/22/2015   Depression 07/22/2015   Rhabdomyolysis 07/22/2015   Hypokalemia 07/22/2015   Elevated CK    COPD exacerbation (HCC) 06/12/2015   Elevated troponin 06/12/2015   Elevated d-dimer    Leg pain, left 04/28/2015   Chest pain 04/28/2015   NSTEMI (non-ST elevated myocardial infarction) (HCC) 04/28/2015   Abdominal pain 04/28/2015   COPD (chronic obstructive pulmonary disease) (HCC) 04/11/2013   Hypercholesteremia    Hypertension    H/O urinary retention    Myocardial  infarction (HCC)    Gallstones 03/17/2013   Renal stone 03/17/2013   SBO (small bowel obstruction) (HCC) 03/16/2013   AKI (acute kidney injury) (HCC) 03/16/2013   High anion gap metabolic acidosis 03/16/2013   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) 03/16/2013    Orientation RESPIRATION BLADDER Height & Weight     Self, Time, Situation, Place  Normal Continent, External catheter Weight: 160 lb (72.6 kg) Height:  5\' 9"  (175.3 cm)  BEHAVIORAL SYMPTOMS/MOOD NEUROLOGICAL BOWEL NUTRITION STATUS      Continent Diet (See d/c summary)  AMBULATORY STATUS COMMUNICATION OF NEEDS Skin   Extensive Assist Verbally Normal                       Personal Care Assistance Level of Assistance  Bathing, Dressing, Feeding Bathing Assistance: Maximum assistance Feeding assistance: Independent Dressing Assistance: Limited assistance     Functional Limitations Info  Sight, Hearing, Speech Sight Info: Adequate Hearing Info: Adequate Speech Info: Adequate    SPECIAL CARE FACTORS FREQUENCY  PT (By licensed PT), OT (By licensed OT)     PT Frequency: 5x/week OT Frequency: 5x/week            Contractures Contractures Info: Not present    Additional Factors Info  Code Status, Allergies Code Status Info: Full Allergies Info: Gabapentin   Penicillins   Tramadol   Aspirin           Current Medications (05/06/2021):  This is the current hospital active medication list Current Facility-Administered Medications  Medication Dose Route Frequency Provider Last Rate Last Admin   acetaminophen (TYLENOL) tablet 650  mg  650 mg Oral Q6H PRN Mikey College T, MD       Or   acetaminophen (TYLENOL) suppository 650 mg  650 mg Rectal Q6H PRN Mikey College T, MD       albuterol (PROVENTIL) (2.5 MG/3ML) 0.083% nebulizer solution 2.5 mg  2.5 mg Inhalation Q4H PRN Mikey College T, MD       amLODipine (NORVASC) tablet 10 mg  10 mg Oral Daily Mikey College T, MD   10 mg at 05/06/21 0904   atorvastatin (LIPITOR) tablet 40  mg  40 mg Oral q1800 Mikey College T, MD   40 mg at 05/05/21 1759   bisacodyl (DULCOLAX) EC tablet 5 mg  5 mg Oral Daily PRN Mikey College T, MD       carvedilol (COREG) tablet 3.125 mg  3.125 mg Oral BID WC Mikey College T, MD   3.125 mg at 05/06/21 6812   clopidogrel (PLAVIX) tablet 75 mg  75 mg Oral Q breakfast Mikey College T, MD   75 mg at 05/06/21 0904   dexamethasone (DECADRON) injection 4 mg  4 mg Intravenous Q12H Osvaldo Shipper, MD   4 mg at 05/06/21 0904   timolol (TIMOPTIC) 0.5 % ophthalmic solution 1 drop  1 drop Both Eyes BID Osvaldo Shipper, MD   1 drop at 05/06/21 1211   And   dorzolamide (TRUSOPT) 2 % ophthalmic solution 1 drop  1 drop Both Eyes BID Osvaldo Shipper, MD   1 drop at 05/06/21 1211   doxazosin (CARDURA) tablet 8 mg  8 mg Oral QHS Mikey College T, MD   8 mg at 05/05/21 2153   doxycycline (VIBRA-TABS) tablet 100 mg  100 mg Oral Q12H Mikey College T, MD   100 mg at 05/06/21 0905   finasteride (PROSCAR) tablet 5 mg  5 mg Oral Daily Mikey College T, MD   5 mg at 05/06/21 0905   FLUoxetine (PROZAC) capsule 20 mg  20 mg Oral Daily Mikey College T, MD   20 mg at 05/06/21 0904   fluticasone (FLONASE) 50 MCG/ACT nasal spray 2 spray  2 spray Each Nare QHS PRN Emeline General, MD       folic acid (FOLVITE) tablet 1 mg  1 mg Oral Daily Mikey College T, MD   1 mg at 05/06/21 0904   heparin injection 5,000 Units  5,000 Units Subcutaneous Q12H Mikey College T, MD   5,000 Units at 05/06/21 0911   losartan (COZAAR) tablet 100 mg  100 mg Oral QHS Osvaldo Shipper, MD       melatonin tablet 3 mg  3 mg Oral QHS Mikey College T, MD   3 mg at 05/05/21 2153   mirabegron ER (MYRBETRIQ) tablet 25 mg  25 mg Oral Daily Mikey College T, MD   25 mg at 05/06/21 0905   mirtazapine (REMERON) tablet 30 mg  30 mg Oral QHS Mikey College T, MD   30 mg at 05/05/21 2154   ondansetron (ZOFRAN) tablet 4 mg  4 mg Oral Q6H PRN Emeline General, MD       Or   ondansetron [facility]) injection 4 mg  4 mg Intravenous Q6H PRN Mikey College T, MD        oxyCODONE (Oxy IR/ROXICODONE) immediate release tablet 5 mg  5 mg Oral Q4H PRN Mikey College T, MD   5 mg at 05/06/21 0904   polyethylene glycol (MIRALAX / GLYCOLAX) packet 17 g  17 g Oral Daily Mikey College T,  MD   17 g at 05/06/21 0905   pregabalin (LYRICA) capsule 25 mg  25 mg Oral QHS Osvaldo Shipper, MD       senna [facility]) tablet 8.6 mg  1 tablet Oral Daily Mikey College T, MD   8.6 mg at 05/06/21 6073     Discharge Medications: Please see discharge summary for a list of discharge medications.  Relevant Imaging Results:  Relevant Lab Results:   Additional Information SS#: [id number]; No COVID vaccinations; 5'9" 160lbs  [place] Logie F Caden Fukushima, LCSWA

## 2021-05-07 LAB — MYCOPLASMA PNEUMONIAE ANTIBODY, IGM: Mycoplasma pneumo IgM: <770 U/mL (ref 0–769)

## 2021-05-07 MED ORDER — SENNOSIDES-DOCUSATE SODIUM 8.6-50 MG PO TABS
2.0000 | ORAL_TABLET | Freq: Two times a day (BID) | ORAL | Status: DC
  Administered 2021-05-07 – 2021-05-10 (×5): 2 via ORAL
  Filled 2021-05-07 (×5): qty 2

## 2021-05-07 MED ORDER — POLYETHYLENE GLYCOL 3350 17 G PO PACK
17.0000 g | PACK | Freq: Two times a day (BID) | ORAL | Status: DC
  Administered 2021-05-08 – 2021-05-10 (×3): 17 g via ORAL
  Filled 2021-05-07 (×3): qty 1

## 2021-05-07 NOTE — Progress Notes (Signed)
Physical Therapy Treatment Patient Details Name: Norman Davis MRN: [id number] DOB: Jun 01, 1940 Today's Date: 05/07/2021   History of Present Illness Pt is an 81 y/o male admitted secondary to weakness and falls. MRI negative. PMH includes COPD, schizophrenia, Paget's disease and HTN.    PT Comments    Pt tolerates treatment well, transferring with improved quality and progressing to ambulation this session. Pt remains generally weak and requires assistance to mobilize in to bed due to deficits in core strength. Pt will benefit from continued aggressive mobilization in an effort to improve mobility quality and reduce falls risk. Pt in agreement that short term inpatient rehab remains beneficial to increase strength, improve endurance, and restore his prior level of function.   Recommendations for follow up therapy are one component of a multi-disciplinary discharge planning process, led by the attending physician.  Recommendations may be updated based on patient status, additional functional criteria and insurance authorization.  Follow Up Recommendations  Skilled nursing-short term rehab (<3 hours/day)     Assistance Recommended at Discharge Frequent or constant Supervision/Assistance  Patient can return home with the following A little help with walking and/or transfers;A little help with bathing/dressing/bathroom;Assistance with cooking/housework;Help with stairs or ramp for entrance;Assist for transportation   Equipment Recommendations  BSC/3in1    Recommendations for Other Services       Precautions / Restrictions Precautions Precautions: Fall Restrictions Weight Bearing Restrictions: No     Mobility  Bed Mobility Overal bed mobility: Needs Assistance Bed Mobility: Supine to Sit     Supine to sit: HOB elevated;Mod assist          Transfers Overall transfer level: Needs assistance Equipment used: Rolling walker (2 wheels) Transfers: Sit to/from Stand;Bed to  chair/wheelchair/BSC Sit to Stand: Min assist;From elevated surface     Step pivot transfers: Min assist          Ambulation/Gait Ambulation/Gait assistance: Min guard Gait Distance (Feet): 40 Feet Assistive device: Rolling walker (2 wheels) Gait Pattern/deviations: Step-through pattern Gait velocity: reduced Gait velocity interpretation: <1.8 ft/sec, indicate of risk for recurrent falls   General Gait Details: pt with slowed step-through gait   Stairs             Wheelchair Mobility    Modified Rankin (Stroke Patients Only)       Balance Overall balance assessment: Needs assistance Sitting-balance support: No upper extremity supported;Feet supported Sitting balance-Leahy Scale: Fair     Standing balance support: Bilateral upper extremity supported;Reliant on assistive device for balance Standing balance-Leahy Scale: Poor                              Cognition Arousal/Alertness: Awake/alert Behavior During Therapy: WFL for tasks assessed/performed Overall Cognitive Status: No family/caregiver present to determine baseline cognitive functioning                                 General Comments: pt follows commands well, slowed processing        Exercises      General Comments General comments (skin integrity, edema, etc.): VSS on RA      Pertinent Vitals/Pain Pain Assessment: 0-10 Pain Score: 7  Pain Location: low back Pain Descriptors / Indicators: Aching Pain Intervention(s): Monitored during session    Home Living  Prior Function            PT Goals (current goals can now be found in the care plan section) Acute Rehab PT Goals Patient Stated Goal: to feel better Progress towards PT goals: Progressing toward goals    Frequency    Min 2X/week      PT Plan Current plan remains appropriate    Co-evaluation              AM-PAC PT "6 Clicks" Mobility   Outcome  Measure  Help needed turning from your back to your side while in a flat bed without using bedrails?: A Little Help needed moving from lying on your back to sitting on the side of a flat bed without using bedrails?: A Lot Help needed moving to and from a bed to a chair (including a wheelchair)?: A Little Help needed standing up from a chair using your arms (e.g., wheelchair or bedside chair)?: A Little Help needed to walk in hospital room?: A Little Help needed climbing 3-5 steps with a railing? : Total 6 Click Score: 15    End of Session   Activity Tolerance: Patient tolerated treatment well Patient left: in chair;with call bell/phone within reach;with chair alarm set Nurse Communication: Mobility status PT Visit Diagnosis: Unsteadiness on feet (R26.81);Muscle weakness (generalized) (M62.81)     Time: [id number] PT Time Calculation (min) (ACUTE ONLY): 27 min  Charges:  $Gait Training: 8-22 mins $Therapeutic Activity: 8-22 mins                     Zenaida Niece, PT, DPT Acute Rehabilitation Pager: [phone] Office Donnelly Avery Eustice 05/07/2021, 12:31 PM

## 2021-05-07 NOTE — Care Management Important Message (Signed)
Important Message  Patient Details  Name: Norman Davis MRN: [id number] Date of Birth: Dec 19, 1940   Medicare Important Message Given:  Yes     Karlos Scadden Stefan Church 05/07/2021, 3:19 PM

## 2021-05-07 NOTE — Progress Notes (Signed)
OT Cancellation Note  Patient Details Name: Sharad Vaneaton MRN: [id number] DOB: Oct 30, 1940   Cancelled Treatment:    Reason Eval/Treat Not Completed: Fatigue/lethargy limiting ability to participate. Patient reports just returning to bed after PT treatment session. Declining all activity this afternoon. OT to check back as time allows.   Kallie Edward OTR/L Supplemental OT, Department of rehab services [phone]  Seung Nidiffer R H. 05/07/2021, 2:02 PM

## 2021-05-07 NOTE — Progress Notes (Signed)
[facility] PROGRESS NOTE   Hatch Pitzer [id number] DOB: 05-27-1940 DOA: 05/04/2021  PCP: [facility]  Brief History/Interval Summary: 81 y.o. 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 Patient has poorly controlled sciatica with chronic back pain shooting down to lateral side of the right leg.  Patient apparently had several falls as a result of this weakness.  Has had previous back surgery though not yet at home.   CT head raise concern for subacute infarct.  Patient was hospitalized for further management.  Reason for Visit: Right leg weakness with inability to ambulate  Consultants: None at this time  Procedures: None    Subjective/Interval History: Patient mentions that he is feeling better.  Back pain is better than before.  Able to move his right leg.  Denies any shortness of breath nausea vomiting     Assessment/Plan:  Sciatica with right lower extremity weakness with ambulatory dysfunction Patient has had inability to ambulate as a result of his back pain which has been radiating down his right leg.  He has had multiple falls.  Waiting on PT and OT evaluation. MRI of the thoracic and lumbar spine was ordered.  Shows spinal stenosis which is severe at L2-3 and L3-4.  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 No acute findings was noted.  Patient seen by neurosurgery.  They recommend steroids.  They feel that patient is poor surgical candidate and they do not feel that patient needs emergent decompression regardless.   Patient does not know why he is on Plavix.  Would be hesitant to stop Plavix at this time unless he needs urgent interventions. Continue with dexamethasone.  PT and OT  evaluation.  Continues to have reasonable strength in the lower right lower extremity.  He will need to go to skilled nursing facility for rehabilitation. CT head raised concern for a subacute infarct although this was on the right hemisphere.  MRI brain does not show any acute stroke.  No further work-up is necessary at this time.  Acute kidney injury/hypokalemia Presented with creatinine of 1.64.  Resolved with IV fluids.   Renal ultrasound did not show any acute findings. Potassium level has improved.  Right-sided pneumonia, community-acquired Respiratory status seems to be stable.  Currently on doxycycline.  Procalcitonin less than 0.1.  We will plan for a 5-day course of antibiotics.  Essential hypertension He remains on amlodipine, carvedilol, doxazosin.  Losartan was initially placed on hold due to elevated creatinine.  Resumed after renal function normalized.  Normocytic anemia Hemoglobin is stable.  No overt blood loss noted.  History of Paget's disease On bisphosphonate  History of COPD Stable.  DVT Prophylaxis: Subcutaneous heparin Code Status: Full code Family Communication: Sister was finally updated today. Disposition Plan: Skilled nursing facility recommended by PT and OT  Status is: Inpatient  Remains inpatient appropriate because: Inability to ambulate, unsafe discharge     Medications: Scheduled:  amLODipine  10 mg Oral Daily   atorvastatin  40 mg Oral q1800   carvedilol  3.125 mg Oral BID WC   clopidogrel  75 mg Oral Q breakfast   dexamethasone (DECADRON) injection  4 mg Intravenous Q12H   timolol  1 drop Both Eyes BID   And   dorzolamide  1 drop  Both Eyes BID   doxazosin  8 mg Oral QHS   doxycycline  100 mg Oral Q12H   finasteride  5 mg Oral Daily   FLUoxetine  20 mg Oral Daily   folic acid  1 mg Oral Daily   heparin  5,000 Units Subcutaneous Q12H   losartan  100 mg Oral QHS   melatonin  3 mg Oral QHS   mirabegron ER  25 mg Oral Daily    mirtazapine  30 mg Oral QHS   polyethylene glycol  17 g Oral Daily   pregabalin  25 mg Oral QHS   senna  1 tablet Oral Daily   Continuous:   [id number] **OR** acetaminophen, albuterol, bisacodyl, fluticasone, ondansetron **OR** ondansetron (ZOFRAN) IV, oxyCODONE  Antibiotics: Anti-infectives (From admission, onward)    Start     Dose/Rate Route Frequency Ordered Stop   05/05/21 2200  doxycycline (VIBRA-TABS) tablet 100 mg        100 mg Oral Every 12 hours 05/04/21 1936     05/05/21 1500  cefTRIAXone (ROCEPHIN) 1 g in sodium chloride 0.9 % 100 mL IVPB  Status:  Discontinued        1 g 200 mL/hr over 30 Minutes Intravenous Every 24 hours 05/04/21 1829 05/04/21 1936   05/04/21 1530  cefTRIAXone (ROCEPHIN) 1 g in sodium chloride 0.9 % 100 mL IVPB        1 g 200 mL/hr over 30 Minutes Intravenous  Once 05/04/21 1529 05/04/21 2326   05/04/21 1530  doxycycline (VIBRA-TABS) tablet 100 mg        100 mg Oral  Once 05/04/21 1529 05/04/21 2155       Objective:  Vital Signs  Vitals:   05/06/21 2111 05/07/21 0304 05/07/21 0500 05/07/21 0842  BP: (!) 145/100  (!) 128/96 (!) 146/98  Pulse: 78  82 84  Resp: 17  19 19   Temp: 98.1 F (36.7 C)  98.3 F (36.8 C) 98.2 F (36.8 C)  TempSrc:    Oral  SpO2: 94%  91% 97%  Weight:  78.7 kg    Height:        Intake/Output Summary (Last 24 hours) at 05/07/2021 1026 Last data filed at 05/07/2021 0856 Gross per 24 hour  Intake 1440 ml  Output 1050 ml  Net 390 ml    Filed Weights   05/04/21 1148 05/07/21 0304  Weight: 72.6 kg 78.7 kg    General appearance: Awake alert.  In no distress Resp: Clear to auscultation bilaterally.  Normal effort Cardio: S1-S2 is normal regular.  No S3-S4.  No rubs murmurs or bruit GI: Abdomen is soft.  Nontender nondistended.  Bowel sounds are present normal.  No masses organomegaly Extremities: No edema.  Moving his right lower extremity Neurologic:  No focal neurological deficits.      Lab  Results:  Data Reviewed: I have personally reviewed following labs and imaging studies  CBC: Recent Labs  Lab 05/04/21 1318 05/05/21 0220 05/06/21 0315  WBC 8.5 7.5 5.3  NEUTROABS 6.9  --   --   HGB 12.1* 11.5* 11.2*  HCT 37.6* 36.0* 34.1*  MCV 87.9 87.2 85.9  PLT 170 147* 111*     Basic Metabolic Panel: Recent Labs  Lab 05/04/21 1318 05/05/21 0220 05/06/21 0315  NA 143 139 135  K 3.6 3.2* 4.3  CL 108 107 107  CO2 26 23 22   GLUCOSE 97 118* 129*  BUN 18 14 16   CREATININE 1.64* 1.00 1.12  CALCIUM 8.9  8.6* 8.9  MG 1.7  --   --      GFR: Estimated Creatinine Clearance: 50.9 mL/min (by C-G formula based on SCr of 1.12 mg/dL).  Liver Function Tests: Recent Labs  Lab 05/04/21 1318  AST 15  ALT 10  ALKPHOS 180*  BILITOT 1.0  PROT 7.2  ALBUMIN 3.7      Cardiac Enzymes: Recent Labs  Lab 05/04/21 1318  CKTOTAL 104      Recent Results (from the past 240 hour(s))  Resp Panel by RT-PCR (Flu A&B, Covid) Nasopharyngeal Swab     Status: None   Collection Time: 05/05/21  7:45 AM   Specimen: Nasopharyngeal Swab; Nasopharyngeal(NP) swabs in vial transport medium  Result Value Ref Range Status   SARS Coronavirus 2 by RT PCR NEGATIVE NEGATIVE Final    Comment: (NOTE) SARS-CoV-2 target nucleic acids are NOT DETECTED.  The SARS-CoV-2 RNA is generally detectable in upper respiratory specimens during the acute phase of infection. The lowest concentration of SARS-CoV-2 viral copies this assay can detect is 138 copies/mL. A negative result does not preclude SARS-Cov-2 infection and should not be used as the sole basis for treatment or other patient management decisions. A negative result may occur with  improper specimen collection/handling, submission of specimen other than nasopharyngeal swab, presence of viral mutation(s) within the areas targeted by this assay, and inadequate number of viral copies(<138 copies/mL). A negative result must be combined  with clinical observations, patient history, and epidemiological information. The expected result is Negative.  Fact Sheet for Patients:  EntrepreneurPulse.com.au  Fact Sheet for Healthcare Providers:  IncredibleEmployment.be  This test is no t yet approved or cleared by the [place] FDA and  has been authorized for detection and/or diagnosis of SARS-CoV-2 by FDA under an Emergency Use Authorization (EUA). This EUA will remain  in effect (meaning this test can be used) for the duration of the COVID-19 declaration under Section 564(b)(1) of the Act, 21 U.S.C.section 360bbb-3(b)(1), unless the authorization is terminated  or revoked sooner.       Influenza A by PCR NEGATIVE NEGATIVE Final   Influenza B by PCR NEGATIVE NEGATIVE Final    Comment: (NOTE) The Xpert Xpress SARS-CoV-2/FLU/RSV plus assay is intended as an aid in the diagnosis of influenza from Nasopharyngeal swab specimens and should not be used as a sole basis for treatment. Nasal washings and aspirates are unacceptable for Xpert Xpress SARS-CoV-2/FLU/RSV testing.  Fact Sheet for Patients: EntrepreneurPulse.com.au  Fact Sheet for Healthcare Providers: IncredibleEmployment.be  This test is not yet approved or cleared by the [place] FDA and has been authorized for detection and/or diagnosis of SARS-CoV-2 by FDA under an Emergency Use Authorization (EUA). This EUA will remain in effect (meaning this test can be used) for the duration of the COVID-19 declaration under Section 564(b)(1) of the Act, 21 U.S.C. section 360bbb-3(b)(1), unless the authorization is terminated or revoked.  Performed at [facility], [place] [address], [place], [place] [address]        Radiology Studies: ECHOCARDIOGRAM COMPLETE  Result Date: 05/05/2021    ECHOCARDIOGRAM REPORT   Patient Name:   Kirt Oshana Date of Exam: 05/05/2021 Medical Rec #:   [id number]   Height:       69.0 in Accession #:    [id number]  Weight:       160.0 lb Date of Birth:  12-31-1940   BSA:          1.879 m Patient Age:  80 years    BP:           132/89 mmHg Patient Gender: M           HR:           90 bpm. Exam Location:  Inpatient Procedure: 2D Echo, Cardiac Doppler and Color Doppler Indications:    stroke  History:        Patient has prior history of Echocardiogram examinations, most                 recent 04/13/2017. Signs/Symptoms:Shortness of Breath; Risk                 Factors:Hypertension and Dyslipidemia. Gerds.  Sonographer:    Beryle Beams Referring Phys: [id number] [place]  1. Left ventricular ejection fraction, by estimation, is 50 to 55%. The left ventricle has low normal function. The left ventricle has no regional wall motion abnormalities. There is mild asymmetric left ventricular hypertrophy of the basal-septal segment. Left ventricular diastolic parameters are consistent with Grade I diastolic dysfunction (impaired relaxation).  2. Right ventricular systolic function is normal. The right ventricular size is normal. Tricuspid regurgitation signal is inadequate for assessing PA pressure.  3. The mitral valve is degenerative. Trivial mitral valve regurgitation. No evidence of mitral stenosis.  4. The aortic valve was not well visualized. Aortic valve regurgitation is not visualized. Aortic valve sclerosis is present, with no evidence of aortic valve stenosis.  5. Aortic dilatation noted. There is mild dilatation of the aortic root, measuring 38 mm. Comparison(s): Compared to prior TTE in 2018, there is no significant change. Conclusion(s)/Recommendation(s): No intracardiac source of embolism detected on this transthoracic study. Consider a transesophageal echocardiogram to exclude cardiac source of embolism if clinically indicated. FINDINGS  Left Ventricle: Left ventricular ejection fraction, by estimation, is 50 to 55%. The left ventricle has low  normal function. The left ventricle has no regional wall motion abnormalities. The left ventricular internal cavity size was normal in size. There is mild asymmetric left ventricular hypertrophy of the basal-septal segment. Left ventricular diastolic parameters are consistent with Grade I diastolic dysfunction (impaired relaxation). Right Ventricle: The right ventricular size is normal. Right vetricular wall thickness was not well visualized. Right ventricular systolic function is normal. Tricuspid regurgitation signal is inadequate for assessing PA pressure. Left Atrium: Left atrial size was normal in size. Right Atrium: Right atrial size was normal in size. Pericardium: There is no evidence of pericardial effusion. Mitral Valve: The mitral valve is degenerative in appearance. There is mild thickening of the mitral valve leaflet(s). There is mild calcification of the mitral valve leaflet(s). Mild to moderate mitral annular calcification. Trivial mitral valve regurgitation. No evidence of mitral valve stenosis. Tricuspid Valve: The tricuspid valve is normal in structure. Tricuspid valve regurgitation is trivial. Aortic Valve: The aortic valve was not well visualized. Aortic valve regurgitation is not visualized. Aortic valve sclerosis is present, with no evidence of aortic valve stenosis. Aortic valve mean gradient measures 3.0 mmHg. Aortic valve peak gradient measures 5.4 mmHg. Aortic valve area, by VTI measures 2.13 cm. Pulmonic Valve: The pulmonic valve was not well visualized. Aorta: Aortic dilatation noted. There is mild dilatation of the aortic root, measuring 38 mm. Venous: The inferior vena cava was not well visualized. IAS/Shunts: The atrial septum is grossly normal.  LEFT VENTRICLE PLAX 2D LVIDd:         3.51 cm     Diastology LVIDs:  2.58 cm     LV e' medial:    6.53 cm/s LV PW:         1.12 cm     LV E/e' medial:  10.0 LV IVS:        0.89 cm     LV e' lateral:   9.57 cm/s LVOT diam:     2.20 cm      LV E/e' lateral: 6.8 LV SV:         49 LV SV Index:   26 LVOT Area:     3.80 cm  LV Volumes (MOD) LV vol d, MOD A2C: 92.6 ml LV vol s, MOD A2C: 39.6 ml LV vol s, MOD A4C: 35.5 ml LV SV MOD A2C:     53.0 ml RIGHT VENTRICLE RV S prime:     9.68 cm/s TAPSE (M-mode): 1.9 cm LEFT ATRIUM           Index        RIGHT ATRIUM           Index LA diam:      2.90 cm 1.54 cm/m   RA Area:     11.80 cm 6.28 cm/m LA Vol (A2C): 43.6 ml 23.20 ml/m LA Vol (A4C): 80.8 ml 43.00 ml/m  AORTIC VALVE                    PULMONIC VALVE AV Area (Vmax):    2.78 cm     RVOT Peak grad: 1 mmHg AV Area (Vmean):   2.65 cm AV Area (VTI):     2.13 cm AV Vmax:           116.00 cm/s AV Vmean:          77.100 cm/s AV VTI:            0.232 m AV Peak Grad:      5.4 mmHg AV Mean Grad:      3.0 mmHg LVOT Vmax:         84.90 cm/s LVOT Vmean:        53.800 cm/s LVOT VTI:          0.130 m LVOT/AV VTI ratio: 0.56  AORTA Ao Root diam: 3.70 cm MITRAL VALVE MV Area (PHT): 5.27 cm    SHUNTS MV Decel Time: 144 msec    Systemic VTI:  0.13 m MV E velocity: 65.10 cm/s  Systemic Diam: 2.20 cm MV A velocity: 49.70 cm/s  Pulmonic VTI:  0.065 m MV E/A ratio:  1.31 Gwyndolyn Kaufman MD Electronically signed by Gwyndolyn Kaufman MD Signature Date/Time: 05/05/2021/1:03:34 PM    Final    VAS US CAROTID  Result Date: 05/05/2021 Carotid Arterial Duplex Study Patient Name:  Beaumont Laurich  Date of Exam:   05/05/2021 Medical Rec #: [id number]    Accession #:    [id number] Date of Birth: Sep 19, 1940    Patient Gender: M Patient Age:   60 years Exam Location:  [facility] Procedure:      VAS US CAROTID Referring Phys: Wynetta Fines --------------------------------------------------------------------------------  Indications:       CVA. Risk Factors:      Hypertension, hyperlipidemia. Comparison Study:  05/24/2017 - Right Carotid: Velocities in the right ICA are                    consistent with a 1-39% stenosis.  Left Carotid: Velocities in the left ICA  are consistent with                    a 1-39%                    stenosis.                     Vertebrals: Both vertebral arteries were patent with                    antegrade flow.                    Subclavians: Normal flow hemodynamics were seen in bilateral                    subclavian arteries. Performing Technologist: Chanda Busing RVT  Examination Guidelines: A complete evaluation includes B-mode imaging, spectral Doppler, color Doppler, and power Doppler as needed of all accessible portions of each vessel. Bilateral testing is considered an integral part of a complete examination. Limited examinations for reoccurring indications may be performed as noted.  Right Carotid Findings: +----------+--------+--------+--------+-----------------------+--------+             PSV cm/s EDV cm/s Stenosis Plaque Description      Comments  +----------+--------+--------+--------+-----------------------+--------+  CCA Prox   88       28                smooth and heterogenous           +----------+--------+--------+--------+-----------------------+--------+  CCA Distal 55       16                smooth and heterogenous           +----------+--------+--------+--------+-----------------------+--------+  ICA Prox   37       16                smooth and heterogenous           +----------+--------+--------+--------+-----------------------+--------+  ICA Distal 51       18                                        tortuous  +----------+--------+--------+--------+-----------------------+--------+  ECA        30       5                                                   +----------+--------+--------+--------+-----------------------+--------+ +----------+--------+-------+--------+-------------------+             PSV cm/s EDV cms Describe Arm Pressure (mmHG)  +----------+--------+-------+--------+-------------------+  Subclavian 40                                             +----------+--------+-------+--------+-------------------+  +---------+--------+--+--------+--+---------+  Vertebral PSV cm/s 29 EDV cm/s 11 Antegrade  +---------+--------+--+--------+--+---------+  Left Carotid Findings: +----------+--------+--------+--------+-----------------------+--------+             PSV cm/s EDV cm/s Stenosis Plaque Description      Comments  +----------+--------+--------+--------+-----------------------+--------+  CCA Prox   81  22                smooth and heterogenous           +----------+--------+--------+--------+-----------------------+--------+  CCA Distal 52       19                smooth and heterogenous           +----------+--------+--------+--------+-----------------------+--------+  ICA Prox   45       11                smooth and heterogenous tortuous  +----------+--------+--------+--------+-----------------------+--------+  ICA Distal 51       19                                        tortuous  +----------+--------+--------+--------+-----------------------+--------+  ECA        41       8                                                   +----------+--------+--------+--------+-----------------------+--------+ +----------+--------+--------+--------+-------------------+             PSV cm/s EDV cm/s Describe Arm Pressure (mmHG)  +----------+--------+--------+--------+-------------------+  Subclavian 71                                              +----------+--------+--------+--------+-------------------+ +---------+--------+--+--------+-+---------+  Vertebral PSV cm/s 33 EDV cm/s 8 Antegrade  +---------+--------+--+--------+-+---------+   Summary: Right Carotid: Velocities in the right ICA are consistent with a 1-39% stenosis. Left Carotid: Velocities in the left ICA are consistent with a 1-39% stenosis. Vertebrals: Bilateral vertebral arteries demonstrate antegrade flow. *See table(s) above for measurements and observations.     Preliminary        LOS: 3 days   Chardonay [place] [employer] on  [email or web address]  05/07/2021, 10:26 AM

## 2021-05-08 MED ORDER — ACETAMINOPHEN 325 MG PO TABS
650.0000 mg | ORAL_TABLET | Freq: Three times a day (TID) | ORAL | Status: DC
  Administered 2021-05-08 – 2021-05-10 (×6): 650 mg via ORAL
  Filled 2021-05-08 (×6): qty 2

## 2021-05-08 MED ORDER — METHOCARBAMOL 500 MG PO TABS: 500.0000 mg | ORAL_TABLET | Freq: Three times a day (TID) | ORAL | Status: DC | PRN

## 2021-05-08 MED ORDER — METHOCARBAMOL 500 MG PO TABS
500.0000 mg | ORAL_TABLET | Freq: Three times a day (TID) | ORAL | Status: AC
  Administered 2021-05-08 – 2021-05-09 (×5): 500 mg via ORAL
  Filled 2021-05-08 (×6): qty 1

## 2021-05-08 NOTE — Progress Notes (Addendum)
Occupational Therapy Treatment Patient Details Name: Norman Davis MRN: [id number] DOB: 03-Apr-1941 Today's Date: 05/08/2021   History of present illness Pt is an 81 y/o male admitted secondary to weakness and falls. MRI negative. PMH includes COPD, schizophrenia, Paget's disease and HTN.   OT comments  Pt. Seen for skilled OT treatment session.  Bed mobility with S.  Min a sit/stand and during transfer to recliner.  Posterior LOB x1 when taking backward steps to recliner.  Required physical assistance for correction and safe descend to chair.  Current d/c recommendations remain appropriate.     Recommendations for follow up therapy are one component of a multi-disciplinary discharge planning process, led by the attending physician.  Recommendations may be updated based on patient status, additional functional criteria and insurance authorization.    Follow Up Recommendations  Skilled nursing-short term rehab (<3 hours/day)    Assistance Recommended at Discharge Frequent or constant Supervision/Assistance  Patient can return home with the following  A lot of help with walking and/or transfers;A lot of help with bathing/dressing/bathroom;Assistance with cooking/housework;Direct supervision/assist for medications management;Assist for transportation;Help with stairs or ramp for entrance   Equipment Recommendations  BSC/3in1    Recommendations for Other Services      Precautions / Restrictions Precautions Precautions: Fall Restrictions Weight Bearing Restrictions: No       Mobility Bed Mobility Overal bed mobility: Needs Assistance Bed Mobility: Rolling;Sidelying to Sit Rolling: Supervision Sidelying to sit: Supervision       General bed mobility comments: no physical assistance required, cues for sequencing but good tech. demonstrated    Transfers Overall transfer level: Needs assistance Equipment used: Rolling walker (2 wheels) Transfers: Sit to/from Stand;Bed to  chair/wheelchair/BSC Sit to Stand: Min assist;From elevated surface   Step pivot transfers: Min assist       General transfer comment: post. LOB x1 with backward steps to recliner no initiation of correction when LOB occured, therapist asst. provided physical assistance for correction.  Fall prevention handouts provided for pt. To review.      Balance                                           ADL either performed or assessed with clinical judgement   ADL Overall ADL's : Needs assistance/impaired                         Toilet Transfer: Min guard;Ambulation;Rolling walker (2 wheels) Toilet Transfer Details (indicate cue type and reason): observed during transfer from eob to recliner approx. 3-4 steps         Functional mobility during ADLs: Min guard;Minimal assistance General ADL Comments: posterior LOB noted when taking steps backwards to recliner, required therapist intervention for correction.  Provided fall prevention handouts for pt. To review.      Extremity/Trunk Assessment              Vision       Perception     Praxis      Cognition Arousal/Alertness: Awake/alert Behavior During Therapy: WFL for tasks assessed/performed Overall Cognitive Status: No family/caregiver present to determine baseline cognitive functioning                                 General Comments: pt follows commands well, slowed processing  Exercises     Shoulder Instructions       General Comments      Pertinent Vitals/ Pain       Pain Assessment: Faces Faces Pain Scale: Hurts a little bit Pain Location: stomach Pain Descriptors / Indicators: Other (Comment) (gas)  Home Living                                          Prior Functioning/Environment              Frequency  Min 2X/week        Progress Toward Goals  OT Goals(current goals can now be found in the care plan section)  Progress  towards OT goals: Progressing toward goals     Plan Discharge plan remains appropriate    Co-evaluation                 AM-PAC OT "6 Clicks" Daily Activity     Outcome Measure   Help from another person eating meals?: None Help from another person taking care of personal grooming?: A Little Help from another person toileting, which includes using toliet, bedpan, or urinal?: A Lot Help from another person bathing (including washing, rinsing, drying)?: A Lot Help from another person to put on and taking off regular upper body clothing?: A Little Help from another person to put on and taking off regular lower body clothing?: A Lot 6 Click Score: 16    End of Session Equipment Utilized During Treatment: Gait belt;Rolling walker (2 wheels)  OT Visit Diagnosis: Unsteadiness on feet (R26.81);Other abnormalities of gait and mobility (R26.89);Muscle weakness (generalized) (M62.81);History of falling (Z91.81);Pain   Activity Tolerance Patient tolerated treatment well   Patient Left in chair;with call bell/phone within reach;with chair alarm set   Nurse Communication Other (comment);Mobility status (spoke with cna/rn pt. up in chair with chair alarm set, reviewed mobility status for transfer and bed mobility. pt. requesting ice water received permission to provide for him)        Time: 4010-2725 OT Time Calculation (min): 23 min  Charges: OT General Charges $OT Visit: 1 Visit OT Treatments $Self Care/Home Management : 23-37 mins  Boneta Lucks, COTA/L Acute Rehabilitation [phone]   Salvadore Oxford 05/08/2021, 10:12 AM

## 2021-05-08 NOTE — TOC Progression Note (Signed)
Transition of Care [facility]) - Progression Note    Patient Details  Name: Norman Davis MRN: [id number] Date of Birth: October 16, 1940  [facility] CM/SW Contact  Carley Hammed, [place] Phone Number: 05/08/2021, 4:32 PM  Clinical Narrative:    CSW provided bed offers to pt at bedside. He stated he would review them and speak with family. CSW will follow up with pt for bed choice.   Expected Discharge Plan: Skilled Nursing Facility Barriers to Discharge: SNF Pending bed offer, Continued Medical Work up, English as a second language teacher  Expected Discharge Plan and Services Expected Discharge Plan: Skilled Nursing Facility In-house Referral: Clinical Social Work   Post Acute Care Choice: Skilled Nursing Facility Living arrangements for the past 2 months: Single Family Home                                       Social Determinants of Health (SDOH) Interventions    Readmission Risk Interventions No flowsheet data found.

## 2021-05-08 NOTE — Plan of Care (Signed)
  Problem: Activity: Goal: Risk for activity intolerance will decrease Outcome: Progressing   Problem: Elimination: Goal: Will not experience complications related to bowel motility Outcome: Progressing   

## 2021-05-08 NOTE — Progress Notes (Signed)
[facility] PROGRESS NOTE   Norman Davis FYB:[id number] DOB: Dec 08, 1940 DOA: 05/04/2021  PCP: [facility]  Brief History/Interval Summary: 81 y.o. 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 Patient has poorly controlled sciatica with chronic back pain shooting down to lateral side of the right leg.  Patient apparently had several falls as a result of this weakness.  Has had previous back surgery though not yet at home.   CT head raise concern for subacute infarct.  Patient was hospitalized for further management.  Reason for Visit: Right leg weakness with inability to ambulate  Consultants: None at this time  Procedures: None    Subjective/Interval History: Patient continues to have back pain.  Able to move his lower extremities.  Denies any new complaints at this time.     Assessment/Plan:  Sciatica with right lower extremity weakness with ambulatory dysfunction Patient has had inability to ambulate as a result of his back pain which has been radiating down his right leg.  He has had multiple falls.  Waiting on PT and OT evaluation. MRI of the thoracic and lumbar spine was ordered.  Shows spinal stenosis which is severe at L2-3 and L3-4.  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 No acute findings was noted.  Patient seen by neurosurgery.  They recommend steroids.  They feel that patient is poor surgical candidate and they do not feel that patient needs emergent decompression regardless.   Patient does not know why he is on Plavix.  Would be hesitant to stop Plavix at this time unless he needs urgent interventions. Continue with dexamethasone.  PT and OT evaluation.  Continues to have reasonable  strength in the lower right lower extremity.  He will need to go to skilled nursing facility for rehabilitation. CT head raised concern for a subacute infarct although this was on the right hemisphere.  MRI brain does not show any acute stroke.  No further work-up is necessary at this time. Neurological status seems to be stable.  Continues to have pain issues.  We will give him as scheduled Tylenol.  He is getting Oxy IR.  We will also give him muscle relaxants.  Acute kidney injury/hypokalemia Presented with creatinine of 1.64.  Resolved with IV fluids.   Renal ultrasound did not show any acute findings. Potassium level has improved.  Right-sided pneumonia, community-acquired Respiratory status seems to be stable.  Currently on doxycycline.  Procalcitonin less than 0.1.  We will plan for a 5-day course of antibiotics.  Essential hypertension He remains on amlodipine, carvedilol, doxazosin.  Losartan was initially placed on hold due to elevated creatinine.  Resumed after renal function normalized.  Normocytic anemia Hemoglobin is stable.  No overt blood loss noted.  Recheck labs tomorrow.  History of Paget's disease On bisphosphonate  History of COPD Stable.  DVT Prophylaxis: Subcutaneous heparin Code Status: Full code Family Communication: Sister was updated yesterday. Disposition Plan: Skilled nursing facility recommended by PT and OT  Status is: Inpatient  Remains inpatient appropriate because: Inability to ambulate, unsafe discharge     Medications: Scheduled:  amLODipine  10 mg Oral Daily   atorvastatin  40 mg Oral q1800   carvedilol  3.125 mg Oral BID WC   clopidogrel  75 mg Oral Q  breakfast   dexamethasone (DECADRON) injection  4 mg Intravenous Q12H   timolol  1 drop Both Eyes BID   And   dorzolamide  1 drop Both Eyes BID   doxazosin  8 mg Oral QHS   doxycycline  100 mg Oral Q12H   finasteride  5 mg Oral Daily   FLUoxetine  20 mg Oral Daily   folic acid  1 mg  Oral Daily   heparin  5,000 Units Subcutaneous Q12H   losartan  100 mg Oral QHS   melatonin  3 mg Oral QHS   mirabegron ER  25 mg Oral Daily   mirtazapine  30 mg Oral QHS   polyethylene glycol  17 g Oral BID   pregabalin  25 mg Oral QHS   senna-docusate  2 tablet Oral BID   Continuous:   FAO:ZHYQMVHQIONGEPRN:acetaminophen **OR** acetaminophen, albuterol, bisacodyl, fluticasone, ondansetron **OR** ondansetron (ZOFRAN) IV, oxyCODONE  Antibiotics: Anti-infectives (From admission, onward)    Start     Dose/Rate Route Frequency Ordered Stop   05/05/21 2200  doxycycline (VIBRA-TABS) tablet 100 mg        100 mg Oral Every 12 hours 05/04/21 1936 05/10/21 2159   05/05/21 1500  cefTRIAXone (ROCEPHIN) 1 g in sodium chloride 0.9 % 100 mL IVPB  Status:  Discontinued        1 g 200 mL/hr over 30 Minutes Intravenous Every 24 hours 05/04/21 1829 05/04/21 1936   05/04/21 1530  cefTRIAXone (ROCEPHIN) 1 g in sodium chloride 0.9 % 100 mL IVPB        1 g 200 mL/hr over 30 Minutes Intravenous  Once 05/04/21 1529 05/04/21 2326   05/04/21 1530  doxycycline (VIBRA-TABS) tablet 100 mg        100 mg Oral  Once 05/04/21 1529 05/04/21 2155       Objective:  Vital Signs  Vitals:   05/07/21 2014 05/07/21 2339 05/08/21 0310 05/08/21 0901  BP: (!) 146/106 (!) 133/94 (!) 149/105 136/89  Pulse: 79 71 66 71  Resp: 18 18 18 18   Temp: 98.7 F (37.1 C) 98 F (36.7 C) 97.7 F (36.5 C) 97.9 F (36.6 C)  TempSrc: Oral Oral Oral Oral  SpO2: 98% 96% 98% 100%  Weight:      Height:        Intake/Output Summary (Last 24 hours) at 05/08/2021 0951 Last data filed at 05/08/2021 0900 Gross per 24 hour  Intake 960 ml  Output 1675 ml  Net -715 ml    Filed Weights   05/04/21 1148 05/07/21 0304  Weight: 72.6 kg 78.7 kg    General appearance: Awake alert.  In no distress Resp: Clear to auscultation bilaterally.  Normal effort Cardio: S1-S2 is normal regular.  No S3-S4.  No rubs murmurs or bruit GI: Abdomen is soft.   Nontender nondistended.  Bowel sounds are present normal.  No masses organomegaly Extremities: No edema.  Neurologic: Alert and oriented x3.  Moving both of his lower extremities.  Left better than right.     Lab Results:  Data Reviewed: I have personally reviewed following labs and imaging studies  CBC: Recent Labs  Lab 05/04/21 1318 05/05/21 0220 05/06/21 0315  WBC 8.5 7.5 5.3  NEUTROABS 6.9  --   --   HGB 12.1* 11.5* 11.2*  HCT 37.6* 36.0* 34.1*  MCV 87.9 87.2 85.9  PLT 170 147* 111*     Basic Metabolic Panel: Recent Labs  Lab 05/04/21 1318 05/05/21 0220 05/06/21 0315  NA  143 139 135  K 3.6 3.2* 4.3  CL 108 107 107  CO2 26 23 22   GLUCOSE 97 118* 129*  BUN 18 14 16   CREATININE 1.64* 1.00 1.12  CALCIUM 8.9 8.6* 8.9  MG 1.7  --   --      GFR: Estimated Creatinine Clearance: 50.9 mL/min (by C-G formula based on SCr of 1.12 mg/dL).  Liver Function Tests: Recent Labs  Lab 05/04/21 1318  AST 15  ALT 10  ALKPHOS 180*  BILITOT 1.0  PROT 7.2  ALBUMIN 3.7      Cardiac Enzymes: Recent Labs  Lab 05/04/21 1318  CKTOTAL 104      Recent Results (from the past 240 hour(s))  Resp Panel by RT-PCR (Flu A&B, Covid) Nasopharyngeal Swab     Status: None   Collection Time: 05/05/21  7:45 AM   Specimen: Nasopharyngeal Swab; Nasopharyngeal(NP) swabs in vial transport medium  Result Value Ref Range Status   SARS Coronavirus 2 by RT PCR NEGATIVE NEGATIVE Final    Comment: (NOTE) SARS-CoV-2 target nucleic acids are NOT DETECTED.  The SARS-CoV-2 RNA is generally detectable in upper respiratory specimens during the acute phase of infection. The lowest concentration of SARS-CoV-2 viral copies this assay can detect is 138 copies/mL. A negative result does not preclude SARS-Cov-2 infection and should not be used as the sole basis for treatment or other patient management decisions. A negative result may occur with  improper specimen collection/handling,  submission of specimen other than nasopharyngeal swab, presence of viral mutation(s) within the areas targeted by this assay, and inadequate number of viral copies(<138 copies/mL). A negative result must be combined with clinical observations, patient history, and epidemiological information. The expected result is Negative.  Fact Sheet for Patients:  07/02/21  Fact Sheet for Healthcare Providers:  07/03/21  This test is no t yet approved or cleared by the [employer] FDA and  has been authorized for detection and/or diagnosis of SARS-CoV-2 by FDA under an Emergency Use Authorization (EUA). This EUA will remain  in effect (meaning this test can be used) for the duration of the COVID-19 declaration under Section 564(b)(1) of the Act, 21 U.S.C.section 360bbb-3(b)(1), unless the authorization is terminated  or revoked sooner.       Influenza A by PCR NEGATIVE NEGATIVE Final   Influenza B by PCR NEGATIVE NEGATIVE Final    Comment: (NOTE) The Xpert Xpress SARS-CoV-2/FLU/RSV plus assay is intended as an aid in the diagnosis of influenza from Nasopharyngeal swab specimens and should not be used as a sole basis for treatment. Nasal washings and aspirates are unacceptable for Xpert Xpress SARS-CoV-2/FLU/RSV testing.  Fact Sheet for Patients: [employer]it  Fact Sheet for Healthcare Providers: [place]  This test is not yet approved or cleared by the [employer] FDA and has been authorized for detection and/or diagnosis of SARS-CoV-2 by FDA under an Emergency Use Authorization (EUA). This EUA will remain in effect (meaning this test can be used) for the duration of the COVID-19 declaration under Section 564(b)(1) of the Act, 21 U.S.C. section 360bbb-3(b)(1), unless the authorization is terminated or revoked.  Performed at [facility], [address], [place], [address] [place]        Radiology Studies: No results found.     LOS: 4 days   Cecille Mcclusky [place] on [email or web address]  05/08/2021, 9:51 AM

## 2021-05-09 LAB — COMPREHENSIVE METABOLIC PANEL
ALT: 16 U/L (ref 0–44)
AST: 19 U/L (ref 15–41)
Albumin: 2.6 g/dL — ABNORMAL LOW (ref 3.5–5.0)
Alkaline Phosphatase: 141 U/L — ABNORMAL HIGH (ref 38–126)
Anion gap: 6 (ref 5–15)
BUN: 25 mg/dL — ABNORMAL HIGH (ref 8–23)
CO2: 23 mmol/L (ref 22–32)
Calcium: 8.1 mg/dL — ABNORMAL LOW (ref 8.9–10.3)
Chloride: 106 mmol/L (ref 98–111)
Creatinine, Ser: 1.18 mg/dL (ref 0.61–1.24)
GFR, Estimated: >60 mL/min (ref >60)
Glucose, Bld: 120 mg/dL — ABNORMAL HIGH (ref 70–99)
Potassium: 4.1 mmol/L (ref 3.5–5.1)
Sodium: 135 mmol/L (ref 135–145)
Total Bilirubin: 0.6 mg/dL (ref 0.3–1.2)
Total Protein: 5.8 g/dL — ABNORMAL LOW (ref 6.5–8.1)

## 2021-05-09 LAB — CBC
HCT: 33.4 % — ABNORMAL LOW (ref 39.0–52.0)
Hemoglobin: 10.6 g/dL — ABNORMAL LOW (ref 13.0–17.0)
MCH: 27.3 pg (ref 26.0–34.0)
MCHC: 31.7 g/dL (ref 30.0–36.0)
MCV: 86.1 fL (ref 80.0–100.0)
Platelets: 191 10*3/uL (ref 150–400)
RBC: 3.88 MIL/uL — ABNORMAL LOW (ref 4.22–5.81)
RDW: 15.1 % (ref 11.5–15.5)
WBC: 7.2 10*3/uL (ref 4.0–10.5)
nRBC: 0 % (ref 0.0–0.2)

## 2021-05-09 MED ORDER — PANTOPRAZOLE SODIUM 40 MG PO TBEC
40.0000 mg | DELAYED_RELEASE_TABLET | Freq: Every day | ORAL | Status: DC
  Administered 2021-05-09 – 2021-05-10 (×2): 40 mg via ORAL
  Filled 2021-05-09 (×2): qty 1

## 2021-05-09 NOTE — TOC Progression Note (Signed)
Transition of Care [facility]) - Progression Note    Patient Details  Name: Norman Davis MRN: [id number] Date of Birth: 1940/06/26  [facility]) CM/SW [place], [place] Phone Number: 05/09/2021, 2:59 PM  Clinical Narrative:    CSW discussed bed offers with pt and he requested CSW call his sister to discuss. CSW called Katy Apo who stated that her preference and her brothers would be for him to return home. CSW and sister went over the PT note and the needs pt has and she stated she already assists him with much of that. They have had many bad experiences with SNF's and they want to avoid them if able. She noted pt is active with the [facility] and gets [facility] and aides through them. They also have all the equipment they will need. Katy Apo noted she will just need transportation to get him home.  TOC to contact Barbie Haggis, SW at [place] [address] to confirm [facility] and other supports at home.  TOC will continue to follow for DC needs.      Expected Discharge Plan: [place] Barriers to Discharge: SNF Pending bed offer, Continued Medical Work up, Ship broker  Expected Discharge Plan and Services Expected Discharge Plan: [place] In-house Referral: Clinical Social Work   Post Acute Care Choice: [place] Living arrangements for the past 2 months: Single Family Home                                       Social Determinants of Health (SDOH) Interventions    Readmission Risk Interventions No flowsheet data found.

## 2021-05-09 NOTE — Progress Notes (Signed)
[facility] PROGRESS NOTE   Norman Davis IRC:[id number] DOB: 1940/10/07 DOA: 05/04/2021  PCP: [facility]  Brief History/Interval Summary: 81 y.o. 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 Patient has poorly controlled sciatica with chronic back pain shooting down to lateral side of the right leg.  Patient apparently had several falls as a result of this weakness.  Has had previous back surgery though not yet at home.   CT head raise concern for subacute infarct.  Patient was hospitalized for further management.  Reason for Visit: Right leg weakness with inability to ambulate  Consultants: None at this time  Procedures: None    Subjective/Interval History: Continues to complain of back pain.  However is able to move his legs better than before.  Also mentions some nausea this morning.  No vomiting.     Assessment/Plan:  Sciatica with right lower extremity weakness with ambulatory dysfunction Patient has had inability to ambulate as a result of his back pain which has been radiating down his right leg.  He has had multiple falls.  Waiting on PT and OT evaluation. MRI of the thoracic and lumbar spine was ordered.  Shows spinal stenosis which is severe at L2-3 and L3-4.  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 No acute findings was noted.  Patient seen by neurosurgery.  They recommend steroids.  They feel that patient is poor surgical candidate and they do not feel that patient needs emergent decompression regardless.   Patient does not know why he is on Plavix.  Would be hesitant to stop Plavix at this time unless he needs urgent interventions. Continue with dexamethasone.  PT and OT evaluation.   Continues to have reasonable strength in the lower right lower extremity.  He will need to go to skilled nursing facility for rehabilitation. CT head raised concern for a subacute infarct although this was on the right hemisphere.  MRI brain does not show any acute stroke.  No further work-up is necessary at this time. Stable for the most part.  Neurological status is stable.  Able to move both of his lower extremities.  Due to persistent pain he was started on scheduled acetaminophen.  He is already getting as needed oxycodone.  He was also started on muscle relaxants.  PT and OT is following.  Skilled nursing facility is recommended for short-term rehab. Change steroids to oral at discharge.  Acute kidney injury/hypokalemia Presented with creatinine of 1.64.  Resolved with IV fluids.   Renal ultrasound did not show any acute findings. Potassium level has improved.  Right-sided pneumonia, community-acquired Respiratory status seems to be stable.  Currently on doxycycline.  Procalcitonin less than 0.1.  We will plan for a 5-day course of antibiotics.  Essential hypertension He remains on amlodipine, carvedilol, doxazosin.  Losartan was initially placed on hold due to elevated creatinine.  Resumed after renal function normalized. Blood pressure reasonably well controlled.  Occasional high readings noted.  Nausea Could be experiencing mild gastritis.  Will initiate PPI.  Normocytic anemia Hemoglobin is stable.  No overt blood loss noted.    History of Paget's disease On bisphosphonate  History of COPD Stable.  DVT Prophylaxis: Subcutaneous heparin Code Status: Full code Family Communication: Unable to reach sister yesterday. Disposition Plan: Skilled  nursing facility recommended by PT and OT  Status is: Inpatient  Remains inpatient appropriate because: Inability to ambulate, unsafe discharge     Medications: Scheduled:  acetaminophen  650 mg Oral TID   amLODipine  10 mg Oral  Daily   atorvastatin  40 mg Oral q1800   carvedilol  3.125 mg Oral BID WC   clopidogrel  75 mg Oral Q breakfast   dexamethasone (DECADRON) injection  4 mg Intravenous Q12H   timolol  1 drop Both Eyes BID   And   dorzolamide  1 drop Both Eyes BID   doxazosin  8 mg Oral QHS   doxycycline  100 mg Oral Q12H   finasteride  5 mg Oral Daily   FLUoxetine  20 mg Oral Daily   folic acid  1 mg Oral Daily   heparin  5,000 Units Subcutaneous Q12H   losartan  100 mg Oral QHS   melatonin  3 mg Oral QHS   methocarbamol  500 mg Oral TID   mirabegron ER  25 mg Oral Daily   mirtazapine  30 mg Oral QHS   pantoprazole  40 mg Oral Q1200   polyethylene glycol  17 g Oral BID   pregabalin  25 mg Oral QHS   senna-docusate  2 tablet Oral BID   Continuous:   ZOX:[id number]:acetaminophen **OR** acetaminophen, albuterol, bisacodyl, fluticasone, [START ON 05/10/2021] methocarbamol, ondansetron **OR** ondansetron (ZOFRAN) IV, oxyCODONE  Antibiotics: Anti-infectives (From admission, onward)    Start     Dose/Rate Route Frequency Ordered Stop   05/05/21 2200  doxycycline (VIBRA-TABS) tablet 100 mg        100 mg Oral Every 12 hours 05/04/21 1936 05/10/21 2159   05/05/21 1500  cefTRIAXone (ROCEPHIN) 1 g in sodium chloride 0.9 % 100 mL IVPB  Status:  Discontinued        1 g 200 mL/hr over 30 Minutes Intravenous Every 24 hours 05/04/21 1829 05/04/21 1936   05/04/21 1530  cefTRIAXone (ROCEPHIN) 1 g in sodium chloride 0.9 % 100 mL IVPB        1 g 200 mL/hr over 30 Minutes Intravenous  Once 05/04/21 1529 05/04/21 2326   05/04/21 1530  doxycycline (VIBRA-TABS) tablet 100 mg        100 mg Oral  Once 05/04/21 1529 05/04/21 2155       Objective:  Vital Signs  Vitals:   05/08/21 1640 05/08/21 2024 05/09/21 0539 05/09/21 0806  BP: (!) 147/99 (!) 144/90 (!) 147/105 (!) 154/104  Pulse: 76 78 78 80  Resp: 16 18 18 17   Temp: 98.6 F (37 C) 98.4 F (36.9 C) 98 F (36.7 C) 98.3 F (36.8 C)  TempSrc: Oral Oral Oral Oral   SpO2: 96% 98% 97% 98%  Weight:      Height:        Intake/Output Summary (Last 24 hours) at 05/09/2021 1016 Last data filed at 05/09/2021 0900 Gross per 24 hour  Intake 1040 ml  Output 2950 ml  Net -1910 ml    Filed Weights   05/04/21 1148 05/07/21 0304  Weight: 72.6 kg 78.7 kg    General appearance: Awake alert.  In no distress Resp: Clear to auscultation bilaterally.  Normal effort Cardio: S1-S2 is normal regular.  No S3-S4.  No rubs murmurs or bruit GI: Abdomen is soft.  Mildly tender in the epigastric area without any etiology detail guarding.  No masses organomegaly. Extremities: No edema.   Neurologic: Alert and oriented x3.  Moving all  of his extremities.  Able to lift his legs off the bed.     Lab Results:  Data Reviewed: I have personally reviewed following labs and imaging studies  CBC: Recent Labs  Lab 05/04/21 1318 05/05/21 0220 05/06/21 0315 05/09/21 0241  WBC 8.5 7.5 5.3 7.2  NEUTROABS 6.9  --   --   --   HGB 12.1* 11.5* 11.2* 10.6*  HCT 37.6* 36.0* 34.1* 33.4*  MCV 87.9 87.2 85.9 86.1  PLT 170 147* 111* 191     Basic Metabolic Panel: Recent Labs  Lab 05/04/21 1318 05/05/21 0220 05/06/21 0315 05/09/21 0241  NA 143 139 135 135  K 3.6 3.2* 4.3 4.1  CL 108 107 107 106  CO2 26 23 22 23   GLUCOSE 97 118* 129* 120*  BUN 18 14 16  25*  CREATININE 1.64* 1.00 1.12 1.18  CALCIUM 8.9 8.6* 8.9 8.1*  MG 1.7  --   --   --      GFR: Estimated Creatinine Clearance: 48.3 mL/min (by C-G formula based on SCr of 1.18 mg/dL).  Liver Function Tests: Recent Labs  Lab 05/04/21 1318 05/09/21 0241  AST 15 19  ALT 10 16  ALKPHOS 180* 141*  BILITOT 1.0 0.6  PROT 7.2 5.8*  ALBUMIN 3.7 2.6*      Cardiac Enzymes: Recent Labs  Lab 05/04/21 1318  CKTOTAL 104      Recent Results (from the past 240 hour(s))  Resp Panel by RT-PCR (Flu A&B, Covid) Nasopharyngeal Swab     Status: None   Collection Time: 05/05/21  7:45 AM   Specimen: Nasopharyngeal  Swab; Nasopharyngeal(NP) swabs in vial transport medium  Result Value Ref Range Status   SARS Coronavirus 2 by RT PCR NEGATIVE NEGATIVE Final    Comment: (NOTE) SARS-CoV-2 target nucleic acids are NOT DETECTED.  The SARS-CoV-2 RNA is generally detectable in upper respiratory specimens during the acute phase of infection. The lowest concentration of SARS-CoV-2 viral copies this assay can detect is 138 copies/mL. A negative result does not preclude SARS-Cov-2 infection and should not be used as the sole basis for treatment or other patient management decisions. A negative result may occur with  improper specimen collection/handling, submission of specimen other than nasopharyngeal swab, presence of viral mutation(s) within the areas targeted by this assay, and inadequate number of viral copies(<138 copies/mL). A negative result must be combined with clinical observations, patient history, and epidemiological information. The expected result is Negative.  Fact Sheet for Patients:  07/02/21  Fact Sheet for Healthcare Providers:  07/03/21  This test is no t yet approved or cleared by the [employer] FDA and  has been authorized for detection and/or diagnosis of SARS-CoV-2 by FDA under an Emergency Use Authorization (EUA). This EUA will remain  in effect (meaning this test can be used) for the duration of the COVID-19 declaration under Section 564(b)(1) of the Act, 21 U.S.C.section 360bbb-3(b)(1), unless the authorization is terminated  or revoked sooner.       Influenza A by PCR NEGATIVE NEGATIVE Final   Influenza B by PCR NEGATIVE NEGATIVE Final    Comment: (NOTE) The Xpert Xpress SARS-CoV-2/FLU/RSV plus assay is intended as an aid in the diagnosis of influenza from Nasopharyngeal swab specimens and should not be used as a sole basis for treatment. Nasal washings and aspirates are unacceptable for Xpert Xpress  SARS-CoV-2/FLU/RSV testing.  Fact Sheet for Patients: [employer]  Fact Sheet for Healthcare Providers: [place]  This test is not yet approved or  cleared by the [place] FDA and has been authorized for detection and/or diagnosis of SARS-CoV-2 by FDA under an Emergency Use Authorization (EUA). This EUA will remain in effect (meaning this test can be used) for the duration of the COVID-19 declaration under Section 564(b)(1) of the Act, 21 U.S.C. section 360bbb-3(b)(1), unless the authorization is terminated or revoked.  Performed at [facility], [address] [place], [place] [address]        Radiology Studies: No results found.     LOS: 5 days   Jeorgia Helming [employer]nan  [facility] Pager on [email or web address]  05/09/2021, 10:16 AM

## 2021-05-09 NOTE — Plan of Care (Signed)
  Problem: Nutrition: Goal: Adequate nutrition will be maintained Outcome: Progressing   Problem: Elimination: Goal: Will not experience complications related to urinary retention Outcome: Progressing   

## 2021-05-10 DIAGNOSIS — M5431 Sciatica, right side: Secondary | ICD-10-CM

## 2021-05-10 MED ORDER — OXYCODONE HCL 5 MG PO TABS: 5.0000 mg | ORAL_TABLET | Freq: Four times a day (QID) | ORAL | 0 refills | Status: AC | PRN

## 2021-05-10 MED ORDER — AMLODIPINE BESYLATE 5 MG PO TABS: 10.0000 mg | ORAL_TABLET | Freq: Every day | ORAL | 0 refills | Status: AC

## 2021-05-10 MED ORDER — METHOCARBAMOL 500 MG PO TABS: 500.0000 mg | ORAL_TABLET | Freq: Three times a day (TID) | ORAL | 0 refills | Status: AC | PRN

## 2021-05-10 MED ORDER — ATORVASTATIN CALCIUM 40 MG PO TABS: 40.0000 mg | ORAL_TABLET | Freq: Every day | ORAL | 1 refills | Status: AC

## 2021-05-10 MED ORDER — DEXAMETHASONE 2 MG PO TABS: ORAL_TABLET | ORAL | 0 refills | Status: AC

## 2021-05-10 NOTE — Plan of Care (Signed)
  Problem: Activity: Goal: Risk for activity intolerance will decrease Outcome: Progressing   Problem: Nutrition: Goal: Adequate nutrition will be maintained Outcome: Progressing   Problem: Elimination: Goal: Will not experience complications related to bowel motility Outcome: Progressing Goal: Will not experience complications related to urinary retention Outcome: Progressing   

## 2021-05-10 NOTE — TOC Transition Note (Signed)
Transition of Care [facility]) - CM/SW Discharge Note   Patient Details  Name: Byrant Valent MRN: [id number] Date of Birth: 08-Aug-1940  Transition of Care [facility]) CM/SW Contact:  Tom-Johnson, Hershal Coria, RN Phone Number: 05/10/2021, 1:59 PM   Clinical Narrative:    Patient is scheduled for discharge today. PT/OT recommended SNF for short term rehab but patient and sister,Lillie Ashley Royalty declined SNF. Sister states patient had terrible experience at SNF before and they can assist patient at home with home health services. Patient is active with [facility] and CM called and spoke with Danella Penton [phone]). Shanda Bumps states patient currently has bedside commode recommended by PT/OT and also a rolling walker. Requested CM fax Home health PT/OT order as well as discharge summary. CM faxed requested documents to [phone]) with successful notice received. CM scheduled transportation with [facility]. No further TOC needs noted.   Final next level of care: Home w Home Health Services Barriers to Discharge: Barriers Resolved   Patient Goals and CMS Choice Patient states their goals for this hospitalization and ongoing recovery are:: To return home CMS Medicare.gov Compare Post Acute Care list provided to:: Patient Choice offered to / list presented to : Patient  Discharge Placement                       Discharge Plan and Services In-house Referral: Clinical Social Work   Post Acute Care Choice: Skilled Nursing Facility          DME Arranged: N/A (Patient has necessary DME's at home.) DME Agency: NA       HH Arranged: PT, OT HH Agency: NA [facility]) Date HH Agency Contacted: 05/10/21 Time HH Agency Contacted: 1242 Representative spoke with at [facility] Agency: Danella Penton, SW  Social Determinants of Health (SDOH) Interventions     Readmission Risk Interventions No flowsheet data found.

## 2021-05-10 NOTE — Discharge Summary (Signed)
[facility]  Physician Discharge Summary   Patient ID: Norman Davis MRN: [id number] DOB/AGE: November 26, 1940 81 y.o.  Admit date: 05/04/2021 Discharge date:   05/10/2021   PCP: [facility], [place]:  Sciatica with right lower extremity weakness, improved Ambulatory dysfunction Acute kidney injury, resolved Community-acquired pneumonia, resolved Essential hypertension Normocytic anemia History of Paget's disease History of COPD    RECOMMENDATIONS FOR OUTPATIENT FOLLOW UP: Patient also to follow-up with his primary care providers at [facility] Patient given contact information for Dr. Saintclair Halsted with neurosurgery to arrange follow-up follow-up pain and weakness persists.    Home Health: PT and OT Equipment/Devices: None  CODE STATUS: Full code  DISCHARGE CONDITION: fair  Diet recommendation: Heart healthy  INITIAL HISTORY: 81 y.o. 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 Patient has poorly controlled sciatica with chronic back pain shooting down to lateral side of the right leg.  Patient apparently had several falls as a result of this weakness.  Has had previous back surgery though not yet at home.  CT head raise concern for subacute infarct.  Patient was hospitalized for further management.   Reason for Visit: Right leg weakness with inability to ambulate   Consultants: Neurosurgery   Procedures: Transthoracic echocardiogram.  Carotid Doppler   HOSPITAL COURSE:   Sciatica with right lower extremity weakness with ambulatory dysfunction Patient has had inability to ambulate as a result of his back pain which has been radiating down his right leg.  He has had multiple falls.  Waiting on PT and OT  evaluation. MRI of the thoracic and lumbar spine was ordered.  Shows spinal stenosis which is severe at L2-3 and L3-4.  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 No acute findings was noted.  Patient seen by neurosurgery.  They recommend steroids.  They feel that patient is poor surgical candidate and they do not feel that patient needs emergent decompression regardless.   Patient does not know why he is on Plavix.  Would be hesitant to stop Plavix at this time unless he needs urgent interventions. Continue with dexamethasone.  PT and OT evaluation.  Continues to have reasonable strength in the lower right lower extremity.  He will need to go to skilled nursing facility for rehabilitation. CT head raised concern for a subacute infarct although this was on the right hemisphere.  MRI brain does not show any acute stroke.  No further work-up is necessary at this time. Patient was given around-the-clock acetaminophen and muscle relaxants.  Symptoms have improved.  Continue with oxycodone as needed.   Seen by PT and OT who recommended skilled nursing facility for rehab.  Multiple discussions between social worker patient and his sister.  It was finally decided by patient and his sister that he would go home and will be cared for by his sister.  Home health will be ordered and will be facilitated via the [place]. Will discharge home on dexamethasone, long taper.  Acute kidney injury/hypokalemia Presented with creatinine of 1.64.  Resolved with IV fluids.   Renal ultrasound did not show any acute findings.  Right-sided pneumonia, community-acquired Completed 5-day course of doxycycline.  Respiratory status is stable.   Essential hypertension Continue home medications.   Nausea Given PPI.  Resolved.  He has been  tolerating his diet without difficulty.   Normocytic anemia Hemoglobin is stable.  No overt blood loss noted.    History of Paget's disease On  bisphosphonate  History of COPD Stable.   Patient is stable.  Okay for discharge home today with sister.  He will need to be sent home by patient transport/EMS.    PERTINENT LABS:  The results of significant diagnostics from this hospitalization (including imaging, microbiology, ancillary and laboratory) are listed below for reference.    Microbiology: Recent Results (from the past 240 hour(s))  Resp Panel by RT-PCR (Flu A&B, Covid) Nasopharyngeal Swab     Status: None   Collection Time: 05/05/21  7:45 AM   Specimen: Nasopharyngeal Swab; Nasopharyngeal(NP) swabs in vial transport medium  Result Value Ref Range Status   SARS Coronavirus 2 by RT PCR NEGATIVE NEGATIVE Final    Comment: (NOTE) SARS-CoV-2 target nucleic acids are NOT DETECTED.  The SARS-CoV-2 RNA is generally detectable in upper respiratory specimens during the acute phase of infection. The lowest concentration of SARS-CoV-2 viral copies this assay can detect is 138 copies/mL. A negative result does not preclude SARS-Cov-2 infection and should not be used as the sole basis for treatment or other patient management decisions. A negative result may occur with  improper specimen collection/handling, submission of specimen other than nasopharyngeal swab, presence of viral mutation(s) within the areas targeted by this assay, and inadequate number of viral copies(<138 copies/mL). A negative result must be combined with clinical observations, patient history, and epidemiological information. The expected result is Negative.  Fact Sheet for Patients:  EntrepreneurPulse.com.au  Fact Sheet for Healthcare Providers:  IncredibleEmployment.be  This test is no t yet approved or cleared by the [place] FDA and  has been authorized for detection and/or diagnosis of SARS-CoV-2 by FDA under an Emergency Use Authorization (EUA). This EUA will remain  in effect (meaning this test can be  used) for the duration of the COVID-19 declaration under Section 564(b)(1) of the Act, 21 U.S.C.section 360bbb-3(b)(1), unless the authorization is terminated  or revoked sooner.       Influenza A by PCR NEGATIVE NEGATIVE Final   Influenza B by PCR NEGATIVE NEGATIVE Final    Comment: (NOTE) The Xpert Xpress SARS-CoV-2/FLU/RSV plus assay is intended as an aid in the diagnosis of influenza from Nasopharyngeal swab specimens and should not be used as a sole basis for treatment. Nasal washings and aspirates are unacceptable for Xpert Xpress SARS-CoV-2/FLU/RSV testing.  Fact Sheet for Patients: EntrepreneurPulse.com.au  Fact Sheet for Healthcare Providers: IncredibleEmployment.be  This test is not yet approved or cleared by the [place] FDA and has been authorized for detection and/or diagnosis of SARS-CoV-2 by FDA under an Emergency Use Authorization (EUA). This EUA will remain in effect (meaning this test can be used) for the duration of the COVID-19 declaration under Section 564(b)(1) of the Act, 21 U.S.C. section 360bbb-3(b)(1), unless the authorization is terminated or revoked.  Performed at [facility], [place] [address] [place], [place] [address]      Labs:  COVID-19 Labs   Lab Results  Component Value Date   Fisher NEGATIVE 05/05/2021      Basic Metabolic Panel: Recent Labs  Lab 05/04/21 1318 05/05/21 0220 05/06/21 0315 05/09/21 0241  NA 143 139 135 135  K 3.6 3.2* 4.3 4.1  CL 108 107 107 106  CO2 26 23 22 23   GLUCOSE 97 118* 129* 120*  BUN 18 14 16  25*  CREATININE 1.64* 1.00  1.12 1.18  CALCIUM 8.9 8.6* 8.9 8.1*  MG 1.7  --   --   --    Liver Function Tests: Recent Labs  Lab 05/04/21 1318 05/09/21 0241  AST 15 19  ALT 10 16  ALKPHOS 180* 141*  BILITOT 1.0 0.6  PROT 7.2 5.8*  ALBUMIN 3.7 2.6*    CBC: Recent Labs  Lab 05/04/21 1318 05/05/21 0220 05/06/21 0315 05/09/21 0241  WBC 8.5  7.5 5.3 7.2  NEUTROABS 6.9  --   --   --   HGB 12.1* 11.5* 11.2* 10.6*  HCT 37.6* 36.0* 34.1* 33.4*  MCV 87.9 87.2 85.9 86.1  PLT 170 147* 111* 191   Cardiac Enzymes: Recent Labs  Lab 05/04/21 1318  CKTOTAL 104   BNP: BNP (last 3 results) Recent Labs    05/04/21 1318  BNP 76.2      IMAGING STUDIES DG Chest 1 View  Result Date: 05/04/2021 CLINICAL DATA:  [place] getting out of bed.  Shortness of breath. EXAM: CHEST  1 VIEW COMPARISON:  04/19/2017.  04/16/2017. FINDINGS: Mild cardiomegaly. Aortic atherosclerosis and tortuosity. Left lung is clear. Some emphysematous changes at the left apex. More extensive emphysema within the right lung. Increased markings in the right lung could reflect a right pneumonia. Consider two-view chest radiography when possible. IMPRESSION: Underlying emphysema, worse on the right than the left. Increased markings in the right lung neck could indicate early pneumonia. Consider two-view chest radiography when able. Electronically Signed   By: Nelson Chimes M.D.   On: 05/04/2021 13:01   DG Chest 2 View  Result Date: 05/04/2021 CLINICAL DATA:  Shortness of breath. EXAM: CHEST - 2 VIEW COMPARISON:  Chest x-ray 04/16/2017.  Chest CT 04/14/2017 FINDINGS: Emphysematous changes are again seen throughout both lungs. There is linear atelectasis or scarring in the left lung base. The lungs are otherwise clear. There is no pleural effusion or pneumothorax. The aorta is tortuous and the heart is mildly enlarged, unchanged. No acute fractures are seen. IVC filter is present. IMPRESSION: 1. No acute cardiopulmonary process. 2.  Emphysema (ICD10-J43.9). Electronically Signed   By: Ronney Asters M.D.   On: 05/04/2021 21:02   CT Head Wo Contrast  Result Date: 05/04/2021 CLINICAL DATA:  Mental status change, unknown cause. Additional history provided: Weakness in legs, shortness of breath. EXAM: CT HEAD WITHOUT CONTRAST TECHNIQUE: Contiguous axial images were obtained from the base  of the skull through the vertex without intravenous contrast. COMPARISON:  MRI brain and MRA head 05/24/2017. head CT 03/20/2018. FINDINGS: Brain: Mild generalized cerebral atrophy. Small lacunar infarct within the right caudate head, new as compared to the head CT of 03/20/2018 but otherwise age indeterminate (series 3, image 31). 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 Background moderately advanced patchy and ill-defined hypoattenuation within the cerebral white matter, nonspecific but compatible with chronic small vessel ischemic disease. There is no acute intracranial hemorrhage. No demarcated cortical infarct. No extra-axial fluid collection. No evidence of an intracranial mass. No midline shift. Vascular: No hyperdense vessel.  Atherosclerotic calcifications. Skull: Normal. Negative for fracture or focal lesion. Sinuses/Orbits: Visualized orbits show no acute finding. Complete opacification of the left frontal sinus. Frothy secretions within a posterior left ethmoid air cell. IMPRESSION: No acute intracranial hemorrhage or acute demarcated cortical infarction. Small lacunar infarct within the right basal ganglia, new from the prior head CT of 03/20/2018 but otherwise age-indeterminate. A brain MRI may be obtained for further evaluation, as clinically  warranted. Moderately advanced chronic small vessel ischemic disease, as described. Mild generalized cerebral atrophy. Paranasal sinus disease, as outlined. Electronically Signed   By: Kellie Simmering D.O.   On: 05/04/2021 12:52   MR BRAIN WO CONTRAST  Result Date: 05/04/2021 CLINICAL DATA:  Initial evaluation for neuro deficit, stroke suspected. EXAM: MRI HEAD WITHOUT CONTRAST TECHNIQUE: Multiplanar, multiecho pulse sequences of the brain and surrounding structures were obtained without intravenous contrast. COMPARISON:  CT from earlier the same day. FINDINGS: Brain: Generalized age-related  cerebral atrophy. Patchy and confluent T2/FLAIR hyperintensity involving the periventricular and deep white matter both cerebral hemispheres as well as the pons, most consistent with chronic small vessel ischemic disease, moderately advanced in nature. Few scattered superimposed remote lacunar infarcts present about the hemispheric cerebral white matter, basal ganglia, and thalami. Few probable tiny remote cerebellar infarcts noted as well. No abnormal foci of restricted diffusion to suggest acute or subacute ischemia. Gray-white matter differentiation maintained. No encephalomalacia to suggest chronic cortical infarction. No evidence for acute or chronic intracranial hemorrhage. No mass lesion, midline shift or mass effect. Mild ventricular prominence related to global parenchymal volume loss of hydrocephalus. No extra-axial fluid collection. Pituitary gland suprasellar region within normal limits. Midline structures intact. Vascular: Major intracranial vascular flow voids are maintained. Major intracranial arterial vasculature appears somewhat dolichoectatic in nature. Skull and upper cervical spine: Craniocervical junction within normal limits. Bone marrow signal intensity diffusely heterogeneous without focal marrow replacing lesion. No scalp soft tissue abnormality. Sinuses/Orbits: Prior ocular lens replacement noted on the left. Globes and orbital soft tissues demonstrate no acute finding. Chronic left frontal and ethmoidal sinusitis noted. Small right mastoid effusion noted, of doubtful significance. Visualized nasopharynx unremarkable. Other: None. IMPRESSION: 1. No acute intracranial abnormality. 2. Age-related cerebral atrophy with moderately advanced chronic microvascular ischemic disease. 3. Chronic left frontal and ethmoidal sinusitis. Electronically Signed   By: Jeannine Boga M.D.   On: 05/04/2021 20:13   MR THORACIC SPINE W WO CONTRAST  Result Date: 05/04/2021 CLINICAL DATA:  Initial  evaluation for mid back pain, lower extremity weakness. EXAM: MRI THORACIC WITHOUT AND WITH CONTRAST TECHNIQUE: Multiplanar and multiecho pulse sequences of the thoracic spine were obtained without and with intravenous contrast. CONTRAST:  47mL GADAVIST GADOBUTROL 1 MMOL/ML IV SOLN COMPARISON:  Previous MRI from 05/12/2018. FINDINGS: Alignment: Physiologic with preservation of the normal thoracic kyphosis. No listhesis. Vertebrae: Chronic compression fracture involving the T12 vertebral body with up to approximate 50% height loss and trace 2 mm bony retropulsion. Otherwise, vertebral body height maintained with no other acute or subacute fracture. Bone marrow signal intensity mildly heterogeneous but overall within normal limits. No worrisome osseous lesions. No abnormal marrow edema or enhancement. Cord:  Normal signal morphology.  No abnormal enhancement. Paraspinal and other soft tissues: Paraspinous soft tissues demonstrate no acute finding. Small layering bilateral pleural effusions with associated atelectasis noted. Disc levels: T9-10: Negative interspace. Bilateral facet hypertrophy. No spinal stenosis. Mild left greater than right foraminal stenosis. T10-11: Degenerative disc bulge with bilateral facet hypertrophy. No significant spinal stenosis. Moderate bilateral foraminal narrowing. Otherwise, no other significant disc pathology seen within the thoracic spine for age. No spinal stenosis. Foramina otherwise remain patent. IMPRESSION: 1. No acute abnormality within the thoracic spine. No findings to explain patient's symptoms. 2. Chronic compression fracture involving the T12 vertebral body with up to 50% height loss and trace 2 mm bony retropulsion. 3. Mild to moderate bilateral foraminal stenosis at T9-10 and T10-11, largely due to facet hypertrophy. No significant spinal  stenosis within the thoracic spine. 4. Small layering bilateral pleural effusions with associated atelectasis. Electronically Signed    By: Jeannine Boga M.D.   On: 05/04/2021 20:23   MR Lumbar Spine W Wo Contrast  Result Date: 05/04/2021 CLINICAL DATA:  Initial evaluation for low back pain, prior surgery, lower extremity weakness. EXAM: MRI LUMBAR SPINE WITHOUT AND WITH CONTRAST TECHNIQUE: Multiplanar and multiecho pulse sequences of the lumbar spine were obtained without and with intravenous contrast. CONTRAST:  48mL GADAVIST GADOBUTROL 1 MMOL/ML IV SOLN COMPARISON:  Previous MRI from 05/12/2018. FINDINGS: Segmentation: Standard. Same numbering system employed as on previous exam. Alignment: Chronic 4 mm anterolisthesis of L4 on L5. Trace retrolisthesis of L1 on L2, L2 on L3, L3 on L4. Alignment otherwise normal with preservation of the normal lumbar lordosis. Vertebrae: Susceptibility artifact from prior PLIF at L5-S1. Postoperative changes noted about the partially visualized left iliac wing and left SI joint. Chronic T12 compression fracture noted, better evaluated on concomitant MRI of the thoracic spine. Vertebral body height otherwise maintained with no other acute or chronic fracture. Bone marrow signal intensity diffusely heterogeneous. No worrisome osseous lesions. No MRA evidence for active osteomyelitis discitis within the lumbar spine. Reactive marrow edema and enhancement about the left aspect of the L1-2 interspace felt to be degenerative in nature, with no significant signal abnormality within the intervening L1-2 interspace. 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 Conus medullaris and cauda equina: Conus extends to the L1 level. Conus medullaris within normal limits. Nerve roots of the cauda equina are somewhat irregular and undulating related to distal stenosis. Paraspinal and other soft tissues: Chronic postoperative changes present within the posterior paraspinous soft tissues. Small benign appearing cyst partially visualize within the interpolar left kidney. Visualized visceral  structures otherwise unremarkable. Disc levels: L1-2: Disc bulge with disc desiccation and intervertebral disc space narrowing. Disc bulging asymmetric to the left. Prominent reactive endplate change, also worse on the left. Superimposed left foraminal to extraforaminal disc protrusion closely approximates the exiting left L1 nerve root (series 26, image 14). Mild bilateral facet hypertrophy. Resultant mild canal with moderate left lateral recess stenosis. Mild right with moderate left L1 foraminal narrowing. L2-3: Disc desiccation with mild disc bulge. Disc bulging asymmetric to the left. Associated reactive endplate spurring. Moderate facet and ligament flavum hypertrophy. Resultant fairly severe spinal stenosis with the thecal sac measuring 7 mm in AP diameter at its most narrow point. Mild right with moderate left L2 foraminal stenosis. L3-4: Severe degenerative intervertebral disc space narrowing with ankylosis, progressed from prior, likely combination of degenerative changes in sequelae of prior osteomyelitis discitis. Associated reactive endplate change with marginal endplate osteophytic spurring. Moderate facet hypertrophy with ankylosis of the L3-4 facets as well. Resultant severe spinal stenosis, with the thecal sac measuring 7 mm in AP diameter. Moderate bilateral L3 foraminal narrowing. L4-5: Advanced degenerative intervertebral disc space narrowing with diffuse disc bulge, disc desiccation, and reactive endplate spurring. Moderate bilateral facet hypertrophy. Resultant moderate canal with bilateral subarticular stenosis. Mild bilateral L4 foraminal narrowing. L5-S1: Prior PLIF. No residual spinal stenosis. Foramina remain patent. IMPRESSION: 1. No acute abnormality within the lumbar spine. 2. 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 No convincing evidence for active infection within the lumbar spine. 3. Multifactorial degenerative changes at L2-3 and  L3-4 with resultant severe spinal stenosis, with moderate bilateral L3 foraminal narrowing. 4. Left eccentric disc osteophyte and facet hypertrophy at L1-2 with resultant moderate left  lateral recess and foraminal stenosis. Reactive endplate edema and enhancement at this level favored to be degenerative. 5. Prior PLIF at L5-S1 without residual or recurrent stenosis. Electronically Signed   By: Jeannine Boga M.D.   On: 05/04/2021 20:44   US RENAL  Result Date: 05/04/2021 CLINICAL DATA:  Acute kidney injury. EXAM: RENAL / URINARY TRACT ULTRASOUND COMPLETE COMPARISON:  CT abdomen and pelvis 05/12/2018. FINDINGS: Right Kidney: Renal measurements: 10.3 x 3.8 x 5.0 cm = volume: 102 mL. Echogenicity within normal limits. No mass or hydronephrosis visualized. Left Kidney: Renal measurements: 11.2 x 5.7 x 4.3 cm = volume: 145 mL. Echogenicity within normal limits. No mass or hydronephrosis visualized. Bladder: Appears normal for degree of bladder distention. Other: None. IMPRESSION: Within normal limits. Electronically Signed   By: Ronney Asters M.D.   On: 05/04/2021 20:05   ECHOCARDIOGRAM COMPLETE  Result Date: 05/05/2021    ECHOCARDIOGRAM REPORT   Patient Name:   Shivaay Tones Date of Exam: 05/05/2021 Medical Rec #:  [id number]   Height:       69.0 in Accession #:    [id number]  Weight:       160.0 lb Date of Birth:  09-17-1940   BSA:          1.879 m Patient Age:    70 years    BP:           132/89 mmHg Patient Gender: M           HR:           90 bpm. Exam Location:  Inpatient Procedure: 2D Echo, Cardiac Doppler and Color Doppler Indications:    stroke  History:        Patient has prior history of Echocardiogram examinations, most                 recent 04/13/2017. Signs/Symptoms:Shortness of Breath; Risk                 Factors:Hypertension and Dyslipidemia. [place].  Sonographer:    Beryle Beams Referring Phys: [id number] [place]  1. Left ventricular ejection fraction, by estimation, is 50 to 55%.  The left ventricle has low normal function. The left ventricle has no regional wall motion abnormalities. There is mild asymmetric left ventricular hypertrophy of the basal-septal segment. Left ventricular diastolic parameters are consistent with Grade I diastolic dysfunction (impaired relaxation).  2. Right ventricular systolic function is normal. The right ventricular size is normal. Tricuspid regurgitation signal is inadequate for assessing PA pressure.  3. The mitral valve is degenerative. Trivial mitral valve regurgitation. No evidence of mitral stenosis.  4. The aortic valve was not well visualized. Aortic valve regurgitation is not visualized. Aortic valve sclerosis is present, with no evidence of aortic valve stenosis.  5. Aortic dilatation noted. There is mild dilatation of the aortic root, measuring 38 mm. Comparison(s): Compared to prior TTE in 2018, there is no significant change. Conclusion(s)/Recommendation(s): No intracardiac source of embolism detected on this transthoracic study. Consider a transesophageal echocardiogram to exclude cardiac source of embolism if clinically indicated. FINDINGS  Left Ventricle: Left ventricular ejection fraction, by estimation, is 50 to 55%. The left ventricle has low normal function. The left ventricle has no regional wall motion abnormalities. The left ventricular internal cavity size was normal in size. There is mild asymmetric left ventricular hypertrophy of the basal-septal segment. Left ventricular diastolic parameters are consistent with Grade I diastolic dysfunction (impaired relaxation). Right Ventricle: The right ventricular size  is normal. Right vetricular wall thickness was not well visualized. Right ventricular systolic function is normal. Tricuspid regurgitation signal is inadequate for assessing PA pressure. Left Atrium: Left atrial size was normal in size. Right Atrium: Right atrial size was normal in size. Pericardium: There is no evidence of  pericardial effusion. Mitral Valve: The mitral valve is degenerative in appearance. There is mild thickening of the mitral valve leaflet(s). There is mild calcification of the mitral valve leaflet(s). Mild to moderate mitral annular calcification. Trivial mitral valve regurgitation. No evidence of mitral valve stenosis. Tricuspid Valve: The tricuspid valve is normal in structure. Tricuspid valve regurgitation is trivial. Aortic Valve: The aortic valve was not well visualized. Aortic valve regurgitation is not visualized. Aortic valve sclerosis is present, with no evidence of aortic valve stenosis. Aortic valve mean gradient measures 3.0 mmHg. Aortic valve peak gradient measures 5.4 mmHg. Aortic valve area, by VTI measures 2.13 cm. Pulmonic Valve: The pulmonic valve was not well visualized. Aorta: Aortic dilatation noted. There is mild dilatation of the aortic root, measuring 38 mm. Venous: The inferior vena cava was not well visualized. IAS/Shunts: The atrial septum is grossly normal.  LEFT VENTRICLE PLAX 2D LVIDd:         3.51 cm     Diastology LVIDs:         2.58 cm     LV e' medial:    6.53 cm/s LV PW:         1.12 cm     LV E/e' medial:  10.0 LV IVS:        0.89 cm     LV e' lateral:   9.57 cm/s LVOT diam:     2.20 cm     LV E/e' lateral: 6.8 LV SV:         49 LV SV Index:   26 LVOT Area:     3.80 cm  LV Volumes (MOD) LV vol d, MOD A2C: 92.6 ml LV vol s, MOD A2C: 39.6 ml LV vol s, MOD A4C: 35.5 ml LV SV MOD A2C:     53.0 ml RIGHT VENTRICLE RV S prime:     9.68 cm/s TAPSE (M-mode): 1.9 cm LEFT ATRIUM           Index        RIGHT ATRIUM           Index LA diam:      2.90 cm 1.54 cm/m   RA Area:     11.80 cm 6.28 cm/m LA Vol (A2C): 43.6 ml 23.20 ml/m LA Vol (A4C): 80.8 ml 43.00 ml/m  AORTIC VALVE                    PULMONIC VALVE AV Area (Vmax):    2.78 cm     RVOT Peak grad: 1 mmHg AV Area (Vmean):   2.65 cm AV Area (VTI):     2.13 cm AV Vmax:           116.00 cm/s AV Vmean:          77.100 cm/s AV VTI:             0.232 m AV Peak Grad:      5.4 mmHg AV Mean Grad:      3.0 mmHg LVOT Vmax:         84.90 cm/s LVOT Vmean:        53.800 cm/s LVOT VTI:  0.130 m LVOT/AV VTI ratio: 0.56  AORTA Ao Root diam: 3.70 cm MITRAL VALVE MV Area (PHT): 5.27 cm    SHUNTS MV Decel Time: 144 msec    Systemic VTI:  0.13 m MV E velocity: 65.10 cm/s  Systemic Diam: 2.20 cm MV A velocity: 49.70 cm/s  Pulmonic VTI:  0.065 m MV E/A ratio:  1.31 Gwyndolyn Kaufman MD Electronically signed by Gwyndolyn Kaufman MD Signature Date/Time: 05/05/2021/1:03:34 PM    Final    DG Hip Unilat W or Wo Pelvis 2-3 Views Right  Result Date: 05/04/2021 CLINICAL DATA:  Shortness of breath.  [place] getting out of bed. EXAM: DG HIP (WITH OR WITHOUT PELVIS) 2-3V RIGHT COMPARISON:  None. 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 No evidence of acute fracture. Chronic joint space narrowing at both hip joints. Previous lumbosacral decompression and fusion and previous ORIF in the region of the left iliac bone. IVC filter is noted. IMPRESSION: No acute or traumatic finding. Chronic changes of Paget's disease. Old operative changes as above. Electronically Signed   By: Nelson Chimes M.D.   On: 05/04/2021 12:59   DG FEMUR MIN 2 VIEWS LEFT  Result Date: 05/04/2021 CLINICAL DATA:  Weakness. EXAM: LEFT FEMUR 2 VIEWS COMPARISON:  March 20, 2018. FINDINGS: There is no evidence of fracture or other focal bone lesions. Soft tissues are unremarkable. IMPRESSION: Negative. Electronically Signed   By: Marijo Conception M.D.   On: 05/04/2021 15:08   VAS US CAROTID  Result Date: 05/07/2021 Carotid Arterial Duplex Study Patient Name:  Tasha Graybill  Date of Exam:   05/05/2021 Medical Rec #: [id number]    Accession #:    [id number] Date of Birth: 14-Oct-1940    Patient Gender: M Patient Age:   43 years Exam Location:  [facility] Procedure:      VAS US CAROTID Referring Phys: Wynetta Fines  --------------------------------------------------------------------------------  Indications:       CVA. Risk Factors:      Hypertension, hyperlipidemia. Comparison Study:  05/24/2017 - Right Carotid: Velocities in the right ICA are                    consistent with a 1-39% stenosis.                     Left Carotid: Velocities in the left ICA are consistent with                    a 1-39%                    stenosis.                     Vertebrals: Both vertebral arteries were patent with                    antegrade flow.                    Subclavians: Normal flow hemodynamics were seen in bilateral                    subclavian arteries. Performing Technologist: Oliver Hum RVT  Examination Guidelines: A complete evaluation includes B-mode imaging, spectral Doppler, color Doppler, and power Doppler as needed of all accessible portions of each vessel. Bilateral testing is considered an integral part of a complete examination. Limited examinations for reoccurring indications may be performed as noted.  Right Carotid Findings: +----------+--------+--------+--------+-----------------------+--------+             PSV cm/s EDV cm/s Stenosis Plaque Description      Comments  +----------+--------+--------+--------+-----------------------+--------+  CCA Prox   88       28                smooth and heterogenous           +----------+--------+--------+--------+-----------------------+--------+  CCA Distal 55       16                smooth and heterogenous           +----------+--------+--------+--------+-----------------------+--------+  ICA Prox   37       16                smooth and heterogenous           +----------+--------+--------+--------+-----------------------+--------+  ICA Distal 51       18                                        tortuous  +----------+--------+--------+--------+-----------------------+--------+  ECA        30       5                                                    +----------+--------+--------+--------+-----------------------+--------+ +----------+--------+-------+--------+-------------------+             PSV cm/s EDV cms Describe Arm Pressure (mmHG)  +----------+--------+-------+--------+-------------------+  Subclavian 40                                             +----------+--------+-------+--------+-------------------+ +---------+--------+--+--------+--+---------+  Vertebral PSV cm/s 29 EDV cm/s 11 Antegrade  +---------+--------+--+--------+--+---------+  Left Carotid Findings: +----------+--------+--------+--------+-----------------------+--------+             PSV cm/s EDV cm/s Stenosis Plaque Description      Comments  +----------+--------+--------+--------+-----------------------+--------+  CCA Prox   81       22                smooth and heterogenous           +----------+--------+--------+--------+-----------------------+--------+  CCA Distal 52       19                smooth and heterogenous           +----------+--------+--------+--------+-----------------------+--------+  ICA Prox   45       11                smooth and heterogenous tortuous  +----------+--------+--------+--------+-----------------------+--------+  ICA Distal 51       19                                        tortuous  +----------+--------+--------+--------+-----------------------+--------+  ECA        41       8                                                   +----------+--------+--------+--------+-----------------------+--------+ +----------+--------+--------+--------+-------------------+  PSV cm/s EDV cm/s Describe Arm Pressure (mmHG)  +----------+--------+--------+--------+-------------------+  Subclavian 71                                              +----------+--------+--------+--------+-------------------+ +---------+--------+--+--------+-+---------+  Vertebral PSV cm/s 33 EDV cm/s 8 Antegrade  +---------+--------+--+--------+-+---------+   Summary: Right Carotid:  Velocities in the right ICA are consistent with a 1-39% stenosis. Left Carotid: Velocities in the left ICA are consistent with a 1-39% stenosis. Vertebrals: Bilateral vertebral arteries demonstrate antegrade flow. *See table(s) above for measurements and observations.  Electronically signed by Antony Contras MD on 05/07/2021 at 6:05:53 PM.    Final     DISCHARGE EXAMINATION: Vitals:   05/09/21 1959 05/10/21 0539 05/10/21 0541 05/10/21 0847  BP: (!) 135/93 (!) 161/100 (!) 151/91 (!) 144/95  Pulse: 85 78 74 83  Resp: 18 18  16   Temp: 99.2 F (37.3 C) 98 F (36.7 C)  98.5 F (36.9 C)  TempSrc: Oral Oral  Oral  SpO2: 98% 98%  95%  Weight:      Height:       General appearance: Awake alert.  In no distress Resp: Clear to auscultation bilaterally.  Normal effort Cardio: S1-S2 is normal regular.  No S3-S4.  No rubs murmurs or bruit GI: Abdomen is soft.  Nontender nondistended.  Bowel sounds are present normal.  No masses organomegaly Extremities: No edema.  Moving all of his extremities   DISPOSITION: Home  Discharge Instructions     Call MD for:  difficulty breathing, headache or visual disturbances   Complete by: As directed    Call MD for:  extreme fatigue   Complete by: As directed    Call MD for:  persistant dizziness or light-headedness   Complete by: As directed    Call MD for:  persistant nausea and vomiting   Complete by: As directed    Call MD for:  severe uncontrolled pain   Complete by: As directed    Call MD for:  temperature >100.4   Complete by: As directed    Diet - low sodium heart healthy   Complete by: As directed    Discharge instructions   Complete by: As directed    Please be sure to follow-up with your primary care provider in 1 week.  Call the neurosurgeons office to schedule appointment if your symptoms are not better in a couple of weeks.  Take your medications as prescribed.  Monitor blood pressures.  You were cared for by a hospitalist during your  hospital stay. If you have any questions about your discharge medications or the care you received while you were in the hospital after you are discharged, you can call the unit and asked to speak with the hospitalist on call if the hospitalist that took care of you is not available. Once you are discharged, your primary care physician will handle any further medical issues. Please note that NO REFILLS for any discharge medications will be authorized once you are discharged, as it is imperative that you return to your primary care physician (or establish a relationship with a primary care physician if you do not have one) for your aftercare needs so that they can reassess your need for medications and monitor your lab values. If you do not have a primary care physician, you can call [phone] for a physician referral.  Increase activity slowly   Complete by: As directed           Allergies as of 05/10/2021       Reactions   Gabapentin    Other reaction(s): Delirium   Penicillins    Unknown Has patient had a PCN reaction causing immediate rash, facial/tongue/throat swelling, SOB or lightheadedness with hypotension: NO Has patient had a PCN reaction causing severe rash involving mucus membranes or skin necrosis: NO Has patient had a PCN reaction that required hospitalization NO Has patient had a PCN reaction occurring within the last 10 years: NO If all of the above answers are "NO", then may proceed with Cephalosporin use.   Tramadol    Other reaction(s): Delirium   Aspirin Rash        Medication List     STOP taking these medications    Chlorhexidine Gluconate Cloth 2 % Pads   feeding supplement (PRO-STAT SUGAR FREE 64) Liqd   HYDROcodone-acetaminophen 5-325 MG tablet Commonly known as: NORCO/VICODIN   mouth rinse Liqd solution   Spiriva HandiHaler 18 MCG inhalation capsule Generic drug: tiotropium       TAKE these medications    acetaminophen 500 MG tablet Commonly  known as: TYLENOL Take 500 mg by mouth daily. What changed: Another medication with the same name was removed. Continue taking this medication, and follow the directions you see here.   albuterol 108 (90 Base) MCG/ACT inhaler Commonly known as: VENTOLIN HFA Inhale 2 puffs into the lungs every 4 (four) hours as needed for wheezing or shortness of breath.   amLODipine 5 MG tablet Commonly known as: NORVASC Take 2 tablets (10 mg total) by mouth daily. What changed: how much to take   atorvastatin 40 MG tablet Commonly known as: LIPITOR Take 1 tablet (40 mg total) by mouth daily at 6 PM.   clopidogrel 75 MG tablet Commonly known as: PLAVIX Take 75 mg by mouth daily with breakfast.   dexamethasone 2 MG tablet Commonly known as: DECADRON Take 2 tablets twice daily for 7 days, then take 2 tablets once daily for 1 week, then take 1 tablet once daily for 1 week, then STOP   dorzolamidel-timolol 22.3-6.8 MG/ML Soln ophthalmic solution Commonly known as: COSOPT Place 1 drop into both eyes 2 (two) times daily.   doxazosin 8 MG tablet Commonly known as: CARDURA Take 4 mg by mouth at bedtime.   finasteride 5 MG tablet Commonly known as: PROSCAR Take 5 mg by mouth daily.   FLUoxetine 20 MG capsule Commonly known as: PROZAC Take 40 mg by mouth daily.   fluticasone 50 MCG/ACT nasal spray Commonly known as: FLONASE Place 2 sprays into both nostrils daily.   hydrochlorothiazide 25 MG tablet Commonly known as: HYDRODIURIL Take 25 mg by mouth daily.   losartan 100 MG tablet Commonly known as: COZAAR Take 100 mg by mouth at bedtime.   meloxicam 15 MG tablet Commonly known as: MOBIC Take 15 mg by mouth daily.   methocarbamol 500 MG tablet Commonly known as: ROBAXIN Take 1 tablet (500 mg total) by mouth every 8 (eight) hours as needed for muscle spasms.   mirtazapine 30 MG tablet Commonly known as: REMERON Take 30 mg by mouth at bedtime.   oxyCODONE 5 MG immediate release  tablet Commonly known as: Oxy IR/ROXICODONE Take 1 tablet (5 mg total) by mouth every 6 (six) hours as needed for severe pain.   paliperidone 3 MG 24 hr tablet Commonly known as: INVEGA Take 3  mg by mouth daily.   pantoprazole 40 MG tablet Commonly known as: PROTONIX Take 40 mg by mouth 2 (two) times daily.   polyethylene glycol 17 g packet Commonly known as: MIRALAX / GLYCOLAX Take 17 g by mouth daily.   potassium chloride 20 MEQ packet Commonly known as: KLOR-CON Take 20 mEq by mouth every other day.   pregabalin 25 MG capsule Commonly known as: LYRICA Take 25 mg by mouth at bedtime.   senna 8.6 MG Tabs tablet Commonly known as: SENOKOT Take 2 tablets by mouth every other day.   Vitamin D (Ergocalciferol) 1.25 MG (50000 UNIT) Caps capsule Commonly known as: DRISDOL Take 50,000 Units by mouth every 7 (seven) days. Wednesdays          Follow-up Information     [facility] Follow up in 1 week(s).   Contact information: [place] [id number] [phone]         Kary Kos, MD. Schedule an appointment as soon as possible for a visit.   Specialty: Neurosurgery Why: If symptoms worsen Contact information: [address] [place] [address] [place] [address] [phone]                 TOTAL DISCHARGE TIME: 35 minutes  Fulda  [facility] Pager on [email or web address]  05/10/2021, 1:06 PM

## 2021-05-10 NOTE — Progress Notes (Signed)
DISCHARGE NOTE HOME Norman Davis to be discharged Home per MD order. Discussed prescriptions and follow up appointments with the patient. Prescriptions given to patient; medication list explained in detail. Patient verbalized understanding.  Skin clean, dry and intact without evidence of skin break down, no evidence of skin tears noted. IV catheter discontinued intact. Site without signs and symptoms of complications. Dressing and pressure applied. Pt denies pain at the site currently. No complaints noted.  Patient free of lines, drains, and wounds.   An After Visit Summary (AVS) was printed and given to the patient. Patient escorted via wheelchair, and discharged home via private auto.  Katrina Stack, RN

## 2021-05-31 ENCOUNTER — Emergency Department (HOSPITAL_COMMUNITY): Payer: No Typology Code available for payment source

## 2021-05-31 ENCOUNTER — Inpatient Hospital Stay (HOSPITAL_COMMUNITY): Payer: No Typology Code available for payment source

## 2021-05-31 ENCOUNTER — Inpatient Hospital Stay (HOSPITAL_COMMUNITY)
Admission: EM | Admit: 2021-05-31 | Discharge: 2021-06-30 | DRG: 871 | Disposition: E | Payer: No Typology Code available for payment source | Attending: Internal Medicine | Admitting: Internal Medicine

## 2021-05-31 ENCOUNTER — Encounter (HOSPITAL_COMMUNITY): Payer: Self-pay

## 2021-05-31 DIAGNOSIS — R570 Cardiogenic shock: Secondary | ICD-10-CM | POA: Diagnosis present

## 2021-05-31 DIAGNOSIS — Z95828 Presence of other vascular implants and grafts: Secondary | ICD-10-CM | POA: Diagnosis not present

## 2021-05-31 DIAGNOSIS — Z809 Family history of malignant neoplasm, unspecified: Secondary | ICD-10-CM

## 2021-05-31 DIAGNOSIS — Z20822 Contact with and (suspected) exposure to covid-19: Secondary | ICD-10-CM | POA: Diagnosis present

## 2021-05-31 DIAGNOSIS — Z8673 Personal history of transient ischemic attack (TIA), and cerebral infarction without residual deficits: Secondary | ICD-10-CM

## 2021-05-31 DIAGNOSIS — D6959 Other secondary thrombocytopenia: Secondary | ICD-10-CM | POA: Diagnosis present

## 2021-05-31 DIAGNOSIS — Z515 Encounter for palliative care: Secondary | ICD-10-CM

## 2021-05-31 DIAGNOSIS — R6521 Severe sepsis with septic shock: Secondary | ICD-10-CM | POA: Diagnosis present

## 2021-05-31 DIAGNOSIS — E876 Hypokalemia: Secondary | ICD-10-CM | POA: Diagnosis present

## 2021-05-31 DIAGNOSIS — J9602 Acute respiratory failure with hypercapnia: Secondary | ICD-10-CM | POA: Diagnosis present

## 2021-05-31 DIAGNOSIS — E7800 Pure hypercholesterolemia, unspecified: Secondary | ICD-10-CM | POA: Diagnosis present

## 2021-05-31 DIAGNOSIS — E871 Hypo-osmolality and hyponatremia: Secondary | ICD-10-CM | POA: Diagnosis present

## 2021-05-31 DIAGNOSIS — D696 Thrombocytopenia, unspecified: Secondary | ICD-10-CM

## 2021-05-31 DIAGNOSIS — A419 Sepsis, unspecified organism: Principal | ICD-10-CM | POA: Diagnosis present

## 2021-05-31 DIAGNOSIS — R579 Shock, unspecified: Secondary | ICD-10-CM

## 2021-05-31 DIAGNOSIS — K7200 Acute and subacute hepatic failure without coma: Secondary | ICD-10-CM | POA: Diagnosis present

## 2021-05-31 DIAGNOSIS — Z66 Do not resuscitate: Secondary | ICD-10-CM | POA: Diagnosis present

## 2021-05-31 DIAGNOSIS — I468 Cardiac arrest due to other underlying condition: Secondary | ICD-10-CM | POA: Diagnosis present

## 2021-05-31 DIAGNOSIS — K219 Gastro-esophageal reflux disease without esophagitis: Secondary | ICD-10-CM | POA: Diagnosis present

## 2021-05-31 DIAGNOSIS — F259 Schizoaffective disorder, unspecified: Secondary | ICD-10-CM | POA: Diagnosis present

## 2021-05-31 DIAGNOSIS — I469 Cardiac arrest, cause unspecified: Secondary | ICD-10-CM | POA: Diagnosis present

## 2021-05-31 DIAGNOSIS — R34 Anuria and oliguria: Secondary | ICD-10-CM | POA: Diagnosis present

## 2021-05-31 DIAGNOSIS — Z79899 Other long term (current) drug therapy: Secondary | ICD-10-CM

## 2021-05-31 DIAGNOSIS — M5431 Sciatica, right side: Secondary | ICD-10-CM | POA: Diagnosis present

## 2021-05-31 DIAGNOSIS — Z981 Arthrodesis status: Secondary | ICD-10-CM

## 2021-05-31 DIAGNOSIS — E872 Acidosis, unspecified: Secondary | ICD-10-CM | POA: Diagnosis present

## 2021-05-31 DIAGNOSIS — J439 Emphysema, unspecified: Secondary | ICD-10-CM | POA: Diagnosis present

## 2021-05-31 DIAGNOSIS — N170 Acute kidney failure with tubular necrosis: Secondary | ICD-10-CM | POA: Diagnosis present

## 2021-05-31 DIAGNOSIS — R739 Hyperglycemia, unspecified: Secondary | ICD-10-CM | POA: Diagnosis present

## 2021-05-31 DIAGNOSIS — Z0189 Encounter for other specified special examinations: Secondary | ICD-10-CM

## 2021-05-31 DIAGNOSIS — N400 Enlarged prostate without lower urinary tract symptoms: Secondary | ICD-10-CM | POA: Diagnosis present

## 2021-05-31 DIAGNOSIS — G8929 Other chronic pain: Secondary | ICD-10-CM | POA: Diagnosis present

## 2021-05-31 DIAGNOSIS — Z9289 Personal history of other medical treatment: Secondary | ICD-10-CM

## 2021-05-31 DIAGNOSIS — I10 Essential (primary) hypertension: Secondary | ICD-10-CM | POA: Diagnosis present

## 2021-05-31 DIAGNOSIS — Z7902 Long term (current) use of antithrombotics/antiplatelets: Secondary | ICD-10-CM

## 2021-05-31 DIAGNOSIS — J690 Pneumonitis due to inhalation of food and vomit: Secondary | ICD-10-CM | POA: Diagnosis present

## 2021-05-31 DIAGNOSIS — G931 Anoxic brain damage, not elsewhere classified: Secondary | ICD-10-CM | POA: Diagnosis present

## 2021-05-31 DIAGNOSIS — N179 Acute kidney failure, unspecified: Secondary | ICD-10-CM | POA: Diagnosis not present

## 2021-05-31 DIAGNOSIS — Z87891 Personal history of nicotine dependence: Secondary | ICD-10-CM

## 2021-05-31 DIAGNOSIS — J9601 Acute respiratory failure with hypoxia: Secondary | ICD-10-CM | POA: Diagnosis present

## 2021-05-31 LAB — I-STAT ARTERIAL BLOOD GAS, ED
Acid-base deficit: 12 mmol/L — ABNORMAL HIGH (ref 0.0–2.0)
Acid-base deficit: 6 mmol/L — ABNORMAL HIGH (ref 0.0–2.0)
Bicarbonate: 19 mmol/L — ABNORMAL LOW (ref 20.0–28.0)
Bicarbonate: 19.8 mmol/L — ABNORMAL LOW (ref 20.0–28.0)
Calcium, Ion: 1.14 mmol/L — ABNORMAL LOW (ref 1.15–1.40)
Calcium, Ion: 1.04 mmol/L — ABNORMAL LOW (ref 1.15–1.40)
HCT: 32 % — ABNORMAL LOW (ref 39.0–52.0)
HCT: 34 % — ABNORMAL LOW (ref 39.0–52.0)
Hemoglobin: 10.9 g/dL — ABNORMAL LOW (ref 13.0–17.0)
Hemoglobin: 11.6 g/dL — ABNORMAL LOW (ref 13.0–17.0)
O2 Saturation: 99 %
O2 Saturation: 100 %
Patient temperature: 95
Patient temperature: 95
Potassium: 4.4 mmol/L (ref 3.5–5.1)
Potassium: 4.1 mmol/L (ref 3.5–5.1)
Sodium: 132 mmol/L — ABNORMAL LOW (ref 135–145)
Sodium: 132 mmol/L — ABNORMAL LOW (ref 135–145)
TCO2: 21 mmol/L — ABNORMAL LOW (ref 22–32)
TCO2: 21 mmol/L — ABNORMAL LOW (ref 22–32)
pCO2 arterial: 63.4 mmHg — ABNORMAL HIGH (ref 32.0–48.0)
pCO2 arterial: 37.1 mmHg (ref 32.0–48.0)
pH, Arterial: 7.072 — CL (ref 7.350–7.450)
pH, Arterial: 7.327 — ABNORMAL LOW (ref 7.350–7.450)
pO2, Arterial: 182 mmHg — ABNORMAL HIGH (ref 83.0–108.0)
pO2, Arterial: 204 mmHg — ABNORMAL HIGH (ref 83.0–108.0)

## 2021-05-31 LAB — GLUCOSE, CAPILLARY: Glucose-Capillary: 301 mg/dL — ABNORMAL HIGH (ref 70–99)

## 2021-05-31 LAB — BASIC METABOLIC PANEL
Anion gap: 15 (ref 5–15)
BUN: 14 mg/dL (ref 8–23)
CO2: 19 mmol/L — ABNORMAL LOW (ref 22–32)
Calcium: 7.9 mg/dL — ABNORMAL LOW (ref 8.9–10.3)
Chloride: 97 mmol/L — ABNORMAL LOW (ref 98–111)
Creatinine, Ser: 1.75 mg/dL — ABNORMAL HIGH (ref 0.61–1.24)
GFR, Estimated: 38 mL/min — ABNORMAL LOW (ref >60)
Glucose, Bld: 276 mg/dL — ABNORMAL HIGH (ref 70–99)
Potassium: 3.9 mmol/L (ref 3.5–5.1)
Sodium: 131 mmol/L — ABNORMAL LOW (ref 135–145)

## 2021-05-31 LAB — CBC
HCT: 35.3 % — ABNORMAL LOW (ref 39.0–52.0)
Hemoglobin: 10.5 g/dL — ABNORMAL LOW (ref 13.0–17.0)
MCH: 28.1 pg (ref 26.0–34.0)
MCHC: 29.7 g/dL — ABNORMAL LOW (ref 30.0–36.0)
MCV: 94.4 fL (ref 80.0–100.0)
Platelets: 97 10*3/uL — ABNORMAL LOW (ref 150–400)
RBC: 3.74 MIL/uL — ABNORMAL LOW (ref 4.22–5.81)
RDW: 15.9 % — ABNORMAL HIGH (ref 11.5–15.5)
WBC: 10.2 10*3/uL (ref 4.0–10.5)
nRBC: 0.4 % — ABNORMAL HIGH (ref 0.0–0.2)

## 2021-05-31 LAB — I-STAT CHEM 8, ED
BUN: 15 mg/dL (ref 8–23)
Calcium, Ion: 1.06 mmol/L — ABNORMAL LOW (ref 1.15–1.40)
Chloride: 99 mmol/L (ref 98–111)
Creatinine, Ser: 1.6 mg/dL — ABNORMAL HIGH (ref 0.61–1.24)
Glucose, Bld: 254 mg/dL — ABNORMAL HIGH (ref 70–99)
HCT: 34 % — ABNORMAL LOW (ref 39.0–52.0)
Hemoglobin: 11.6 g/dL — ABNORMAL LOW (ref 13.0–17.0)
Potassium: 4.1 mmol/L (ref 3.5–5.1)
Sodium: 134 mmol/L — ABNORMAL LOW (ref 135–145)
TCO2: 21 mmol/L — ABNORMAL LOW (ref 22–32)

## 2021-05-31 LAB — RESP PANEL BY RT-PCR (FLU A&B, COVID) ARPGX2
Influenza A by PCR: NEGATIVE
Influenza B by PCR: NEGATIVE
SARS Coronavirus 2 by RT PCR: NEGATIVE

## 2021-05-31 LAB — HEPATIC FUNCTION PANEL
ALT: 42 U/L (ref 0–44)
AST: 66 U/L — ABNORMAL HIGH (ref 15–41)
Albumin: 2.5 g/dL — ABNORMAL LOW (ref 3.5–5.0)
Alkaline Phosphatase: 153 U/L — ABNORMAL HIGH (ref 38–126)
Bilirubin, Direct: 0.2 mg/dL (ref 0.0–0.2)
Indirect Bilirubin: 0.5 mg/dL (ref 0.3–0.9)
Total Bilirubin: 0.7 mg/dL (ref 0.3–1.2)
Total Protein: 5.1 g/dL — ABNORMAL LOW (ref 6.5–8.1)

## 2021-05-31 LAB — LACTIC ACID, PLASMA: Lactic Acid, Venous: >9 mmol/L (ref 0.5–1.9)

## 2021-05-31 LAB — TROPONIN I (HIGH SENSITIVITY)
Troponin I (High Sensitivity): 70 ng/L — ABNORMAL HIGH (ref <18)
Troponin I (High Sensitivity): 53 ng/L — ABNORMAL HIGH (ref <18)

## 2021-05-31 LAB — PROTIME-INR
INR: 1.5 — ABNORMAL HIGH (ref 0.8–1.2)
Prothrombin Time: 18 seconds — ABNORMAL HIGH (ref 11.4–15.2)

## 2021-05-31 LAB — ETHANOL: Alcohol, Ethyl (B): <10 mg/dL (ref <10)

## 2021-05-31 MED ORDER — ACETAMINOPHEN 325 MG PO TABS: 650.0000 mg | ORAL_TABLET | ORAL | Status: DC | PRN

## 2021-05-31 MED ORDER — CHLORHEXIDINE GLUCONATE CLOTH 2 % EX PADS: 6.0000 | MEDICATED_PAD | Freq: Every day | CUTANEOUS | Status: DC

## 2021-05-31 MED ORDER — POLYETHYLENE GLYCOL 3350 17 G PO PACK
17.0000 g | PACK | Freq: Every day | ORAL | Status: DC
  Administered 2021-05-31: 17 g
  Filled 2021-05-31: qty 1

## 2021-05-31 MED ORDER — ONDANSETRON HCL 4 MG/2ML IJ SOLN: 4.0000 mg | Freq: Four times a day (QID) | INTRAMUSCULAR | Status: DC | PRN

## 2021-05-31 MED ORDER — SODIUM BICARBONATE 8.4 % IV SOLN
INTRAVENOUS | Status: AC | PRN
  Administered 2021-05-31: 50 meq via INTRAVENOUS

## 2021-05-31 MED ORDER — DOCUSATE SODIUM 50 MG/5ML PO LIQD
100.0000 mg | Freq: Two times a day (BID) | ORAL | Status: DC
  Administered 2021-05-31: 100 mg
  Filled 2021-05-31: qty 10

## 2021-05-31 MED ORDER — ACETAMINOPHEN 650 MG RE SUPP: 650.0000 mg | RECTAL | Status: DC | PRN

## 2021-05-31 MED ORDER — EPINEPHRINE 1 MG/10ML IJ SOSY
PREFILLED_SYRINGE | INTRAMUSCULAR | Status: AC | PRN
  Administered 2021-05-31 (×2): 1 mg via INTRAVENOUS
  Administered 2021-05-31: .5 mg via INTRAVENOUS

## 2021-05-31 MED ORDER — HEPARIN SODIUM (PORCINE) 5000 UNIT/ML IJ SOLN
5000.0000 [IU] | Freq: Three times a day (TID) | INTRAMUSCULAR | Status: DC
  Administered 2021-05-31 – 2021-06-02 (×5): 5000 [IU] via SUBCUTANEOUS
  Filled 2021-05-31 (×5): qty 1

## 2021-05-31 MED ORDER — SODIUM CHLORIDE 0.9 % IV SOLN: INTRAVENOUS | Status: DC | PRN

## 2021-05-31 MED ORDER — ORAL CARE MOUTH RINSE
15.0000 mL | OROMUCOSAL | Status: DC
  Administered 2021-06-01 – 2021-06-02 (×15): 15 mL via OROMUCOSAL

## 2021-05-31 MED ORDER — PANTOPRAZOLE SODIUM 40 MG IV SOLR
40.0000 mg | Freq: Every day | INTRAVENOUS | Status: DC
  Administered 2021-05-31 – 2021-06-01 (×2): 40 mg via INTRAVENOUS
  Filled 2021-05-31 (×2): qty 40

## 2021-05-31 MED ORDER — EPINEPHRINE HCL 5 MG/250ML IV SOLN IN NS
0.5000 ug/min | INTRAVENOUS | Status: DC
  Administered 2021-06-01 (×2): 11 ug/min via INTRAVENOUS
  Administered 2021-06-01: 8 ug/min via INTRAVENOUS
  Administered 2021-06-02: 9 ug/min via INTRAVENOUS
  Filled 2021-05-31 (×6): qty 250

## 2021-05-31 MED ORDER — ACETAMINOPHEN 160 MG/5ML PO SOLN: 650.0000 mg | ORAL | Status: DC | PRN

## 2021-05-31 MED ORDER — EPINEPHRINE HCL 5 MG/250ML IV SOLN IN NS
INTRAVENOUS | Status: AC
  Administered 2021-05-31: 10 ug/min via INTRAVENOUS
  Filled 2021-05-31: qty 250

## 2021-05-31 MED ORDER — CHLORHEXIDINE GLUCONATE 0.12% ORAL RINSE (MEDLINE KIT)
15.0000 mL | Freq: Two times a day (BID) | OROMUCOSAL | Status: DC
  Administered 2021-05-31 – 2021-06-02 (×4): 15 mL via OROMUCOSAL

## 2021-05-31 MED ORDER — SODIUM CHLORIDE 0.9% FLUSH
10.0000 mL | Freq: Two times a day (BID) | INTRAVENOUS | Status: DC
  Administered 2021-05-31 – 2021-06-02 (×3): 10 mL

## 2021-05-31 MED ORDER — SODIUM CHLORIDE 0.9 % IV SOLN: 250.0000 mL | INTRAVENOUS | Status: DC

## 2021-05-31 MED ORDER — NOREPINEPHRINE 4 MG/250ML-% IV SOLN
0.0000 ug/min | INTRAVENOUS | Status: DC
  Administered 2021-05-31: 30 ug/min via INTRAVENOUS
  Administered 2021-06-01: 35 ug/min via INTRAVENOUS
  Filled 2021-05-31 (×2): qty 250

## 2021-05-31 MED ORDER — FENTANYL CITRATE PF 50 MCG/ML IJ SOSY
25.0000 ug | PREFILLED_SYRINGE | INTRAMUSCULAR | Status: DC | PRN
  Administered 2021-06-02: 100 ug via INTRAVENOUS
  Filled 2021-05-31: qty 2

## 2021-05-31 MED ORDER — IPRATROPIUM-ALBUTEROL 0.5-2.5 (3) MG/3ML IN SOLN: 3.0000 mL | RESPIRATORY_TRACT | Status: DC | PRN

## 2021-05-31 MED ORDER — IPRATROPIUM-ALBUTEROL 0.5-2.5 (3) MG/3ML IN SOLN
3.0000 mL | Freq: Four times a day (QID) | RESPIRATORY_TRACT | Status: DC
  Administered 2021-05-31 – 2021-06-02 (×7): 3 mL via RESPIRATORY_TRACT
  Filled 2021-05-31 (×7): qty 3

## 2021-05-31 MED ORDER — EPINEPHRINE 1 MG/10ML IJ SOSY
PREFILLED_SYRINGE | INTRAMUSCULAR | Status: AC | PRN
  Administered 2021-05-31: .5 mg via INTRAVENOUS

## 2021-05-31 MED ORDER — CHLORHEXIDINE GLUCONATE CLOTH 2 % EX PADS
6.0000 | MEDICATED_PAD | Freq: Every day | CUTANEOUS | Status: DC
  Administered 2021-05-31 – 2021-06-02 (×2): 6 via TOPICAL

## 2021-05-31 MED ORDER — FENTANYL CITRATE PF 50 MCG/ML IJ SOSY: 25.0000 ug | PREFILLED_SYRINGE | INTRAMUSCULAR | Status: DC | PRN

## 2021-05-31 MED ORDER — SODIUM CHLORIDE 0.9% FLUSH: 10.0000 mL | INTRAVENOUS | Status: DC | PRN

## 2021-05-31 NOTE — Progress Notes (Signed)
Pt transported from ED to CT to 2H 23 without event.

## 2021-05-31 NOTE — ED Notes (Signed)
Family and CCM at bedside

## 2021-05-31 NOTE — Procedures (Signed)
Central Venous Catheter Insertion Procedure Note  Norman Davis  [id number]  January 13, 1941  Date:05/13/2021  Time:10:28 PM   Provider Performing:Chayden Garrelts Viona Gilmore [place]   Procedure: Insertion of Non-tunneled Central Venous [phone]) with US guidance [id number])   Indication(s) Medication administration  Consent Risks of the procedure as well as the alternatives and risks of each were explained to the patient and/or caregiver.  Consent for the procedure was obtained and is signed in the bedside chart  Anesthesia Topical only with 1% lidocaine   Timeout Verified patient identification, verified procedure, site/side was marked, verified correct patient position, special equipment/implants available, medications/allergies/relevant history reviewed, required imaging and test results available.  Sterile Technique Maximal sterile technique including full sterile barrier drape, hand hygiene, sterile gown, sterile gloves, mask, hair covering, sterile ultrasound probe cover (if used).  Procedure Description Area of catheter insertion was cleaned with chlorhexidine and draped in sterile fashion.  With real-time ultrasound guidance a central venous catheter was placed into the right femoral vein. Nonpulsatile blood flow and easy flushing noted in all ports.  The catheter was sutured in place and sterile dressing applied.  Complications/Tolerance None; patient tolerated the procedure well. Chest X-ray is ordered to verify placement for internal jugular or subclavian cannulation.   Chest x-ray is not ordered for femoral cannulation.  EBL Minimal  Specimen(s) None     Georgann Housekeeper, AGACNP-BC [facility]  See Amion for personal pager PCCM on call pager [phone] until 7pm. Please call Elink 7p-7a. [phone]  05/14/2021 10:29 PM

## 2021-05-31 NOTE — Progress Notes (Signed)
Protocol consult for US guided IV due to vasopressor order. Pt currently has a central line. Consult cleared.

## 2021-05-31 NOTE — H&P (Addendum)
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 Who was finishing dinner then lost consciousness suddenly.  According to his sister who lives with him and helps care for him, he was finishing diner when she stepped out of the kitchen.  She then heard him fall to the floor and found him unresponsive.  She was unable to turn him over to start cpr, and immediately called 911.  He was found to be in PEA per ED, king airway was placed.  Per verbal report from ED physician, patient underwent CPR by EMS, would intermittently achieve ROSC and then develop PEA again, total of about 45 min of arrest/rosc period, unk exact amount of time coding.  Was transcutaneously paced for a time as well.    In ED, king airway was removed, patient was intubated without paralytic or sedation.  Food debris seen in the airway.  PEA arrest developed shortly after, Rosc achieved.   Again developed PEA several minutes later.  Is now on levophed gtt and epi infusion and BP  and HR are stable.    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  Spinal stenosis (recently admitted, not surgical candidate, treated with steroids).   Cholelithiasis GERD  Schizoaffective d/o (vs  schizophrenia per chart) Hx SBO   Takes plavix unk reason Meds:  albuterol, tylenol, plavix, norvasc, doxazosin, finasteride, fluoxetine, fluticasone, losartan, melatonin, remeron, folic acid, norco, mirabegron, pravastatin, spiriva.  Significant Hospital Events: Including procedures,  antibiotic start and stop dates in addition to other pertinent events     Interim History / Subjective:    Objective   Blood pressure 95/76, pulse 91, temperature (!) 95 F (35 C), temperature source Temporal, resp. rate (!) 30, SpO2 100 %.    Vent Mode: PRVC FiO2 (%):  [100 %] 100 % Set Rate:  [24 bmp] 24 bmp Vt Set:  [560 mL] 560 mL PEEP:  [5 cmH20] 5 cmH20 Plateau Pressure:  [16 cmH20] 16 cmH20  No intake or output data in the 24 hours ending 06-05-2021 2149 There were no vitals filed for this visit.  Examination: General: Intubated, non responsive to pain.  HENT: normocephalic, Pupils non reactive  Lungs: CTAB  Cardiovascular: RRR no mgr  Abdomen: nt, nd, nbs  Extremities: no edema no erythema Neuro: non responsive, non responsive to pain  no cough GU:   Bedside echo does not reveal any wall motion abnormalities, tamponade or RV dysfunction.   Resolved Hospital Problem list     Assessment & Plan:  S/p cardiac arrest, hypotension, acidosis:  Most likely 2/2 hypoxia due to choking, given history and findings on intubation.  R/u other causes - echo pending, CT head.   No symptoms obvious to sister during the day prior to arrest.  Apparent prolonged arrest.   Continue supportive care at this time, including mechanical ventilation.  Repeat ABG and adjust vent as needed. Normothermia protocol.   EEG.  Lactic acid pending.  Cont pressors prn, map goal 65.   COPD: on spiriva per chart.  Duonebs while intubated.   Schizoaffective d/o: on fluoxetine. Remeron  GERD: protonix HTN: hold meds HLD; statin BPH: on finasteride, mirabegron  Takes plavix: unk reason.   Hx of stroke by prior CT head.  Hold for now.      Best Practice (right click and "Reselect all SmartList Selections" daily)   Diet/type: NPO DVT prophylaxis: other start heparin tomorrow  GI prophylaxis: PPI Lines: Central line Foley:  N/A Code Status:  DNR Last date of multidisciplinary goals of  care discussion []   Labs   CBC: Recent Labs  Lab 06/24/21 2026 Jun 24, 2021 2045 June 24, 2021 2050 2021-06-24 2144  WBC 10.2  --   --   --   HGB 10.5* 11.6* 10.9* 11.6*  HCT 35.3* 34.0* 32.0* 34.0*  MCV 94.4  --   --   --   PLT 97*  --   --   --     Basic Metabolic Panel: Recent Labs  Lab 06-24-2021 2026 June 24, 2021 2045 06/24/2021 2050 06-24-21 2144  NA 131* 134* 132* 132*  K 3.9 4.1 4.4 4.1  CL 97* 99  --   --   CO2 19*  --   --   --   GLUCOSE 276* 254*  --   --   BUN 14 15  --   --   CREATININE 1.75* 1.60*  --   --   CALCIUM 7.9*  --   --   --    GFR: CrCl cannot be calculated (Unknown ideal weight.). Recent Labs  Lab June 24, 2021 2026  WBC 10.2    Liver Function Tests: Recent Labs  Lab 06/24/21 2050  AST 66*  ALT 42  ALKPHOS 153*  BILITOT 0.7  PROT 5.1*  ALBUMIN 2.5*   No results for input(s): LIPASE, AMYLASE in the last 168 hours. No results for input(s): AMMONIA in the last 168 hours.  ABG    Component Value Date/Time   PHART 7.327 (L) Jun 24, 2021 2144   PCO2ART 37.1 June 24, 2021 2144   PO2ART 204 (H) 06-24-21 2144   HCO3 19.8 (L) 06-24-2021 2144   TCO2 21 (L) 24-Jun-2021 2144   ACIDBASEDEF 6.0 (H) 2021/06/24 2144   O2SAT 100.0 06-24-21 2144     Coagulation Profile: No results for input(s): INR, PROTIME in the last 168 hours.  Cardiac Enzymes: No results for input(s): CKTOTAL, CKMB, CKMBINDEX, TROPONINI in the last 168 hours.  HbA1C: Hgb A1c MFr Bld  Date/Time Value Ref Range Status  05/24/2017 03:07 AM 5.3 4.8 - 5.6 % Final    Comment:    (NOTE) Pre diabetes:          5.7%-6.4% Diabetes:              >6.4% Glycemic control for   <7.0% adults with diabetes   04/29/2015 04:19 AM 5.8 (H) 4.8 - 5.6 % Final    Comment:    (NOTE)         Pre-diabetes: 5.7 - 6.4         Diabetes: >6.4         Glycemic control for adults with diabetes: <7.0     CBG: No results for input(s): GLUCAP in the last 168 hours.  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  Surgical History:   Past Surgical History:  Procedure Laterality Date   BACK SURGERY     IR LUMBAR DISC ASPIRATION W/IMG GUIDE  04/13/2017   LIVER SURGERY     PELVIC FRACTURE SURGERY     TOTAL HIP ARTHROPLASTY       Social History:   reports that he quit smoking about 21 years ago. His smoking use included cigarettes. He smoked an average of 1 pack per day. He has never used smokeless tobacco. He reports that he does not drink alcohol and does not use drugs.   Family History:  His family history includes Cancer in his father and mother.   Allergies Allergies  Allergen Reactions   Gabapentin     Other reaction(s): Delirium   Penicillins     Unknown Has patient had a PCN reaction causing immediate rash, facial/tongue/throat swelling, SOB or lightheadedness with hypotension: NO Has patient had a PCN reaction causing severe rash involving mucus membranes or skin necrosis: NO Has patient had a PCN reaction that required hospitalization NO Has patient had a PCN reaction occurring within the last 10 years: NO If all of the above answers are "NO", then may proceed with Cephalosporin use.   Tramadol     Other reaction(s): Delirium   Aspirin Rash     Home Medications  Prior to Admission medications   Medication Sig Start Date End Date Taking? Authorizing Provider  acetaminophen (TYLENOL) 500 MG tablet Take 500 mg by mouth daily.    [provider]  albuterol (PROVENTIL HFA;VENTOLIN HFA) 108 (90 BASE) MCG/ACT inhaler Inhale 2 puffs into the lungs every 4 (four) hours as needed for wheezing or shortness of breath.    [provider]  amLODipine (NORVASC) 5 MG tablet Take 2 tablets (10 mg total) by mouth daily. 05/10/21   Osvaldo ShipperKrishnan, Gokul, MD  atorvastatin (LIPITOR) 40 MG tablet Take 1 tablet (40 mg total) by mouth daily at 6 PM. 05/10/21   Osvaldo ShipperKrishnan, Gokul, MD  clopidogrel (PLAVIX) 75 MG tablet Take 75 mg by mouth daily with breakfast.    [provider]  dexamethasone (DECADRON) 2 MG tablet Take 2 tablets twice daily for 7 days, then take 2 tablets once daily for 1 week, then take 1 tablet once daily for 1 week, then STOP 05/10/21   Osvaldo ShipperKrishnan, Gokul, MD  dorzolamidel-timolol (COSOPT) 22.3-6.8 MG/ML SOLN ophthalmic solution Place 1 drop into both eyes 2 (two) times daily.    [provider]  doxazosin (CARDURA) 8 MG tablet Take 4 mg by mouth at bedtime.    [provider]  finasteride (PROSCAR) 5 MG tablet Take 5 mg by mouth daily.    [provider]  FLUoxetine (PROZAC) 20 MG capsule Take 40 mg by mouth daily.    [provider]  fluticasone (FLONASE) 50 MCG/ACT nasal spray Place 2 sprays into both nostrils daily.    [provider]  hydrochlorothiazide (HYDRODIURIL) 25 MG tablet Take 25 mg by mouth daily.    [provider]  losartan (COZAAR) 100 MG tablet Take 100 mg by mouth at bedtime.    [provider]  meloxicam (MOBIC) 15 MG tablet Take 15 mg by mouth daily.    [provider]  methocarbamol (ROBAXIN) 500 MG tablet Take 1 tablet (500 mg total) by mouth every 8 (eight) hours as needed for muscle spasms. 05/10/21   Osvaldo ShipperKrishnan, Gokul, MD  mirtazapine (REMERON) 30 MG tablet Take 30 mg by mouth at bedtime.  [provider]  oxyCODONE (OXY IR/ROXICODONE) 5 MG immediate release tablet Take 1 tablet (5 mg total) by mouth every 6 (six) hours as needed for severe pain. 05/10/21   Osvaldo ShipperKrishnan, Gokul, MD  paliperidone (INVEGA) 3 MG 24 hr tablet Take 3 mg by mouth daily.    [provider]  pantoprazole (PROTONIX) 40 MG tablet Take 40 mg by mouth  2 (two) times daily.    [provider]  polyethylene glycol (MIRALAX / GLYCOLAX) packet Take 17 g by mouth daily.    [provider]  potassium chloride (KLOR-CON) 20 MEQ packet Take 20 mEq by mouth every other day.    [provider]  pregabalin (LYRICA) 25 MG capsule Take 25 mg by mouth at bedtime.    [provider]  senna (SENOKOT) 8.6 MG TABS tablet Take 2 tablets by mouth every other day.    [provider]  Vitamin D, Ergocalciferol, (DRISDOL) 1.25 MG (50000 UNIT) CAPS capsule Take 50,000 Units by mouth every 7 (seven) days. Wednesdays    [provider]     Critical care time: 45 minutes

## 2021-05-31 NOTE — Code Documentation (Signed)
Pulse check, pulses present

## 2021-05-31 NOTE — Code Documentation (Signed)
Pulses lost, PEA arrest, CPR started

## 2021-05-31 NOTE — Code Documentation (Signed)
PEA.

## 2021-05-31 NOTE — Procedures (Signed)
Arterial Catheter Insertion Procedure Note  Norman Davis  SO:[id number]  March 16, 1941  Date:05/04/2021  Time:9:51 PM    Provider Performing: Wynelle Beckmann    Procedure: Insertion of Arterial Line [phone]) without US guidance  Indication(s) Blood pressure monitoring and/or need for frequent ABGs  Consent Unable to obtain consent due to emergent nature of procedure.  Anesthesia None   Time Out Verified patient identification, verified procedure, site/side was marked, verified correct patient position, special equipment/implants available, medications/allergies/relevant history reviewed, required imaging and test results available.   Sterile Technique Maximal sterile technique including full sterile barrier drape, hand hygiene, sterile gown, sterile gloves, mask, hair covering, sterile ultrasound probe cover (if used).   Procedure Description Area of catheter insertion was cleaned with chlorhexidine and draped in sterile fashion. Without real-time ultrasound guidance an arterial catheter was placed into the left radial artery.  Appropriate arterial tracings confirmed on monitor.     Complications/Tolerance None; patient tolerated the procedure well.   EBL Minimal   Specimen(s) None

## 2021-05-31 NOTE — Code Documentation (Addendum)
Pt comes via [facility], witnessed arrest at home, family hear pt collapse and then started CPR at 1851, pt was in PEA upon EMS arrival, pt received appx 45-60 minutes of CPR total and then had a rate of 30 and began pacing, 8 epi pushes and epi drip started, given 1500 of fluid, king airway in place

## 2021-06-01 ENCOUNTER — Inpatient Hospital Stay (HOSPITAL_COMMUNITY): Payer: No Typology Code available for payment source

## 2021-06-01 DIAGNOSIS — J9601 Acute respiratory failure with hypoxia: Secondary | ICD-10-CM

## 2021-06-01 DIAGNOSIS — R6521 Severe sepsis with septic shock: Secondary | ICD-10-CM

## 2021-06-01 DIAGNOSIS — I469 Cardiac arrest, cause unspecified: Secondary | ICD-10-CM | POA: Diagnosis not present

## 2021-06-01 DIAGNOSIS — N179 Acute kidney failure, unspecified: Secondary | ICD-10-CM

## 2021-06-01 DIAGNOSIS — A419 Sepsis, unspecified organism: Principal | ICD-10-CM

## 2021-06-01 DIAGNOSIS — J9602 Acute respiratory failure with hypercapnia: Secondary | ICD-10-CM

## 2021-06-01 LAB — CBC
HCT: 34.7 % — ABNORMAL LOW (ref 39.0–52.0)
Hemoglobin: 11.6 g/dL — ABNORMAL LOW (ref 13.0–17.0)
MCH: 28.8 pg (ref 26.0–34.0)
MCHC: 33.4 g/dL (ref 30.0–36.0)
MCV: 86.1 fL (ref 80.0–100.0)
Platelets: 91 10*3/uL — ABNORMAL LOW (ref 150–400)
RBC: 4.03 MIL/uL — ABNORMAL LOW (ref 4.22–5.81)
RDW: 16.1 % — ABNORMAL HIGH (ref 11.5–15.5)
WBC: 17.8 10*3/uL — ABNORMAL HIGH (ref 4.0–10.5)
nRBC: 0 % (ref 0.0–0.2)

## 2021-06-01 LAB — ECHOCARDIOGRAM COMPLETE
AR max vel: 2.67 cm2
AV Area VTI: 2.63 cm2
AV Area mean vel: 2.56 cm2
AV Mean grad: 3 mmHg
AV Peak grad: 5.6 mmHg
Ao pk vel: 1.18 m/s
Area-P 1/2: 3.12 cm2
Weight: 3040.58 oz

## 2021-06-01 LAB — POCT I-STAT 7, (LYTES, BLD GAS, ICA,H+H)
Acid-Base Excess: 2 mmol/L (ref 0.0–2.0)
Acid-base deficit: 11 mmol/L — ABNORMAL HIGH (ref 0.0–2.0)
Acid-base deficit: 10 mmol/L — ABNORMAL HIGH (ref 0.0–2.0)
Bicarbonate: 12.6 mmol/L — ABNORMAL LOW (ref 20.0–28.0)
Bicarbonate: 14.5 mmol/L — ABNORMAL LOW (ref 20.0–28.0)
Bicarbonate: 25.4 mmol/L (ref 20.0–28.0)
Calcium, Ion: 1.02 mmol/L — ABNORMAL LOW (ref 1.15–1.40)
Calcium, Ion: 1.1 mmol/L — ABNORMAL LOW (ref 1.15–1.40)
Calcium, Ion: 1 mmol/L — ABNORMAL LOW (ref 1.15–1.40)
HCT: 37 % — ABNORMAL LOW (ref 39.0–52.0)
HCT: 34 % — ABNORMAL LOW (ref 39.0–52.0)
HCT: 33 % — ABNORMAL LOW (ref 39.0–52.0)
Hemoglobin: 12.6 g/dL — ABNORMAL LOW (ref 13.0–17.0)
Hemoglobin: 11.6 g/dL — ABNORMAL LOW (ref 13.0–17.0)
Hemoglobin: 11.2 g/dL — ABNORMAL LOW (ref 13.0–17.0)
O2 Saturation: 100 %
O2 Saturation: 100 %
O2 Saturation: 99 %
Patient temperature: 32.9
Patient temperature: 33
Patient temperature: 36.7
Potassium: 3.5 mmol/L (ref 3.5–5.1)
Potassium: 2.9 mmol/L — ABNORMAL LOW (ref 3.5–5.1)
Potassium: 4 mmol/L (ref 3.5–5.1)
Sodium: 132 mmol/L — ABNORMAL LOW (ref 135–145)
Sodium: 136 mmol/L (ref 135–145)
Sodium: 136 mmol/L (ref 135–145)
TCO2: 13 mmol/L — ABNORMAL LOW (ref 22–32)
TCO2: 15 mmol/L — ABNORMAL LOW (ref 22–32)
TCO2: 26 mmol/L (ref 22–32)
pCO2 arterial: 18 mmHg — CL (ref 32.0–48.0)
pCO2 arterial: 23.8 mmHg — ABNORMAL LOW (ref 32.0–48.0)
pCO2 arterial: 32.2 mmHg (ref 32.0–48.0)
pH, Arterial: 7.435 (ref 7.350–7.450)
pH, Arterial: 7.375 (ref 7.350–7.450)
pH, Arterial: 7.504 — ABNORMAL HIGH (ref 7.350–7.450)
pO2, Arterial: 200 mmHg — ABNORMAL HIGH (ref 83.0–108.0)
pO2, Arterial: 156 mmHg — ABNORMAL HIGH (ref 83.0–108.0)
pO2, Arterial: 147 mmHg — ABNORMAL HIGH (ref 83.0–108.0)

## 2021-06-01 LAB — URINALYSIS, ROUTINE W REFLEX MICROSCOPIC
Bilirubin Urine: NEGATIVE
Glucose, UA: NEGATIVE mg/dL
Hgb urine dipstick: NEGATIVE
Ketones, ur: NEGATIVE mg/dL
Leukocytes,Ua: NEGATIVE
Nitrite: NEGATIVE
Protein, ur: NEGATIVE mg/dL
Specific Gravity, Urine: 1.01 (ref 1.005–1.030)
pH: 7 (ref 5.0–8.0)

## 2021-06-01 LAB — BASIC METABOLIC PANEL
Anion gap: 21 — ABNORMAL HIGH (ref 5–15)
Anion gap: 11 (ref 5–15)
BUN: 24 mg/dL — ABNORMAL HIGH (ref 8–23)
BUN: 30 mg/dL — ABNORMAL HIGH (ref 8–23)
CO2: 14 mmol/L — ABNORMAL LOW (ref 22–32)
CO2: 23 mmol/L (ref 22–32)
Calcium: 8.1 mg/dL — ABNORMAL LOW (ref 8.9–10.3)
Calcium: 7.8 mg/dL — ABNORMAL LOW (ref 8.9–10.3)
Chloride: 100 mmol/L (ref 98–111)
Chloride: 101 mmol/L (ref 98–111)
Creatinine, Ser: 2.28 mg/dL — ABNORMAL HIGH (ref 0.61–1.24)
Creatinine, Ser: 2.62 mg/dL — ABNORMAL HIGH (ref 0.61–1.24)
GFR, Estimated: 28 mL/min — ABNORMAL LOW (ref >60)
GFR, Estimated: 24 mL/min — ABNORMAL LOW (ref >60)
Glucose, Bld: 232 mg/dL — ABNORMAL HIGH (ref 70–99)
Glucose, Bld: 96 mg/dL (ref 70–99)
Potassium: 2.9 mmol/L — ABNORMAL LOW (ref 3.5–5.1)
Potassium: 3.6 mmol/L (ref 3.5–5.1)
Sodium: 135 mmol/L (ref 135–145)
Sodium: 135 mmol/L (ref 135–145)

## 2021-06-01 LAB — GLUCOSE, CAPILLARY
Glucose-Capillary: 143 mg/dL — ABNORMAL HIGH (ref 70–99)
Glucose-Capillary: 198 mg/dL — ABNORMAL HIGH (ref 70–99)
Glucose-Capillary: 280 mg/dL — ABNORMAL HIGH (ref 70–99)
Glucose-Capillary: 349 mg/dL — ABNORMAL HIGH (ref 70–99)
Glucose-Capillary: 77 mg/dL (ref 70–99)
Glucose-Capillary: 86 mg/dL (ref 70–99)
Glucose-Capillary: 94 mg/dL (ref 70–99)

## 2021-06-01 LAB — COOXEMETRY PANEL
Carboxyhemoglobin: 1 % (ref 0.5–1.5)
Methemoglobin: 0.8 % (ref 0.0–1.5)
O2 Saturation: 84.6 %
Total hemoglobin: 11.4 g/dL — ABNORMAL LOW (ref 12.0–16.0)

## 2021-06-01 LAB — PHOSPHORUS: Phosphorus: 3.8 mg/dL (ref 2.5–4.6)

## 2021-06-01 LAB — HEMOGLOBIN A1C
Hgb A1c MFr Bld: 5.4 % (ref 4.8–5.6)
Mean Plasma Glucose: 108.28 mg/dL

## 2021-06-01 LAB — MRSA NEXT GEN BY PCR, NASAL: MRSA by PCR Next Gen: NOT DETECTED

## 2021-06-01 LAB — LACTIC ACID, PLASMA
Lactic Acid, Venous: >9 mmol/L (ref 0.5–1.9)
Lactic Acid, Venous: >9 mmol/L (ref 0.5–1.9)

## 2021-06-01 LAB — MAGNESIUM
Magnesium: 1.3 mg/dL — ABNORMAL LOW (ref 1.7–2.4)
Magnesium: 1.7 mg/dL (ref 1.7–2.4)

## 2021-06-01 MED ORDER — STERILE WATER FOR INJECTION IV SOLN
INTRAVENOUS | Status: DC
  Filled 2021-06-01 (×4): qty 1000

## 2021-06-01 MED ORDER — VASOPRESSIN 20 UNITS/100 ML INFUSION FOR SHOCK
0.0000 [IU]/min | INTRAVENOUS | Status: DC
  Administered 2021-06-01 – 2021-06-02 (×3): 0.03 [IU]/min via INTRAVENOUS
  Filled 2021-06-01 (×4): qty 100

## 2021-06-01 MED ORDER — POTASSIUM CHLORIDE 10 MEQ/50ML IV SOLN
10.0000 meq | INTRAVENOUS | Status: AC
  Administered 2021-06-01 (×4): 10 meq via INTRAVENOUS
  Filled 2021-06-01 (×4): qty 50

## 2021-06-01 MED ORDER — NOREPINEPHRINE 16 MG/250ML-% IV SOLN
0.0000 ug/min | INTRAVENOUS | Status: DC
  Administered 2021-06-01: 35 ug/min via INTRAVENOUS
  Administered 2021-06-01: 46 ug/min via INTRAVENOUS
  Administered 2021-06-01: 48 ug/min via INTRAVENOUS
  Administered 2021-06-01: 57 ug/min via INTRAVENOUS
  Administered 2021-06-02: 43 ug/min via INTRAVENOUS
  Administered 2021-06-02: 41 ug/min via INTRAVENOUS
  Filled 2021-06-01 (×5): qty 250
  Filled 2021-06-01: qty 500
  Filled 2021-06-01: qty 250

## 2021-06-01 MED ORDER — "THROMBI-PAD 3""X3"" EX PADS"
1.0000 | MEDICATED_PAD | Freq: Once | CUTANEOUS | Status: AC
  Administered 2021-06-01: 1 via TOPICAL
  Filled 2021-06-01: qty 1

## 2021-06-01 MED ORDER — PERFLUTREN LIPID MICROSPHERE
1.0000 mL | INTRAVENOUS | Status: AC | PRN
  Administered 2021-06-01: 4 mL via INTRAVENOUS
  Filled 2021-06-01: qty 10

## 2021-06-01 MED ORDER — MAGNESIUM SULFATE 2 GM/50ML IV SOLN
2.0000 g | Freq: Once | INTRAVENOUS | Status: AC
  Administered 2021-06-01: 2 g via INTRAVENOUS
  Filled 2021-06-01: qty 50

## 2021-06-01 MED ORDER — INSULIN ASPART 100 UNIT/ML IJ SOLN
0.0000 [IU] | INTRAMUSCULAR | Status: DC
  Administered 2021-06-01: 11 [IU] via SUBCUTANEOUS
  Administered 2021-06-01: 3 [IU] via SUBCUTANEOUS
  Administered 2021-06-01: 8 [IU] via SUBCUTANEOUS
  Administered 2021-06-01: 2 [IU] via SUBCUTANEOUS

## 2021-06-01 MED ORDER — ALBUMIN HUMAN 25 % IV SOLN
25.0000 g | Freq: Once | INTRAVENOUS | Status: AC
  Administered 2021-06-01: 25 g via INTRAVENOUS
  Filled 2021-06-01: qty 100

## 2021-06-01 MED ORDER — CALCIUM GLUCONATE-NACL 2-0.675 GM/100ML-% IV SOLN
2.0000 g | Freq: Once | INTRAVENOUS | Status: AC
  Administered 2021-06-01: 2000 mg via INTRAVENOUS
  Filled 2021-06-01: qty 100

## 2021-06-01 NOTE — Progress Notes (Signed)
CSW acknowledges consult. CSW will continue to follow and complete consult when appropriate.

## 2021-06-01 NOTE — ED Provider Notes (Signed)
[facility] Provider Note   CSN: [id number] Arrival date & time: 05/13/2021  2011     History  Chief Complaint  Patient presents with   Cardiac Arrest    Norman Davis is a 81 y.o. male.   Cardiac Arrest Patient presents after outpatient cardiac arrest.  Reportedly was at home and wife heard a thump while he was eating.  Found him on the ground.  Reportedly had some food in his mouth.  CPR immediately started.  EMS had been called.  Fire had been there but only continued CPR.  Found to be in PEA.  Reportedly was somewhat bradycardic.  Had a total of 9 epinephrines and had return of vitals a few times.  They stated whenever they moved him they would lose his vitals.  Sugar was 100 something.  King airway had been placed.    Home Medications Prior to Admission medications   Medication Sig Start Date End Date Taking? Authorizing Provider  acetaminophen (TYLENOL) 500 MG tablet Take 500 mg by mouth daily.    [provider]  albuterol (PROVENTIL HFA;VENTOLIN HFA) 108 (90 BASE) MCG/ACT inhaler Inhale 2 puffs into the lungs every 4 (four) hours as needed for wheezing or shortness of breath.    [provider]  amLODipine (NORVASC) 5 MG tablet Take 2 tablets (10 mg total) by mouth daily. 05/10/21   Bonnielee Haff, MD  atorvastatin (LIPITOR) 40 MG tablet Take 1 tablet (40 mg total) by mouth daily at 6 PM. 05/10/21   Bonnielee Haff, MD  clopidogrel (PLAVIX) 75 MG tablet Take 75 mg by mouth daily with breakfast.    [provider]  dexamethasone (DECADRON) 2 MG tablet Take 2 tablets twice daily for 7 days, then take 2 tablets once daily for 1 week, then take 1 tablet once daily for 1 week, then STOP 05/10/21   Bonnielee Haff, MD  dorzolamidel-timolol (COSOPT) 22.3-6.8 MG/ML SOLN ophthalmic solution Place 1 drop into both eyes 2 (two) times daily.    [provider]  doxazosin (CARDURA) 8 MG tablet Take 4 mg by mouth at bedtime.    [provider]  finasteride (PROSCAR) 5 MG tablet Take 5 mg by mouth daily.    [provider]  FLUoxetine (PROZAC) 20 MG capsule Take 40 mg by mouth daily.    [provider]  fluticasone (FLONASE) 50 MCG/ACT nasal spray Place 2 sprays into both nostrils daily.    [provider]  hydrochlorothiazide (HYDRODIURIL) 25 MG tablet Take 25 mg by mouth daily.    [provider]  losartan (COZAAR) 100 MG tablet Take 100 mg by mouth at bedtime.    [provider]  meloxicam (MOBIC) 15 MG tablet Take 15 mg by mouth daily.    [provider]  methocarbamol (ROBAXIN) 500 MG tablet Take 1 tablet (500 mg total) by mouth every 8 (eight) hours as needed for muscle spasms. 05/10/21   Bonnielee Haff, MD  mirtazapine (REMERON) 30 MG tablet Take 30 mg by mouth at bedtime.    [provider]  oxyCODONE (OXY IR/ROXICODONE) 5 MG immediate release tablet Take 1 tablet (5 mg total) by mouth every 6 (six) hours as needed for severe pain. 05/10/21   Bonnielee Haff, MD  paliperidone (INVEGA) 3 MG 24 hr tablet Take 3 mg by mouth daily.    [provider]  pantoprazole (PROTONIX) 40 MG tablet Take 40 mg by mouth 2 (two) times daily.    [provider]  polyethylene glycol (MIRALAX / GLYCOLAX) packet Take 17 g by mouth daily.    [provider]  potassium chloride (KLOR-CON) 20 MEQ packet Take 20 mEq by mouth every other day.    [provider]  pregabalin (LYRICA) 25 MG capsule Take 25 mg by mouth at bedtime.    [provider]  senna (SENOKOT) 8.6 MG TABS tablet Take 2 tablets by mouth every other day.    [provider]  Vitamin D, Ergocalciferol, (DRISDOL) 1.25 MG (50000 UNIT) CAPS capsule Take 50,000 Units by mouth every 7 (seven) days. Wednesdays    [provider]      Allergies    Gabapentin, Penicillins, Tramadol, and Aspirin    Review of Systems   Review of Systems  Unable to perform ROS:  Patient nonverbal   Physical Exam Updated Vital Signs BP 95/76    Pulse 87    Temp (!) 90.9 F (32.7 C)    Resp (!) 30    SpO2 100%  Physical Exam Vitals reviewed.  HENT:     Head: Atraumatic.  Eyes:     Comments: Pupils are round 3 mm.  Cardiovascular:     Comments: Initial pulse later lost. Abdominal:     General: There is distension.     Comments: Right upper quadrant and mid abdominal scar.  Musculoskeletal:     Right lower leg: No edema.     Left lower leg: No edema.  Skin:    General: Skin is warm.  Neurological:     Comments: Unresponsive.  No spontaneous breathing.    ED Results / Procedures / Treatments   Labs (all labs ordered are listed, but only abnormal results are displayed) Labs Reviewed  CBC - Abnormal; Notable for the following components:      Result Value   RBC 3.74 (*)    Hemoglobin 10.5 (*)    HCT 35.3 (*)    MCHC 29.7 (*)    RDW 15.9 (*)    Platelets 97 (*)    nRBC 0.4 (*)    All other components within normal limits  BASIC METABOLIC PANEL - Abnormal; Notable for the following components:   Sodium 131 (*)    Chloride 97 (*)    CO2 19 (*)    Glucose, Bld 276 (*)    Creatinine, Ser 1.75 (*)    Calcium 7.9 (*)    GFR, Estimated 38 (*)    All other components within normal limits  LACTIC ACID, PLASMA - Abnormal; Notable for the following components:   Lactic Acid, Venous >9.0 (*)    All other components within normal limits  HEPATIC FUNCTION PANEL - Abnormal; Notable for the following components:   Total Protein 5.1 (*)    Albumin 2.5 (*)    AST 66 (*)    Alkaline Phosphatase 153 (*)    All other components within normal limits  PROTIME-INR - Abnormal; Notable for the following components:   Prothrombin Time 18.0 (*)    INR 1.5 (*)    All other components within normal limits  GLUCOSE, CAPILLARY - Abnormal; Notable for the following components:   Glucose-Capillary 301 (*)    All other components within normal limits  I-STAT CHEM 8, ED  - Abnormal; Notable for the following components:   Sodium 134 (*)    Creatinine, Ser 1.60 (*)    Glucose, Bld 254 (*)    Calcium, Ion 1.06 (*)    TCO2 21 (*)  Hemoglobin 11.6 (*)    HCT 34.0 (*)    All other components within normal limits  I-STAT ARTERIAL BLOOD GAS, ED - Abnormal; Notable for the following components:   pH, Arterial 7.072 (*)    pCO2 arterial 63.4 (*)    pO2, Arterial 182 (*)    Bicarbonate 19.0 (*)    TCO2 21 (*)    Acid-base deficit 12.0 (*)    Sodium 132 (*)    Calcium, Ion 1.14 (*)    HCT 32.0 (*)    Hemoglobin 10.9 (*)    All other components within normal limits  I-STAT ARTERIAL BLOOD GAS, ED - Abnormal; Notable for the following components:   pH, Arterial 7.327 (*)    pO2, Arterial 204 (*)    Bicarbonate 19.8 (*)    TCO2 21 (*)    Acid-base deficit 6.0 (*)    Sodium 132 (*)    Calcium, Ion 1.04 (*)    HCT 34.0 (*)    Hemoglobin 11.6 (*)    All other components within normal limits  TROPONIN I (HIGH SENSITIVITY) - Abnormal; Notable for the following components:   Troponin I (High Sensitivity) 70 (*)    All other components within normal limits  TROPONIN I (HIGH SENSITIVITY) - Abnormal; Notable for the following components:   Troponin I (High Sensitivity) 53 (*)    All other components within normal limits  RESP PANEL BY RT-PCR (FLU A&B, COVID) ARPGX2  MRSA NEXT GEN BY PCR, NASAL  ETHANOL  URINALYSIS, ROUTINE W REFLEX MICROSCOPIC  BLOOD GAS, ARTERIAL  CBC  BASIC METABOLIC PANEL  MAGNESIUM  PHOSPHORUS  I-STAT CHEM 8, ED    EKG None  Radiology CT HEAD WO CONTRAST (5MM)  Result Date: 05/24/2021 CLINICAL DATA:  Head trauma, minor (Age >= 65y); Neck trauma (Age >= 65y). Collapse. CPR EXAM: CT HEAD WITHOUT CONTRAST CT CERVICAL SPINE WITHOUT CONTRAST TECHNIQUE: Multidetector CT imaging of the head and cervical spine was performed following the standard protocol without intravenous contrast. Multiplanar CT image reconstructions of the cervical  spine were also generated. RADIATION DOSE REDUCTION: This exam was performed according to the departmental dose-optimization program which includes automated exposure control, adjustment of the mA and/or kV according to patient size and/or use of iterative reconstruction technique. COMPARISON:  None. FINDINGS: CT HEAD FINDINGS BRAIN: BRAIN Mild patchy and confluent areas of decreased attenuation are noted throughout the deep and periventricular white matter of the cerebral hemispheres bilaterally, compatible with chronic microvascular ischemic disease. No evidence of large-territorial acute infarction. No parenchymal hemorrhage. No mass lesion. No extra-axial collection. No mass effect or midline shift. No hydrocephalus. Basilar cisterns are patent. Vascular: No hyperdense vessel. Atherosclerotic calcifications are present within the cavernous internal carotid and vertebral arteries. Skull: No acute fracture or focal lesion. Sinuses/Orbits: Left ethmoid and frontal mucosal thickening. Paranasal sinuses and mastoid air cells are clear. Left lens replacement. Otherwise the orbits are unremarkable. Other: None. CT CERVICAL SPINE FINDINGS Alignment: Normal. Skull base and vertebrae: Multilevel severe degenerative changes of the spine. No acute fracture. No aggressive appearing focal osseous lesion or focal pathologic process. Soft tissues and spinal canal: No prevertebral fluid or swelling. No visible canal hematoma. Upper chest: Bilateral pulmonary apices demonstrate gas. Other: Debris within the oro and hypopharynx. Endotracheal tube and enteric tubes partially visualized with endotracheal tube tore seeing coursing within the trachea and enteric tube coursing within the esophagus. IMPRESSION: 1. No acute intracranial abnormality. 2. No acute displaced fracture or traumatic listhesis of the  cervical spine. 3. Bilateral pulmonary apices demonstrate gas with pneumothoraces not excluded in the setting of previously  described bullous changes. Consider repeat chest x-ray. Electronically Signed   By: Iven Finn M.D.   On: 05/03/2021 22:51   CT Cervical Spine Wo Contrast  Result Date: 05/14/2021 CLINICAL DATA:  Head trauma, minor (Age >= 65y); Neck trauma (Age >= 65y). Collapse. CPR EXAM: CT HEAD WITHOUT CONTRAST CT CERVICAL SPINE WITHOUT CONTRAST TECHNIQUE: Multidetector CT imaging of the head and cervical spine was performed following the standard protocol without intravenous contrast. Multiplanar CT image reconstructions of the cervical spine were also generated. RADIATION DOSE REDUCTION: This exam was performed according to the departmental dose-optimization program which includes automated exposure control, adjustment of the mA and/or kV according to patient size and/or use of iterative reconstruction technique. COMPARISON:  None. FINDINGS: CT HEAD FINDINGS BRAIN: BRAIN Mild patchy and confluent areas of decreased attenuation are noted throughout the deep and periventricular white matter of the cerebral hemispheres bilaterally, compatible with chronic microvascular ischemic disease. No evidence of large-territorial acute infarction. No parenchymal hemorrhage. No mass lesion. No extra-axial collection. No mass effect or midline shift. No hydrocephalus. Basilar cisterns are patent. Vascular: No hyperdense vessel. Atherosclerotic calcifications are present within the cavernous internal carotid and vertebral arteries. Skull: No acute fracture or focal lesion. Sinuses/Orbits: Left ethmoid and frontal mucosal thickening. Paranasal sinuses and mastoid air cells are clear. Left lens replacement. Otherwise the orbits are unremarkable. Other: None. CT CERVICAL SPINE FINDINGS Alignment: Normal. Skull base and vertebrae: Multilevel severe degenerative changes of the spine. No acute fracture. No aggressive appearing focal osseous lesion or focal pathologic process. Soft tissues and spinal canal: No prevertebral fluid or swelling.  No visible canal hematoma. Upper chest: Bilateral pulmonary apices demonstrate gas. Other: Debris within the oro and hypopharynx. Endotracheal tube and enteric tubes partially visualized with endotracheal tube tore seeing coursing within the trachea and enteric tube coursing within the esophagus. IMPRESSION: 1. No acute intracranial abnormality. 2. No acute displaced fracture or traumatic listhesis of the cervical spine. 3. Bilateral pulmonary apices demonstrate gas with pneumothoraces not excluded in the setting of previously described bullous changes. Consider repeat chest x-ray. Electronically Signed   By: Iven Finn M.D.   On: 05/11/2021 22:51   DG Chest Portable 1 View  Result Date: 06/01/2021 CLINICAL DATA:  Cardiac arrest, intubated EXAM: PORTABLE CHEST 1 VIEW COMPARISON:  05/04/2021 FINDINGS: Single frontal view of the chest demonstrates endotracheal tube overlying tracheal air column, tip midway between thoracic inlet and carina. Cardiac silhouette is stable. Continued ectasia of the thoracic aorta. 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 Increased vascular congestion and interstitial prominence consistent with mild edema. No effusion or pneumothorax. No acute bony abnormalities. IMPRESSION: 1. No complication after intubation. 2. 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 Electronically Signed   By: Randa Ngo M.D.   On: 05/12/2021 20:45    Procedures Procedures    Medications Ordered in ED Medications  norepinephrine (LEVOPHED) 35m in 2518m(0.016 mg/mL) premix infusion (30 mcg/min Intravenous Rate/Dose Change 05/10/2021 2200)  EPINEPHrine (ADRENALIN) 5 mg in NS 250 mL (0.02 mg/mL) premix infusion (10 mcg/min Intravenous New Bag/Given 05/02/2021 2107)  0.9 %  sodium chloride infusion (has no administration in time range)  docusate (COLACE) 50 MG/5ML liquid 100 mg (100 mg Per Tube Given 05/25/2021 2349)  polyethylene glycol (MIRALAX  / GLYCOLAX) packet 17 g (17 g Per Tube Given 05/07/2021 2350)  ondansetron (ZOFRAN)  injection 4 mg (has no administration in time range)  acetaminophen (TYLENOL) tablet 650 mg (has no administration in time range)    Or  acetaminophen (TYLENOL) 160 MG/5ML solution 650 mg (has no administration in time range)    Or  acetaminophen (TYLENOL) suppository 650 mg (has no administration in time range)  0.9 %  sodium chloride infusion (has no administration in time range)  pantoprazole (PROTONIX) injection 40 mg (40 mg Intravenous Given 05/19/2021 2349)  heparin injection 5,000 Units (5,000 Units Subcutaneous Given 05/18/2021 2350)  fentaNYL (SUBLIMAZE) injection 25 mcg (has no administration in time range)  fentaNYL (SUBLIMAZE) injection 25-100 mcg (has no administration in time range)  ipratropium-albuterol (DUONEB) 0.5-2.5 (3) MG/3ML nebulizer solution 3 mL (3 mLs Nebulization Given 05/19/2021 2213)  ipratropium-albuterol (DUONEB) 0.5-2.5 (3) MG/3ML nebulizer solution 3 mL (has no administration in time range)  sodium chloride flush (NS) 0.9 % injection 10-40 mL (has no administration in time range)  sodium chloride flush (NS) 0.9 % injection 10-40 mL (10 mLs Intracatheter Given 05/16/2021 2351)  chlorhexidine gluconate (MEDLINE KIT) (PERIDEX) 0.12 % solution 15 mL (15 mLs Mouth Rinse Given 05/03/2021 2351)  MEDLINE mouth rinse (0 mLs Mouth Rinse Duplicate 01/08/30 1216)  Chlorhexidine Gluconate Cloth 2 % PADS 6 each (6 each Topical Given 05/23/2021 2300)  EPINEPHrine (ADRENALIN) 1 MG/10ML injection (0.5 mg Intravenous Given 05/15/2021 2102)  EPINEPHrine (ADRENALIN) 1 MG/10ML injection (0.5 mg Intravenous Given 05/26/2021 2053)  sodium bicarbonate injection (50 mEq Intravenous Given 05/10/2021 2056)    ED Course/ Medical Decision Making/ A&P                           Medical Decision Making Amount and/or Complexity of Data Reviewed External Data Reviewed: notes. Labs: ordered. Radiology: ordered and independent  interpretation performed. Discussion of management or test interpretation with external provider(s): Intenisivist  Risk Prescription drug management. Decision regarding hospitalization.  Critical Care Total time providing critical care: 30-74 minutes  Patient presents after cardiac arrest.  Had loss of vitals here around 3 times.  Epinephrine bolus would bring him back.  Started on Levophed drip but still required boluses of epi.  Intubated by Dr. Otila Kluver under my direct supervision.  Chest x-ray done and independently interpreted.  ET tube in good position along with some bullous emphysema.  Also some volume overload.  pH is right around 7 but bicarb only 19.  Discussed with ICU, who will admit patient.  Discussed with patient's family who states he is a full code.  With intubation there was large amount of food in the mouth.  Likely aspiration event.  Cardiopulmonary Resuscitation (CPR) Procedure Note Directed/Performed by: Davonna Belling I personally directed ancillary staff and/or performed CPR in an effort to regain return of spontaneous circulation and to maintain cardiac, neuro and systemic perfusion.         Final Clinical Impression(s) / ED Diagnoses Final diagnoses:  Encounter for imaging study to confirm orogastric (OG) tube placement    Rx / DC Orders ED Discharge Orders     None         Davonna Belling, MD 06/01/21 0006

## 2021-06-01 NOTE — Progress Notes (Addendum)
eLink Physician-Brief Progress Note Patient Name: Norman Davis DOB: 1940/06/17 MRN: [id number]   Date of Service  06/01/2021  HPI/Events of Note  Hypokalemia   Hypomagnesemia - K+ = 2.9, Mg++ = 1.3 and Creatinine = 2.28. BRB per NGT - likely d/t traumatic insertion. Hgb = 11.6. Patient is already on Protonix IV.   eICU Interventions  Will cautiously replace K+ and Mg++. Place NGT to gravity drainage.      Intervention Category Major Interventions: Electrolyte abnormality - evaluation and management  [name] 06/01/2021, 6:53 AM

## 2021-06-01 NOTE — Progress Notes (Signed)
eLink Physician-Brief Progress Note Patient Name: Samarth Ogle DOB: 1940-12-24 MRN: [id number]   Date of Service  06/01/2021  HPI/Events of Note  Multiple issues: 1. Hypotension - BP = 84/53 with MAP - 62. 2. Hypocalcemia - ionized Ca++ = 1.02. 3. Oliguria - Urine output = 65 mL since 7 PM. Femoral CVL, therefore, no CVP possible. Albumin = 2.5. 4. ABG on = 50%/PRVC 30/TV 560/P 5 = 7.435/18.0/200/12.6.  eICU Interventions  Plan: Increase ceiling on Norepinephrine IV infusion to 60 mcg/min. 25% Albumin 25 gm IV X 1 now.  Replace Ca++. NaHCO3 IV infusion to run at 100 mL/hour. Repeat ABG at 6 AM.     Intervention Category Major Interventions: Electrolyte abnormality - evaluation and management;Hypotension - evaluation and management  Antoria Lanza Dennard Nip 06/01/2021, 2:15 AM

## 2021-06-01 NOTE — Progress Notes (Signed)
PCCM interval progress note:  Met with patient's sister and several other friends and family members and discussed patient's current status and concern that patient has suffered a significant hypoxic injury as he has been off sedation and is not waking up at all, pupils are fixed and has no gag or corneal reflexes as well as severe shock requiring high dose levophed and epinephrine. Family stated that patient would not have wanted to live on life support for any prolonged period of time.  Option to transition to comfort care was discussed.  Pt's sister is going to reach out to more family members before making decisions, but stated that she did not want her brother to suffer without the chance of meaningful recovery.  He is DNR, continue current level of care and family will contact once they have had a chance to convene.   Norman Carpen Shakura Cowing, PA-C [facility] See Amion for pager If no response to pager , please call [phone] until 7pm After 7:00 pm call Elink  [phone]?[place]

## 2021-06-01 NOTE — Progress Notes (Signed)
eLink Physician-Brief Progress Note Patient Name: Norman Davis DOB: [birth year] MRN: [id number]   Date of Service  06/01/2021  HPI/Events of Note  ABG on 50%/PRVC 30/TV 560/P 5 = 7.375/23.8/156/14.5  eICU Interventions  Continue present management.     Intervention Category Major Interventions: Respiratory failure - evaluation and management  Greogory Cornette Eugene 06/01/2021, 6:24 AM

## 2021-06-01 NOTE — Progress Notes (Signed)
EEG complete - results pending 

## 2021-06-01 NOTE — Progress Notes (Signed)
Initial Nutrition Assessment  DOCUMENTATION CODES:  Not applicable  INTERVENTION:  If care is escalated, recommend replacing current OGT with cortrak tube  If indicated monitor pt for stability to begin enteral nutrition. Recommend the following: Vital AF 1.2 at goal of 20mL/h ( total volume daily) 1 packet of prosource TF daily (40kcal, 11g of protein) Free water flush: q4h This regimen provides 1624 kcal, 110g of protein, and free water (TF+flush)  NUTRITION DIAGNOSIS:  Inadequate oral intake related to inability to eat as evidenced by NPO status (ventilated).  GOAL:  Provide needs based on ASPEN/SCCM guidelines   MONITOR:  Vent status, I & O's, Labs  REASON FOR ASSESSMENT:  Ventilator    ASSESSMENT:  81 y.o. man with history of Paget's disease, HTN, HLD, GERD, BPH, and COPD presented to ED after having a PEA cardiac arrest at home. Prolonged downtime prior to achieving ROSC.  Neurology following, pt unresponsive off of sedation with fixed dilated pupils.  Pt resting in bed at the time of assessment, multiple family members at bedside able to provide a nutrition hx. Reports that appetite has always been great, no recent weight loss.  Patient is currently intubated on ventilator support. Lactic acid elevated and not trending down at this time. Also receiving 2 high dose pressors with increasing infusion rates over the last 24 hours.   Noted that OGT was placed last night, but imaging showed initial placement was in the esophagus and then after advancement was above the GE junction. Discussed with RN, tube is unable to be advanced further with bright red blood present in tube. Provider meeting with family to discuss escalation of care. If indicated, pt will attempt to have cortrak tube placed 2/1 and enteral nutrition can be provided when medically stable.   Will monitor GOC discussions and patients trajectory.   No weight loss seen in recent months, pt  appears to have gained weight over the last several years family confirmed this.   MV: 15.7 L/min Temp (24hrs), Avg:91.7 F (33.2 C), Min:89.8 F (32.1 C), Max:95 F (35 C)   Intake/Output Summary (Last 24 hours) at 06/01/2021 1435 Last data filed at 06/01/2021 1000 Gross per 24 hour  Intake 1012.56 ml  Output 75 ml  Net 937.56 ml  Net IO Since Admission: 937.56 mL [06/01/21 1435]  Nutritionally Relevant Medications: Scheduled Meds:  docusate  100 mg Per Tube BID   insulin aspart  0-15 Units Subcutaneous Q4H   pantoprazole IV  40 mg Intravenous QHS   polyethylene glycol  17 g Per Tube Daily   Continuous Infusions:  epinephrine 11 mcg/min (06/01/21 0600)   norepinephrine (LEVOPHED) infusion 46 mcg/min (06/01/21 1245)    sodium bicarbonate (isotonic) infusion in sterile water 100 mL/hr at 06/01/21 0600   PRN Meds: ondansetron  Labs Reviewed: Potassium 2.9  BUN 24 / creatinine 2.28 Lactic Acid >9.0 SBG ranges from 143-349 mg/dL over the last 24 hours  NUTRITION - FOCUSED PHYSICAL EXAM: Flowsheet Row Most Recent Value  Orbital Region No depletion  Upper Arm Region No depletion  Thoracic and Lumbar Region No depletion  Buccal Region No depletion  Temple Region No depletion  Clavicle Bone Region No depletion  Clavicle and Acromion Bone Region No depletion  Scapular Bone Region No depletion  Dorsal Hand No depletion  Patellar Region No depletion  Anterior Thigh Region No depletion  Posterior Calf Region No depletion  Edema (RD Assessment) Mild  Hair Reviewed  Eyes Unable to assess  Mouth Unable  to assess  Skin Reviewed  Nails Reviewed   Diet Order:   Diet Order     None       EDUCATION NEEDS:  Not appropriate for education at this time  Skin:  Skin Assessment: Reviewed RN Assessment  Last BM:  prior to admission  Height:  Ht Readings from Last 1 Encounters:  05/04/21 5\' 9"  (1.753 m)   Weight:  Wt Readings from Last 1 Encounters:  06/01/21 86.2 kg     Ideal Body Weight:  72.7 kg  BMI:  Body mass index is 28.06 kg/m.  Estimated Nutritional Needs:  Kcal:  1616 kcal/d ([employer]) Protein:  105-120 g/c Fluid:  2.2-2.5 L/d   06/03/21, RD, LDN Clinical Dietitian RD pager # available in AMION  After hours/weekend pager # available in [facility]

## 2021-06-01 NOTE — Progress Notes (Signed)
°  Echocardiogram 2D Echocardiogram has been performed.  Norman Davis 06/01/2021, 10:40 AM

## 2021-06-01 NOTE — Progress Notes (Signed)
LTM EEG hooked up and running - no initial skin breakdown - push button tested - neuro notified. [facility] monitoring.  

## 2021-06-01 NOTE — Progress Notes (Signed)
eLink Physician-Brief Progress Note Patient Name: Norman Davis DOB: 05-Nov-1940 MRN: [id number]   Date of Service  06/01/2021  HPI/Events of Note  K+ = 3.6 and Mg++ = 1.7. Given deterioration in renal function (Creatinine = 2.28 --> 2.62) will not replace further at this time.   eICU Interventions  Plan: BMP and Mg++ already ordered for AM.      Intervention Category Major Interventions: Electrolyte abnormality - evaluation and management  Danialle Dement Cornelia Copa 06/01/2021, 8:13 PM

## 2021-06-01 NOTE — Progress Notes (Signed)
eLink Physician-Brief Progress Note Patient Name: Norman Davis DOB: 1940-11-30 MRN: [id number]   Date of Service  06/01/2021  HPI/Events of Note  Multiple issues: 1. Hyperglycemia - Blood glucose = 301. 2. Hypotension - Nursing request for quad strength Norepinephrine IV infusion. 3. Lactic Acid > 9.0 - Nursing request for f/u Lactic Acid level.   eICU Interventions  Plan: Q 4 hour moderate Novolog SSI. Will make Norepinephrine IV infusion quad strength. Repeat Lactic Acid level at 2 AM.     Intervention Category Major Interventions: Hyperglycemia - active titration of insulin therapy;Hypotension - evaluation and management;Acid-Base disturbance - evaluation and management  [name] 06/01/2021, 12:29 AM

## 2021-06-01 NOTE — Progress Notes (Signed)
eLink Physician-Brief Progress Note Patient Name: Kier Smead DOB: 04-25-1941 MRN: [id number]   Date of Service  06/01/2021  HPI/Events of Note  Multiple issues: 1. Oozing central venous line - Nursing request for Thrombi-pad. 2. Lactic Acid remains > 9.0 - BP = 103/86 with MAP = 68. Hgb = 12.6. Femoral central venous line. Not able to get CVP. 3. NGT tip in distal esophagus after several attempts to advance it.  eICU Interventions  Plan: Thrombi-pad to oozing central venous line site.  COOX now even though not optimal. Repeat Lactic Acid level at 10 AM. NGT to LIS to try to decompress stomach. Obviously, NGT can not be used for medications or nutrition. PCCM rounding team to have CorTrak placed vs endoscopic NGT placement.      Intervention Category Major Interventions: Other:;Acid-Base disturbance - evaluation and management  Ferry Matthis Eugene 06/01/2021, 5:05 AM

## 2021-06-01 NOTE — Progress Notes (Addendum)
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 Who was finishing dinner then lost consciousness suddenly.  According to his sister who lives with him and helps care for him, he was finishing diner when she stepped out of the kitchen.  She then heard him fall to the floor and found him unresponsive.  She was unable to turn him over to start cpr, and immediately called 911.  He was found to be in PEA per ED, king airway was placed.  Per verbal report from ED physician, patient underwent CPR by EMS, would intermittently achieve ROSC and then develop PEA again, total of about 45 min of arrest/rosc period, unk exact amount of time coding.  Was transcutaneously paced for a time as well.    In ED, king airway was removed, patient was intubated without paralytic or sedation.  Food debris seen in the airway.  PEA arrest developed shortly after, Rosc achieved.   Again developed PEA several minutes later, started on  levophed gtt and epi infusion.   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  Spinal stenosis (recently admitted, not surgical candidate, treated with steroids).   Cholelithiasis GERD  Schizoaffective d/o (vs  schizophrenia per chart) Hx SBO   Takes plavix unk reason Meds:  albuterol, tylenol, plavix, norvasc, doxazosin, finasteride, fluoxetine, fluticasone, losartan, melatonin, remeron, folic acid, norco, mirabegron, pravastatin, spiriva.  Significant Hospital Events: Including procedures, antibiotic start and stop dates  in addition to other pertinent events   1/30 Brought into the ED with Pea arrest, significant down time, started on Epinephrine and Levophed 1/31 unresponsive off sedation, requiring high dose Epi and Levophed  Interim History / Subjective:  Pt admitted last night and has been off sedation, he is unresponsive with fixed pupils and absent reflexes with high dose pressors   Objective   Blood pressure 102/69, pulse 85, temperature (!) 91.8 F (33.2 C), resp. rate (!) 30, weight 86.2 kg, SpO2 100 %.    Vent Mode: PRVC FiO2 (%):  [0.7 %-100 %] 0.7 % Set Rate:  [24 bmp-30 bmp] 30 bmp Vt Set:  [560 mL] 560 mL PEEP:  [5 cmH20] 5 cmH20 Plateau Pressure:  [16 cmH20-18 cmH20] 16 cmH20   Intake/Output Summary (Last 24 hours) at 06/01/2021 0831 Last data filed at 06/01/2021 0600 Gross per 24 hour  Intake 1012.56 ml  Output 60 ml  Net 952.56 ml   Filed Weights   06/01/21 0419  Weight: 86.2 kg    General:  thin, elderly M, critically ill-appearing intubated and sedated  HEENT: MM pink/dry, pupils 75mm and fixed, ETT in place, NG tube with bloody output Neuro:  examined off sedation, unresponsive voice or pain, no myoclonus, absent gag, corneal reflexes and pupils fixed  CV: s1s2 rrr, no m/r/g PULM:  mechanical breath sounds bilaterally on PRVC  GI: soft, bsx4 active  Extremities: warm/dry, no  edema  Skin: no rashes or lesions   Resolved Hospital Problem list     Assessment & Plan:  Out of hospital PEA arrest Shock Lactic acidosis Hypoxic Respiratory Failure  Hypokalemia Hypomagnesemia With prolonged ACLS ~45 minutes and unknown down time prior to EMS arrival, [facility]  Most likely 2/2 hypoxia due to choking, given history and findings on intubation.  -continue supportive care with Vent, replete electrolytes -normothermia protocol, remains hypothermic this AM -continue Levophed and Epi to maintain MAP >65 -echo pending -EEG -Pt  is DNR, discussed concern for poor prognosis with patient's sister and [facility] caseworker at the bedside  Acute Kidney Injury Likely ATN in the setting of shock -making urine, trend BMP and UOP, monitor electroytes, ensure renal perfusion and avoid nephrotoxins as able  COPD on spiriva per chart.   -Duonebs while intubated.   Schizoaffective d/o -on fluoxetine. Remeron  GERD  -on protonix   HTN -hold home medications in the setting of shock  HLD -continue statin  BPH -on finasteride, mirabegron    Best Practice (right click and "Reselect all SmartList Selections" daily)   Diet/type: NPO DVT prophylaxis: other start heparin tomorrow  GI prophylaxis: PPI Lines: Central line Foley:  N/A Code Status:  DNR Last date of multidisciplinary goals of care discussion [Sister updated via phone, coming into the hospital, continue supportive care]  Labs   CBC: Recent Labs  Lab 2021/07/07 2026 2021/07/07 2045 2021/07/07 2050 2021/07/07 2144 06/01/21 0152 06/01/21 0549 06/01/21 0552  WBC 10.2  --   --   --   --  17.8*  --   HGB 10.5*   < > 10.9* 11.6* 12.6* 11.6* 11.6*  HCT 35.3*   < > 32.0* 34.0* 37.0* 34.7* 34.0*  MCV 94.4  --   --   --   --  86.1  --   PLT 97*  --   --   --   --  91*  --    < > = values in this interval not displayed.     Basic Metabolic Panel: Recent Labs  Lab 2021/07/07 2026 2021/07/07 2045 2021/07/07 2050 2021/07/07 2144 06/01/21 0152 06/01/21 0549 06/01/21 0552  NA 131* 134* 132* 132* 132* 135 136  K 3.9 4.1 4.4 4.1 3.5 2.9* 2.9*  CL 97* 99  --   --   --  100  --   CO2 19*  --   --   --   --  14*  --   GLUCOSE 276* 254*  --   --   --  232*  --   BUN 14 15  --   --   --  24*  --   CREATININE 1.75* 1.60*  --   --   --  2.28*  --   CALCIUM 7.9*  --   --   --   --  8.1*  --   MG  --   --   --   --   --  1.3*  --   PHOS  --   --   --   --   --  3.8  --     GFR: Estimated Creatinine Clearance: 27.2 mL/min (A) (by C-G formula based on SCr of 2.28 mg/dL  (H)). Recent Labs  Lab 2021/07/07 2026 2021/07/07 2300 06/01/21 0110 06/01/21 0549  WBC 10.2  --   --  17.8*  LATICACIDVEN  --  >9.0* >9.0*  --      Liver Function Tests: Recent Labs  Lab 2021/07/07 2050  AST 66*  ALT 42  ALKPHOS 153*  BILITOT 0.7  PROT 5.1*  ALBUMIN 2.5*    No results for input(s): LIPASE, AMYLASE in the last 168 hours. No results for input(s): AMMONIA in the last 168 hours.  ABG    Component Value Date/Time   PHART 7.375 06/01/2021 0552   PCO2ART 23.8 (L) 06/01/2021 0552   PO2ART 156 (H) 06/01/2021 0552   HCO3 14.5 (L) 06/01/2021 0552   TCO2 15 (L) 06/01/2021 0552   ACIDBASEDEF 10.0 (H) 06/01/2021 0552   O2SAT 100.0 06/01/2021 0552      Coagulation Profile: Recent Labs  Lab 09-Jun-2021 2204  INR 1.5*    Cardiac Enzymes: No results for input(s): CKTOTAL, CKMB, CKMBINDEX, TROPONINI in the last 168 hours.  HbA1C: Hgb A1c MFr Bld  Date/Time Value Ref Range Status  06/01/2021 05:49 AM 5.4 4.8 - 5.6 % Final    Comment:    (NOTE) Pre diabetes:          5.7%-6.4%  Diabetes:              >6.4%  Glycemic control for   <7.0% adults with diabetes   05/24/2017 03:07 AM 5.3 4.8 - 5.6 % Final    Comment:    (NOTE) Pre diabetes:          5.7%-6.4% Diabetes:              >6.4% Glycemic control for   <7.0% adults with diabetes     CBG: Recent Labs  Lab 06/09/2021 2312 06/01/21 0048 06/01/21 0414 06/01/21 0812  GLUCAP 301* 349* 280* 198*    Review of Systems:   Unable to assess  Past Medical History:  He,  has a past medical history of Cholelithiasis, Fall (07/22/2015), GERD (gastroesophageal reflux disease), H/O urinary retention, Hypercholesteremia, Hypertension, Nephrolithiasis, Paget's disease of bone, Schizophrenia (HCC), Shortness of breath dyspnea, and 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   Surgical History:   Past Surgical History:  Procedure Laterality Date   BACK SURGERY     IR LUMBAR DISC ASPIRATION W/IMG GUIDE   04/13/2017   LIVER SURGERY     PELVIC FRACTURE SURGERY     TOTAL HIP ARTHROPLASTY       Social History:   reports that he quit smoking about 21 years ago. His smoking use included cigarettes. He smoked an average of 1 pack per day. He has never used smokeless tobacco. He reports that he does not drink alcohol and does not use drugs.   Family History:  His family history includes Cancer in his father and mother.   Allergies Allergies  Allergen Reactions   Gabapentin     Other reaction(s): Delirium   Penicillins     Unknown Has patient had a PCN reaction causing immediate rash, facial/tongue/throat swelling, SOB or lightheadedness with hypotension: NO Has patient had a PCN reaction causing severe rash involving mucus membranes or skin necrosis: NO Has patient had a PCN reaction that required hospitalization NO Has patient had a PCN reaction occurring within the last 10 years: NO If all of the above answers are "NO", then may proceed with Cephalosporin use.   Tramadol     Other reaction(s): Delirium   Aspirin Rash     Home Medications  Prior to Admission medications   Medication Sig Start Date End Date Taking? Authorizing Provider  acetaminophen (TYLENOL) 500 MG tablet Take 500 mg by mouth daily.    [provider]  albuterol (PROVENTIL HFA;VENTOLIN HFA) 108 (90 BASE) MCG/ACT inhaler Inhale 2 puffs into  the lungs every 4 (four) hours as needed for wheezing or shortness of breath.    [provider]  amLODipine (NORVASC) 5 MG tablet Take 2 tablets (10 mg total) by mouth daily. 05/10/21   Osvaldo Shipper, MD  atorvastatin (LIPITOR) 40 MG tablet Take 1 tablet (40 mg total) by mouth daily at 6 PM. 05/10/21   Osvaldo Shipper, MD  clopidogrel (PLAVIX) 75 MG tablet Take 75 mg by mouth daily with breakfast.    [provider]  dexamethasone (DECADRON) 2 MG tablet Take 2 tablets twice daily for 7 days, then take 2 tablets once daily for 1 week, then take 1 tablet once  daily for 1 week, then STOP 05/10/21   Osvaldo Shipper, MD  dorzolamidel-timolol (COSOPT) 22.3-6.8 MG/ML SOLN ophthalmic solution Place 1 drop into both eyes 2 (two) times daily.    [provider]  doxazosin (CARDURA) 8 MG tablet Take 4 mg by mouth at bedtime.    [provider]  finasteride (PROSCAR) 5 MG tablet Take 5 mg by mouth daily.    [provider]  FLUoxetine (PROZAC) 20 MG capsule Take 40 mg by mouth daily.    [provider]  fluticasone (FLONASE) 50 MCG/ACT nasal spray Place 2 sprays into both nostrils daily.    [provider]  hydrochlorothiazide (HYDRODIURIL) 25 MG tablet Take 25 mg by mouth daily.    [provider]  losartan (COZAAR) 100 MG tablet Take 100 mg by mouth at bedtime.    [provider]  meloxicam (MOBIC) 15 MG tablet Take 15 mg by mouth daily.    [provider]  methocarbamol (ROBAXIN) 500 MG tablet Take 1 tablet (500 mg total) by mouth every 8 (eight) hours as needed for muscle spasms. 05/10/21   Osvaldo Shipper, MD  mirtazapine (REMERON) 30 MG tablet Take 30 mg by mouth at bedtime.    [provider]  oxyCODONE (OXY IR/ROXICODONE) 5 MG immediate release tablet Take 1 tablet (5 mg total) by mouth every 6 (six) hours as needed for severe pain. 05/10/21   Osvaldo Shipper, MD  paliperidone (INVEGA) 3 MG 24 hr tablet Take 3 mg by mouth daily.    [provider]  pantoprazole (PROTONIX) 40 MG tablet Take 40 mg by mouth 2 (two) times daily.    [provider]  polyethylene glycol (MIRALAX / GLYCOLAX) packet Take 17 g by mouth daily.    [provider]  potassium chloride (KLOR-CON) 20 MEQ packet Take 20 mEq by mouth every other day.    [provider]  pregabalin (LYRICA) 25 MG capsule Take 25 mg by mouth at bedtime.    [provider]  senna (SENOKOT) 8.6 MG TABS tablet Take 2 tablets by mouth every other day.    [provider]  Vitamin D,  Ergocalciferol, (DRISDOL) 1.25 MG (50000 UNIT) CAPS capsule Take 50,000 Units by mouth every 7 (seven) days. Wednesdays    [provider]     Critical care time: 35 minutes       CRITICAL CARE Performed by: Darcella Gasman Anaeli Cornwall   Total critical care time: 35 minutes  Critical care time was exclusive of separately billable procedures and treating other patients.  Critical care was necessary to treat or prevent imminent or life-threatening deterioration.  Critical care was time spent personally by me on the following activities: development of treatment plan with patient and/or surrogate as well as nursing, discussions with consultants, evaluation of patient's response to treatment,  examination of patient, obtaining history from patient or surrogate, ordering and performing treatments and interventions, ordering and review of laboratory studies, ordering and review of radiographic studies, pulse oximetry and re-evaluation of patient's condition.    Darcella GasmanLaura R Kaedence Connelly, PA-C [facility] See Amion for pager If no response to pager , please call [phone] until 7pm After 7:00 pm call Elink  [phone]

## 2021-06-01 NOTE — Procedures (Addendum)
Patient Name: Norman Davis  MRN: [id number]  Epilepsy Attending: Charlsie Quest  Referring Physician/Provider: Duayne Cal, NP Date: 05/19/2021 Duration: 24.05 mins  Patient history: 81 year old male status post cardiac arrest.  EEG to evaluate for seizure.  Level of alertness: comatose  AEDs during EEG study: None  Technical aspects: This EEG study was done with scalp electrodes positioned according to the 10-20 International system of electrode placement. Electrical activity was acquired at a sampling rate of 500Hz  and reviewed with a high frequency filter of 70Hz  and a low frequency filter of 1Hz . EEG data were recorded continuously and digitally stored.   Description: EEG showed continuous generalized background suppression.  EEG was not reactive to tactile stimulation. Hyperventilation and photic stimulation were not performed.     ABNORMALITY -Background suppression, generalized  IMPRESSION: This study is suggestive of profound diffuse encephalopathy, nonspecific etiology. No seizures or epileptiform discharges were seen throughout the recording.  Lazaria Schaben 

## 2021-06-01 NOTE — Procedures (Addendum)
Patient Name: Norman Davis  MRN: [id number]  Epilepsy Attending: Charlsie Quest  Referring Physician/Provider: Duayne Cal, NP Duration: 06/01/2021 0148 to 06-27-21 0148   Patient history: 81 year old male status post cardiac arrest.  EEG to evaluate for seizure.   Level of alertness: comatose   AEDs during EEG study: None   Technical aspects: This EEG study was done with scalp electrodes positioned according to the 10-20 International system of electrode placement. Electrical activity was acquired at a sampling rate of 500Hz  and reviewed with a high frequency filter of 70Hz  and a low frequency filter of 1Hz . EEG data were recorded continuously and digitally stored.    Description: EEG showed continuous generalized background suppression.  EEG was not reactive to tactile stimulation.  Hyperventilation and photic stimulation were not performed.      ABNORMALITY -Background suppression, generalized   IMPRESSION: This study is suggestive of profound diffuse encephalopathy, nonspecific etiology. No seizures or epileptiform discharges were seen throughout the recording.   Norman Davis 

## 2021-06-02 DIAGNOSIS — D696 Thrombocytopenia, unspecified: Secondary | ICD-10-CM

## 2021-06-02 DIAGNOSIS — R579 Shock, unspecified: Secondary | ICD-10-CM | POA: Diagnosis not present

## 2021-06-02 DIAGNOSIS — I469 Cardiac arrest, cause unspecified: Secondary | ICD-10-CM | POA: Diagnosis not present

## 2021-06-02 LAB — CBC
HCT: 33 % — ABNORMAL LOW (ref 39.0–52.0)
Hemoglobin: 11 g/dL — ABNORMAL LOW (ref 13.0–17.0)
MCH: 28.1 pg (ref 26.0–34.0)
MCHC: 33.3 g/dL (ref 30.0–36.0)
MCV: 84.2 fL (ref 80.0–100.0)
Platelets: 51 10*3/uL — ABNORMAL LOW (ref 150–400)
RBC: 3.92 MIL/uL — ABNORMAL LOW (ref 4.22–5.81)
RDW: 16.9 % — ABNORMAL HIGH (ref 11.5–15.5)
WBC: 5.9 10*3/uL (ref 4.0–10.5)
nRBC: 0.7 % — ABNORMAL HIGH (ref 0.0–0.2)

## 2021-06-02 LAB — DIC (DISSEMINATED INTRAVASCULAR COAGULATION)PANEL
D-Dimer, Quant: 16.54 ug/mL-FEU — ABNORMAL HIGH (ref 0.00–0.50)
Fibrinogen: 236 mg/dL (ref 210–475)
INR: 1.7 — ABNORMAL HIGH (ref 0.8–1.2)
Platelets: 46 10*3/uL — ABNORMAL LOW (ref 150–400)
Prothrombin Time: 19.7 seconds — ABNORMAL HIGH (ref 11.4–15.2)
aPTT: 71 seconds — ABNORMAL HIGH (ref 24–36)

## 2021-06-02 LAB — BASIC METABOLIC PANEL
Anion gap: 15 (ref 5–15)
BUN: 34 mg/dL — ABNORMAL HIGH (ref 8–23)
CO2: 23 mmol/L (ref 22–32)
Calcium: 7.3 mg/dL — ABNORMAL LOW (ref 8.9–10.3)
Chloride: 95 mmol/L — ABNORMAL LOW (ref 98–111)
Creatinine, Ser: 3.3 mg/dL — ABNORMAL HIGH (ref 0.61–1.24)
GFR, Estimated: 18 mL/min — ABNORMAL LOW (ref >60)
Glucose, Bld: 89 mg/dL (ref 70–99)
Potassium: 4.7 mmol/L (ref 3.5–5.1)
Sodium: 133 mmol/L — ABNORMAL LOW (ref 135–145)

## 2021-06-02 LAB — PHOSPHORUS: Phosphorus: 4.3 mg/dL (ref 2.5–4.6)

## 2021-06-02 LAB — GLUCOSE, CAPILLARY
Glucose-Capillary: 81 mg/dL (ref 70–99)
Glucose-Capillary: 84 mg/dL (ref 70–99)
Glucose-Capillary: 95 mg/dL (ref 70–99)
Glucose-Capillary: 96 mg/dL (ref 70–99)
Glucose-Capillary: 99 mg/dL (ref 70–99)

## 2021-06-02 LAB — MAGNESIUM: Magnesium: 1.4 mg/dL — ABNORMAL LOW (ref 1.7–2.4)

## 2021-06-02 MED ORDER — MORPHINE SULFATE (PF) 2 MG/ML IV SOLN: 1.0000 mg | INTRAVENOUS | Status: DC | PRN

## 2021-06-02 MED ORDER — GLYCOPYRROLATE 0.2 MG/ML IJ SOLN: 0.2000 mg | INTRAMUSCULAR | Status: DC | PRN

## 2021-06-02 MED ORDER — MIDAZOLAM HCL 2 MG/2ML IJ SOLN: 2.0000 mg | INTRAMUSCULAR | Status: DC | PRN

## 2021-06-02 MED ORDER — ACETAMINOPHEN 325 MG PO TABS: 650.0000 mg | ORAL_TABLET | Freq: Four times a day (QID) | ORAL | Status: DC | PRN

## 2021-06-02 MED ORDER — HALOPERIDOL LACTATE 5 MG/ML IJ SOLN: 0.5000 mg | INTRAMUSCULAR | Status: DC | PRN

## 2021-06-02 MED ORDER — GLYCOPYRROLATE 1 MG PO TABS
1.0000 mg | ORAL_TABLET | ORAL | Status: DC | PRN
  Filled 2021-06-02: qty 1

## 2021-06-02 MED ORDER — POLYVINYL ALCOHOL 1.4 % OP SOLN
1.0000 [drp] | Freq: Four times a day (QID) | OPHTHALMIC | Status: DC | PRN
  Filled 2021-06-02: qty 15

## 2021-06-02 MED ORDER — HALOPERIDOL LACTATE 2 MG/ML PO CONC
0.5000 mg | ORAL | Status: DC | PRN
  Filled 2021-06-02: qty 0.3

## 2021-06-02 MED ORDER — MIDAZOLAM HCL 2 MG/2ML IJ SOLN
1.0000 mg | INTRAMUSCULAR | Status: DC | PRN
  Administered 2021-06-02: 1 mg via INTRAVENOUS
  Filled 2021-06-02: qty 2

## 2021-06-02 MED ORDER — MAGNESIUM SULFATE IN D5W 1-5 GM/100ML-% IV SOLN
1.0000 g | Freq: Once | INTRAVENOUS | Status: AC
  Administered 2021-06-02: 1 g via INTRAVENOUS
  Filled 2021-06-02: qty 100

## 2021-06-02 MED ORDER — MIDAZOLAM HCL 2 MG/2ML IJ SOLN: 1.0000 mg | INTRAMUSCULAR | Status: DC | PRN

## 2021-06-02 MED ORDER — HALOPERIDOL 0.5 MG PO TABS
0.5000 mg | ORAL_TABLET | ORAL | Status: DC | PRN
  Filled 2021-06-02: qty 1

## 2021-06-02 MED ORDER — ONDANSETRON 4 MG PO TBDP
4.0000 mg | ORAL_TABLET | Freq: Four times a day (QID) | ORAL | Status: DC | PRN
  Filled 2021-06-02: qty 1

## 2021-06-02 MED ORDER — ONDANSETRON HCL 4 MG/2ML IJ SOLN: 4.0000 mg | Freq: Four times a day (QID) | INTRAMUSCULAR | Status: DC | PRN

## 2021-06-02 MED ORDER — ACETAMINOPHEN 650 MG RE SUPP: 650.0000 mg | Freq: Four times a day (QID) | RECTAL | Status: DC | PRN

## 2021-06-02 MED ORDER — BIOTENE DRY MOUTH MT LIQD: 15.0000 mL | OROMUCOSAL | Status: DC | PRN

## 2021-06-02 DEATH — deceased

## 2021-06-30 NOTE — Progress Notes (Signed)
LTM EEG discontinued - no skin breakdown at unhook.   

## 2021-06-30 NOTE — Progress Notes (Signed)
NAME:  Natanel Angotti, MRN:  [id number], DOB:  04-09-1941, LOS: 2 ADMISSION DATE:  05/12/2021, CONSULTATION DATE:  05/27/2021  REFERRING MD:  Alvino Chapel, CHIEF COMPLAINT:  cardiac arrest   History of Present Illness:  Mr. 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 Who was finishing dinner then lost consciousness suddenly.  According to his sister who lives with him and helps care for him, he was finishing diner when she stepped out of the kitchen.  She then heard him fall to the floor and found him unresponsive.  She was unable to turn him over to start cpr, and immediately called 911.  He was found to be in PEA per ED, king airway was placed.  Per verbal report from ED physician, patient underwent CPR by EMS, would intermittently achieve ROSC and then develop PEA again, total of about 45 min of arrest/rosc period, unk exact amount of time coding.  Was transcutaneously paced for a time as well.    In ED, king airway was removed, patient was intubated without paralytic or sedation.  Food debris seen in the airway.  PEA arrest developed shortly after, Rosc achieved.   Again developed PEA several minutes later, started on  levophed gtt and epi infusion.   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  Spinal stenosis (recently admitted, not surgical candidate, treated with steroids).   Cholelithiasis GERD  Schizoaffective d/o (vs  schizophrenia per chart) Hx SBO   Takes plavix unk reason Meds:  albuterol, tylenol, plavix, norvasc, doxazosin, finasteride, fluoxetine, fluticasone, losartan, melatonin, remeron, folic acid, norco, mirabegron, pravastatin, spiriva.  Significant Hospital Events: Including procedures, antibiotic start and stop dates  in addition to other pertinent events   1/30 Brought into the ED with Pea arrest, significant down time, started on Epinephrine and Levophed 1/31 unresponsive off sedation, requiring high dose vaso, Epi and Levophed 2/1: remains on pressors and intubated; unresponsive  Interim History / Subjective:  Remains intubated on mech vent No change in neuro status; off sedation; unresponsvie; fixed pupils non reactive to light; absent reflexes to painful stimuli; no cough/gag reflex On Vaso, levo, and epi  Objective   Blood pressure (!) 88/68, pulse 89, temperature 98.2 F (36.8 C), resp. rate (!) 0, weight 89.5 kg, SpO2 96 %.    Vent Mode: PRVC FiO2 (%):  [0.7 %-60 %] 40 % Set Rate:  [30 bmp] 30 bmp Vt Set:  [560 mL] 560 mL PEEP:  [5 cmH20] 5 cmH20 Plateau Pressure:  [16 cmH20-21 cmH20] 18 cmH20   Intake/Output Summary (Last 24 hours) at 2021/06/06 0753 Last data filed at June 06, 2021 0557 Gross per 24 hour  Intake 4116.93 ml  Output 100 ml  Net 4016.93 ml    Filed Weights   06/01/21 0419 06-06-21 0328  Weight: 86.2 kg 89.5 kg    General:  critically ill appearing on mech vent HEENT: MM pink/moist; ETT in place Neuro: off sedation; unresponsvie; fixed pupils non reactive to light; absent reflexes to painful stimuli; no cough/gag reflex CV: s1s2, no m/r/g PULM:  dim clear BS bilaterally; on mech vent PRVC GI: soft, bsx4 active  Extremities: warm/dry, no edema  Skin: no rashes or lesions    Resolved Hospital Problem list  Assessment & Plan:   Out of hospital PEA arrest Shock Lactic acidosis Hypoxic Respiratory Failure  Hypokalemia Hypomagnesemia With prolonged ACLS ~45 minutes and unknown down time prior to EMS arrival, repeat code in the ED [facility] without acute findings  Most likely 2/2 hypoxia due to choking, given history and findings on intubation.  P: -continue mech vent PRVC 6-8 cc/kg -wean fio2 for sats >92% -VAP prevention in place -TTM normothermia protocol in  place -continue to wean levo, epi, vaso for MAP goal >65 -EEG in place -mag repleted this am; trend mag/bmp and replete as needed -Patient in DNR; will attempt to contact family to address GOC  Thrombocytopenia P: -trend CBC  Acute Kidney Injury Likely ATN in the setting of shock P: -Trend BMP / urinary output -Replace electrolytes as indicated -Avoid nephrotoxic agents, ensure adequate renal perfusion  COPD on spiriva per chart.   P: -scheduled duonebs   Schizoaffective d/o P: -continue fluoxetine. Remeron  GERD  P: -PPI  HTN P: -hold home anti-hypertensive's while in shock  HLD P: -continue statin  BPH P: -finasteride and mirabegron on hold -trend UOP   Best Practice (right click and "Reselect all SmartList Selections" daily)   Diet/type: NPO DVT prophylaxis: other start heparin tomorrow  GI prophylaxis: PPI Lines: Central line Foley:  N/A Code Status:  DNR Last date of multidisciplinary goals of care discussion [1/31Sister updated via phone, coming into the hospital, continue supportive care; made DNR]  Labs   CBC: Recent Labs  Lab 05/28/2021 2026 05/04/2021 2045 06/01/21 0152 06/01/21 0549 06/01/21 0552 06/01/21 2110 06/05/2021 0305 06/05/2021 0607  WBC 10.2  --   --  17.8*  --   --  5.9  --   HGB 10.5*   < > 12.6* 11.6* 11.6* 11.2* 11.0*  --   HCT 35.3*   < > 37.0* 34.7* 34.0* 33.0* 33.0*  --   MCV 94.4  --   --  86.1  --   --  84.2  --   PLT 97*  --   --  91*  --   --  51* 46*   < > = values in this interval not displayed.     Basic Metabolic Panel: Recent Labs  Lab 05/06/2021 2026 05/23/2021 2045 05/15/2021 2050 06/01/21 0549 06/01/21 0552 06/01/21 1616 06/01/21 2110 05-Jun-2021 0305  NA 131* 134*   < > 135 136 135 136 133*  K 3.9 4.1   < > 2.9* 2.9* 3.6 4.0 4.7  CL 97* 99  --  100  --  101  --  95*  CO2 19*  --   --  14*  --  23  --  23  GLUCOSE 276* 254*  --  232*  --  96  --  89  BUN 14 15  --  24*  --  30*  --  34*  CREATININE  1.75* 1.60*  --  2.28*  --  2.62*  --  3.30*  CALCIUM 7.9*  --   --  8.1*  --  7.8*  --  7.3*  MG  --   --   --  1.3*  --  1.7  --  1.4*  PHOS  --   --   --  3.8  --   --   --  4.3   < > = values in this interval not displayed.    GFR: Estimated Creatinine Clearance: 19.1 mL/min (A) (by C-G formula based on SCr of 3.3 mg/dL (  H)). Recent Labs  Lab 05/12/2021 2026 05/18/2021 2300 06/01/21 0110 06/01/21 0549 06/01/21 0942 June 09, 2021 0305  WBC 10.2  --   --  17.8*  --  5.9  LATICACIDVEN  --  >9.0* >9.0*  --  >9.0*  --      Liver Function Tests: Recent Labs  Lab 06/01/2021 2050  AST 66*  ALT 42  ALKPHOS 153*  BILITOT 0.7  PROT 5.1*  ALBUMIN 2.5*    No results for input(s): LIPASE, AMYLASE in the last 168 hours. No results for input(s): AMMONIA in the last 168 hours.  ABG    Component Value Date/Time   PHART 7.504 (H) 06/01/2021 2110   PCO2ART 32.2 06/01/2021 2110   PO2ART 147 (H) 06/01/2021 2110   HCO3 25.4 06/01/2021 2110   TCO2 26 06/01/2021 2110   ACIDBASEDEF 10.0 (H) 06/01/2021 0552   O2SAT 99.0 06/01/2021 2110      Coagulation Profile: Recent Labs  Lab 05/28/2021 2204 06-09-2021 0607  INR 1.5* 1.7*     Cardiac Enzymes: No results for input(s): CKTOTAL, CKMB, CKMBINDEX, TROPONINI in the last 168 hours.  HbA1C: Hgb A1c MFr Bld  Date/Time Value Ref Range Status  06/01/2021 05:49 AM 5.4 4.8 - 5.6 % Final    Comment:    (NOTE) Pre diabetes:          5.7%-6.4%  Diabetes:              >6.4%  Glycemic control for   <7.0% adults with diabetes   05/24/2017 03:07 AM 5.3 4.8 - 5.6 % Final    Comment:    (NOTE) Pre diabetes:          5.7%-6.4% Diabetes:              >6.4% Glycemic control for   <7.0% adults with diabetes     CBG: Recent Labs  Lab 06/01/21 1934 06/01/21 2106 2021-06-09 0019 06-09-21 0307 06-09-2021 0407  GLUCAP 77 86 81 95 84     Review of Systems:   Unable to assess  Past Medical History:  He,  has a past medical history of  Cholelithiasis, Fall (07/22/2015), GERD (gastroesophageal reflux disease), H/O urinary retention, Hypercholesteremia, Hypertension, Nephrolithiasis, Paget's disease of bone, Schizophrenia ([place]), Shortness of breath dyspnea, and 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   Surgical History:   Past Surgical History:  Procedure Laterality Date   BACK SURGERY     IR LUMBAR DISC ASPIRATION W/IMG GUIDE  04/13/2017   LIVER SURGERY     PELVIC FRACTURE SURGERY     TOTAL HIP ARTHROPLASTY       Social History:   reports that he quit smoking about 21 years ago. His smoking use included cigarettes. He smoked an average of 1 pack per day. He has never used smokeless tobacco. He reports that he does not drink alcohol and does not use drugs.   Family History:  His family history includes Cancer in his father and mother.   Allergies Allergies  Allergen Reactions   Gabapentin     Other reaction(s): Delirium   Penicillins     Unknown Has patient had a PCN reaction causing immediate rash, facial/tongue/throat swelling, SOB or lightheadedness with hypotension: NO Has patient had a PCN reaction causing severe rash involving mucus membranes or skin necrosis: NO Has patient had a PCN reaction that required hospitalization NO Has patient had a PCN reaction occurring within the last 10 years: NO If all of the above answers are "NO",  then may proceed with Cephalosporin use.   Tramadol     Other reaction(s): Delirium   Aspirin Rash     Home Medications  Prior to Admission medications   Medication Sig Start Date End Date Taking? Authorizing Provider  acetaminophen (TYLENOL) 500 MG tablet Take 500 mg by mouth daily.    [provider]  albuterol (PROVENTIL HFA;VENTOLIN HFA) 108 (90 BASE) MCG/ACT inhaler Inhale 2 puffs into the lungs every 4 (four) hours as needed for wheezing or shortness of breath.    [provider]  amLODipine (NORVASC) 5 MG tablet Take 2 tablets (10 mg total) by  mouth daily. 05/10/21   Bonnielee Haff, MD  atorvastatin (LIPITOR) 40 MG tablet Take 1 tablet (40 mg total) by mouth daily at 6 PM. 05/10/21   Bonnielee Haff, MD  clopidogrel (PLAVIX) 75 MG tablet Take 75 mg by mouth daily with breakfast.    [provider]  dexamethasone (DECADRON) 2 MG tablet Take 2 tablets twice daily for 7 days, then take 2 tablets once daily for 1 week, then take 1 tablet once daily for 1 week, then STOP 05/10/21   Bonnielee Haff, MD  dorzolamidel-timolol (COSOPT) 22.3-6.8 MG/ML SOLN ophthalmic solution Place 1 drop into both eyes 2 (two) times daily.    [provider]  doxazosin (CARDURA) 8 MG tablet Take 4 mg by mouth at bedtime.    [provider]  finasteride (PROSCAR) 5 MG tablet Take 5 mg by mouth daily.    [provider]  FLUoxetine (PROZAC) 20 MG capsule Take 40 mg by mouth daily.    [provider]  fluticasone (FLONASE) 50 MCG/ACT nasal spray Place 2 sprays into both nostrils daily.    [provider]  hydrochlorothiazide (HYDRODIURIL) 25 MG tablet Take 25 mg by mouth daily.    [provider]  losartan (COZAAR) 100 MG tablet Take 100 mg by mouth at bedtime.    [provider]  meloxicam (MOBIC) 15 MG tablet Take 15 mg by mouth daily.    [provider]  methocarbamol (ROBAXIN) 500 MG tablet Take 1 tablet (500 mg total) by mouth every 8 (eight) hours as needed for muscle spasms. 05/10/21   Bonnielee Haff, MD  mirtazapine (REMERON) 30 MG tablet Take 30 mg by mouth at bedtime.    [provider]  oxyCODONE (OXY IR/ROXICODONE) 5 MG immediate release tablet Take 1 tablet (5 mg total) by mouth every 6 (six) hours as needed for severe pain. 05/10/21   Bonnielee Haff, MD  paliperidone (INVEGA) 3 MG 24 hr tablet Take 3 mg by mouth daily.    [provider]  pantoprazole (PROTONIX) 40 MG tablet Take 40 mg by mouth 2 (two) times daily.    [provider]  polyethylene glycol  (MIRALAX / GLYCOLAX) packet Take 17 g by mouth daily.    [provider]  potassium chloride (KLOR-CON) 20 MEQ packet Take 20 mEq by mouth every other day.    [provider]  pregabalin (LYRICA) 25 MG capsule Take 25 mg by mouth at bedtime.    [provider]  senna (SENOKOT) 8.6 MG TABS tablet Take 2 tablets by mouth every other day.    [provider]  Vitamin D, Ergocalciferol, (DRISDOL) 1.25 MG (50000 UNIT) CAPS capsule Take 50,000 Units by mouth every 7 (seven) days. Wednesdays    [provider]     Critical care time: 35 minutes      JD Rollene Rotunda, PA-C  [facility] Jun 17, 2021, 8:21 AM  Please see [email or web address] for pager details.  From 7A-7P if no response, please call [phone]. After hours, please call ELink [phone].

## 2021-06-30 NOTE — Care Plan (Signed)
GOALS OF CARE DISCUSSION   The Clinical status was relayed to patient's sister and his niece at bedside.  In detail.   Updated and notified of patients medical condition.     Patient remains unresponsive and will not open eyes to command.   Explained to family course of therapy and the modalities  Signs of brain damage,  Evidence of kidney failure and respiratory failure   Patient with Progressive multiorgan failure with a very high probablity of a very minimal chance of meaningful recovery despite all aggressive and optimal medical therapy.  Patient's family decided to proceed with comfort care and palliative extubation while keeping him DNR per patient's stated wishes in the past.     Family are satisfied with Plan of action and management. All questions answered   Comfort measure orders were written, vasopressors were stopped.  Patient is comfortable and family is at bedside    Cheri Fowler MD [facility] See Amion for pager If no response to pager, please call [phone] until 7pm After 7pm, Please call E-link [phone]

## 2021-06-30 NOTE — Progress Notes (Signed)
eLink Physician-Brief Progress Note Patient Name: Norman Davis DOB: 06-02-1940 MRN: [id number]   Date of Service  06/15/2021  HPI/Events of Note  Multiple issues: 1. Hypomagnesemia - Mg++ = 1.4 and Creatinine = 3.30 2. Thrombocytopenia - Platelets = 91 --> 51. Does not appear to be on Heparin or Lovenox.   eICU Interventions  Plan: Will replace Mg++. DIC panel STAT.     Intervention Category Major Interventions: Electrolyte abnormality - evaluation and management;Other:  Resa Rinks Dennard Nip 06/17/2021, 5:22 AM

## 2021-06-30 NOTE — Death Summary Note (Signed)
DEATH SUMMARY   Patient Details  Name: Norman Davis MRN: [id number] DOB: 09-21-1940  Admission/Discharge Information   Admit Date:  02-Jun-2021  Date of Death: Date of Death: 04-Jun-2021  Time of Death: Time of Death: 08-11-30  Length of Stay: 2  Referring Physician: [facility]   Reason(s) for Hospitalization  Out of hospital PEA cardiac arrest with prolonged downtime Severe anoxic brain injury Mixed cardiogenic/septic shock Lactic acidosis Acute hypoxic respiratory failure Hypokalemia Hypomagnesemia Hyponatremia Thrombocytopenia Acute kidney injury  Diagnoses  Preliminary cause of death:   Withdrawal of care due to severe anoxic brain injury caused by out of hospital PEA cardiac arrest Secondary Diagnoses (including complications and co-morbidities):  Principal Problem:   Cardiac arrest Norman Davis) Active Problems:   Thrombocytopenia ([place])   Hypomagnesemia   Shock [facility])   Brief Hospital Course (including significant findings, care, treatment, and services provided and events leading to death)  Norman Davis is a 81 y.o. 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 Who was finishing dinner then lost consciousness suddenly.  According to his sister who lives with him and helps care for him, he was finishing diner when she stepped out of the kitchen.  She then heard him fall to the floor and found him unresponsive.  She was unable to turn him over to start cpr, and immediately called 911.  He was found to be in PEA per ED, king airway was placed.  Per verbal report from ED physician, patient underwent CPR by EMS, would intermittently achieve ROSC and then develop PEA again, total of about 45 min of arrest/rosc period, unk exact amount of time coding.  Was transcutaneously paced for a time as well.     In ED, king airway was removed,  patient was intubated without paralytic or sedation.  Food debris seen in the airway.  PEA arrest developed shortly after, Rosc achieved.   Again developed PEA several minutes later, started on  levophed gtt and epi infusion.   Patient was admitted to ICU, he was continued with Levophed and epinephrine infusion, his neurological exam remained poor with absent cough, gag, pupillary and corneal reflexes, possible cause of shock was cardiogenic with combination of septic.  He was continued on broad-spectrum antibiotics considering possible aspiration pneumonia.  Electrolytes were aggressively supplemented Patient neurological exam did not change even with full scope of care.  EEG was done which was negative for seizure.  Patient did show signs of post arrest myoclonus without EEG correlates.  He went into multiorgan failure including acute hypoxic respiratory failure, cardiogenic shock, septic shock, acute renal failure, shock liver.  Goals of care discussions were carried, patient's sister and niece suggested that patient would not want to live on machines with extraordinary measures and considering severe anoxic brain injury they would like to proceed with palliative extubation and focus on comfort care rather than aggressive measures.  Patient was made DNR per family request, comfort care orders were done, vasopressors were stopped and patient was declared dead on 06-04-2021 at 2:32 PM.  Patient's family was at bedside    Pertinent Labs and Studies  Significant Diagnostic Studies DG Chest 1 View  Result Date: 05/04/2021 CLINICAL DATA:  [place] getting out of bed.  Shortness of breath. EXAM: CHEST  1 VIEW COMPARISON:  04/19/2017.  04/16/2017. FINDINGS: Mild cardiomegaly. Aortic atherosclerosis and tortuosity.  Left lung is clear. Some emphysematous changes at the left apex. More extensive emphysema within the right lung. Increased markings in the right lung could reflect a right pneumonia. Consider two-view  chest radiography when possible. IMPRESSION: Underlying emphysema, worse on the right than the left. Increased markings in the right lung neck could indicate early pneumonia. Consider two-view chest radiography when able. Electronically Signed   By: Nelson Chimes M.D.   On: 05/04/2021 13:01   DG Chest 2 View  Result Date: 05/04/2021 CLINICAL DATA:  Shortness of breath. EXAM: CHEST - 2 VIEW COMPARISON:  Chest x-ray 04/16/2017.  Chest CT 04/14/2017 FINDINGS: Emphysematous changes are again seen throughout both lungs. There is linear atelectasis or scarring in the left lung base. The lungs are otherwise clear. There is no pleural effusion or pneumothorax. The aorta is tortuous and the heart is mildly enlarged, unchanged. No acute fractures are seen. IVC filter is present. IMPRESSION: 1. No acute cardiopulmonary process. 2.  Emphysema (ICD10-J43.9). Electronically Signed   By: Ronney Asters M.D.   On: 05/04/2021 21:02   DG Abd 1 View  Result Date: 06/01/2021 CLINICAL DATA:  NG tube placement. EXAM: ABDOMEN - 1 VIEW COMPARISON:  06/01/2021. FINDINGS: The bowel gas pattern is normal. Gaseous distention of the stomach is noted. The enteric tube terminates in the region of the distal esophagus, unchanged from the prior exam. No radio-opaque calculi or other significant radiographic abnormality are seen. Lumbar spinal fusion hardware is noted. An IVC filter is present to the right of midline. IMPRESSION: The enteric tube terminates in the region of the distal esophagus, unchanged from the prior exam. Electronically Signed   By: Brett Fairy M.D.   On: 06/01/2021 04:21   DG Abd 1 View  Result Date: 06/01/2021 CLINICAL DATA:  OG tube placement. EXAM: ABDOMEN - 1 VIEW COMPARISON:  06/01/2021. FINDINGS: The bowel gas pattern is normal. An enteric tube terminates in the region of the distal esophagus and should be advanced approximately 10 cm. No radio-opaque calculi or other significant radiographic abnormality are  seen. An IVC filter is noted to the right of midline. Lumbar spinal fusion hardware is present. IMPRESSION: The OG tube terminates in the region of the distal esophagus, not significantly changed from the prior exam. The tube should be advanced approximately 10 cm. Electronically Signed   By: Brett Fairy M.D.   On: 06/01/2021 01:56   DG Abd 1 View  Addendum Date: 06/01/2021   ADDENDUM REPORT: 06/01/2021 01:09 ADDENDUM: These results were called by telephone at the time of interpretation on 06/01/2021 at 12:28 am to provider [facility] , who verbally acknowledged these results. Electronically Signed   By: Ronney Asters M.D.   On: 06/01/2021 01:09   Result Date: 06/01/2021 CLINICAL DATA:  Orogastric tube placement. EXAM: ABDOMEN - 1 VIEW COMPARISON:  Abdominal x-ray 05/23/2017. CT abdomen and pelvis 05/12/2018. FINDINGS: Orogastric tube tip is at the level of the distal esophagus. The stomach is dilated and air-filled. IVC filter is unchanged in location in position in the right abdomen. Orthopedic hardware is partially visualized in the left iliac bone and lower lumbar spine. IMPRESSION: 1. Orogastric tube tip at the level of the distal esophagus. Recommend advancing tube. Electronically Signed: By: Ronney Asters M.D. On: 06/01/2021 00:46   CT HEAD WO CONTRAST (5MM)  Result Date: 05/07/2021 CLINICAL DATA:  Head trauma, minor (Age >= 65y); Neck trauma (Age >= 65y). Collapse. CPR EXAM: CT HEAD WITHOUT CONTRAST CT CERVICAL SPINE WITHOUT CONTRAST  TECHNIQUE: Multidetector CT imaging of the head and cervical spine was performed following the standard protocol without intravenous contrast. Multiplanar CT image reconstructions of the cervical spine were also generated. RADIATION DOSE REDUCTION: This exam was performed according to the departmental dose-optimization program which includes automated exposure control, adjustment of the mA and/or kV according to patient size and/or use of iterative reconstruction  technique. COMPARISON:  None. FINDINGS: CT HEAD FINDINGS BRAIN: BRAIN Mild patchy and confluent areas of decreased attenuation are noted throughout the deep and periventricular white matter of the cerebral hemispheres bilaterally, compatible with chronic microvascular ischemic disease. No evidence of large-territorial acute infarction. No parenchymal hemorrhage. No mass lesion. No extra-axial collection. No mass effect or midline shift. No hydrocephalus. Basilar cisterns are patent. Vascular: No hyperdense vessel. Atherosclerotic calcifications are present within the cavernous internal carotid and vertebral arteries. Skull: No acute fracture or focal lesion. Sinuses/Orbits: Left ethmoid and frontal mucosal thickening. Paranasal sinuses and mastoid air cells are clear. Left lens replacement. Otherwise the orbits are unremarkable. Other: None. CT CERVICAL SPINE FINDINGS Alignment: Normal. Skull base and vertebrae: Multilevel severe degenerative changes of the spine. No acute fracture. No aggressive appearing focal osseous lesion or focal pathologic process. Soft tissues and spinal canal: No prevertebral fluid or swelling. No visible canal hematoma. Upper chest: Bilateral pulmonary apices demonstrate gas. Other: Debris within the oro and hypopharynx. Endotracheal tube and enteric tubes partially visualized with endotracheal tube tore seeing coursing within the trachea and enteric tube coursing within the esophagus. IMPRESSION: 1. No acute intracranial abnormality. 2. No acute displaced fracture or traumatic listhesis of the cervical spine. 3. Bilateral pulmonary apices demonstrate gas with pneumothoraces not excluded in the setting of previously described bullous changes. Consider repeat chest x-ray. Electronically Signed   By: Iven Finn M.D.   On: 05/07/2021 22:51   CT Head Wo Contrast  Result Date: 05/04/2021 CLINICAL DATA:  Mental status change, unknown cause. Additional history provided: Weakness in legs,  shortness of breath. EXAM: CT HEAD WITHOUT CONTRAST TECHNIQUE: Contiguous axial images were obtained from the base of the skull through the vertex without intravenous contrast. COMPARISON:  MRI brain and MRA head 05/24/2017. head CT 03/20/2018. FINDINGS: Brain: Mild generalized cerebral atrophy. Small lacunar infarct within the right caudate head, new as compared to the head CT of 03/20/2018 but otherwise age indeterminate (series 3, image 39). 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 Background moderately advanced patchy and ill-defined hypoattenuation within the cerebral white matter, nonspecific but compatible with chronic small vessel ischemic disease. There is no acute intracranial hemorrhage. No demarcated cortical infarct. No extra-axial fluid collection. No evidence of an intracranial mass. No midline shift. Vascular: No hyperdense vessel.  Atherosclerotic calcifications. Skull: Normal. Negative for fracture or focal lesion. Sinuses/Orbits: Visualized orbits show no acute finding. Complete opacification of the left frontal sinus. Frothy secretions within a posterior left ethmoid air cell. IMPRESSION: No acute intracranial hemorrhage or acute demarcated cortical infarction. Small lacunar infarct within the right basal ganglia, new from the prior head CT of 03/20/2018 but otherwise age-indeterminate. A brain MRI may be obtained for further evaluation, as clinically warranted. Moderately advanced chronic small vessel ischemic disease, as described. Mild generalized cerebral atrophy. Paranasal sinus disease, as outlined. Electronically Signed   By: Kellie Simmering D.O.   On: 05/04/2021 12:52   CT Cervical Spine Wo Contrast  Result Date: 05/04/2021 CLINICAL DATA:  Head trauma, minor (Age >= 65y); Neck trauma (Age >= 65y). Collapse. CPR  EXAM: CT HEAD WITHOUT CONTRAST CT CERVICAL SPINE WITHOUT CONTRAST TECHNIQUE: Multidetector CT imaging of  the head and cervical spine was performed following the standard protocol without intravenous contrast. Multiplanar CT image reconstructions of the cervical spine were also generated. RADIATION DOSE REDUCTION: This exam was performed according to the departmental dose-optimization program which includes automated exposure control, adjustment of the mA and/or kV according to patient size and/or use of iterative reconstruction technique. COMPARISON:  None. FINDINGS: CT HEAD FINDINGS BRAIN: BRAIN Mild patchy and confluent areas of decreased attenuation are noted throughout the deep and periventricular white matter of the cerebral hemispheres bilaterally, compatible with chronic microvascular ischemic disease. No evidence of large-territorial acute infarction. No parenchymal hemorrhage. No mass lesion. No extra-axial collection. No mass effect or midline shift. No hydrocephalus. Basilar cisterns are patent. Vascular: No hyperdense vessel. Atherosclerotic calcifications are present within the cavernous internal carotid and vertebral arteries. Skull: No acute fracture or focal lesion. Sinuses/Orbits: Left ethmoid and frontal mucosal thickening. Paranasal sinuses and mastoid air cells are clear. Left lens replacement. Otherwise the orbits are unremarkable. Other: None. CT CERVICAL SPINE FINDINGS Alignment: Normal. Skull base and vertebrae: Multilevel severe degenerative changes of the spine. No acute fracture. No aggressive appearing focal osseous lesion or focal pathologic process. Soft tissues and spinal canal: No prevertebral fluid or swelling. No visible canal hematoma. Upper chest: Bilateral pulmonary apices demonstrate gas. Other: Debris within the oro and hypopharynx. Endotracheal tube and enteric tubes partially visualized with endotracheal tube tore seeing coursing within the trachea and enteric tube coursing within the esophagus. IMPRESSION: 1. No acute intracranial abnormality. 2. No acute displaced fracture or  traumatic listhesis of the cervical spine. 3. Bilateral pulmonary apices demonstrate gas with pneumothoraces not excluded in the setting of previously described bullous changes. Consider repeat chest x-ray. Electronically Signed   By: Iven Finn M.D.   On: 05/25/2021 22:51   MR BRAIN WO CONTRAST  Result Date: 05/04/2021 CLINICAL DATA:  Initial evaluation for neuro deficit, stroke suspected. EXAM: MRI HEAD WITHOUT CONTRAST TECHNIQUE: Multiplanar, multiecho pulse sequences of the brain and surrounding structures were obtained without intravenous contrast. COMPARISON:  CT from earlier the same day. FINDINGS: Brain: Generalized age-related cerebral atrophy. Patchy and confluent T2/FLAIR hyperintensity involving the periventricular and deep white matter both cerebral hemispheres as well as the pons, most consistent with chronic small vessel ischemic disease, moderately advanced in nature. Few scattered superimposed remote lacunar infarcts present about the hemispheric cerebral white matter, basal ganglia, and thalami. Few probable tiny remote cerebellar infarcts noted as well. No abnormal foci of restricted diffusion to suggest acute or subacute ischemia. Gray-white matter differentiation maintained. No encephalomalacia to suggest chronic cortical infarction. No evidence for acute or chronic intracranial hemorrhage. No mass lesion, midline shift or mass effect. Mild ventricular prominence related to global parenchymal volume loss of hydrocephalus. No extra-axial fluid collection. Pituitary gland suprasellar region within normal limits. Midline structures intact. Vascular: Major intracranial vascular flow voids are maintained. Major intracranial arterial vasculature appears somewhat dolichoectatic in nature. Skull and upper cervical spine: Craniocervical junction within normal limits. Bone marrow signal intensity diffusely heterogeneous without focal marrow replacing lesion. No scalp soft tissue abnormality.  Sinuses/Orbits: Prior ocular lens replacement noted on the left. Globes and orbital soft tissues demonstrate no acute finding. Chronic left frontal and ethmoidal sinusitis noted. Small right mastoid effusion noted, of doubtful significance. Visualized nasopharynx unremarkable. Other: None. IMPRESSION: 1. No acute intracranial abnormality. 2. Age-related cerebral atrophy with moderately advanced chronic microvascular ischemic disease. 3.  Chronic left frontal and ethmoidal sinusitis. Electronically Signed   By: Jeannine Boga M.D.   On: 05/04/2021 20:13   MR THORACIC SPINE W WO CONTRAST  Result Date: 05/04/2021 CLINICAL DATA:  Initial evaluation for mid back pain, lower extremity weakness. EXAM: MRI THORACIC WITHOUT AND WITH CONTRAST TECHNIQUE: Multiplanar and multiecho pulse sequences of the thoracic spine were obtained without and with intravenous contrast. CONTRAST:  72mL GADAVIST GADOBUTROL 1 MMOL/ML IV SOLN COMPARISON:  Previous MRI from 05/12/2018. FINDINGS: Alignment: Physiologic with preservation of the normal thoracic kyphosis. No listhesis. Vertebrae: Chronic compression fracture involving the T12 vertebral body with up to approximate 50% height loss and trace 2 mm bony retropulsion. Otherwise, vertebral body height maintained with no other acute or subacute fracture. Bone marrow signal intensity mildly heterogeneous but overall within normal limits. No worrisome osseous lesions. No abnormal marrow edema or enhancement. Cord:  Normal signal morphology.  No abnormal enhancement. Paraspinal and other soft tissues: Paraspinous soft tissues demonstrate no acute finding. Small layering bilateral pleural effusions with associated atelectasis noted. Disc levels: T9-10: Negative interspace. Bilateral facet hypertrophy. No spinal stenosis. Mild left greater than right foraminal stenosis. T10-11: Degenerative disc bulge with bilateral facet hypertrophy. No significant spinal stenosis. Moderate bilateral  foraminal narrowing. Otherwise, no other significant disc pathology seen within the thoracic spine for age. No spinal stenosis. Foramina otherwise remain patent. IMPRESSION: 1. No acute abnormality within the thoracic spine. No findings to explain patient's symptoms. 2. Chronic compression fracture involving the T12 vertebral body with up to 50% height loss and trace 2 mm bony retropulsion. 3. Mild to moderate bilateral foraminal stenosis at T9-10 and T10-11, largely due to facet hypertrophy. No significant spinal stenosis within the thoracic spine. 4. Small layering bilateral pleural effusions with associated atelectasis. Electronically Signed   By: Jeannine Boga M.D.   On: 05/04/2021 20:23   MR Lumbar Spine W Wo Contrast  Result Date: 05/04/2021 CLINICAL DATA:  Initial evaluation for low back pain, prior surgery, lower extremity weakness. EXAM: MRI LUMBAR SPINE WITHOUT AND WITH CONTRAST TECHNIQUE: Multiplanar and multiecho pulse sequences of the lumbar spine were obtained without and with intravenous contrast. CONTRAST:  64mL GADAVIST GADOBUTROL 1 MMOL/ML IV SOLN COMPARISON:  Previous MRI from 05/12/2018. FINDINGS: Segmentation: Standard. Same numbering system employed as on previous exam. Alignment: Chronic 4 mm anterolisthesis of L4 on L5. Trace retrolisthesis of L1 on L2, L2 on L3, L3 on L4. Alignment otherwise normal with preservation of the normal lumbar lordosis. Vertebrae: Susceptibility artifact from prior PLIF at L5-S1. Postoperative changes noted about the partially visualized left iliac wing and left SI joint. Chronic T12 compression fracture noted, better evaluated on concomitant MRI of the thoracic spine. Vertebral body height otherwise maintained with no other acute or chronic fracture. Bone marrow signal intensity diffusely heterogeneous. No worrisome osseous lesions. No MRA evidence for active osteomyelitis discitis within the lumbar spine. Reactive marrow edema and enhancement about the  left aspect of the L1-2 interspace felt to be degenerative in nature, with no significant signal abnormality within the intervening L1-2 interspace. 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 Conus medullaris and cauda equina: Conus extends to the L1 level. Conus medullaris within normal limits. Nerve roots of the cauda equina are somewhat irregular and undulating related to distal stenosis. Paraspinal and other soft tissues: Chronic postoperative changes present within the posterior paraspinous soft tissues. Small benign appearing cyst partially visualize within the interpolar left kidney. Visualized visceral structures otherwise unremarkable. Disc levels:  L1-2: Disc bulge with disc desiccation and intervertebral disc space narrowing. Disc bulging asymmetric to the left. Prominent reactive endplate change, also worse on the left. Superimposed left foraminal to extraforaminal disc protrusion closely approximates the exiting left L1 nerve root (series 26, image 14). Mild bilateral facet hypertrophy. Resultant mild canal with moderate left lateral recess stenosis. Mild right with moderate left L1 foraminal narrowing. L2-3: Disc desiccation with mild disc bulge. Disc bulging asymmetric to the left. Associated reactive endplate spurring. Moderate facet and ligament flavum hypertrophy. Resultant fairly severe spinal stenosis with the thecal sac measuring 7 mm in AP diameter at its most narrow point. Mild right with moderate left L2 foraminal stenosis. L3-4: Severe degenerative intervertebral disc space narrowing with ankylosis, progressed from prior, likely combination of degenerative changes in sequelae of prior osteomyelitis discitis. Associated reactive endplate change with marginal endplate osteophytic spurring. Moderate facet hypertrophy with ankylosis of the L3-4 facets as well. Resultant severe spinal stenosis, with the thecal sac measuring 7 mm in AP diameter. Moderate bilateral L3  foraminal narrowing. L4-5: Advanced degenerative intervertebral disc space narrowing with diffuse disc bulge, disc desiccation, and reactive endplate spurring. Moderate bilateral facet hypertrophy. Resultant moderate canal with bilateral subarticular stenosis. Mild bilateral L4 foraminal narrowing. L5-S1: Prior PLIF. No residual spinal stenosis. Foramina remain patent. IMPRESSION: 1. No acute abnormality within the lumbar spine. 2. 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 No convincing evidence for active infection within the lumbar spine. 3. Multifactorial degenerative changes at L2-3 and L3-4 with resultant severe spinal stenosis, with moderate bilateral L3 foraminal narrowing. 4. Left eccentric disc osteophyte and facet hypertrophy at L1-2 with resultant moderate left lateral recess and foraminal stenosis. Reactive endplate edema and enhancement at this level favored to be degenerative. 5. Prior PLIF at L5-S1 without residual or recurrent stenosis. Electronically Signed   By: Jeannine Boga M.D.   On: 05/04/2021 20:44   US RENAL  Result Date: 05/04/2021 CLINICAL DATA:  Acute kidney injury. EXAM: RENAL / URINARY TRACT ULTRASOUND COMPLETE COMPARISON:  CT abdomen and pelvis 05/12/2018. FINDINGS: Right Kidney: Renal measurements: 10.3 x 3.8 x 5.0 cm = volume: 102 mL. Echogenicity within normal limits. No mass or hydronephrosis visualized. Left Kidney: Renal measurements: 11.2 x 5.7 x 4.3 cm = volume: 145 mL. Echogenicity within normal limits. No mass or hydronephrosis visualized. Bladder: Appears normal for degree of bladder distention. Other: None. IMPRESSION: Within normal limits. Electronically Signed   By: Ronney Asters M.D.   On: 05/04/2021 20:05   DG CHEST PORT 1 VIEW  Result Date: 06/01/2021 CLINICAL DATA:  Possible pneumothoraces EXAM: PORTABLE CHEST 1 VIEW COMPARISON:  CT cervical spine, 05/09/2021, CT chest, 04/14/2017 FINDINGS: Cardiomegaly.  Endotracheal intubation, endotracheal tube tip above the carina. Esophagogastric tube with tip below the diaphragm, side port above the gastroesophageal junction. The esophagus is very patulous or tortuous. Severe, bullous emphysema. No pneumothorax. The visualized skeletal structures are unremarkable. IMPRESSION: 1. Endotracheal intubation, endotracheal tube tip above the carina. 2. Esophagogastric tube with tip below the diaphragm, side port above the gastroesophageal junction. The esophagus is very patulous or tortuous. Recommend advancement to ensure subdiaphragmatic positioning 3. Severe, bullous emphysema. No pneumothorax. Electronically Signed   By: Delanna Ahmadi M.D.   On: 06/01/2021 11:17   DG Chest Portable 1 View  Result Date: 05/20/2021 CLINICAL DATA:  Cardiac arrest, intubated EXAM: PORTABLE CHEST 1 VIEW COMPARISON:  05/04/2021 FINDINGS: Single frontal view of the chest demonstrates endotracheal tube overlying tracheal air  column, tip midway between thoracic inlet and carina. Cardiac silhouette is stable. Continued ectasia of the thoracic aorta. 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 Increased vascular congestion and interstitial prominence consistent with mild edema. No effusion or pneumothorax. No acute bony abnormalities. IMPRESSION: 1. No complication after intubation. 2. 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 Electronically Signed   By: Randa Ngo M.D.   On: 05/23/2021 20:45   EEG adult  Result Date: 06/01/2021 Lora Havens, MD     06/01/2021  8:36 AM Patient Name: Norman Davis MRN: [id number] Epilepsy Attending: Lora Havens Referring Physician/Provider: Corey Harold, NP Date: 05/06/2021 Duration: 24.05 mins Patient history: 81 year old male status post cardiac arrest.  EEG to evaluate for seizure. Level of alertness: comatose AEDs during EEG study: None Technical aspects: This EEG study was done with scalp  electrodes positioned according to the 10-20 International system of electrode placement. Electrical activity was acquired at a sampling rate of 500Hz  and reviewed with a high frequency filter of 70Hz  and a low frequency filter of 1Hz . EEG data were recorded continuously and digitally stored. Description: EEG showed continuous generalized background suppression.  EEG was not reactive to tactile stimulation. Hyperventilation and photic stimulation were not performed.   ABNORMALITY -Background suppression, generalized IMPRESSION: This study is suggestive of profound diffuse encephalopathy, nonspecific etiology. No seizures or epileptiform discharges were seen throughout the recording. Priyanka Barbra Sarks   Overnight EEG with video  Result Date: 06/01/2021 Lora Havens, MD     06/04/2021  9:06 AM Patient Name: Norman Davis MRN: [id number] Epilepsy Attending: Lora Havens Referring Physician/Provider: Corey Harold, NP Duration: 06/01/2021 0148 to June 04, 2021 0148  Patient history: 81 year old male status post cardiac arrest.  EEG to evaluate for seizure.  Level of alertness: comatose  AEDs during EEG study: None  Technical aspects: This EEG study was done with scalp electrodes positioned according to the 10-20 International system of electrode placement. Electrical activity was acquired at a sampling rate of 500Hz  and reviewed with a high frequency filter of 70Hz  and a low frequency filter of 1Hz . EEG data were recorded continuously and digitally stored.  Description: EEG showed continuous generalized background suppression.  EEG was not reactive to tactile stimulation.  Hyperventilation and photic stimulation were not performed.    ABNORMALITY -Background suppression, generalized  IMPRESSION: This study is suggestive of profound diffuse encephalopathy, nonspecific etiology. No seizures or epileptiform discharges were seen throughout the recording.  Lora Havens    ECHOCARDIOGRAM COMPLETE  Result Date:  06/01/2021    ECHOCARDIOGRAM REPORT   Patient Name:   Norman Davis Date of Exam: 06/01/2021 Medical Rec #:  [id number]   Height:       69.0 in Accession #:    [id number]  Weight:       190.0 lb Date of Birth:  [birth year]   BSA:          2.022 m Patient Age:    32 years    BP:           102/69 mmHg Patient Gender: M           HR:           80 bpm. Exam Location:  Inpatient Procedure: 2D Echo, Cardiac Doppler, Color Doppler and Intracardiac            Opacification Agent Indications:    Cardiac arrest  History:        Patient  has prior history of Echocardiogram examinations, most                 recent 05/05/2021. COPD; Risk Factors:Dyslipidemia and                 Hypertension. GERD.  Sonographer:    Clayton Lefort RDCS (AE) Referring Phys: [id number] Silvestre Moment [facility]  Sonographer Comments: Technically challenging study due to limited acoustic windows, Technically difficult study due to poor echo windows, suboptimal parasternal window, suboptimal apical window, suboptimal subcostal window and echo performed with patient supine and on artificial respirator. Image acquisition challenging due to COPD. Technically challenging study due to poor sound wave transmission. IMPRESSIONS  1. Challenging images. Left ventricular ejection fraction, by estimation, is 60 to 65%. The left ventricle has normal function. The left ventricle has no regional wall motion abnormalities. Left ventricular diastolic parameters are consistent with Grade  I diastolic dysfunction (impaired relaxation).  2. Right ventricular systolic function is normal. The right ventricular size is normal.  3. The mitral valve is normal in structure. No evidence of mitral valve regurgitation. No evidence of mitral stenosis.  4. The aortic valve was not well visualized. Aortic valve regurgitation is not visualized. No aortic stenosis is present.  5. The inferior vena cava is normal in size with greater than 50% respiratory variability, suggesting right atrial pressure of 3 mmHg.  Comparison(s): Prior images reviewed side by side. FINDINGS  Left Ventricle: Challenging images. Left ventricular ejection fraction, by estimation, is 60 to 65%. The left ventricle has normal function. The left ventricle has no regional wall motion abnormalities. Definity contrast agent was given IV to delineate the left ventricular endocardial borders. The left ventricular internal cavity size was normal in size. There is no left ventricular hypertrophy. Abnormal (paradoxical) septal motion, consistent with left bundle branch block. Left ventricular diastolic parameters are consistent with Grade I diastolic dysfunction (impaired relaxation). Right Ventricle: The right ventricular size is normal. No increase in right ventricular wall thickness. Right ventricular systolic function is normal. Left Atrium: Left atrial size was normal in size. Right Atrium: Right atrial size was normal in size. Pericardium: There is no evidence of pericardial effusion. Mitral Valve: The mitral valve is normal in structure. No evidence of mitral valve regurgitation. No evidence of mitral valve stenosis. Tricuspid Valve: The tricuspid valve is normal in structure. Tricuspid valve regurgitation is not demonstrated. No evidence of tricuspid stenosis. Aortic Valve: The aortic valve was not well visualized. Aortic valve regurgitation is not visualized. No aortic stenosis is present. Aortic valve mean gradient measures 3.0 mmHg. Aortic valve peak gradient measures 5.6 mmHg. Aortic valve area, by VTI measures 2.63 cm. Pulmonic Valve: The pulmonic valve was normal in structure. Pulmonic valve regurgitation is not visualized. No evidence of pulmonic stenosis. Aorta: The aortic root is normal in size and structure. Venous: The inferior vena cava is normal in size with greater than 50% respiratory variability, suggesting right atrial pressure of 3 mmHg. IAS/Shunts: No atrial level shunt detected by color flow Doppler.  LEFT VENTRICLE PLAX 2D LVOT  diam:     2.10 cm   Diastology LV SV:         39        LV e' lateral:   10.70 cm/s LV SV Index:   20        LV E/e' lateral: 5.0 LVOT Area:     3.46 cm  RIGHT VENTRICLE RV S prime:     11.70 cm/s TAPSE (M-mode):  2.8 cm AORTIC VALVE AV Area (Vmax):    2.67 cm AV Area (Vmean):   2.56 cm AV Area (VTI):     2.63 cm AV Vmax:           118.00 cm/s AV Vmean:          78.900 cm/s AV VTI:            0.150 m AV Peak Grad:      5.6 mmHg AV Mean Grad:      3.0 mmHg LVOT Vmax:         90.80 cm/s LVOT Vmean:        58.400 cm/s LVOT VTI:          0.114 m LVOT/AV VTI ratio: 0.76  AORTA Ao Root diam: 3.70 cm Ao Asc diam:  3.70 cm MITRAL VALVE MV Area (PHT): 3.12 cm    SHUNTS MV Decel Time: 243 msec    Systemic VTI:  0.11 m MV E velocity: 54.00 cm/s  Systemic Diam: 2.10 cm MV A velocity: 73.90 cm/s MV E/A ratio:  0.73 Candee Furbish MD Electronically signed by Candee Furbish MD Signature Date/Time: 06/01/2021/11:45:52 AM    Final    ECHOCARDIOGRAM COMPLETE  Result Date: 05/05/2021    ECHOCARDIOGRAM REPORT   Patient Name:   Norman Davis Date of Exam: 05/05/2021 Medical Rec #:  [id number]   Height:       69.0 in Accession #:    [id number]  Weight:       160.0 lb Date of Birth:  30-Mar-1941   BSA:          1.879 m Patient Age:    29 years    BP:           132/89 mmHg Patient Gender: M           HR:           90 bpm. Exam Location:  Inpatient Procedure: 2D Echo, Cardiac Doppler and Color Doppler Indications:    stroke  History:        Patient has prior history of Echocardiogram examinations, most                 recent 04/13/2017. Signs/Symptoms:Shortness of Breath; Risk                 Factors:Hypertension and Dyslipidemia. Gerds.  Sonographer:    Beryle Beams Referring Phys: [id number] [place]  1. Left ventricular ejection fraction, by estimation, is 50 to 55%. The left ventricle has low normal function. The left ventricle has no regional wall motion abnormalities. There is mild asymmetric left ventricular hypertrophy of  the basal-septal segment. Left ventricular diastolic parameters are consistent with Grade I diastolic dysfunction (impaired relaxation).  2. Right ventricular systolic function is normal. The right ventricular size is normal. Tricuspid regurgitation signal is inadequate for assessing PA pressure.  3. The mitral valve is degenerative. Trivial mitral valve regurgitation. No evidence of mitral stenosis.  4. The aortic valve was not well visualized. Aortic valve regurgitation is not visualized. Aortic valve sclerosis is present, with no evidence of aortic valve stenosis.  5. Aortic dilatation noted. There is mild dilatation of the aortic root, measuring 38 mm. Comparison(s): Compared to prior TTE in 2018, there is no significant change. Conclusion(s)/Recommendation(s): No intracardiac source of embolism detected on this transthoracic study. Consider a transesophageal echocardiogram to exclude cardiac source of embolism if clinically indicated. FINDINGS  Left Ventricle: Left ventricular ejection fraction, by  estimation, is 50 to 55%. The left ventricle has low normal function. The left ventricle has no regional wall motion abnormalities. The left ventricular internal cavity size was normal in size. There is mild asymmetric left ventricular hypertrophy of the basal-septal segment. Left ventricular diastolic parameters are consistent with Grade I diastolic dysfunction (impaired relaxation). Right Ventricle: The right ventricular size is normal. Right vetricular wall thickness was not well visualized. Right ventricular systolic function is normal. Tricuspid regurgitation signal is inadequate for assessing PA pressure. Left Atrium: Left atrial size was normal in size. Right Atrium: Right atrial size was normal in size. Pericardium: There is no evidence of pericardial effusion. Mitral Valve: The mitral valve is degenerative in appearance. There is mild thickening of the mitral valve leaflet(s). There is mild calcification of  the mitral valve leaflet(s). Mild to moderate mitral annular calcification. Trivial mitral valve regurgitation. No evidence of mitral valve stenosis. Tricuspid Valve: The tricuspid valve is normal in structure. Tricuspid valve regurgitation is trivial. Aortic Valve: The aortic valve was not well visualized. Aortic valve regurgitation is not visualized. Aortic valve sclerosis is present, with no evidence of aortic valve stenosis. Aortic valve mean gradient measures 3.0 mmHg. Aortic valve peak gradient measures 5.4 mmHg. Aortic valve area, by VTI measures 2.13 cm. Pulmonic Valve: The pulmonic valve was not well visualized. Aorta: Aortic dilatation noted. There is mild dilatation of the aortic root, measuring 38 mm. Venous: The inferior vena cava was not well visualized. IAS/Shunts: The atrial septum is grossly normal.  LEFT VENTRICLE PLAX 2D LVIDd:         3.51 cm     Diastology LVIDs:         2.58 cm     LV e' medial:    6.53 cm/s LV PW:         1.12 cm     LV E/e' medial:  10.0 LV IVS:        0.89 cm     LV e' lateral:   9.57 cm/s LVOT diam:     2.20 cm     LV E/e' lateral: 6.8 LV SV:         49 LV SV Index:   26 LVOT Area:     3.80 cm  LV Volumes (MOD) LV vol d, MOD A2C: 92.6 ml LV vol s, MOD A2C: 39.6 ml LV vol s, MOD A4C: 35.5 ml LV SV MOD A2C:     53.0 ml RIGHT VENTRICLE RV S prime:     9.68 cm/s TAPSE (M-mode): 1.9 cm LEFT ATRIUM           Index        RIGHT ATRIUM           Index LA diam:      2.90 cm 1.54 cm/m   RA Area:     11.80 cm 6.28 cm/m LA Vol (A2C): 43.6 ml 23.20 ml/m LA Vol (A4C): 80.8 ml 43.00 ml/m  AORTIC VALVE                    PULMONIC VALVE AV Area (Vmax):    2.78 cm     RVOT Peak grad: 1 mmHg AV Area (Vmean):   2.65 cm AV Area (VTI):     2.13 cm AV Vmax:           116.00 cm/s AV Vmean:          77.100 cm/s AV VTI:  0.232 m AV Peak Grad:      5.4 mmHg AV Mean Grad:      3.0 mmHg LVOT Vmax:         84.90 cm/s LVOT Vmean:        53.800 cm/s LVOT VTI:          0.130 m LVOT/AV  VTI ratio: 0.56  AORTA Ao Root diam: 3.70 cm MITRAL VALVE MV Area (PHT): 5.27 cm    SHUNTS MV Decel Time: 144 msec    Systemic VTI:  0.13 m MV E velocity: 65.10 cm/s  Systemic Diam: 2.20 cm MV A velocity: 49.70 cm/s  Pulmonic VTI:  0.065 m MV E/A ratio:  1.31 Gwyndolyn Kaufman MD Electronically signed by Gwyndolyn Kaufman MD Signature Date/Time: 05/05/2021/1:03:34 PM    Final    DG Hip Unilat W or Wo Pelvis 2-3 Views Right  Result Date: 05/04/2021 CLINICAL DATA:  Shortness of breath.  [place] getting out of bed. EXAM: DG HIP (WITH OR WITHOUT PELVIS) 2-3V RIGHT COMPARISON:  None. 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 No evidence of acute fracture. Chronic joint space narrowing at both hip joints. Previous lumbosacral decompression and fusion and previous ORIF in the region of the left iliac bone. IVC filter is noted. IMPRESSION: No acute or traumatic finding. Chronic changes of Paget's disease. Old operative changes as above. Electronically Signed   By: Nelson Chimes M.D.   On: 05/04/2021 12:59   DG FEMUR MIN 2 VIEWS LEFT  Result Date: 05/04/2021 CLINICAL DATA:  Weakness. EXAM: LEFT FEMUR 2 VIEWS COMPARISON:  March 20, 2018. FINDINGS: There is no evidence of fracture or other focal bone lesions. Soft tissues are unremarkable. IMPRESSION: Negative. Electronically Signed   By: Marijo Conception M.D.   On: 05/04/2021 15:08   VAS US CAROTID  Result Date: 05/07/2021 Carotid Arterial Duplex Study Patient Name:  Norman Davis  Date of Exam:   05/05/2021 Medical Rec #: [id number]    Accession #:    [id number] Date of Birth: 23-Oct-1940    Patient Gender: M Patient Age:   72 years Exam Location:  [facility] Procedure:      VAS US CAROTID Referring Phys: Wynetta Fines --------------------------------------------------------------------------------  Indications:       CVA. Risk Factors:      Hypertension, hyperlipidemia. Comparison Study:  05/24/2017 - Right Carotid: Velocities in the right ICA are                     consistent with a 1-39% stenosis.                     Left Carotid: Velocities in the left ICA are consistent with                    a 1-39%                    stenosis.                     Vertebrals: Both vertebral arteries were patent with                    antegrade flow.                    Subclavians: Normal flow hemodynamics were seen in bilateral  subclavian arteries. Performing Technologist: Oliver Hum RVT  Examination Guidelines: A complete evaluation includes B-mode imaging, spectral Doppler, color Doppler, and power Doppler as needed of all accessible portions of each vessel. Bilateral testing is considered an integral part of a complete examination. Limited examinations for reoccurring indications may be performed as noted.  Right Carotid Findings: +----------+--------+--------+--------+-----------------------+--------+             PSV cm/s EDV cm/s Stenosis Plaque Description      Comments  +----------+--------+--------+--------+-----------------------+--------+  CCA Prox   88       28                smooth and heterogenous           +----------+--------+--------+--------+-----------------------+--------+  CCA Distal 55       16                smooth and heterogenous           +----------+--------+--------+--------+-----------------------+--------+  ICA Prox   37       16                smooth and heterogenous           +----------+--------+--------+--------+-----------------------+--------+  ICA Distal 51       18                                        tortuous  +----------+--------+--------+--------+-----------------------+--------+  ECA        30       5                                                   +----------+--------+--------+--------+-----------------------+--------+ +----------+--------+-------+--------+-------------------+             PSV cm/s EDV cms Describe Arm Pressure (mmHG)  +----------+--------+-------+--------+-------------------+   Subclavian 40                                             +----------+--------+-------+--------+-------------------+ +---------+--------+--+--------+--+---------+  Vertebral PSV cm/s 29 EDV cm/s 11 Antegrade  +---------+--------+--+--------+--+---------+  Left Carotid Findings: +----------+--------+--------+--------+-----------------------+--------+             PSV cm/s EDV cm/s Stenosis Plaque Description      Comments  +----------+--------+--------+--------+-----------------------+--------+  CCA Prox   81       22                smooth and heterogenous           +----------+--------+--------+--------+-----------------------+--------+  CCA Distal 52       19                smooth and heterogenous           +----------+--------+--------+--------+-----------------------+--------+  ICA Prox   45       11                smooth and heterogenous tortuous  +----------+--------+--------+--------+-----------------------+--------+  ICA Distal 51       19  tortuous  +----------+--------+--------+--------+-----------------------+--------+  ECA        41       8                                                   +----------+--------+--------+--------+-----------------------+--------+ +----------+--------+--------+--------+-------------------+             PSV cm/s EDV cm/s Describe Arm Pressure (mmHG)  +----------+--------+--------+--------+-------------------+  Subclavian 71                                              +----------+--------+--------+--------+-------------------+ +---------+--------+--+--------+-+---------+  Vertebral PSV cm/s 33 EDV cm/s 8 Antegrade  +---------+--------+--+--------+-+---------+   Summary: Right Carotid: Velocities in the right ICA are consistent with a 1-39% stenosis. Left Carotid: Velocities in the left ICA are consistent with a 1-39% stenosis. Vertebrals: Bilateral vertebral arteries demonstrate antegrade flow. *See table(s) above for measurements and  observations.  Electronically signed by Antony Contras MD on 05/07/2021 at 6:05:53 PM.    Final     Microbiology Recent Results (from the past 240 hour(s))  Resp Panel by RT-PCR (Flu A&B, Covid) Nasopharyngeal Swab     Status: None   Collection Time: 05/03/2021  8:27 PM   Specimen: Nasopharyngeal Swab; Nasopharyngeal(NP) swabs in vial transport medium  Result Value Ref Range Status   SARS Coronavirus 2 by RT PCR NEGATIVE NEGATIVE Final    Comment: (NOTE) SARS-CoV-2 target nucleic acids are NOT DETECTED.  The SARS-CoV-2 RNA is generally detectable in upper respiratory specimens during the acute phase of infection. The lowest concentration of SARS-CoV-2 viral copies this assay can detect is 138 copies/mL. A negative result does not preclude SARS-Cov-2 infection and should not be used as the sole basis for treatment or other patient management decisions. A negative result may occur with  improper specimen collection/handling, submission of specimen other than nasopharyngeal swab, presence of viral mutation(s) within the areas targeted by this assay, and inadequate number of viral copies(<138 copies/mL). A negative result must be combined with clinical observations, patient history, and epidemiological information. The expected result is Negative.  Fact Sheet for Patients:  EntrepreneurPulse.com.au  Fact Sheet for [facility] Providers:  IncredibleEmployment.be  This test is no t yet approved or cleared by the [place] FDA and  has been authorized for detection and/or diagnosis of SARS-CoV-2 by FDA under an Emergency Use Authorization (EUA). This EUA will remain  in effect (meaning this test can be used) for the duration of the COVID-19 declaration under Section 564(b)(1) of the Act, 21 U.S.C.section 360bbb-3(b)(1), unless the authorization is terminated  or revoked sooner.       Influenza A by PCR NEGATIVE NEGATIVE Final   Influenza B by PCR  NEGATIVE NEGATIVE Final    Comment: (NOTE) The Xpert Xpress SARS-CoV-2/FLU/RSV plus assay is intended as an aid in the diagnosis of influenza from Nasopharyngeal swab specimens and should not be used as a sole basis for treatment. Nasal washings and aspirates are unacceptable for Xpert Xpress SARS-CoV-2/FLU/RSV testing.  Fact Sheet for Patients: EntrepreneurPulse.com.au  Fact Sheet for [facility] Providers: IncredibleEmployment.be  This test is not yet approved or cleared by the [place] FDA and has been authorized for detection and/or diagnosis of SARS-CoV-2 by FDA under an Emergency Use  Authorization (EUA). This EUA will remain in effect (meaning this test can be used) for the duration of the COVID-19 declaration under Section 564(b)(1) of the Act, 21 U.S.C. section 360bbb-3(b)(1), unless the authorization is terminated or revoked.  Performed at [facility], [place] [address] [place], [place] [address]   MRSA Next Gen by PCR, Nasal     Status: None   Collection Time: 05/02/2021 11:00 PM   Specimen: Nasal Mucosa; Nasal Swab  Result Value Ref Range Status   MRSA by PCR Next Gen NOT DETECTED NOT DETECTED Final    Comment: (NOTE) The GeneXpert MRSA Assay (FDA approved for NASAL specimens only), is one component of a comprehensive MRSA colonization surveillance program. It is not intended to diagnose MRSA infection nor to guide or monitor treatment for MRSA infections. Test performance is not FDA approved in patients less than 62 years old. Performed at [facility], [place] [address], [place], [place] [address]     Lab Basic Metabolic Panel: Recent Labs  Lab 05/04/2021 2026 05/29/2021 2045 05/19/2021 2050 06/01/21 0549 06/01/21 0552 06/01/21 1616 06/01/21 2110 June 13, 2021 0305  NA 131* 134*   < > 135 136 135 136 133*  K 3.9 4.1   < > 2.9* 2.9* 3.6 4.0 4.7  CL 97* 99  --  100  --  101  --  95*  CO2 19*  --   --  14*  --  23  --   23  GLUCOSE 276* 254*  --  232*  --  96  --  89  BUN 14 15  --  24*  --  30*  --  34*  CREATININE 1.75* 1.60*  --  2.28*  --  2.62*  --  3.30*  CALCIUM 7.9*  --   --  8.1*  --  7.8*  --  7.3*  MG  --   --   --  1.3*  --  1.7  --  1.4*  PHOS  --   --   --  3.8  --   --   --  4.3   < > = values in this interval not displayed.   Liver Function Tests: Recent Labs  Lab 05/19/2021 2050  AST 66*  ALT 42  ALKPHOS 153*  BILITOT 0.7  PROT 5.1*  ALBUMIN 2.5*   No results for input(s): LIPASE, AMYLASE in the last 168 hours. No results for input(s): AMMONIA in the last 168 hours. CBC: Recent Labs  Lab 05/30/2021 2026 05/15/2021 2045 06/01/21 0152 06/01/21 0549 06/01/21 0552 06/01/21 2110 2021/06/13 0305 06/13/2021 0607  WBC 10.2  --   --  17.8*  --   --  5.9  --   HGB 10.5*   < > 12.6* 11.6* 11.6* 11.2* 11.0*  --   HCT 35.3*   < > 37.0* 34.7* 34.0* 33.0* 33.0*  --   MCV 94.4  --   --  86.1  --   --  84.2  --   PLT 97*  --   --  91*  --   --  51* 46*   < > = values in this interval not displayed.   Cardiac Enzymes: No results for input(s): CKTOTAL, CKMB, CKMBINDEX, TROPONINI in the last 168 hours. Sepsis Labs: Recent Labs  Lab 05/18/2021 2026 05/26/2021 2300 06/01/21 0110 06/01/21 0549 06/01/21 0942 Jun 13, 2021 0305  WBC 10.2  --   --  17.8*  --  5.9  LATICACIDVEN  --  >  9.0* >9.0*  --  >9.0*  --     Procedures/Operations     [employer] June 25, 2021, 2:57 PM

## 2021-06-30 NOTE — Progress Notes (Signed)
Patient was compassionately extubated to room air at 14:14 with family, RT & RN in the room.

## 2021-06-30 NOTE — Procedures (Addendum)
Patient Name: Norman Davis  MRN: [id number]  Epilepsy Attending: Charlsie Quest  Referring Physician/Provider: Duayne Cal, NP Duration: 06/24/2021 0148 to 06/10/2021  1037   Patient history: 81 year old male status post cardiac arrest.  EEG to evaluate for seizure.   Level of alertness: comatose   AEDs during EEG study: None   Technical aspects: This EEG study was done with scalp electrodes positioned according to the 10-20 International system of electrode placement. Electrical activity was acquired at a sampling rate of 500Hz  and reviewed with a high frequency filter of 70Hz  and a low frequency filter of 1Hz . EEG data were recorded continuously and digitally stored.    Description: EEG showed continuous generalized background suppression.  EEG was not reactive to tactile stimulation.  Hyperventilation and photic stimulation were not performed.      ABNORMALITY -Background suppression, generalized   IMPRESSION: This study is suggestive of profound diffuse encephalopathy, nonspecific etiology. No seizures or epileptiform discharges were seen throughout the recording.   Nazli Penn 

## 2021-06-30 DEATH — deceased
